# Patient Record
Sex: Female | Born: 1955 | Race: Black or African American | Hispanic: No | Marital: Married | Smoking: Never smoker
Health system: Southern US, Community
[De-identification: ages and names within clinical notes are randomized; demographics above are authoritative.]

## PROBLEM LIST (undated history)

## (undated) DIAGNOSIS — N189 Chronic kidney disease, unspecified: Secondary | ICD-10-CM

## (undated) DIAGNOSIS — Z9889 Other specified postprocedural states: Secondary | ICD-10-CM

## (undated) DIAGNOSIS — R112 Nausea with vomiting, unspecified: Secondary | ICD-10-CM

## (undated) DIAGNOSIS — R310 Gross hematuria: Secondary | ICD-10-CM

## (undated) DIAGNOSIS — Z8639 Personal history of other endocrine, nutritional and metabolic disease: Secondary | ICD-10-CM

## (undated) DIAGNOSIS — Z7682 Awaiting organ transplant status: Secondary | ICD-10-CM

## (undated) DIAGNOSIS — D631 Anemia in chronic kidney disease: Secondary | ICD-10-CM

## (undated) DIAGNOSIS — I2510 Atherosclerotic heart disease of native coronary artery without angina pectoris: Secondary | ICD-10-CM

## (undated) DIAGNOSIS — N200 Calculus of kidney: Secondary | ICD-10-CM

## (undated) DIAGNOSIS — Z992 Dependence on renal dialysis: Secondary | ICD-10-CM

## (undated) DIAGNOSIS — E890 Postprocedural hypothyroidism: Secondary | ICD-10-CM

## (undated) DIAGNOSIS — M1990 Unspecified osteoarthritis, unspecified site: Secondary | ICD-10-CM

## (undated) DIAGNOSIS — N281 Cyst of kidney, acquired: Secondary | ICD-10-CM

## (undated) DIAGNOSIS — N186 End stage renal disease: Secondary | ICD-10-CM

## (undated) DIAGNOSIS — N2581 Secondary hyperparathyroidism of renal origin: Secondary | ICD-10-CM

## (undated) DIAGNOSIS — I10 Essential (primary) hypertension: Secondary | ICD-10-CM

## (undated) HISTORY — PX: CARDIAC CATHETERIZATION: SHX172

## (undated) HISTORY — PX: INSERTION OF DIALYSIS CATHETER: SHX1324

## (undated) HISTORY — PX: NEPHRECTOMY TRANSPLANTED ORGAN: SUR880

---

## 1999-06-10 ENCOUNTER — Ambulatory Visit (HOSPITAL_COMMUNITY): Admission: RE | Admit: 1999-06-10 | Discharge: 1999-06-10 | Payer: Self-pay | Admitting: Emergency Medicine

## 1999-06-10 ENCOUNTER — Encounter: Payer: Self-pay | Admitting: Emergency Medicine

## 2000-05-03 ENCOUNTER — Other Ambulatory Visit: Admission: RE | Admit: 2000-05-03 | Discharge: 2000-05-03 | Payer: Self-pay | Admitting: *Deleted

## 2000-05-09 ENCOUNTER — Ambulatory Visit (HOSPITAL_COMMUNITY): Admission: RE | Admit: 2000-05-09 | Discharge: 2000-05-09 | Payer: Self-pay | Admitting: Emergency Medicine

## 2000-05-09 ENCOUNTER — Encounter: Payer: Self-pay | Admitting: Emergency Medicine

## 2000-06-08 ENCOUNTER — Ambulatory Visit (HOSPITAL_COMMUNITY): Admission: RE | Admit: 2000-06-08 | Discharge: 2000-06-08 | Payer: Self-pay | Admitting: Emergency Medicine

## 2000-06-08 ENCOUNTER — Encounter: Payer: Self-pay | Admitting: Emergency Medicine

## 2004-11-10 ENCOUNTER — Encounter: Admission: RE | Admit: 2004-11-10 | Discharge: 2004-11-10 | Payer: Self-pay | Admitting: Emergency Medicine

## 2004-11-14 ENCOUNTER — Encounter: Admission: RE | Admit: 2004-11-14 | Discharge: 2004-11-14 | Payer: Self-pay | Admitting: Emergency Medicine

## 2005-02-02 ENCOUNTER — Encounter: Admission: RE | Admit: 2005-02-02 | Discharge: 2005-02-02 | Payer: Self-pay | Admitting: Emergency Medicine

## 2005-09-14 ENCOUNTER — Encounter: Admission: RE | Admit: 2005-09-14 | Discharge: 2005-09-14 | Payer: Self-pay | Admitting: Emergency Medicine

## 2006-05-13 ENCOUNTER — Emergency Department (HOSPITAL_COMMUNITY): Admission: EM | Admit: 2006-05-13 | Discharge: 2006-05-13 | Payer: Self-pay | Admitting: Family Medicine

## 2007-04-01 ENCOUNTER — Encounter: Admission: RE | Admit: 2007-04-01 | Discharge: 2007-04-01 | Payer: Self-pay | Admitting: Emergency Medicine

## 2007-04-10 ENCOUNTER — Encounter: Admission: RE | Admit: 2007-04-10 | Discharge: 2007-04-10 | Payer: Self-pay | Admitting: Emergency Medicine

## 2007-07-27 ENCOUNTER — Emergency Department (HOSPITAL_COMMUNITY): Admission: EM | Admit: 2007-07-27 | Discharge: 2007-07-27 | Payer: Self-pay | Admitting: Emergency Medicine

## 2007-11-04 ENCOUNTER — Emergency Department (HOSPITAL_COMMUNITY): Admission: EM | Admit: 2007-11-04 | Discharge: 2007-11-04 | Payer: Self-pay | Admitting: Emergency Medicine

## 2008-06-22 ENCOUNTER — Emergency Department (HOSPITAL_COMMUNITY): Admission: EM | Admit: 2008-06-22 | Discharge: 2008-06-22 | Payer: Self-pay | Admitting: Nephrology

## 2008-11-30 ENCOUNTER — Encounter: Payer: Self-pay | Admitting: Gynecology

## 2008-11-30 ENCOUNTER — Ambulatory Visit: Payer: Self-pay | Admitting: Gynecology

## 2008-11-30 ENCOUNTER — Other Ambulatory Visit: Admission: RE | Admit: 2008-11-30 | Discharge: 2008-11-30 | Payer: Self-pay | Admitting: Gynecology

## 2008-12-01 ENCOUNTER — Encounter: Admission: RE | Admit: 2008-12-01 | Discharge: 2008-12-01 | Payer: Self-pay | Admitting: Family Medicine

## 2008-12-08 ENCOUNTER — Ambulatory Visit: Payer: Self-pay | Admitting: Gynecology

## 2008-12-15 ENCOUNTER — Ambulatory Visit: Payer: Self-pay | Admitting: Gynecology

## 2008-12-24 ENCOUNTER — Emergency Department (HOSPITAL_COMMUNITY): Admission: EM | Admit: 2008-12-24 | Discharge: 2008-12-24 | Payer: Self-pay | Admitting: Emergency Medicine

## 2009-03-17 ENCOUNTER — Ambulatory Visit: Payer: Self-pay | Admitting: Oncology

## 2009-04-13 ENCOUNTER — Other Ambulatory Visit: Admission: RE | Admit: 2009-04-13 | Discharge: 2009-04-13 | Payer: Self-pay | Admitting: Oncology

## 2009-04-13 ENCOUNTER — Encounter: Payer: Self-pay | Admitting: Oncology

## 2009-04-13 LAB — MORPHOLOGY - CHCC SATELLITE

## 2009-04-13 LAB — CBC WITH DIFFERENTIAL (CANCER CENTER ONLY)
EOS%: 2.9 % (ref 0.0–7.0)
HCT: 35.3 % (ref 34.8–46.6)
LYMPH#: 4.3 10*3/uL — ABNORMAL HIGH (ref 0.9–3.3)
LYMPH%: 47.5 % (ref 14.0–48.0)
MCH: 26.7 pg (ref 26.0–34.0)
MCV: 79 fL — ABNORMAL LOW (ref 81–101)
NEUT%: 44.3 % (ref 39.6–80.0)
Platelets: 399 10*3/uL (ref 145–400)
WBC: 9.1 10*3/uL (ref 3.9–10.0)

## 2009-04-13 LAB — CMP (CANCER CENTER ONLY)
ALT(SGPT): 15 U/L (ref 10–47)
CO2: 31 mEq/L (ref 18–33)
Calcium: 9.5 mg/dL (ref 8.0–10.3)
Total Bilirubin: 0.5 mg/dl (ref 0.20–1.60)

## 2009-04-14 LAB — COMPREHENSIVE METABOLIC PANEL
ALT: 10 U/L (ref 0–35)
AST: 14 U/L (ref 0–37)
BUN: 16 mg/dL (ref 6–23)
CO2: 23 mEq/L (ref 19–32)
Calcium: 10.3 mg/dL (ref 8.4–10.5)
Chloride: 103 mEq/L (ref 96–112)
Potassium: 4.5 mEq/L (ref 3.5–5.3)
Sodium: 141 mEq/L (ref 135–145)

## 2009-04-15 LAB — PROTEIN ELECTROPHORESIS, SERUM
Alpha-1-Globulin: 4.5 % (ref 2.9–4.9)
Beta 2: 7.3 % — ABNORMAL HIGH (ref 3.2–6.5)
Gamma Globulin: 19.4 % — ABNORMAL HIGH (ref 11.1–18.8)

## 2009-04-15 LAB — IRON AND TIBC
Iron: <10 ug/dL — ABNORMAL LOW (ref 42–145)
UIBC: 267 ug/dL

## 2009-04-15 LAB — FERRITIN: Ferritin: 59 ng/mL (ref 10–291)

## 2009-04-15 LAB — ERYTHROPOIETIN: Erythropoietin: 9.5 m[IU]/mL (ref 2.6–34.0)

## 2009-04-27 ENCOUNTER — Ambulatory Visit: Payer: Self-pay | Admitting: Oncology

## 2009-05-03 LAB — CBC WITH DIFFERENTIAL (CANCER CENTER ONLY)
EOS%: 1.8 % (ref 0.0–7.0)
Eosinophils Absolute: 0.2 10*3/uL (ref 0.0–0.5)
LYMPH#: 3.7 10*3/uL — ABNORMAL HIGH (ref 0.9–3.3)
LYMPH%: 44.5 % (ref 14.0–48.0)
MONO%: 6 % (ref 0.0–13.0)
NEUT#: 3.9 10*3/uL (ref 1.5–6.5)
NEUT%: 46.7 % (ref 39.6–80.0)
WBC: 8.4 10*3/uL (ref 3.9–10.0)

## 2009-05-03 LAB — TECHNOLOGIST REVIEW CHCC SATELLITE

## 2009-08-09 ENCOUNTER — Emergency Department (HOSPITAL_COMMUNITY): Admission: EM | Admit: 2009-08-09 | Discharge: 2009-08-09 | Payer: Self-pay | Admitting: Emergency Medicine

## 2009-08-19 ENCOUNTER — Ambulatory Visit: Payer: Self-pay | Admitting: Gynecology

## 2009-11-21 ENCOUNTER — Emergency Department (HOSPITAL_COMMUNITY): Admission: EM | Admit: 2009-11-21 | Discharge: 2009-11-21 | Payer: Self-pay | Admitting: Emergency Medicine

## 2009-11-25 ENCOUNTER — Ambulatory Visit: Payer: Self-pay | Admitting: Oncology

## 2009-11-30 LAB — CBC WITH DIFFERENTIAL (CANCER CENTER ONLY)
BASO#: 0.1 10*3/uL (ref 0.0–0.2)
EOS%: 3.3 % (ref 0.0–7.0)
Eosinophils Absolute: 0.4 10*3/uL (ref 0.0–0.5)
HCT: 37.2 % (ref 34.8–46.6)
HGB: 12.5 g/dL (ref 11.6–15.9)
LYMPH%: 51 % — ABNORMAL HIGH (ref 14.0–48.0)
MONO%: 5.5 % (ref 0.0–13.0)
NEUT%: 39.1 % — ABNORMAL LOW (ref 39.6–80.0)
RBC: 4.52 10*6/uL (ref 3.70–5.32)

## 2009-11-30 LAB — FERRITIN: Ferritin: 104 ng/mL (ref 10–291)

## 2010-03-10 ENCOUNTER — Ambulatory Visit
Admission: RE | Admit: 2010-03-10 | Discharge: 2010-03-10 | Payer: Self-pay | Source: Home / Self Care | Admitting: Orthopedic Surgery

## 2010-03-10 HISTORY — PX: WRIST ARTHROSCOPY W/ TRIANGULAR FIBROCARTILAGE REPAIR: SHX2670

## 2010-05-20 ENCOUNTER — Ambulatory Visit: Payer: Self-pay | Admitting: Oncology

## 2010-07-01 ENCOUNTER — Ambulatory Visit: Payer: Self-pay | Admitting: Oncology

## 2010-09-25 ENCOUNTER — Encounter: Payer: Self-pay | Admitting: Emergency Medicine

## 2010-11-20 LAB — POCT HEMOGLOBIN-HEMACUE: Hemoglobin: 14.1 g/dL (ref 12.0–15.0)

## 2010-12-07 ENCOUNTER — Other Ambulatory Visit: Payer: Self-pay | Admitting: Internal Medicine

## 2010-12-08 ENCOUNTER — Other Ambulatory Visit: Payer: Self-pay

## 2011-06-13 LAB — CBC
HCT: 33.7 — ABNORMAL LOW
Hemoglobin: 11.8 — ABNORMAL LOW
MCHC: 35
MCV: 79
RBC: 4.26
RDW: 13.7

## 2011-06-13 LAB — DIFFERENTIAL
Basophils Absolute: 0.1
Basophils Relative: 1
Eosinophils Relative: 3
Monocytes Absolute: 0.7
Monocytes Relative: 8

## 2011-06-13 LAB — COMPREHENSIVE METABOLIC PANEL
AST: 16
Albumin: 3.5
Alkaline Phosphatase: 94
CO2: 28
Glucose, Bld: 94
Total Bilirubin: 0.7

## 2011-06-13 LAB — URINALYSIS, ROUTINE W REFLEX MICROSCOPIC
Ketones, ur: NEGATIVE
Leukocytes, UA: NEGATIVE
Nitrite: NEGATIVE
Protein, ur: 30 — AB

## 2011-06-13 LAB — URINE MICROSCOPIC-ADD ON

## 2012-09-05 ENCOUNTER — Emergency Department (INDEPENDENT_AMBULATORY_CARE_PROVIDER_SITE_OTHER)
Admission: EM | Admit: 2012-09-05 | Discharge: 2012-09-05 | Disposition: A | Discharge status: Home / Self Care | Payer: 59 | Source: Home / Self Care

## 2012-09-05 ENCOUNTER — Encounter (HOSPITAL_COMMUNITY): Payer: Self-pay

## 2012-09-05 DIAGNOSIS — R0982 Postnasal drip: Secondary | ICD-10-CM

## 2012-09-05 DIAGNOSIS — J029 Acute pharyngitis, unspecified: Secondary | ICD-10-CM

## 2012-09-05 NOTE — ED Provider Notes (Signed)
History     CSN: [id number]  Arrival date & time 09/05/12  1135   None     Chief Complaint  Patient presents with  . Sore Throat    (Consider location/radiation/quality/duration/timing/severity/associated sxs/prior treatment) HPI Comments: 57 year old female presents with sore throat for one week. The pain is primarily the right aspect of her throat. She has a lot of clearing of the throat but does not perceive any significant PND. She denies fever or earache. Denies nasal congestion, shortness of breath or chest pain.   Past Medical History  Diagnosis Date  . Thyroid disease     History reviewed. No pertinent past surgical history.  History reviewed. No pertinent family history.  History  Substance Use Topics  . Smoking status: Not on file  . Smokeless tobacco: Not on file  . Alcohol Use:     OB History    Grav Para Term Preterm Abortions TAB SAB Ect Mult Living                  Review of Systems  Constitutional: Negative for fever, chills, activity change, appetite change and fatigue.  HENT: Positive for sore throat. Negative for congestion, facial swelling, rhinorrhea, trouble swallowing, neck pain, neck stiffness and postnasal drip.   Eyes: Negative.   Respiratory: Negative.  Negative for cough.   Cardiovascular: Negative.   Gastrointestinal: Negative.   Skin: Negative for pallor and rash.  Neurological: Negative.   Psychiatric/Behavioral: Negative.     Allergies  Aspirin  Home Medications   Current Outpatient Rx  Name  Route  Sig  Dispense  Refill  . THYROID 180 MG PO TABS   Oral   Take 180 mg by mouth daily.           BP 144/76  Pulse 62  Temp 98 F (36.7 C) (Oral)  Resp 14  SpO2 99%  Physical Exam  Nursing note and vitals reviewed. Constitutional: She is oriented to person, place, and time. She appears well-developed and well-nourished. No distress.  HENT:       Bilateral TMs are normal Oropharynx without erythema or edema. There  are bilateral moderately enlarged pink palatine tonsils that are not especially cryptic, erythematous or with exudates.  Neck: Normal range of motion. Neck supple.  Cardiovascular: Normal rate and regular rhythm.   Pulmonary/Chest: Effort normal and breath sounds normal. No respiratory distress. She has no wheezes. She has no rales.  Musculoskeletal: Normal range of motion. She exhibits no edema.  Lymphadenopathy:    She has no cervical adenopathy.  Neurological: She is alert and oriented to person, place, and time.  Skin: Skin is warm and dry. No rash noted.  Psychiatric: She has a normal mood and affect.    ED Course  Procedures (including critical care time)  Labs Reviewed - No data to display No results found.   1. Pharyngitis   2. PND (post-nasal drip)       MDM  Oropharynx is benign, airway widely patent. I suspect her pharyngitis is primarily due to PND greatest on the right side of her throat. Recommend Claritin 10 mg a day when necessary for drainage. May also take Tylenol as needed for discomfort. Drink plenty of fluids and stay well-hydrated. Cepacol lozenges when necessary sore throat. Recheck for new symptoms problems or worsening such as fever, chills or worsening pain or trouble swallowing or breathing.         Janne Napoleon, NP 09/05/12 1343

## 2012-09-05 NOTE — ED Notes (Signed)
C/o sore throat, R>L since 08-30-2012; NAD, minimal relief w OTC medications; throat minimally red, no uvula deviation on inspection

## 2012-09-05 NOTE — ED Provider Notes (Signed)
Medical screening examination/treatment/procedure(s) were performed by non-physician practitioner and as supervising physician I was immediately available for consultation/collaboration.  Philipp Deputy, M.D.   Harden Mo, MD 09/05/12 [phone]

## 2013-09-19 ENCOUNTER — Other Ambulatory Visit: Payer: Self-pay | Admitting: Internal Medicine

## 2013-09-19 DIAGNOSIS — Z1231 Encounter for screening mammogram for malignant neoplasm of breast: Secondary | ICD-10-CM

## 2013-10-20 ENCOUNTER — Encounter (HOSPITAL_COMMUNITY): Payer: Self-pay | Admitting: Emergency Medicine

## 2013-10-20 ENCOUNTER — Emergency Department (INDEPENDENT_AMBULATORY_CARE_PROVIDER_SITE_OTHER)
Admission: EM | Admit: 2013-10-20 | Discharge: 2013-10-20 | Disposition: A | Discharge status: Home / Self Care | Payer: Self-pay | Source: Home / Self Care | Attending: Emergency Medicine | Admitting: Emergency Medicine

## 2013-10-20 DIAGNOSIS — N289 Disorder of kidney and ureter, unspecified: Secondary | ICD-10-CM

## 2013-10-20 DIAGNOSIS — R319 Hematuria, unspecified: Secondary | ICD-10-CM

## 2013-10-20 LAB — POCT I-STAT, CHEM 8
BUN: 28 mg/dL — ABNORMAL HIGH (ref 6–23)
CHLORIDE: 104 meq/L (ref 96–112)
CREATININE: 1.6 mg/dL — AB (ref 0.50–1.10)
Calcium, Ion: 1.3 mmol/L — ABNORMAL HIGH (ref 1.12–1.23)
GLUCOSE: 90 mg/dL (ref 70–99)
HCT: 38 % (ref 36.0–46.0)
Hemoglobin: 12.9 g/dL (ref 12.0–15.0)
POTASSIUM: 4.8 meq/L (ref 3.7–5.3)
Sodium: 141 mEq/L (ref 137–147)
TCO2: 30 mmol/L (ref 0–100)

## 2013-10-20 LAB — URINE MICROSCOPIC-ADD ON

## 2013-10-20 LAB — POCT URINALYSIS DIP (DEVICE)
BILIRUBIN URINE: NEGATIVE
GLUCOSE, UA: NEGATIVE mg/dL
Ketones, ur: NEGATIVE mg/dL
Leukocytes, UA: NEGATIVE
Nitrite: NEGATIVE
Protein, ur: >=300 mg/dL — AB
SPECIFIC GRAVITY, URINE: 1.015 (ref 1.005–1.030)
UROBILINOGEN UA: 0.2 mg/dL (ref 0.0–1.0)
pH: 5.5 (ref 5.0–8.0)

## 2013-10-20 LAB — URINALYSIS, ROUTINE W REFLEX MICROSCOPIC
Bilirubin Urine: NEGATIVE
GLUCOSE, UA: NEGATIVE mg/dL
Ketones, ur: NEGATIVE mg/dL
LEUKOCYTES UA: NEGATIVE
Nitrite: NEGATIVE
PROTEIN: 100 mg/dL — AB
SPECIFIC GRAVITY, URINE: 1.013 (ref 1.005–1.030)
UROBILINOGEN UA: 0.2 mg/dL (ref 0.0–1.0)
pH: 5.5 (ref 5.0–8.0)

## 2013-10-20 MED ORDER — HYDROCODONE-ACETAMINOPHEN 5-325 MG PO TABS: 2.0000 | ORAL_TABLET | ORAL | Status: DC | PRN

## 2013-10-20 NOTE — ED Provider Notes (Signed)
CSN: [id number]     Arrival date & time 10/20/13  0801 History   First MD Initiated Contact with Patient 10/20/13 [phone]     Chief Complaint  Patient presents with  . Back Pain     (Consider location/radiation/quality/duration/timing/severity/associated sxs/prior Treatment) Patient is a 58 y.o. female presenting with back pain. The history is provided by the patient. No language interpreter was used.  Back Pain Location:  Lumbar spine Quality:  Aching Radiates to:  Does not radiate Pain severity:  Moderate Pain is:  Same all the time Onset quality:  Gradual Timing:  Constant Progression:  Worsening Chronicity:  New Relieved by:  Nothing Worsened by:  Nothing tried Ineffective treatments:  None tried Associated symptoms: abdominal pain    Pt complains of pain in right low back.   Past Medical History  Diagnosis Date  . Thyroid disease    History reviewed. No pertinent past surgical history. History reviewed. No pertinent family history. History  Substance Use Topics  . Smoking status: Never Smoker   . Smokeless tobacco: Not on file  . Alcohol Use: No   OB History   Grav Para Term Preterm Abortions TAB SAB Ect Mult Living                 Review of Systems  Gastrointestinal: Positive for abdominal pain.  Musculoskeletal: Positive for back pain.  All other systems reviewed and are negative.      Allergies  Aspirin  Home Medications   Current Outpatient Rx  Name  Route  Sig  Dispense  Refill  . thyroid (ARMOUR) 180 MG tablet   Oral   Take 180 mg by mouth daily.          BP 145/93  Pulse 70  Temp(Src) 97.8 F (36.6 C) (Oral)  Resp 18  SpO2 98% Physical Exam  Nursing note and vitals reviewed. Constitutional: She is oriented to person, place, and time. She appears well-developed and well-nourished.  HENT:  Head: Normocephalic.  Eyes: EOM are normal.  Neck: Normal range of motion.  Pulmonary/Chest: Effort normal and breath sounds normal.   Abdominal: She exhibits no distension. There is tenderness.  Musculoskeletal: Normal range of motion.  Neurological: She is alert and oriented to person, place, and time.  Skin: Skin is warm.  Psychiatric: She has a normal mood and affect.    ED Course  Procedures (including critical care time) Labs Review Labs Reviewed  POCT URINALYSIS DIP (DEVICE) - Abnormal; Notable for the following:    Hgb urine dipstick MODERATE (*)    Protein, ur >=300 (*)    All other components within normal limits   Imaging Review No results found.    MDM   Final diagnoses:  Hematuria  Renal insufficiency    Bun 28, creat 1.60  Elevated calcium.    Pt advised to see her primary Md.   Pt advised to schedule to see urology for evaluation.   Pt given rx for hydrocodone for pain.      [place], PA-C 10/20/13 1058

## 2013-10-20 NOTE — ED Provider Notes (Signed)
Medical screening examination/treatment/procedure(s) were performed by non-physician practitioner and as supervising physician I was immediately available for consultation/collaboration.  Philipp Deputy, M.D.  Harden Mo, MD 10/20/13 [phone]

## 2013-10-20 NOTE — Discharge Instructions (Signed)
Hematuria, Adult °Hematuria is blood in your urine. It can be caused by a bladder infection, kidney infection, prostate infection, kidney stone, or cancer of your urinary tract. Infections can usually be treated with medicine, and a kidney stone usually will pass through your urine. If neither of these is the cause of your hematuria, further workup to find out the reason may be needed. °It is very important that you tell your health care provider about any blood you see in your urine, even if the blood stops without treatment or happens without causing pain. Blood in your urine that happens and then stops and then happens again can be a symptom of a very serious condition. Also, pain is not a symptom in the initial stages of many urinary cancers. °HOME CARE INSTRUCTIONS  °· Drink lots of fluid, 3 4 quarts a day. If you have been diagnosed with an infection, cranberry juice is especially recommended, in addition to large amounts of water. °· Avoid caffeine, tea, and carbonated beverages, because they tend to irritate the bladder. °· Avoid alcohol because it may irritate the prostate. °· Only take over-the-counter or prescription medicines for pain, discomfort, or fever as directed by your health care provider. °· If you have been diagnosed with a kidney stone, follow your health care provider's instructions regarding straining your urine to catch the stone. °· Empty your bladder often. Avoid holding urine for long periods of time. °· After a bowel movement, women should cleanse front to back. Use each tissue only once. °· Empty your bladder before and after sexual intercourse if you are a female. °SEEK MEDICAL CARE IF: °You develop back pain, fever, a feeling of sickness in your stomach (nausea), or vomiting or if your symptoms are not better in 3 days. Return sooner if you are getting worse. °SEEK IMMEDIATE MEDICAL CARE IF:  °· You have a persistent fever, with a temperature of 101.8°F (38.8°C) or greater. °· You  develop severe vomiting and are unable to keep the medicine down. °· You develop severe back or abdominal pain despite taking your medicines. °· You begin passing a large amount of blood or clots in your urine. °· You feel extremely weak or faint, or you pass out. °MAKE SURE YOU:  °· Understand these instructions. °· Will watch your condition. °· Will get help right away if you are not doing well or get worse. °Document Released: 08/21/2005 Document Revised: 06/11/2013 Document Reviewed: 04/21/2013 °[employer]® Patient Information ©2014 [employer]. ° °

## 2013-10-20 NOTE — ED Notes (Signed)
Pt  Reports  Symptoms  Of   Low  Back  Pain    X  sev  Days     denys  Any  Injury   Ambulated  To  Room  -  Is  Sitting  Upright on  Exam table  Speaking  In  Complete    sentances            Sitting upright on  Exam table  In  Complete  sentances

## 2013-10-21 LAB — URINE CULTURE

## 2014-04-16 ENCOUNTER — Other Ambulatory Visit (HOSPITAL_COMMUNITY): Payer: Self-pay | Admitting: Nephrology

## 2014-04-16 DIAGNOSIS — R809 Proteinuria, unspecified: Secondary | ICD-10-CM

## 2014-04-16 DIAGNOSIS — N183 Chronic kidney disease, stage 3 unspecified: Secondary | ICD-10-CM

## 2014-04-21 ENCOUNTER — Encounter (HOSPITAL_COMMUNITY): Payer: Self-pay | Admitting: Pharmacy Technician

## 2014-04-27 ENCOUNTER — Ambulatory Visit (HOSPITAL_COMMUNITY): Payer: Self-pay

## 2014-04-28 ENCOUNTER — Other Ambulatory Visit: Payer: Self-pay | Admitting: Radiology

## 2014-05-01 ENCOUNTER — Ambulatory Visit (HOSPITAL_COMMUNITY): Admission: RE | Admit: 2014-05-01 | Payer: Self-pay | Source: Ambulatory Visit

## 2014-06-27 ENCOUNTER — Emergency Department (HOSPITAL_COMMUNITY)
Admission: EM | Admit: 2014-06-27 | Discharge: 2014-06-27 | Disposition: A | Discharge status: Home / Self Care | Payer: Self-pay | Attending: Emergency Medicine | Admitting: Emergency Medicine

## 2014-06-27 ENCOUNTER — Encounter (HOSPITAL_COMMUNITY): Payer: Self-pay | Admitting: Emergency Medicine

## 2014-06-27 DIAGNOSIS — N183 Chronic kidney disease, stage 3 unspecified: Secondary | ICD-10-CM

## 2014-06-27 DIAGNOSIS — I160 Hypertensive urgency: Secondary | ICD-10-CM

## 2014-06-27 DIAGNOSIS — Z79899 Other long term (current) drug therapy: Secondary | ICD-10-CM | POA: Insufficient documentation

## 2014-06-27 DIAGNOSIS — E079 Disorder of thyroid, unspecified: Secondary | ICD-10-CM | POA: Insufficient documentation

## 2014-06-27 DIAGNOSIS — I129 Hypertensive chronic kidney disease with stage 1 through stage 4 chronic kidney disease, or unspecified chronic kidney disease: Secondary | ICD-10-CM | POA: Insufficient documentation

## 2014-06-27 LAB — I-STAT CHEM 8, ED
BUN: 27 mg/dL — ABNORMAL HIGH (ref 6–23)
CHLORIDE: 109 meq/L (ref 96–112)
Calcium, Ion: 1.09 mmol/L — ABNORMAL LOW (ref 1.12–1.23)
Creatinine, Ser: 1.8 mg/dL — ABNORMAL HIGH (ref 0.50–1.10)
Glucose, Bld: 90 mg/dL (ref 70–99)
HEMATOCRIT: 37 % (ref 36.0–46.0)
Hemoglobin: 12.6 g/dL (ref 12.0–15.0)
POTASSIUM: 4.9 meq/L (ref 3.7–5.3)
SODIUM: 138 meq/L (ref 137–147)
TCO2: 22 mmol/L (ref 0–100)

## 2014-06-27 MED ORDER — AMLODIPINE BESYLATE 5 MG PO TABS
5.0000 mg | ORAL_TABLET | Freq: Once | ORAL | Status: AC
  Administered 2014-06-27: 5 mg via ORAL
  Filled 2014-06-27: qty 1

## 2014-06-27 MED ORDER — SODIUM CHLORIDE 0.9 % IV BOLUS (SEPSIS)
1000.0000 mL | Freq: Once | INTRAVENOUS | Status: AC
  Administered 2014-06-27: 1000 mL via INTRAVENOUS

## 2014-06-27 MED ORDER — METOCLOPRAMIDE HCL 5 MG/ML IJ SOLN
10.0000 mg | Freq: Once | INTRAMUSCULAR | Status: AC
  Administered 2014-06-27: 10 mg via INTRAVENOUS
  Filled 2014-06-27: qty 2

## 2014-06-27 MED ORDER — AMLODIPINE BESYLATE 5 MG PO TABS: 5.0000 mg | ORAL_TABLET | Freq: Every day | ORAL | Status: DC

## 2014-06-27 MED ORDER — MAGNESIUM SULFATE 40 MG/ML IJ SOLN
2.0000 g | Freq: Once | INTRAMUSCULAR | Status: AC
Start: 2014-06-27 — End: 2014-06-27
  Administered 2014-06-27: 2 g via INTRAVENOUS
  Filled 2014-06-27: qty 50

## 2014-06-27 NOTE — ED Notes (Signed)
She states that some 4 months ago she was found to have abnormal kidney function per lab work.  She was subsequently referred to nephrology, who has been tracking her creatinine, which has been rising.  She further states that she is awaiting her b/p to stabilize so she may undergo a kidney biopsy.  She is here today with c/o headache "that wouldn't go away this time".  She is in no distress and is oriented x 4 with clear speech.  PERRLA at 7mm.

## 2014-06-27 NOTE — ED Provider Notes (Signed)
CSN: [id number]     Arrival date & time 06/27/14  1637 History   First MD Initiated Contact with Patient 06/27/14 1857     Chief Complaint  Patient presents with  . Headache     (Consider location/radiation/quality/duration/timing/severity/associated sxs/prior Treatment) HPI Comments: Pt comes in with cc of elevated BP and headaches. Pt reports that her headache - described as posterior pressure, is moderate but constant. She has had these headaches in the past, they usually go away, but today they haven't. She was advised not to take any pain meds, thus she hasn't. No nausea, vomiting, visual complains, seizures, altered mental status, loss of consciousness, new weakness, or numbness, no gait instability. Pt's BP is elevated. She was started on Bp meds 3 weeks ago, and is taking losartan. Pt has no numbness, tingling, weakness, chest pain, headaches, visual complains.   Patient is a 58 y.o. female presenting with headaches. The history is provided by the patient.  Headache Associated symptoms: no abdominal pain, no nausea, no neck pain and no vomiting     Past Medical History  Diagnosis Date  . Thyroid disease   . Creatinine elevation    No past surgical history on file. No family history on file. History  Substance Use Topics  . Smoking status: Never Smoker   . Smokeless tobacco: Not on file  . Alcohol Use: No   OB History   Grav Para Term Preterm Abortions TAB SAB Ect Mult Living                 Review of Systems  Constitutional: Negative for activity change.  Respiratory: Negative for shortness of breath.   Cardiovascular: Negative for chest pain.  Gastrointestinal: Negative for nausea, vomiting and abdominal pain.  Genitourinary: Negative for dysuria.  Musculoskeletal: Negative for neck pain.  Neurological: Positive for headaches.      Allergies  Aspirin and Tetracyclines & related  Home Medications   Prior to Admission medications   Medication Sig Start  Date End Date Taking? Authorizing Provider  B Complex Vitamins (B-COMPLEX/B-12 PO) Take 1 tablet by mouth daily.   Yes Historical Provider, MD  cholecalciferol (VITAMIN D) 1000 UNITS tablet Take 1,000 Units by mouth daily.   Yes Historical Provider, MD  ferrous sulfate 325 (65 FE) MG tablet Take 325 mg by mouth every other day.   Yes Historical Provider, MD  levothyroxine (SYNTHROID, LEVOTHROID) 175 MCG tablet Take 175 mcg by mouth daily before breakfast.   Yes Historical Provider, MD  losartan (COZAAR) 50 MG tablet Take 50 mg by mouth every evening. At 10pm.   Yes Historical Provider, MD  vitamin C (ASCORBIC ACID) 500 MG tablet Take 500 mg by mouth every other day.   Yes Historical Provider, MD  amLODipine (NORVASC) 5 MG tablet Take 1 tablet (5 mg total) by mouth daily. 06/27/14   Davian Wollenberg, MD   BP 204/95  Pulse 96  Temp(Src) 98.7 F (37.1 C) (Oral)  Resp 20  SpO2 97% Physical Exam  Nursing note and vitals reviewed. Constitutional: She is oriented to person, place, and time. She appears well-developed and well-nourished.  HENT:  Head: Normocephalic and atraumatic.  Eyes: EOM are normal. Pupils are equal, round, and reactive to light.  Neck: Neck supple. No JVD present.  Cardiovascular: Normal rate, regular rhythm and normal heart sounds.   Pulmonary/Chest: Effort normal. No respiratory distress.  Abdominal: Soft. She exhibits no distension. There is no tenderness. There is no rebound and no guarding.  Neurological:  She is alert and oriented to person, place, and time. No cranial nerve deficit. Coordination normal.  Skin: Skin is warm and dry.    ED Course  Procedures (including critical care time) Labs Review Labs Reviewed  I-STAT CHEM 8, ED - Abnormal; Notable for the following:    BUN 27 (*)    Creatinine, Ser 1.80 (*)    Calcium, Ion 1.09 (*)    All other components within normal limits    Imaging Review No results found.   EKG Interpretation   Date/Time:   Saturday June 27 2014 17:26:12 EDT Ventricular Rate:  73 PR Interval:  154 QRS Duration: 80 QT Interval:  378 QTC Calculation: 416 R Axis:   21 Text Interpretation:  Sinus rhythm Nonspecific T abnrm, anterolateral  leads Minimal ST elevation, inferior leads Confirmed by Kathrynn Humble, MD,  Thelma Comp [phone]) on 06/27/2014 7:32:05 PM      MDM   Final diagnoses:  CKD (chronic kidney disease) stage 3, GFR 30-59 ml/min  Hypertensive urgency    Pt comes in with headaches. Intermittent, but now constant. Normal neuro exam. Pt got magnesium and reglan - and her headache resolved. BP is elevated. New diagnosis. Likely not optimized. Spoke with Dr. Humphrey Rolls, and he thinks adding low dose amlodipine is probably a good idea, until pt sees her PCP. Pt informed about this, she will try to see her nephrologist soon.  Varney Biles, MD 06/27/14 [phone]

## 2014-06-27 NOTE — Discharge Instructions (Signed)
We saw you in the ER for the headaches and the elevated BP. All the results in the ER are normal, except for chronic kidney disease.  The workup in the ER is not complete, and is limited to screening for life threatening and emergent conditions only, so please see a primary care doctor for further evaluation. Start the BP meds as requested. Keep a log of BP. See your doctor as soon as possible.   Chronic Kidney Disease Chronic kidney disease occurs when the kidneys are damaged over a long period. The kidneys are two organs that lie on either side of the spine between the middle of the back and the front of the abdomen. The kidneys:   Remove wastes and extra water from the blood.   Produce important hormones. These help keep bones strong, regulate blood pressure, and help create red blood cells.   Balance the fluids and chemicals in the blood and tissues. A small amount of kidney damage may not cause problems, but a large amount of damage may make it difficult or impossible for the kidneys to work the way they should. If steps are not taken to slow down the kidney damage or stop it from getting worse, the kidneys may stop working permanently. Most of the time, chronic kidney disease does not go away. However, it can often be controlled, and those with the disease can usually live normal lives. CAUSES  The most common causes of chronic kidney disease are diabetes and high blood pressure (hypertension). Chronic kidney disease may also be caused by:   Diseases that cause the kidneys' filters to become inflamed.   Diseases that affect the immune system.   Genetic diseases.   Medicines that damage the kidneys, such as anti-inflammatory medicines.  Poisoning or exposure to toxic substances.   A reoccurring kidney or urinary infection.   A problem with urine flow. This may be caused by:   Cancer.   Kidney stones.   An enlarged prostate in males. SIGNS AND SYMPTOMS  Because  the kidney damage in chronic kidney disease occurs slowly, symptoms develop slowly and may not be obvious until the kidney damage becomes severe. A person may have a kidney disease for years without showing any symptoms. Symptoms can include:   Swelling (edema) of the legs, ankles, or feet.   Tiredness (lethargy).   Nausea or vomiting.   Confusion.   Problems with urination, such as:   Decreased urine production.   Frequent urination, especially at night.   Frequent accidents in children who are potty trained.   Muscle twitches and cramps.   Shortness of breath.  Weakness.   Persistent itchiness.   Loss of appetite.  Metallic taste in the mouth.  Trouble sleeping.  Slowed development in children.  Short stature in children. DIAGNOSIS  Chronic kidney disease may be detected and diagnosed by tests, including blood, urine, imaging, or kidney biopsy tests.  TREATMENT  Most chronic kidney diseases cannot be cured. Treatment usually involves relieving symptoms and preventing or slowing the progression of the disease. Treatment may include:   A special diet. You may need to avoid alcohol and foods thatare salty and high in potassium.   Medicines. These may:   Lower blood pressure.   Relieve anemia.   Relieve swelling.   Protect the bones. HOME CARE INSTRUCTIONS   Follow your prescribed diet.   Take medicines only as directed by your health care provider. Do not take any new medicines (prescription, over-the-counter, or nutritional supplements)  unless approved by your health care provider. Many medicines can worsen your kidney damage or need to have the dose adjusted.   Quit smoking if you smoke. Talk to your health care provider about a smoking cessation program.   Keep all follow-up visits as directed by your health care provider. SEEK IMMEDIATE MEDICAL CARE IF:  Your symptoms get worse or you develop new symptoms.   You develop symptoms  of end-stage kidney disease. These include:   Headaches.   Abnormally dark or light skin.   Numbness in the hands or feet.   Easy bruising.   Frequent hiccups.   Menstruation stops.   You have a fever.   You have decreased urine production.   You havepain or bleeding when urinating. MAKE SURE YOU:  Understand these instructions.  Will watch your condition.  Will get help right away if you are not doing well or get worse. FOR MORE INFORMATION   American Association of Kidney Patients: BombTimer.gl  National Kidney Foundation: www.kidney.[place]: https://mathis.com/  [facility]: www.lifeoptions.org and www.kidneyschool.org Document Released: 05/30/2008 Document Revised: 01/05/2014 Document Reviewed: 04/19/2012 [facility] Patient Information 2015 [place], [place]. This information is not intended to replace advice given to you by your health care provider. Make sure you discuss any questions you have with your health care provider.  Hypertension Hypertension, commonly called high blood pressure, is when the force of blood pumping through your arteries is too strong. Your arteries are the blood vessels that carry blood from your heart throughout your body. A blood pressure reading consists of a higher number over a lower number, such as 110/72. The higher number (systolic) is the pressure inside your arteries when your heart pumps. The lower number (diastolic) is the pressure inside your arteries when your heart relaxes. Ideally you want your blood pressure below 120/80. Hypertension forces your heart to work harder to pump blood. Your arteries may become narrow or stiff. Having hypertension puts you at risk for heart disease, stroke, and other problems.  RISK FACTORS Some risk factors for high blood pressure are controllable. Others are not.  Risk factors you cannot control include:   Race. You may be at higher risk if you are  African American.  Age. Risk increases with age.  Gender. Men are at higher risk than women before age 32 years. After age 33, women are at higher risk than men. Risk factors you can control include:  Not getting enough exercise or physical activity.  Being overweight.  Getting too much fat, sugar, calories, or salt in your diet.  Drinking too much alcohol. SIGNS AND SYMPTOMS Hypertension does not usually cause signs or symptoms. Extremely high blood pressure (hypertensive crisis) may cause headache, anxiety, shortness of breath, and nosebleed. DIAGNOSIS  To check if you have hypertension, your health care provider will measure your blood pressure while you are seated, with your arm held at the level of your heart. It should be measured at least twice using the same arm. Certain conditions can cause a difference in blood pressure between your right and left arms. A blood pressure reading that is higher than normal on one occasion does not mean that you need treatment. If one blood pressure reading is high, ask your health care provider about having it checked again. TREATMENT  Treating high blood pressure includes making lifestyle changes and possibly taking medicine. Living a healthy lifestyle can help lower high blood pressure. You may need to change some of your habits.  Lifestyle changes may include:  Following the DASH diet. This diet is high in fruits, vegetables, and whole grains. It is low in salt, red meat, and added sugars.  Getting at least 2 hours of brisk physical activity every week.  Losing weight if necessary.  Not smoking.  Limiting alcoholic beverages.  Learning ways to reduce stress. If lifestyle changes are not enough to get your blood pressure under control, your health care provider may prescribe medicine. You may need to take more than one. Work closely with your health care provider to understand the risks and benefits. HOME CARE INSTRUCTIONS  Have your  blood pressure rechecked as directed by your health care provider.   Take medicines only as directed by your health care provider. Follow the directions carefully. Blood pressure medicines must be taken as prescribed. The medicine does not work as well when you skip doses. Skipping doses also puts you at risk for problems.   Do not smoke.   Monitor your blood pressure at home as directed by your health care provider. SEEK MEDICAL CARE IF:   You think you are having a reaction to medicines taken.  You have recurrent headaches or feel dizzy.  You have swelling in your ankles.  You have trouble with your vision. SEEK IMMEDIATE MEDICAL CARE IF:  You develop a severe headache or confusion.  You have unusual weakness, numbness, or feel faint.  You have severe chest or abdominal pain.  You vomit repeatedly.  You have trouble breathing. MAKE SURE YOU:   Understand these instructions.  Will watch your condition.  Will get help right away if you are not doing well or get worse. Document Released: 08/21/2005 Document Revised: 01/05/2014 Document Reviewed: 06/13/2013 [facility] Patient Information 2015 [place], [place]. This information is not intended to replace advice given to you by your health care provider. Make sure you discuss any questions you have with your health care provider.  How to Take Your Blood Pressure HOW DO I GET A BLOOD PRESSURE MACHINE?  You can buy an electronic home blood pressure machine at your local pharmacy. Insurance will sometimes cover the cost if you have a prescription.  Ask your doctor what type of machine is best for you. There are different machines for your arm and your wrist.  If you decide to buy a machine to check your blood pressure on your arm, first check the size of your arm so you can buy the right size cuff. To check the size of your arm:   Use a measuring tape that shows both inches and centimeters.   Wrap the measuring tape around  the upper-middle part of your arm. You may need someone to help you measure.   Write down your arm measurement in both inches and centimeters.   To measure your blood pressure correctly, it is important to have the right size cuff.   If your arm is up to 13 inches (up to 34 centimeters), get an adult cuff size.  If your arm is 13 to 17 inches (35 to 44 centimeters), get a large adult cuff size.    If your arm is 17 to 20 inches (45 to 52 centimeters), get an adult thigh cuff.  WHAT DO THE NUMBERS MEAN?   There are two numbers that make up your blood pressure. For example: 120/80.  The first number (120 in our example) is called the "systolic pressure." It is a measure of the pressure in your blood vessels when your heart is  pumping blood.  The second number (80 in our example) is called the "diastolic pressure." It is a measure of the pressure in your blood vessels when your heart is resting between beats.  Your doctor will tell you what your blood pressure should be. WHAT SHOULD I DO BEFORE I CHECK MY BLOOD PRESSURE?   Try to rest or relax for at least 30 minutes before you check your blood pressure.  Do not smoke.  Do not have any drinks with caffeine, such as:  Soda.  Coffee.  Tea.  Check your blood pressure in a quiet room.  Sit down and stretch out your arm on a table. Keep your arm at about the level of your heart. Let your arm relax.  Make sure that your legs are not crossed. HOW DO I CHECK MY BLOOD PRESSURE?  Follow the directions that came with your machine.  Make sure you remove any tight-fighting clothing from your arm or wrist. Wrap the cuff around your upper arm or wrist. You should be able to fit a finger between the cuff and your arm. If you cannot fit a finger between the cuff and your arm, it is too tight and should be removed and rewrapped.  Some units require you to manually pump up the arm cuff.  Automatic units inflate the cuff when you press a  button.  Cuff deflation is automatic in both models.  After the cuff is inflated, the unit measures your blood pressure and pulse. The readings are shown on a monitor. Hold still and breathe normally while the cuff is inflated.  Getting a reading takes less than a minute.  Some models store readings in a memory. Some provide a printout of readings. If your machine does not store your readings, keep a written record.  Take readings with you to your next visit with your doctor. Document Released: 08/03/2008 Document Revised: 01/05/2014 Document Reviewed: 10/16/2013 [facility] Patient Information 2015 [place], [place]. This information is not intended to replace advice given to you by your health care provider. Make sure you discuss any questions you have with your health care provider.

## 2015-02-15 ENCOUNTER — Other Ambulatory Visit: Payer: Self-pay

## 2015-02-15 DIAGNOSIS — Z1231 Encounter for screening mammogram for malignant neoplasm of breast: Secondary | ICD-10-CM

## 2015-03-18 ENCOUNTER — Ambulatory Visit
Admission: RE | Admit: 2015-03-18 | Discharge: 2015-03-18 | Disposition: A | Discharge status: Home / Self Care | Payer: 59 | Source: Ambulatory Visit

## 2015-03-18 DIAGNOSIS — Z1231 Encounter for screening mammogram for malignant neoplasm of breast: Secondary | ICD-10-CM

## 2015-03-22 ENCOUNTER — Other Ambulatory Visit: Payer: Self-pay | Admitting: Nurse Practitioner

## 2015-03-22 DIAGNOSIS — R928 Other abnormal and inconclusive findings on diagnostic imaging of breast: Secondary | ICD-10-CM

## 2015-03-25 NOTE — Discharge Instructions (Signed)

## 2015-03-26 ENCOUNTER — Encounter (HOSPITAL_COMMUNITY)
Admission: RE | Admit: 2015-03-26 | Discharge: 2015-03-26 | Disposition: A | Discharge status: Home / Self Care | Payer: 59 | Source: Ambulatory Visit | Attending: Nephrology | Admitting: Nephrology

## 2015-03-26 DIAGNOSIS — N184 Chronic kidney disease, stage 4 (severe): Secondary | ICD-10-CM | POA: Diagnosis not present

## 2015-03-26 DIAGNOSIS — Z79899 Other long term (current) drug therapy: Secondary | ICD-10-CM | POA: Diagnosis not present

## 2015-03-26 DIAGNOSIS — Z5181 Encounter for therapeutic drug level monitoring: Secondary | ICD-10-CM | POA: Insufficient documentation

## 2015-03-26 DIAGNOSIS — D631 Anemia in chronic kidney disease: Secondary | ICD-10-CM | POA: Insufficient documentation

## 2015-03-26 LAB — POCT HEMOGLOBIN-HEMACUE: Hemoglobin: 8.4 g/dL — ABNORMAL LOW (ref 12.0–15.0)

## 2015-03-26 MED ORDER — EPOETIN ALFA 10000 UNIT/ML IJ SOLN
INTRAMUSCULAR | Status: AC
  Filled 2015-03-26: qty 1

## 2015-03-26 MED ORDER — EPOETIN ALFA 10000 UNIT/ML IJ SOLN
5000.0000 [IU] | INTRAMUSCULAR | Status: DC
  Administered 2015-03-26: 5000 [IU] via SUBCUTANEOUS

## 2015-03-29 ENCOUNTER — Other Ambulatory Visit: Payer: 59

## 2015-03-30 ENCOUNTER — Other Ambulatory Visit: Payer: Self-pay | Admitting: Internal Medicine

## 2015-03-30 DIAGNOSIS — R928 Other abnormal and inconclusive findings on diagnostic imaging of breast: Secondary | ICD-10-CM

## 2015-04-02 ENCOUNTER — Encounter (HOSPITAL_COMMUNITY)
Admission: RE | Admit: 2015-04-02 | Discharge: 2015-04-02 | Disposition: A | Discharge status: Home / Self Care | Payer: 59 | Source: Ambulatory Visit | Attending: Nephrology | Admitting: Nephrology

## 2015-04-02 DIAGNOSIS — N184 Chronic kidney disease, stage 4 (severe): Secondary | ICD-10-CM | POA: Diagnosis not present

## 2015-04-02 LAB — POCT HEMOGLOBIN-HEMACUE: Hemoglobin: 8.7 g/dL — ABNORMAL LOW (ref 12.0–15.0)

## 2015-04-02 MED ORDER — EPOETIN ALFA 10000 UNIT/ML IJ SOLN
5000.0000 [IU] | INTRAMUSCULAR | Status: DC
  Administered 2015-04-02: 5000 [IU] via SUBCUTANEOUS

## 2015-04-02 MED ORDER — EPOETIN ALFA 10000 UNIT/ML IJ SOLN
INTRAMUSCULAR | Status: AC
  Administered 2015-04-02: 5000 [IU] via SUBCUTANEOUS
  Filled 2015-04-02: qty 1

## 2015-04-05 ENCOUNTER — Other Ambulatory Visit: Payer: Self-pay | Admitting: Internal Medicine

## 2015-04-05 ENCOUNTER — Ambulatory Visit
Admission: RE | Admit: 2015-04-05 | Discharge: 2015-04-05 | Disposition: A | Discharge status: Home / Self Care | Payer: 59 | Source: Ambulatory Visit | Attending: Nurse Practitioner | Admitting: Nurse Practitioner

## 2015-04-05 DIAGNOSIS — R928 Other abnormal and inconclusive findings on diagnostic imaging of breast: Secondary | ICD-10-CM

## 2015-04-09 ENCOUNTER — Encounter (HOSPITAL_COMMUNITY)
Admission: RE | Admit: 2015-04-09 | Discharge: 2015-04-09 | Disposition: A | Discharge status: Home / Self Care | Payer: 59 | Source: Ambulatory Visit | Attending: Nephrology | Admitting: Nephrology

## 2015-04-09 DIAGNOSIS — Z79899 Other long term (current) drug therapy: Secondary | ICD-10-CM | POA: Diagnosis not present

## 2015-04-09 DIAGNOSIS — D631 Anemia in chronic kidney disease: Secondary | ICD-10-CM | POA: Diagnosis not present

## 2015-04-09 DIAGNOSIS — Z5181 Encounter for therapeutic drug level monitoring: Secondary | ICD-10-CM | POA: Insufficient documentation

## 2015-04-09 DIAGNOSIS — N184 Chronic kidney disease, stage 4 (severe): Secondary | ICD-10-CM | POA: Insufficient documentation

## 2015-04-09 LAB — POCT HEMOGLOBIN-HEMACUE: HEMOGLOBIN: 8.8 g/dL — AB (ref 12.0–15.0)

## 2015-04-09 MED ORDER — EPOETIN ALFA 10000 UNIT/ML IJ SOLN
5000.0000 [IU] | INTRAMUSCULAR | Status: DC
Start: 2015-04-09 — End: 2015-04-10
  Administered 2015-04-09: 5000 [IU] via SUBCUTANEOUS

## 2015-04-09 MED ORDER — EPOETIN ALFA 10000 UNIT/ML IJ SOLN
INTRAMUSCULAR | Status: AC
  Filled 2015-04-09: qty 1

## 2015-04-15 ENCOUNTER — Encounter (HOSPITAL_COMMUNITY)
Admission: RE | Admit: 2015-04-15 | Discharge: 2015-04-15 | Disposition: A | Discharge status: Home / Self Care | Payer: 59 | Source: Ambulatory Visit | Attending: Nephrology | Admitting: Nephrology

## 2015-04-15 DIAGNOSIS — N184 Chronic kidney disease, stage 4 (severe): Secondary | ICD-10-CM | POA: Diagnosis not present

## 2015-04-15 LAB — POCT HEMOGLOBIN-HEMACUE: Hemoglobin: 9 g/dL — ABNORMAL LOW (ref 12.0–15.0)

## 2015-04-15 MED ORDER — EPOETIN ALFA 10000 UNIT/ML IJ SOLN
5000.0000 [IU] | INTRAMUSCULAR | Status: DC
  Administered 2015-04-15: 5000 [IU] via SUBCUTANEOUS

## 2015-04-15 MED ORDER — EPOETIN ALFA 10000 UNIT/ML IJ SOLN
INTRAMUSCULAR | Status: AC
  Filled 2015-04-15: qty 1

## 2015-04-22 ENCOUNTER — Encounter (HOSPITAL_COMMUNITY)
Admission: RE | Admit: 2015-04-22 | Discharge: 2015-04-22 | Disposition: A | Discharge status: Home / Self Care | Payer: 59 | Source: Ambulatory Visit | Attending: Nephrology | Admitting: Nephrology

## 2015-04-22 DIAGNOSIS — N184 Chronic kidney disease, stage 4 (severe): Secondary | ICD-10-CM | POA: Diagnosis not present

## 2015-04-22 LAB — POCT HEMOGLOBIN-HEMACUE: Hemoglobin: 9.4 g/dL — ABNORMAL LOW (ref 12.0–15.0)

## 2015-04-22 MED ORDER — EPOETIN ALFA 10000 UNIT/ML IJ SOLN
5000.0000 [IU] | INTRAMUSCULAR | Status: DC
  Administered 2015-04-22: 5000 [IU] via SUBCUTANEOUS

## 2015-04-22 MED ORDER — EPOETIN ALFA 10000 UNIT/ML IJ SOLN
INTRAMUSCULAR | Status: AC
  Filled 2015-04-22: qty 1

## 2015-04-29 ENCOUNTER — Encounter (HOSPITAL_COMMUNITY)
Admission: RE | Admit: 2015-04-29 | Discharge: 2015-04-29 | Disposition: A | Discharge status: Home / Self Care | Payer: 59 | Source: Ambulatory Visit | Attending: Nephrology | Admitting: Nephrology

## 2015-04-29 DIAGNOSIS — N184 Chronic kidney disease, stage 4 (severe): Secondary | ICD-10-CM | POA: Diagnosis not present

## 2015-04-29 LAB — IRON AND TIBC
IRON: 34 ug/dL (ref 28–170)
Saturation Ratios: 14 % (ref 10.4–31.8)
TIBC: 241 ug/dL — AB (ref 250–450)
UIBC: 207 ug/dL

## 2015-04-29 LAB — FERRITIN: Ferritin: 118 ng/mL (ref 11–307)

## 2015-04-29 MED ORDER — EPOETIN ALFA 10000 UNIT/ML IJ SOLN
INTRAMUSCULAR | Status: AC
  Filled 2015-04-29: qty 1

## 2015-04-29 MED ORDER — EPOETIN ALFA 10000 UNIT/ML IJ SOLN
5000.0000 [IU] | INTRAMUSCULAR | Status: DC
  Administered 2015-04-29: 5000 [IU] via SUBCUTANEOUS

## 2015-04-30 LAB — POCT HEMOGLOBIN-HEMACUE: Hemoglobin: 9.9 g/dL — ABNORMAL LOW (ref 12.0–15.0)

## 2015-05-06 ENCOUNTER — Encounter (HOSPITAL_COMMUNITY)
Admission: RE | Admit: 2015-05-06 | Discharge: 2015-05-06 | Disposition: A | Discharge status: Home / Self Care | Payer: 59 | Source: Ambulatory Visit | Attending: Nephrology | Admitting: Nephrology

## 2015-05-06 DIAGNOSIS — D631 Anemia in chronic kidney disease: Secondary | ICD-10-CM | POA: Insufficient documentation

## 2015-05-06 DIAGNOSIS — Z79899 Other long term (current) drug therapy: Secondary | ICD-10-CM | POA: Insufficient documentation

## 2015-05-06 DIAGNOSIS — Z5181 Encounter for therapeutic drug level monitoring: Secondary | ICD-10-CM | POA: Diagnosis not present

## 2015-05-06 DIAGNOSIS — N184 Chronic kidney disease, stage 4 (severe): Secondary | ICD-10-CM | POA: Diagnosis present

## 2015-05-06 DIAGNOSIS — D509 Iron deficiency anemia, unspecified: Secondary | ICD-10-CM | POA: Diagnosis not present

## 2015-05-06 MED ORDER — EPOETIN ALFA 10000 UNIT/ML IJ SOLN
INTRAMUSCULAR | Status: AC
  Filled 2015-05-06: qty 1

## 2015-05-06 MED ORDER — EPOETIN ALFA 10000 UNIT/ML IJ SOLN
5000.0000 [IU] | INTRAMUSCULAR | Status: DC
  Administered 2015-05-06: 5000 [IU] via SUBCUTANEOUS

## 2015-05-07 LAB — POCT HEMOGLOBIN-HEMACUE: Hemoglobin: 10 g/dL — ABNORMAL LOW (ref 12.0–15.0)

## 2015-05-13 ENCOUNTER — Encounter (HOSPITAL_COMMUNITY)
Admission: RE | Admit: 2015-05-13 | Discharge: 2015-05-13 | Disposition: A | Discharge status: Home / Self Care | Payer: 59 | Source: Ambulatory Visit | Attending: Nephrology | Admitting: Nephrology

## 2015-05-13 DIAGNOSIS — N184 Chronic kidney disease, stage 4 (severe): Secondary | ICD-10-CM | POA: Diagnosis not present

## 2015-05-13 LAB — POCT HEMOGLOBIN-HEMACUE: HEMOGLOBIN: 10.5 g/dL — AB (ref 12.0–15.0)

## 2015-05-13 MED ORDER — EPOETIN ALFA 10000 UNIT/ML IJ SOLN
5000.0000 [IU] | INTRAMUSCULAR | Status: DC
  Administered 2015-05-13: 5000 [IU] via SUBCUTANEOUS

## 2015-05-13 MED ORDER — EPOETIN ALFA 10000 UNIT/ML IJ SOLN
INTRAMUSCULAR | Status: AC
  Filled 2015-05-13: qty 1

## 2015-05-20 ENCOUNTER — Encounter (HOSPITAL_COMMUNITY)
Admission: RE | Admit: 2015-05-20 | Discharge: 2015-05-20 | Disposition: A | Discharge status: Home / Self Care | Payer: 59 | Source: Ambulatory Visit | Attending: Nephrology | Admitting: Nephrology

## 2015-05-20 DIAGNOSIS — N184 Chronic kidney disease, stage 4 (severe): Secondary | ICD-10-CM | POA: Diagnosis not present

## 2015-05-20 LAB — POCT HEMOGLOBIN-HEMACUE: HEMOGLOBIN: 10.6 g/dL — AB (ref 12.0–15.0)

## 2015-05-20 MED ORDER — EPOETIN ALFA 10000 UNIT/ML IJ SOLN
5000.0000 [IU] | INTRAMUSCULAR | Status: DC
  Administered 2015-05-20: 5000 [IU] via SUBCUTANEOUS

## 2015-05-20 MED ORDER — EPOETIN ALFA 10000 UNIT/ML IJ SOLN
INTRAMUSCULAR | Status: AC
  Filled 2015-05-20: qty 1

## 2015-05-27 ENCOUNTER — Encounter (HOSPITAL_COMMUNITY)
Admission: RE | Admit: 2015-05-27 | Discharge: 2015-05-27 | Disposition: A | Discharge status: Home / Self Care | Payer: 59 | Source: Ambulatory Visit | Attending: Nephrology | Admitting: Nephrology

## 2015-05-27 DIAGNOSIS — N184 Chronic kidney disease, stage 4 (severe): Secondary | ICD-10-CM | POA: Diagnosis not present

## 2015-05-27 LAB — IRON AND TIBC
Iron: 21 ug/dL — ABNORMAL LOW (ref 28–170)
Saturation Ratios: 9 % — ABNORMAL LOW (ref 10.4–31.8)
TIBC: 223 ug/dL — AB (ref 250–450)
UIBC: 202 ug/dL

## 2015-05-27 LAB — POCT HEMOGLOBIN-HEMACUE: HEMOGLOBIN: 10.1 g/dL — AB (ref 12.0–15.0)

## 2015-05-27 LAB — FERRITIN: Ferritin: 104 ng/mL (ref 11–307)

## 2015-05-27 MED ORDER — EPOETIN ALFA 10000 UNIT/ML IJ SOLN
INTRAMUSCULAR | Status: AC
  Filled 2015-05-27: qty 1

## 2015-05-27 MED ORDER — EPOETIN ALFA 10000 UNIT/ML IJ SOLN
5000.0000 [IU] | INTRAMUSCULAR | Status: DC
  Administered 2015-05-27: 5000 [IU] via SUBCUTANEOUS

## 2015-06-02 ENCOUNTER — Other Ambulatory Visit (HOSPITAL_COMMUNITY): Payer: Self-pay | Admitting: *Deleted

## 2015-06-03 ENCOUNTER — Encounter (HOSPITAL_COMMUNITY)
Admission: RE | Admit: 2015-06-03 | Discharge: 2015-06-03 | Disposition: A | Discharge status: Home / Self Care | Payer: 59 | Source: Ambulatory Visit | Attending: Nephrology | Admitting: Nephrology

## 2015-06-03 DIAGNOSIS — N184 Chronic kidney disease, stage 4 (severe): Secondary | ICD-10-CM | POA: Diagnosis not present

## 2015-06-03 LAB — POCT HEMOGLOBIN-HEMACUE: Hemoglobin: 11 g/dL — ABNORMAL LOW (ref 12.0–15.0)

## 2015-06-03 MED ORDER — EPOETIN ALFA 10000 UNIT/ML IJ SOLN
INTRAMUSCULAR | Status: AC
  Filled 2015-06-03: qty 1

## 2015-06-03 MED ORDER — EPOETIN ALFA 10000 UNIT/ML IJ SOLN
5000.0000 [IU] | INTRAMUSCULAR | Status: DC
  Administered 2015-06-03: 5000 [IU] via SUBCUTANEOUS

## 2015-06-03 MED ORDER — SODIUM CHLORIDE 0.9 % IV SOLN
510.0000 mg | INTRAVENOUS | Status: DC
  Administered 2015-06-03: 510 mg via INTRAVENOUS
  Filled 2015-06-03: qty 17

## 2015-06-10 ENCOUNTER — Encounter (HOSPITAL_COMMUNITY)
Admission: RE | Admit: 2015-06-10 | Discharge: 2015-06-10 | Disposition: A | Discharge status: Home / Self Care | Payer: MEDICARE | Source: Ambulatory Visit | Attending: Nephrology | Admitting: Nephrology

## 2015-06-10 ENCOUNTER — Other Ambulatory Visit: Payer: Self-pay | Admitting: Obstetrics & Gynecology

## 2015-06-10 DIAGNOSIS — D631 Anemia in chronic kidney disease: Secondary | ICD-10-CM | POA: Diagnosis not present

## 2015-06-10 DIAGNOSIS — Z5181 Encounter for therapeutic drug level monitoring: Secondary | ICD-10-CM | POA: Diagnosis not present

## 2015-06-10 DIAGNOSIS — N184 Chronic kidney disease, stage 4 (severe): Secondary | ICD-10-CM | POA: Insufficient documentation

## 2015-06-10 DIAGNOSIS — Z124 Encounter for screening for malignant neoplasm of cervix: Secondary | ICD-10-CM | POA: Diagnosis not present

## 2015-06-10 DIAGNOSIS — Z01419 Encounter for gynecological examination (general) (routine) without abnormal findings: Secondary | ICD-10-CM | POA: Diagnosis not present

## 2015-06-10 DIAGNOSIS — Z79899 Other long term (current) drug therapy: Secondary | ICD-10-CM | POA: Insufficient documentation

## 2015-06-10 DIAGNOSIS — D509 Iron deficiency anemia, unspecified: Secondary | ICD-10-CM | POA: Diagnosis not present

## 2015-06-10 DIAGNOSIS — Z6829 Body mass index (BMI) 29.0-29.9, adult: Secondary | ICD-10-CM | POA: Diagnosis not present

## 2015-06-10 LAB — POCT HEMOGLOBIN-HEMACUE: HEMOGLOBIN: 10.8 g/dL — AB (ref 12.0–15.0)

## 2015-06-10 MED ORDER — EPOETIN ALFA 10000 UNIT/ML IJ SOLN: 5000.0000 [IU] | INTRAMUSCULAR | Status: DC

## 2015-06-10 MED ORDER — EPOETIN ALFA 10000 UNIT/ML IJ SOLN
INTRAMUSCULAR | Status: AC
  Administered 2015-06-10: 10000 [IU] via SUBCUTANEOUS
  Filled 2015-06-10: qty 1

## 2015-06-10 MED ORDER — SODIUM CHLORIDE 0.9 % IV SOLN
510.0000 mg | INTRAVENOUS | Status: AC
  Administered 2015-06-10: 510 mg via INTRAVENOUS
  Filled 2015-06-10: qty 17

## 2015-06-11 LAB — CYTOLOGY - PAP

## 2015-06-17 ENCOUNTER — Encounter (HOSPITAL_COMMUNITY): Payer: 59

## 2015-06-17 DIAGNOSIS — E0500 Thyrotoxicosis with diffuse goiter without thyrotoxic crisis or storm: Secondary | ICD-10-CM | POA: Insufficient documentation

## 2015-06-21 ENCOUNTER — Other Ambulatory Visit (HOSPITAL_COMMUNITY): Payer: Self-pay | Admitting: *Deleted

## 2015-06-21 ENCOUNTER — Encounter (HOSPITAL_COMMUNITY)
Admission: RE | Admit: 2015-06-21 | Discharge: 2015-06-21 | Disposition: A | Discharge status: Home / Self Care | Payer: MEDICARE | Source: Ambulatory Visit | Attending: Nephrology | Admitting: Nephrology

## 2015-06-21 DIAGNOSIS — Z5181 Encounter for therapeutic drug level monitoring: Secondary | ICD-10-CM | POA: Diagnosis not present

## 2015-06-21 DIAGNOSIS — N184 Chronic kidney disease, stage 4 (severe): Secondary | ICD-10-CM | POA: Diagnosis not present

## 2015-06-21 DIAGNOSIS — Z79899 Other long term (current) drug therapy: Secondary | ICD-10-CM | POA: Diagnosis not present

## 2015-06-21 DIAGNOSIS — D631 Anemia in chronic kidney disease: Secondary | ICD-10-CM | POA: Insufficient documentation

## 2015-06-21 LAB — FERRITIN: FERRITIN: 524 ng/mL — AB (ref 11–307)

## 2015-06-21 LAB — IRON AND TIBC
Iron: 62 ug/dL (ref 28–170)
SATURATION RATIOS: 30 % (ref 10.4–31.8)
TIBC: 207 ug/dL — ABNORMAL LOW (ref 250–450)
UIBC: 145 ug/dL

## 2015-06-21 LAB — POCT HEMOGLOBIN-HEMACUE: Hemoglobin: 10.9 g/dL — ABNORMAL LOW (ref 12.0–15.0)

## 2015-06-21 MED ORDER — EPOETIN ALFA 10000 UNIT/ML IJ SOLN
INTRAMUSCULAR | Status: AC
  Filled 2015-06-21: qty 1

## 2015-06-21 MED ORDER — EPOETIN ALFA 10000 UNIT/ML IJ SOLN
5000.0000 [IU] | INTRAMUSCULAR | Status: DC
  Administered 2015-06-21: 5000 [IU] via SUBCUTANEOUS

## 2015-06-24 ENCOUNTER — Emergency Department (HOSPITAL_COMMUNITY)
Admission: EM | Admit: 2015-06-24 | Discharge: 2015-06-25 | Discharge status: Against Medical Advice | Payer: MEDICARE | Attending: Emergency Medicine | Admitting: Emergency Medicine

## 2015-06-24 ENCOUNTER — Encounter (HOSPITAL_COMMUNITY): Payer: Self-pay | Admitting: Emergency Medicine

## 2015-06-24 DIAGNOSIS — R030 Elevated blood-pressure reading, without diagnosis of hypertension: Secondary | ICD-10-CM | POA: Diagnosis not present

## 2015-06-24 DIAGNOSIS — N186 End stage renal disease: Secondary | ICD-10-CM | POA: Diagnosis not present

## 2015-06-24 DIAGNOSIS — Z992 Dependence on renal dialysis: Secondary | ICD-10-CM | POA: Insufficient documentation

## 2015-06-24 HISTORY — DX: End stage renal disease: Z99.2

## 2015-06-24 HISTORY — DX: End stage renal disease: N18.6

## 2015-06-24 LAB — COMPREHENSIVE METABOLIC PANEL
ALBUMIN: 4 g/dL (ref 3.5–5.0)
ALT: <5 U/L — ABNORMAL LOW (ref 14–54)
ANION GAP: 11 (ref 5–15)
AST: 10 U/L — AB (ref 15–41)
Alkaline Phosphatase: 75 U/L (ref 38–126)
BUN: 57 mg/dL — AB (ref 6–20)
CHLORIDE: 110 mmol/L (ref 101–111)
CO2: 21 mmol/L — AB (ref 22–32)
Calcium: 9.6 mg/dL (ref 8.9–10.3)
Creatinine, Ser: 6.93 mg/dL — ABNORMAL HIGH (ref 0.44–1.00)
GFR calc Af Amer: 7 mL/min — ABNORMAL LOW (ref >60)
GFR calc non Af Amer: 6 mL/min — ABNORMAL LOW (ref >60)
GLUCOSE: 105 mg/dL — AB (ref 65–99)
POTASSIUM: 4.5 mmol/L (ref 3.5–5.1)
SODIUM: 142 mmol/L (ref 135–145)
TOTAL PROTEIN: 7.5 g/dL (ref 6.5–8.1)
Total Bilirubin: 0.5 mg/dL (ref 0.3–1.2)

## 2015-06-24 LAB — CBC WITH DIFFERENTIAL/PLATELET
BASOS ABS: 0.1 10*3/uL (ref 0.0–0.1)
Basophils Relative: 1 %
EOS PCT: 2 %
Eosinophils Absolute: 0.2 10*3/uL (ref 0.0–0.7)
HEMATOCRIT: 35.7 % — AB (ref 36.0–46.0)
Hemoglobin: 11.3 g/dL — ABNORMAL LOW (ref 12.0–15.0)
LYMPHS ABS: 2.7 10*3/uL (ref 0.7–4.0)
LYMPHS PCT: 23 %
MCH: 26 pg (ref 26.0–34.0)
MCHC: 31.7 g/dL (ref 30.0–36.0)
MCV: 82.3 fL (ref 78.0–100.0)
MONO ABS: 0.9 10*3/uL (ref 0.1–1.0)
MONOS PCT: 7 %
NEUTROS ABS: 7.8 10*3/uL — AB (ref 1.7–7.7)
Neutrophils Relative %: 67 %
Platelets: 354 10*3/uL (ref 150–400)
RBC: 4.34 MIL/uL (ref 3.87–5.11)
RDW: 16.9 % — AB (ref 11.5–15.5)
WBC: 11.6 10*3/uL — ABNORMAL HIGH (ref 4.0–10.5)

## 2015-06-24 NOTE — ED Notes (Signed)
Patient is complaining of dry mouth. Patient had a line put in for peritoneal dialysis yesterday. Patient saying blood pressure is high and has not gone down. The family called the MD that did the surgery at [facility] and his assistant stated if they feel that it is an emergency come to the ED.

## 2015-06-25 DIAGNOSIS — E875 Hyperkalemia: Secondary | ICD-10-CM | POA: Diagnosis not present

## 2015-06-25 DIAGNOSIS — R8299 Other abnormal findings in urine: Secondary | ICD-10-CM | POA: Diagnosis not present

## 2015-06-25 DIAGNOSIS — E784 Other hyperlipidemia: Secondary | ICD-10-CM | POA: Diagnosis not present

## 2015-06-25 DIAGNOSIS — D509 Iron deficiency anemia, unspecified: Secondary | ICD-10-CM | POA: Diagnosis not present

## 2015-06-25 DIAGNOSIS — N2589 Other disorders resulting from impaired renal tubular function: Secondary | ICD-10-CM | POA: Diagnosis not present

## 2015-06-25 DIAGNOSIS — N2581 Secondary hyperparathyroidism of renal origin: Secondary | ICD-10-CM | POA: Diagnosis not present

## 2015-06-25 DIAGNOSIS — D631 Anemia in chronic kidney disease: Secondary | ICD-10-CM | POA: Diagnosis not present

## 2015-06-25 DIAGNOSIS — K769 Liver disease, unspecified: Secondary | ICD-10-CM | POA: Diagnosis not present

## 2015-06-25 DIAGNOSIS — N186 End stage renal disease: Secondary | ICD-10-CM | POA: Diagnosis not present

## 2015-06-25 NOTE — ED Notes (Signed)
Patient was tired of waiting and her blood pressure went down. So they signed out AMA.

## 2015-06-28 DIAGNOSIS — N2581 Secondary hyperparathyroidism of renal origin: Secondary | ICD-10-CM | POA: Diagnosis not present

## 2015-06-28 DIAGNOSIS — N2589 Other disorders resulting from impaired renal tubular function: Secondary | ICD-10-CM | POA: Diagnosis not present

## 2015-06-28 DIAGNOSIS — D509 Iron deficiency anemia, unspecified: Secondary | ICD-10-CM | POA: Diagnosis not present

## 2015-06-28 DIAGNOSIS — D631 Anemia in chronic kidney disease: Secondary | ICD-10-CM | POA: Diagnosis not present

## 2015-06-28 DIAGNOSIS — N186 End stage renal disease: Secondary | ICD-10-CM | POA: Diagnosis not present

## 2015-06-28 DIAGNOSIS — E875 Hyperkalemia: Secondary | ICD-10-CM | POA: Diagnosis not present

## 2015-06-29 ENCOUNTER — Other Ambulatory Visit (HOSPITAL_COMMUNITY): Payer: Self-pay

## 2015-06-29 DIAGNOSIS — Z23 Encounter for immunization: Secondary | ICD-10-CM | POA: Diagnosis not present

## 2015-06-29 DIAGNOSIS — D509 Iron deficiency anemia, unspecified: Secondary | ICD-10-CM | POA: Diagnosis not present

## 2015-06-29 DIAGNOSIS — N186 End stage renal disease: Secondary | ICD-10-CM | POA: Diagnosis not present

## 2015-06-30 ENCOUNTER — Inpatient Hospital Stay (HOSPITAL_COMMUNITY): Admission: RE | Admit: 2015-06-30 | Payer: 59 | Source: Ambulatory Visit

## 2015-06-30 DIAGNOSIS — Z23 Encounter for immunization: Secondary | ICD-10-CM | POA: Diagnosis not present

## 2015-06-30 DIAGNOSIS — N186 End stage renal disease: Secondary | ICD-10-CM | POA: Diagnosis not present

## 2015-06-30 DIAGNOSIS — D509 Iron deficiency anemia, unspecified: Secondary | ICD-10-CM | POA: Diagnosis not present

## 2015-07-01 DIAGNOSIS — N186 End stage renal disease: Secondary | ICD-10-CM | POA: Diagnosis not present

## 2015-07-01 DIAGNOSIS — D509 Iron deficiency anemia, unspecified: Secondary | ICD-10-CM | POA: Diagnosis not present

## 2015-07-01 DIAGNOSIS — Z23 Encounter for immunization: Secondary | ICD-10-CM | POA: Diagnosis not present

## 2015-07-02 DIAGNOSIS — N186 End stage renal disease: Secondary | ICD-10-CM | POA: Diagnosis not present

## 2015-07-02 DIAGNOSIS — D509 Iron deficiency anemia, unspecified: Secondary | ICD-10-CM | POA: Diagnosis not present

## 2015-07-02 DIAGNOSIS — Z23 Encounter for immunization: Secondary | ICD-10-CM | POA: Diagnosis not present

## 2015-07-05 DIAGNOSIS — Z23 Encounter for immunization: Secondary | ICD-10-CM | POA: Diagnosis not present

## 2015-07-05 DIAGNOSIS — N186 End stage renal disease: Secondary | ICD-10-CM | POA: Diagnosis not present

## 2015-07-05 DIAGNOSIS — D509 Iron deficiency anemia, unspecified: Secondary | ICD-10-CM | POA: Diagnosis not present

## 2015-07-06 DIAGNOSIS — Z23 Encounter for immunization: Secondary | ICD-10-CM | POA: Diagnosis not present

## 2015-07-06 DIAGNOSIS — Z79899 Other long term (current) drug therapy: Secondary | ICD-10-CM | POA: Diagnosis not present

## 2015-07-06 DIAGNOSIS — N186 End stage renal disease: Secondary | ICD-10-CM | POA: Diagnosis not present

## 2015-07-06 DIAGNOSIS — D509 Iron deficiency anemia, unspecified: Secondary | ICD-10-CM | POA: Diagnosis not present

## 2015-07-06 DIAGNOSIS — K769 Liver disease, unspecified: Secondary | ICD-10-CM | POA: Diagnosis not present

## 2015-07-06 DIAGNOSIS — D631 Anemia in chronic kidney disease: Secondary | ICD-10-CM | POA: Diagnosis not present

## 2015-07-06 DIAGNOSIS — E440 Moderate protein-calorie malnutrition: Secondary | ICD-10-CM | POA: Diagnosis not present

## 2015-07-07 DIAGNOSIS — Z23 Encounter for immunization: Secondary | ICD-10-CM | POA: Diagnosis not present

## 2015-07-07 DIAGNOSIS — D631 Anemia in chronic kidney disease: Secondary | ICD-10-CM | POA: Diagnosis not present

## 2015-07-07 DIAGNOSIS — N186 End stage renal disease: Secondary | ICD-10-CM | POA: Diagnosis not present

## 2015-07-07 DIAGNOSIS — K769 Liver disease, unspecified: Secondary | ICD-10-CM | POA: Diagnosis not present

## 2015-07-07 DIAGNOSIS — Z79899 Other long term (current) drug therapy: Secondary | ICD-10-CM | POA: Diagnosis not present

## 2015-07-07 DIAGNOSIS — D509 Iron deficiency anemia, unspecified: Secondary | ICD-10-CM | POA: Diagnosis not present

## 2015-07-07 DIAGNOSIS — R8299 Other abnormal findings in urine: Secondary | ICD-10-CM | POA: Diagnosis not present

## 2015-07-08 DIAGNOSIS — N186 End stage renal disease: Secondary | ICD-10-CM | POA: Diagnosis not present

## 2015-07-08 DIAGNOSIS — K769 Liver disease, unspecified: Secondary | ICD-10-CM | POA: Diagnosis not present

## 2015-07-08 DIAGNOSIS — Z79899 Other long term (current) drug therapy: Secondary | ICD-10-CM | POA: Diagnosis not present

## 2015-07-08 DIAGNOSIS — D509 Iron deficiency anemia, unspecified: Secondary | ICD-10-CM | POA: Diagnosis not present

## 2015-07-08 DIAGNOSIS — Z23 Encounter for immunization: Secondary | ICD-10-CM | POA: Diagnosis not present

## 2015-07-08 DIAGNOSIS — D631 Anemia in chronic kidney disease: Secondary | ICD-10-CM | POA: Diagnosis not present

## 2015-07-09 DIAGNOSIS — D631 Anemia in chronic kidney disease: Secondary | ICD-10-CM | POA: Diagnosis not present

## 2015-07-09 DIAGNOSIS — Z23 Encounter for immunization: Secondary | ICD-10-CM | POA: Diagnosis not present

## 2015-07-09 DIAGNOSIS — K769 Liver disease, unspecified: Secondary | ICD-10-CM | POA: Diagnosis not present

## 2015-07-09 DIAGNOSIS — D509 Iron deficiency anemia, unspecified: Secondary | ICD-10-CM | POA: Diagnosis not present

## 2015-07-09 DIAGNOSIS — N186 End stage renal disease: Secondary | ICD-10-CM | POA: Diagnosis not present

## 2015-07-09 DIAGNOSIS — Z79899 Other long term (current) drug therapy: Secondary | ICD-10-CM | POA: Diagnosis not present

## 2015-07-12 DIAGNOSIS — D509 Iron deficiency anemia, unspecified: Secondary | ICD-10-CM | POA: Diagnosis not present

## 2015-07-12 DIAGNOSIS — N186 End stage renal disease: Secondary | ICD-10-CM | POA: Diagnosis not present

## 2015-07-12 DIAGNOSIS — Z23 Encounter for immunization: Secondary | ICD-10-CM | POA: Diagnosis not present

## 2015-07-12 DIAGNOSIS — K769 Liver disease, unspecified: Secondary | ICD-10-CM | POA: Diagnosis not present

## 2015-07-12 DIAGNOSIS — Z79899 Other long term (current) drug therapy: Secondary | ICD-10-CM | POA: Diagnosis not present

## 2015-07-12 DIAGNOSIS — D631 Anemia in chronic kidney disease: Secondary | ICD-10-CM | POA: Diagnosis not present

## 2015-07-13 DIAGNOSIS — Z79899 Other long term (current) drug therapy: Secondary | ICD-10-CM | POA: Diagnosis not present

## 2015-07-13 DIAGNOSIS — N186 End stage renal disease: Secondary | ICD-10-CM | POA: Diagnosis not present

## 2015-07-13 DIAGNOSIS — Z23 Encounter for immunization: Secondary | ICD-10-CM | POA: Diagnosis not present

## 2015-07-13 DIAGNOSIS — K769 Liver disease, unspecified: Secondary | ICD-10-CM | POA: Diagnosis not present

## 2015-07-13 DIAGNOSIS — D631 Anemia in chronic kidney disease: Secondary | ICD-10-CM | POA: Diagnosis not present

## 2015-07-13 DIAGNOSIS — D509 Iron deficiency anemia, unspecified: Secondary | ICD-10-CM | POA: Diagnosis not present

## 2015-07-14 DIAGNOSIS — K769 Liver disease, unspecified: Secondary | ICD-10-CM | POA: Diagnosis not present

## 2015-07-14 DIAGNOSIS — D509 Iron deficiency anemia, unspecified: Secondary | ICD-10-CM | POA: Diagnosis not present

## 2015-07-14 DIAGNOSIS — Z79899 Other long term (current) drug therapy: Secondary | ICD-10-CM | POA: Diagnosis not present

## 2015-07-14 DIAGNOSIS — Z23 Encounter for immunization: Secondary | ICD-10-CM | POA: Diagnosis not present

## 2015-07-14 DIAGNOSIS — D631 Anemia in chronic kidney disease: Secondary | ICD-10-CM | POA: Diagnosis not present

## 2015-07-14 DIAGNOSIS — N186 End stage renal disease: Secondary | ICD-10-CM | POA: Diagnosis not present

## 2015-07-15 DIAGNOSIS — Z23 Encounter for immunization: Secondary | ICD-10-CM | POA: Diagnosis not present

## 2015-07-15 DIAGNOSIS — D509 Iron deficiency anemia, unspecified: Secondary | ICD-10-CM | POA: Diagnosis not present

## 2015-07-15 DIAGNOSIS — N186 End stage renal disease: Secondary | ICD-10-CM | POA: Diagnosis not present

## 2015-07-15 DIAGNOSIS — D631 Anemia in chronic kidney disease: Secondary | ICD-10-CM | POA: Diagnosis not present

## 2015-07-15 DIAGNOSIS — K769 Liver disease, unspecified: Secondary | ICD-10-CM | POA: Diagnosis not present

## 2015-07-15 DIAGNOSIS — Z79899 Other long term (current) drug therapy: Secondary | ICD-10-CM | POA: Diagnosis not present

## 2015-07-16 DIAGNOSIS — D631 Anemia in chronic kidney disease: Secondary | ICD-10-CM | POA: Diagnosis not present

## 2015-07-16 DIAGNOSIS — N186 End stage renal disease: Secondary | ICD-10-CM | POA: Diagnosis not present

## 2015-07-16 DIAGNOSIS — Z79899 Other long term (current) drug therapy: Secondary | ICD-10-CM | POA: Diagnosis not present

## 2015-07-16 DIAGNOSIS — Z23 Encounter for immunization: Secondary | ICD-10-CM | POA: Diagnosis not present

## 2015-07-16 DIAGNOSIS — D509 Iron deficiency anemia, unspecified: Secondary | ICD-10-CM | POA: Diagnosis not present

## 2015-07-16 DIAGNOSIS — K769 Liver disease, unspecified: Secondary | ICD-10-CM | POA: Diagnosis not present

## 2015-07-19 DIAGNOSIS — N186 End stage renal disease: Secondary | ICD-10-CM | POA: Diagnosis not present

## 2015-07-19 DIAGNOSIS — K769 Liver disease, unspecified: Secondary | ICD-10-CM | POA: Diagnosis not present

## 2015-07-19 DIAGNOSIS — Z79899 Other long term (current) drug therapy: Secondary | ICD-10-CM | POA: Diagnosis not present

## 2015-07-19 DIAGNOSIS — Z23 Encounter for immunization: Secondary | ICD-10-CM | POA: Diagnosis not present

## 2015-07-19 DIAGNOSIS — D631 Anemia in chronic kidney disease: Secondary | ICD-10-CM | POA: Diagnosis not present

## 2015-07-19 DIAGNOSIS — D509 Iron deficiency anemia, unspecified: Secondary | ICD-10-CM | POA: Diagnosis not present

## 2015-07-21 DIAGNOSIS — N186 End stage renal disease: Secondary | ICD-10-CM | POA: Diagnosis not present

## 2015-07-21 DIAGNOSIS — N2889 Other specified disorders of kidney and ureter: Secondary | ICD-10-CM | POA: Diagnosis not present

## 2015-07-21 DIAGNOSIS — Z992 Dependence on renal dialysis: Secondary | ICD-10-CM | POA: Diagnosis not present

## 2015-07-22 DIAGNOSIS — Z4932 Encounter for adequacy testing for peritoneal dialysis: Secondary | ICD-10-CM | POA: Diagnosis not present

## 2015-07-22 DIAGNOSIS — N186 End stage renal disease: Secondary | ICD-10-CM | POA: Diagnosis not present

## 2015-07-22 DIAGNOSIS — D509 Iron deficiency anemia, unspecified: Secondary | ICD-10-CM | POA: Diagnosis not present

## 2015-07-23 DIAGNOSIS — N186 End stage renal disease: Secondary | ICD-10-CM | POA: Diagnosis not present

## 2015-07-23 DIAGNOSIS — D509 Iron deficiency anemia, unspecified: Secondary | ICD-10-CM | POA: Diagnosis not present

## 2015-07-23 DIAGNOSIS — Z4932 Encounter for adequacy testing for peritoneal dialysis: Secondary | ICD-10-CM | POA: Diagnosis not present

## 2015-07-24 DIAGNOSIS — D509 Iron deficiency anemia, unspecified: Secondary | ICD-10-CM | POA: Diagnosis not present

## 2015-07-24 DIAGNOSIS — Z4932 Encounter for adequacy testing for peritoneal dialysis: Secondary | ICD-10-CM | POA: Diagnosis not present

## 2015-07-24 DIAGNOSIS — N186 End stage renal disease: Secondary | ICD-10-CM | POA: Diagnosis not present

## 2015-07-25 DIAGNOSIS — D509 Iron deficiency anemia, unspecified: Secondary | ICD-10-CM | POA: Diagnosis not present

## 2015-07-25 DIAGNOSIS — N186 End stage renal disease: Secondary | ICD-10-CM | POA: Diagnosis not present

## 2015-07-25 DIAGNOSIS — Z4932 Encounter for adequacy testing for peritoneal dialysis: Secondary | ICD-10-CM | POA: Diagnosis not present

## 2015-07-26 DIAGNOSIS — D509 Iron deficiency anemia, unspecified: Secondary | ICD-10-CM | POA: Diagnosis not present

## 2015-07-26 DIAGNOSIS — N186 End stage renal disease: Secondary | ICD-10-CM | POA: Diagnosis not present

## 2015-07-26 DIAGNOSIS — Z4932 Encounter for adequacy testing for peritoneal dialysis: Secondary | ICD-10-CM | POA: Diagnosis not present

## 2015-07-27 DIAGNOSIS — D509 Iron deficiency anemia, unspecified: Secondary | ICD-10-CM | POA: Diagnosis not present

## 2015-07-27 DIAGNOSIS — Z4932 Encounter for adequacy testing for peritoneal dialysis: Secondary | ICD-10-CM | POA: Diagnosis not present

## 2015-07-27 DIAGNOSIS — N186 End stage renal disease: Secondary | ICD-10-CM | POA: Diagnosis not present

## 2015-07-28 DIAGNOSIS — Z4932 Encounter for adequacy testing for peritoneal dialysis: Secondary | ICD-10-CM | POA: Diagnosis not present

## 2015-07-28 DIAGNOSIS — N186 End stage renal disease: Secondary | ICD-10-CM | POA: Diagnosis not present

## 2015-07-28 DIAGNOSIS — D509 Iron deficiency anemia, unspecified: Secondary | ICD-10-CM | POA: Diagnosis not present

## 2015-07-29 DIAGNOSIS — Z4932 Encounter for adequacy testing for peritoneal dialysis: Secondary | ICD-10-CM | POA: Diagnosis not present

## 2015-07-29 DIAGNOSIS — N186 End stage renal disease: Secondary | ICD-10-CM | POA: Diagnosis not present

## 2015-07-29 DIAGNOSIS — D509 Iron deficiency anemia, unspecified: Secondary | ICD-10-CM | POA: Diagnosis not present

## 2015-07-30 DIAGNOSIS — Z4932 Encounter for adequacy testing for peritoneal dialysis: Secondary | ICD-10-CM | POA: Diagnosis not present

## 2015-07-30 DIAGNOSIS — D509 Iron deficiency anemia, unspecified: Secondary | ICD-10-CM | POA: Diagnosis not present

## 2015-07-30 DIAGNOSIS — N186 End stage renal disease: Secondary | ICD-10-CM | POA: Diagnosis not present

## 2015-07-31 DIAGNOSIS — Z4932 Encounter for adequacy testing for peritoneal dialysis: Secondary | ICD-10-CM | POA: Diagnosis not present

## 2015-07-31 DIAGNOSIS — N186 End stage renal disease: Secondary | ICD-10-CM | POA: Diagnosis not present

## 2015-07-31 DIAGNOSIS — D509 Iron deficiency anemia, unspecified: Secondary | ICD-10-CM | POA: Diagnosis not present

## 2015-08-01 DIAGNOSIS — N186 End stage renal disease: Secondary | ICD-10-CM | POA: Diagnosis not present

## 2015-08-01 DIAGNOSIS — D509 Iron deficiency anemia, unspecified: Secondary | ICD-10-CM | POA: Diagnosis not present

## 2015-08-01 DIAGNOSIS — Z4932 Encounter for adequacy testing for peritoneal dialysis: Secondary | ICD-10-CM | POA: Diagnosis not present

## 2015-08-02 DIAGNOSIS — N186 End stage renal disease: Secondary | ICD-10-CM | POA: Diagnosis not present

## 2015-08-02 DIAGNOSIS — D509 Iron deficiency anemia, unspecified: Secondary | ICD-10-CM | POA: Diagnosis not present

## 2015-08-02 DIAGNOSIS — Z4932 Encounter for adequacy testing for peritoneal dialysis: Secondary | ICD-10-CM | POA: Diagnosis not present

## 2015-08-03 DIAGNOSIS — N186 End stage renal disease: Secondary | ICD-10-CM | POA: Diagnosis not present

## 2015-08-03 DIAGNOSIS — Z4932 Encounter for adequacy testing for peritoneal dialysis: Secondary | ICD-10-CM | POA: Diagnosis not present

## 2015-08-03 DIAGNOSIS — D509 Iron deficiency anemia, unspecified: Secondary | ICD-10-CM | POA: Diagnosis not present

## 2015-08-04 DIAGNOSIS — N2889 Other specified disorders of kidney and ureter: Secondary | ICD-10-CM | POA: Diagnosis not present

## 2015-08-04 DIAGNOSIS — Z992 Dependence on renal dialysis: Secondary | ICD-10-CM | POA: Diagnosis not present

## 2015-08-04 DIAGNOSIS — N186 End stage renal disease: Secondary | ICD-10-CM | POA: Diagnosis not present

## 2015-08-04 DIAGNOSIS — Z4932 Encounter for adequacy testing for peritoneal dialysis: Secondary | ICD-10-CM | POA: Diagnosis not present

## 2015-08-04 DIAGNOSIS — D509 Iron deficiency anemia, unspecified: Secondary | ICD-10-CM | POA: Diagnosis not present

## 2015-08-05 DIAGNOSIS — D631 Anemia in chronic kidney disease: Secondary | ICD-10-CM | POA: Diagnosis not present

## 2015-08-05 DIAGNOSIS — N186 End stage renal disease: Secondary | ICD-10-CM | POA: Diagnosis not present

## 2015-08-05 DIAGNOSIS — N2581 Secondary hyperparathyroidism of renal origin: Secondary | ICD-10-CM | POA: Diagnosis not present

## 2015-08-05 DIAGNOSIS — D509 Iron deficiency anemia, unspecified: Secondary | ICD-10-CM | POA: Diagnosis not present

## 2015-08-05 DIAGNOSIS — E440 Moderate protein-calorie malnutrition: Secondary | ICD-10-CM | POA: Diagnosis not present

## 2015-08-05 DIAGNOSIS — Z79899 Other long term (current) drug therapy: Secondary | ICD-10-CM | POA: Diagnosis not present

## 2015-08-05 DIAGNOSIS — R8299 Other abnormal findings in urine: Secondary | ICD-10-CM | POA: Diagnosis not present

## 2015-08-05 DIAGNOSIS — Z4932 Encounter for adequacy testing for peritoneal dialysis: Secondary | ICD-10-CM | POA: Diagnosis not present

## 2015-08-05 DIAGNOSIS — K769 Liver disease, unspecified: Secondary | ICD-10-CM | POA: Diagnosis not present

## 2015-08-06 DIAGNOSIS — Z79899 Other long term (current) drug therapy: Secondary | ICD-10-CM | POA: Diagnosis not present

## 2015-08-06 DIAGNOSIS — N2581 Secondary hyperparathyroidism of renal origin: Secondary | ICD-10-CM | POA: Diagnosis not present

## 2015-08-06 DIAGNOSIS — E440 Moderate protein-calorie malnutrition: Secondary | ICD-10-CM | POA: Diagnosis not present

## 2015-08-06 DIAGNOSIS — D509 Iron deficiency anemia, unspecified: Secondary | ICD-10-CM | POA: Diagnosis not present

## 2015-08-06 DIAGNOSIS — N186 End stage renal disease: Secondary | ICD-10-CM | POA: Diagnosis not present

## 2015-08-06 DIAGNOSIS — D631 Anemia in chronic kidney disease: Secondary | ICD-10-CM | POA: Diagnosis not present

## 2015-08-07 DIAGNOSIS — D509 Iron deficiency anemia, unspecified: Secondary | ICD-10-CM | POA: Diagnosis not present

## 2015-08-07 DIAGNOSIS — N186 End stage renal disease: Secondary | ICD-10-CM | POA: Diagnosis not present

## 2015-08-07 DIAGNOSIS — N2581 Secondary hyperparathyroidism of renal origin: Secondary | ICD-10-CM | POA: Diagnosis not present

## 2015-08-07 DIAGNOSIS — E440 Moderate protein-calorie malnutrition: Secondary | ICD-10-CM | POA: Diagnosis not present

## 2015-08-07 DIAGNOSIS — D631 Anemia in chronic kidney disease: Secondary | ICD-10-CM | POA: Diagnosis not present

## 2015-08-07 DIAGNOSIS — Z79899 Other long term (current) drug therapy: Secondary | ICD-10-CM | POA: Diagnosis not present

## 2015-08-08 DIAGNOSIS — D509 Iron deficiency anemia, unspecified: Secondary | ICD-10-CM | POA: Diagnosis not present

## 2015-08-08 DIAGNOSIS — N186 End stage renal disease: Secondary | ICD-10-CM | POA: Diagnosis not present

## 2015-08-08 DIAGNOSIS — D631 Anemia in chronic kidney disease: Secondary | ICD-10-CM | POA: Diagnosis not present

## 2015-08-08 DIAGNOSIS — Z79899 Other long term (current) drug therapy: Secondary | ICD-10-CM | POA: Diagnosis not present

## 2015-08-08 DIAGNOSIS — E440 Moderate protein-calorie malnutrition: Secondary | ICD-10-CM | POA: Diagnosis not present

## 2015-08-08 DIAGNOSIS — N2581 Secondary hyperparathyroidism of renal origin: Secondary | ICD-10-CM | POA: Diagnosis not present

## 2015-08-09 DIAGNOSIS — E440 Moderate protein-calorie malnutrition: Secondary | ICD-10-CM | POA: Diagnosis not present

## 2015-08-09 DIAGNOSIS — N2581 Secondary hyperparathyroidism of renal origin: Secondary | ICD-10-CM | POA: Diagnosis not present

## 2015-08-09 DIAGNOSIS — D509 Iron deficiency anemia, unspecified: Secondary | ICD-10-CM | POA: Diagnosis not present

## 2015-08-09 DIAGNOSIS — Z79899 Other long term (current) drug therapy: Secondary | ICD-10-CM | POA: Diagnosis not present

## 2015-08-09 DIAGNOSIS — D631 Anemia in chronic kidney disease: Secondary | ICD-10-CM | POA: Diagnosis not present

## 2015-08-09 DIAGNOSIS — N186 End stage renal disease: Secondary | ICD-10-CM | POA: Diagnosis not present

## 2015-08-10 DIAGNOSIS — N2581 Secondary hyperparathyroidism of renal origin: Secondary | ICD-10-CM | POA: Diagnosis not present

## 2015-08-10 DIAGNOSIS — D509 Iron deficiency anemia, unspecified: Secondary | ICD-10-CM | POA: Diagnosis not present

## 2015-08-10 DIAGNOSIS — E440 Moderate protein-calorie malnutrition: Secondary | ICD-10-CM | POA: Diagnosis not present

## 2015-08-10 DIAGNOSIS — N186 End stage renal disease: Secondary | ICD-10-CM | POA: Diagnosis not present

## 2015-08-10 DIAGNOSIS — D631 Anemia in chronic kidney disease: Secondary | ICD-10-CM | POA: Diagnosis not present

## 2015-08-10 DIAGNOSIS — Z79899 Other long term (current) drug therapy: Secondary | ICD-10-CM | POA: Diagnosis not present

## 2015-08-11 DIAGNOSIS — Z79899 Other long term (current) drug therapy: Secondary | ICD-10-CM | POA: Diagnosis not present

## 2015-08-11 DIAGNOSIS — D509 Iron deficiency anemia, unspecified: Secondary | ICD-10-CM | POA: Diagnosis not present

## 2015-08-11 DIAGNOSIS — D631 Anemia in chronic kidney disease: Secondary | ICD-10-CM | POA: Diagnosis not present

## 2015-08-11 DIAGNOSIS — N186 End stage renal disease: Secondary | ICD-10-CM | POA: Diagnosis not present

## 2015-08-11 DIAGNOSIS — N2581 Secondary hyperparathyroidism of renal origin: Secondary | ICD-10-CM | POA: Diagnosis not present

## 2015-08-11 DIAGNOSIS — E440 Moderate protein-calorie malnutrition: Secondary | ICD-10-CM | POA: Diagnosis not present

## 2015-08-12 DIAGNOSIS — N186 End stage renal disease: Secondary | ICD-10-CM | POA: Diagnosis not present

## 2015-08-12 DIAGNOSIS — D631 Anemia in chronic kidney disease: Secondary | ICD-10-CM | POA: Diagnosis not present

## 2015-08-12 DIAGNOSIS — E440 Moderate protein-calorie malnutrition: Secondary | ICD-10-CM | POA: Diagnosis not present

## 2015-08-12 DIAGNOSIS — Z79899 Other long term (current) drug therapy: Secondary | ICD-10-CM | POA: Diagnosis not present

## 2015-08-12 DIAGNOSIS — D509 Iron deficiency anemia, unspecified: Secondary | ICD-10-CM | POA: Diagnosis not present

## 2015-08-12 DIAGNOSIS — N2581 Secondary hyperparathyroidism of renal origin: Secondary | ICD-10-CM | POA: Diagnosis not present

## 2015-08-13 DIAGNOSIS — N186 End stage renal disease: Secondary | ICD-10-CM | POA: Diagnosis not present

## 2015-08-13 DIAGNOSIS — D631 Anemia in chronic kidney disease: Secondary | ICD-10-CM | POA: Diagnosis not present

## 2015-08-13 DIAGNOSIS — E440 Moderate protein-calorie malnutrition: Secondary | ICD-10-CM | POA: Diagnosis not present

## 2015-08-13 DIAGNOSIS — D509 Iron deficiency anemia, unspecified: Secondary | ICD-10-CM | POA: Diagnosis not present

## 2015-08-13 DIAGNOSIS — Z79899 Other long term (current) drug therapy: Secondary | ICD-10-CM | POA: Diagnosis not present

## 2015-08-13 DIAGNOSIS — N2581 Secondary hyperparathyroidism of renal origin: Secondary | ICD-10-CM | POA: Diagnosis not present

## 2015-08-14 DIAGNOSIS — N2581 Secondary hyperparathyroidism of renal origin: Secondary | ICD-10-CM | POA: Diagnosis not present

## 2015-08-14 DIAGNOSIS — D631 Anemia in chronic kidney disease: Secondary | ICD-10-CM | POA: Diagnosis not present

## 2015-08-14 DIAGNOSIS — N186 End stage renal disease: Secondary | ICD-10-CM | POA: Diagnosis not present

## 2015-08-14 DIAGNOSIS — D509 Iron deficiency anemia, unspecified: Secondary | ICD-10-CM | POA: Diagnosis not present

## 2015-08-14 DIAGNOSIS — Z79899 Other long term (current) drug therapy: Secondary | ICD-10-CM | POA: Diagnosis not present

## 2015-08-14 DIAGNOSIS — E440 Moderate protein-calorie malnutrition: Secondary | ICD-10-CM | POA: Diagnosis not present

## 2015-08-15 DIAGNOSIS — D631 Anemia in chronic kidney disease: Secondary | ICD-10-CM | POA: Diagnosis not present

## 2015-08-15 DIAGNOSIS — Z79899 Other long term (current) drug therapy: Secondary | ICD-10-CM | POA: Diagnosis not present

## 2015-08-15 DIAGNOSIS — N2581 Secondary hyperparathyroidism of renal origin: Secondary | ICD-10-CM | POA: Diagnosis not present

## 2015-08-15 DIAGNOSIS — E440 Moderate protein-calorie malnutrition: Secondary | ICD-10-CM | POA: Diagnosis not present

## 2015-08-15 DIAGNOSIS — N186 End stage renal disease: Secondary | ICD-10-CM | POA: Diagnosis not present

## 2015-08-15 DIAGNOSIS — D509 Iron deficiency anemia, unspecified: Secondary | ICD-10-CM | POA: Diagnosis not present

## 2015-08-16 DIAGNOSIS — N186 End stage renal disease: Secondary | ICD-10-CM | POA: Diagnosis not present

## 2015-08-16 DIAGNOSIS — N2581 Secondary hyperparathyroidism of renal origin: Secondary | ICD-10-CM | POA: Diagnosis not present

## 2015-08-16 DIAGNOSIS — Z79899 Other long term (current) drug therapy: Secondary | ICD-10-CM | POA: Diagnosis not present

## 2015-08-16 DIAGNOSIS — D631 Anemia in chronic kidney disease: Secondary | ICD-10-CM | POA: Diagnosis not present

## 2015-08-16 DIAGNOSIS — D509 Iron deficiency anemia, unspecified: Secondary | ICD-10-CM | POA: Diagnosis not present

## 2015-08-16 DIAGNOSIS — E440 Moderate protein-calorie malnutrition: Secondary | ICD-10-CM | POA: Diagnosis not present

## 2015-08-17 DIAGNOSIS — N2581 Secondary hyperparathyroidism of renal origin: Secondary | ICD-10-CM | POA: Diagnosis not present

## 2015-08-17 DIAGNOSIS — Z79899 Other long term (current) drug therapy: Secondary | ICD-10-CM | POA: Diagnosis not present

## 2015-08-17 DIAGNOSIS — D509 Iron deficiency anemia, unspecified: Secondary | ICD-10-CM | POA: Diagnosis not present

## 2015-08-17 DIAGNOSIS — N186 End stage renal disease: Secondary | ICD-10-CM | POA: Diagnosis not present

## 2015-08-17 DIAGNOSIS — D631 Anemia in chronic kidney disease: Secondary | ICD-10-CM | POA: Diagnosis not present

## 2015-08-17 DIAGNOSIS — E440 Moderate protein-calorie malnutrition: Secondary | ICD-10-CM | POA: Diagnosis not present

## 2015-08-18 DIAGNOSIS — N186 End stage renal disease: Secondary | ICD-10-CM | POA: Diagnosis not present

## 2015-08-18 DIAGNOSIS — D509 Iron deficiency anemia, unspecified: Secondary | ICD-10-CM | POA: Diagnosis not present

## 2015-08-18 DIAGNOSIS — Z79899 Other long term (current) drug therapy: Secondary | ICD-10-CM | POA: Diagnosis not present

## 2015-08-18 DIAGNOSIS — D631 Anemia in chronic kidney disease: Secondary | ICD-10-CM | POA: Diagnosis not present

## 2015-08-18 DIAGNOSIS — E440 Moderate protein-calorie malnutrition: Secondary | ICD-10-CM | POA: Diagnosis not present

## 2015-08-18 DIAGNOSIS — N2581 Secondary hyperparathyroidism of renal origin: Secondary | ICD-10-CM | POA: Diagnosis not present

## 2015-08-19 DIAGNOSIS — D509 Iron deficiency anemia, unspecified: Secondary | ICD-10-CM | POA: Diagnosis not present

## 2015-08-19 DIAGNOSIS — Z79899 Other long term (current) drug therapy: Secondary | ICD-10-CM | POA: Diagnosis not present

## 2015-08-19 DIAGNOSIS — N2581 Secondary hyperparathyroidism of renal origin: Secondary | ICD-10-CM | POA: Diagnosis not present

## 2015-08-19 DIAGNOSIS — D631 Anemia in chronic kidney disease: Secondary | ICD-10-CM | POA: Diagnosis not present

## 2015-08-19 DIAGNOSIS — N186 End stage renal disease: Secondary | ICD-10-CM | POA: Diagnosis not present

## 2015-08-19 DIAGNOSIS — E440 Moderate protein-calorie malnutrition: Secondary | ICD-10-CM | POA: Diagnosis not present

## 2015-08-20 DIAGNOSIS — E440 Moderate protein-calorie malnutrition: Secondary | ICD-10-CM | POA: Diagnosis not present

## 2015-08-20 DIAGNOSIS — D509 Iron deficiency anemia, unspecified: Secondary | ICD-10-CM | POA: Diagnosis not present

## 2015-08-20 DIAGNOSIS — N186 End stage renal disease: Secondary | ICD-10-CM | POA: Diagnosis not present

## 2015-08-20 DIAGNOSIS — D631 Anemia in chronic kidney disease: Secondary | ICD-10-CM | POA: Diagnosis not present

## 2015-08-20 DIAGNOSIS — Z79899 Other long term (current) drug therapy: Secondary | ICD-10-CM | POA: Diagnosis not present

## 2015-08-20 DIAGNOSIS — N2581 Secondary hyperparathyroidism of renal origin: Secondary | ICD-10-CM | POA: Diagnosis not present

## 2015-08-21 DIAGNOSIS — Z79899 Other long term (current) drug therapy: Secondary | ICD-10-CM | POA: Diagnosis not present

## 2015-08-21 DIAGNOSIS — E440 Moderate protein-calorie malnutrition: Secondary | ICD-10-CM | POA: Diagnosis not present

## 2015-08-21 DIAGNOSIS — N186 End stage renal disease: Secondary | ICD-10-CM | POA: Diagnosis not present

## 2015-08-21 DIAGNOSIS — N2581 Secondary hyperparathyroidism of renal origin: Secondary | ICD-10-CM | POA: Diagnosis not present

## 2015-08-21 DIAGNOSIS — D631 Anemia in chronic kidney disease: Secondary | ICD-10-CM | POA: Diagnosis not present

## 2015-08-21 DIAGNOSIS — D509 Iron deficiency anemia, unspecified: Secondary | ICD-10-CM | POA: Diagnosis not present

## 2015-08-22 DIAGNOSIS — E440 Moderate protein-calorie malnutrition: Secondary | ICD-10-CM | POA: Diagnosis not present

## 2015-08-22 DIAGNOSIS — N2581 Secondary hyperparathyroidism of renal origin: Secondary | ICD-10-CM | POA: Diagnosis not present

## 2015-08-22 DIAGNOSIS — N186 End stage renal disease: Secondary | ICD-10-CM | POA: Diagnosis not present

## 2015-08-22 DIAGNOSIS — Z79899 Other long term (current) drug therapy: Secondary | ICD-10-CM | POA: Diagnosis not present

## 2015-08-22 DIAGNOSIS — D509 Iron deficiency anemia, unspecified: Secondary | ICD-10-CM | POA: Diagnosis not present

## 2015-08-22 DIAGNOSIS — D631 Anemia in chronic kidney disease: Secondary | ICD-10-CM | POA: Diagnosis not present

## 2015-08-23 DIAGNOSIS — N2581 Secondary hyperparathyroidism of renal origin: Secondary | ICD-10-CM | POA: Diagnosis not present

## 2015-08-23 DIAGNOSIS — Z79899 Other long term (current) drug therapy: Secondary | ICD-10-CM | POA: Diagnosis not present

## 2015-08-23 DIAGNOSIS — N186 End stage renal disease: Secondary | ICD-10-CM | POA: Diagnosis not present

## 2015-08-23 DIAGNOSIS — D631 Anemia in chronic kidney disease: Secondary | ICD-10-CM | POA: Diagnosis not present

## 2015-08-23 DIAGNOSIS — E440 Moderate protein-calorie malnutrition: Secondary | ICD-10-CM | POA: Diagnosis not present

## 2015-08-23 DIAGNOSIS — D509 Iron deficiency anemia, unspecified: Secondary | ICD-10-CM | POA: Diagnosis not present

## 2015-08-24 DIAGNOSIS — D509 Iron deficiency anemia, unspecified: Secondary | ICD-10-CM | POA: Diagnosis not present

## 2015-08-24 DIAGNOSIS — D631 Anemia in chronic kidney disease: Secondary | ICD-10-CM | POA: Diagnosis not present

## 2015-08-24 DIAGNOSIS — E440 Moderate protein-calorie malnutrition: Secondary | ICD-10-CM | POA: Diagnosis not present

## 2015-08-24 DIAGNOSIS — N186 End stage renal disease: Secondary | ICD-10-CM | POA: Diagnosis not present

## 2015-08-24 DIAGNOSIS — N2581 Secondary hyperparathyroidism of renal origin: Secondary | ICD-10-CM | POA: Diagnosis not present

## 2015-08-24 DIAGNOSIS — Z79899 Other long term (current) drug therapy: Secondary | ICD-10-CM | POA: Diagnosis not present

## 2015-08-25 DIAGNOSIS — N2581 Secondary hyperparathyroidism of renal origin: Secondary | ICD-10-CM | POA: Diagnosis not present

## 2015-08-25 DIAGNOSIS — D631 Anemia in chronic kidney disease: Secondary | ICD-10-CM | POA: Diagnosis not present

## 2015-08-25 DIAGNOSIS — D509 Iron deficiency anemia, unspecified: Secondary | ICD-10-CM | POA: Diagnosis not present

## 2015-08-25 DIAGNOSIS — E440 Moderate protein-calorie malnutrition: Secondary | ICD-10-CM | POA: Diagnosis not present

## 2015-08-25 DIAGNOSIS — Z79899 Other long term (current) drug therapy: Secondary | ICD-10-CM | POA: Diagnosis not present

## 2015-08-25 DIAGNOSIS — N186 End stage renal disease: Secondary | ICD-10-CM | POA: Diagnosis not present

## 2015-08-26 DIAGNOSIS — D631 Anemia in chronic kidney disease: Secondary | ICD-10-CM | POA: Diagnosis not present

## 2015-08-26 DIAGNOSIS — Z79899 Other long term (current) drug therapy: Secondary | ICD-10-CM | POA: Diagnosis not present

## 2015-08-26 DIAGNOSIS — N186 End stage renal disease: Secondary | ICD-10-CM | POA: Diagnosis not present

## 2015-08-26 DIAGNOSIS — N2581 Secondary hyperparathyroidism of renal origin: Secondary | ICD-10-CM | POA: Diagnosis not present

## 2015-08-26 DIAGNOSIS — E440 Moderate protein-calorie malnutrition: Secondary | ICD-10-CM | POA: Diagnosis not present

## 2015-08-26 DIAGNOSIS — D509 Iron deficiency anemia, unspecified: Secondary | ICD-10-CM | POA: Diagnosis not present

## 2015-08-27 DIAGNOSIS — E440 Moderate protein-calorie malnutrition: Secondary | ICD-10-CM | POA: Diagnosis not present

## 2015-08-27 DIAGNOSIS — D631 Anemia in chronic kidney disease: Secondary | ICD-10-CM | POA: Diagnosis not present

## 2015-08-27 DIAGNOSIS — D509 Iron deficiency anemia, unspecified: Secondary | ICD-10-CM | POA: Diagnosis not present

## 2015-08-27 DIAGNOSIS — N186 End stage renal disease: Secondary | ICD-10-CM | POA: Diagnosis not present

## 2015-08-27 DIAGNOSIS — N2581 Secondary hyperparathyroidism of renal origin: Secondary | ICD-10-CM | POA: Diagnosis not present

## 2015-08-27 DIAGNOSIS — Z79899 Other long term (current) drug therapy: Secondary | ICD-10-CM | POA: Diagnosis not present

## 2015-08-28 DIAGNOSIS — E440 Moderate protein-calorie malnutrition: Secondary | ICD-10-CM | POA: Diagnosis not present

## 2015-08-28 DIAGNOSIS — N2581 Secondary hyperparathyroidism of renal origin: Secondary | ICD-10-CM | POA: Diagnosis not present

## 2015-08-28 DIAGNOSIS — N186 End stage renal disease: Secondary | ICD-10-CM | POA: Diagnosis not present

## 2015-08-28 DIAGNOSIS — D631 Anemia in chronic kidney disease: Secondary | ICD-10-CM | POA: Diagnosis not present

## 2015-08-28 DIAGNOSIS — Z79899 Other long term (current) drug therapy: Secondary | ICD-10-CM | POA: Diagnosis not present

## 2015-08-28 DIAGNOSIS — D509 Iron deficiency anemia, unspecified: Secondary | ICD-10-CM | POA: Diagnosis not present

## 2015-08-29 DIAGNOSIS — D631 Anemia in chronic kidney disease: Secondary | ICD-10-CM | POA: Diagnosis not present

## 2015-08-29 DIAGNOSIS — E440 Moderate protein-calorie malnutrition: Secondary | ICD-10-CM | POA: Diagnosis not present

## 2015-08-29 DIAGNOSIS — D509 Iron deficiency anemia, unspecified: Secondary | ICD-10-CM | POA: Diagnosis not present

## 2015-08-29 DIAGNOSIS — N186 End stage renal disease: Secondary | ICD-10-CM | POA: Diagnosis not present

## 2015-08-29 DIAGNOSIS — Z79899 Other long term (current) drug therapy: Secondary | ICD-10-CM | POA: Diagnosis not present

## 2015-08-29 DIAGNOSIS — N2581 Secondary hyperparathyroidism of renal origin: Secondary | ICD-10-CM | POA: Diagnosis not present

## 2015-08-30 DIAGNOSIS — N186 End stage renal disease: Secondary | ICD-10-CM | POA: Diagnosis not present

## 2015-08-30 DIAGNOSIS — N2581 Secondary hyperparathyroidism of renal origin: Secondary | ICD-10-CM | POA: Diagnosis not present

## 2015-08-30 DIAGNOSIS — E440 Moderate protein-calorie malnutrition: Secondary | ICD-10-CM | POA: Diagnosis not present

## 2015-08-30 DIAGNOSIS — D631 Anemia in chronic kidney disease: Secondary | ICD-10-CM | POA: Diagnosis not present

## 2015-08-30 DIAGNOSIS — Z79899 Other long term (current) drug therapy: Secondary | ICD-10-CM | POA: Diagnosis not present

## 2015-08-30 DIAGNOSIS — D509 Iron deficiency anemia, unspecified: Secondary | ICD-10-CM | POA: Diagnosis not present

## 2015-08-31 DIAGNOSIS — E440 Moderate protein-calorie malnutrition: Secondary | ICD-10-CM | POA: Diagnosis not present

## 2015-08-31 DIAGNOSIS — Z79899 Other long term (current) drug therapy: Secondary | ICD-10-CM | POA: Diagnosis not present

## 2015-08-31 DIAGNOSIS — D509 Iron deficiency anemia, unspecified: Secondary | ICD-10-CM | POA: Diagnosis not present

## 2015-08-31 DIAGNOSIS — N2581 Secondary hyperparathyroidism of renal origin: Secondary | ICD-10-CM | POA: Diagnosis not present

## 2015-08-31 DIAGNOSIS — N186 End stage renal disease: Secondary | ICD-10-CM | POA: Diagnosis not present

## 2015-08-31 DIAGNOSIS — D631 Anemia in chronic kidney disease: Secondary | ICD-10-CM | POA: Diagnosis not present

## 2015-09-01 DIAGNOSIS — N186 End stage renal disease: Secondary | ICD-10-CM | POA: Diagnosis not present

## 2015-09-01 DIAGNOSIS — N2581 Secondary hyperparathyroidism of renal origin: Secondary | ICD-10-CM | POA: Diagnosis not present

## 2015-09-01 DIAGNOSIS — D631 Anemia in chronic kidney disease: Secondary | ICD-10-CM | POA: Diagnosis not present

## 2015-09-01 DIAGNOSIS — D509 Iron deficiency anemia, unspecified: Secondary | ICD-10-CM | POA: Diagnosis not present

## 2015-09-01 DIAGNOSIS — Z79899 Other long term (current) drug therapy: Secondary | ICD-10-CM | POA: Diagnosis not present

## 2015-09-01 DIAGNOSIS — E440 Moderate protein-calorie malnutrition: Secondary | ICD-10-CM | POA: Diagnosis not present

## 2015-09-02 DIAGNOSIS — D631 Anemia in chronic kidney disease: Secondary | ICD-10-CM | POA: Diagnosis not present

## 2015-09-02 DIAGNOSIS — N186 End stage renal disease: Secondary | ICD-10-CM | POA: Diagnosis not present

## 2015-09-02 DIAGNOSIS — D509 Iron deficiency anemia, unspecified: Secondary | ICD-10-CM | POA: Diagnosis not present

## 2015-09-02 DIAGNOSIS — Z79899 Other long term (current) drug therapy: Secondary | ICD-10-CM | POA: Diagnosis not present

## 2015-09-02 DIAGNOSIS — E440 Moderate protein-calorie malnutrition: Secondary | ICD-10-CM | POA: Diagnosis not present

## 2015-09-02 DIAGNOSIS — N2581 Secondary hyperparathyroidism of renal origin: Secondary | ICD-10-CM | POA: Diagnosis not present

## 2015-09-03 DIAGNOSIS — E440 Moderate protein-calorie malnutrition: Secondary | ICD-10-CM | POA: Diagnosis not present

## 2015-09-03 DIAGNOSIS — D631 Anemia in chronic kidney disease: Secondary | ICD-10-CM | POA: Diagnosis not present

## 2015-09-03 DIAGNOSIS — Z79899 Other long term (current) drug therapy: Secondary | ICD-10-CM | POA: Diagnosis not present

## 2015-09-03 DIAGNOSIS — D509 Iron deficiency anemia, unspecified: Secondary | ICD-10-CM | POA: Diagnosis not present

## 2015-09-03 DIAGNOSIS — N186 End stage renal disease: Secondary | ICD-10-CM | POA: Diagnosis not present

## 2015-09-03 DIAGNOSIS — N2581 Secondary hyperparathyroidism of renal origin: Secondary | ICD-10-CM | POA: Diagnosis not present

## 2015-09-04 DIAGNOSIS — Z79899 Other long term (current) drug therapy: Secondary | ICD-10-CM | POA: Diagnosis not present

## 2015-09-04 DIAGNOSIS — Z992 Dependence on renal dialysis: Secondary | ICD-10-CM | POA: Diagnosis not present

## 2015-09-04 DIAGNOSIS — D631 Anemia in chronic kidney disease: Secondary | ICD-10-CM | POA: Diagnosis not present

## 2015-09-04 DIAGNOSIS — E440 Moderate protein-calorie malnutrition: Secondary | ICD-10-CM | POA: Diagnosis not present

## 2015-09-04 DIAGNOSIS — N186 End stage renal disease: Secondary | ICD-10-CM | POA: Diagnosis not present

## 2015-09-04 DIAGNOSIS — D509 Iron deficiency anemia, unspecified: Secondary | ICD-10-CM | POA: Diagnosis not present

## 2015-09-04 DIAGNOSIS — N2581 Secondary hyperparathyroidism of renal origin: Secondary | ICD-10-CM | POA: Diagnosis not present

## 2015-09-04 DIAGNOSIS — N2889 Other specified disorders of kidney and ureter: Secondary | ICD-10-CM | POA: Diagnosis not present

## 2015-09-05 DIAGNOSIS — N2589 Other disorders resulting from impaired renal tubular function: Secondary | ICD-10-CM | POA: Diagnosis not present

## 2015-09-05 DIAGNOSIS — N2581 Secondary hyperparathyroidism of renal origin: Secondary | ICD-10-CM | POA: Diagnosis not present

## 2015-09-05 DIAGNOSIS — D631 Anemia in chronic kidney disease: Secondary | ICD-10-CM | POA: Diagnosis not present

## 2015-09-05 DIAGNOSIS — K769 Liver disease, unspecified: Secondary | ICD-10-CM | POA: Diagnosis not present

## 2015-09-05 DIAGNOSIS — Z23 Encounter for immunization: Secondary | ICD-10-CM | POA: Diagnosis not present

## 2015-09-05 DIAGNOSIS — N186 End stage renal disease: Secondary | ICD-10-CM | POA: Diagnosis not present

## 2015-09-05 DIAGNOSIS — D509 Iron deficiency anemia, unspecified: Secondary | ICD-10-CM | POA: Diagnosis not present

## 2015-09-06 DIAGNOSIS — N2581 Secondary hyperparathyroidism of renal origin: Secondary | ICD-10-CM | POA: Diagnosis not present

## 2015-09-06 DIAGNOSIS — D631 Anemia in chronic kidney disease: Secondary | ICD-10-CM | POA: Diagnosis not present

## 2015-09-06 DIAGNOSIS — K769 Liver disease, unspecified: Secondary | ICD-10-CM | POA: Diagnosis not present

## 2015-09-06 DIAGNOSIS — Z23 Encounter for immunization: Secondary | ICD-10-CM | POA: Diagnosis not present

## 2015-09-06 DIAGNOSIS — D509 Iron deficiency anemia, unspecified: Secondary | ICD-10-CM | POA: Diagnosis not present

## 2015-09-06 DIAGNOSIS — N186 End stage renal disease: Secondary | ICD-10-CM | POA: Diagnosis not present

## 2015-09-07 DIAGNOSIS — K769 Liver disease, unspecified: Secondary | ICD-10-CM | POA: Diagnosis not present

## 2015-09-07 DIAGNOSIS — N186 End stage renal disease: Secondary | ICD-10-CM | POA: Diagnosis not present

## 2015-09-07 DIAGNOSIS — R8299 Other abnormal findings in urine: Secondary | ICD-10-CM | POA: Diagnosis not present

## 2015-09-07 DIAGNOSIS — Z23 Encounter for immunization: Secondary | ICD-10-CM | POA: Diagnosis not present

## 2015-09-07 DIAGNOSIS — D631 Anemia in chronic kidney disease: Secondary | ICD-10-CM | POA: Diagnosis not present

## 2015-09-07 DIAGNOSIS — N2581 Secondary hyperparathyroidism of renal origin: Secondary | ICD-10-CM | POA: Diagnosis not present

## 2015-09-07 DIAGNOSIS — D509 Iron deficiency anemia, unspecified: Secondary | ICD-10-CM | POA: Diagnosis not present

## 2015-09-07 DIAGNOSIS — E784 Other hyperlipidemia: Secondary | ICD-10-CM | POA: Diagnosis not present

## 2015-09-08 DIAGNOSIS — K769 Liver disease, unspecified: Secondary | ICD-10-CM | POA: Diagnosis not present

## 2015-09-08 DIAGNOSIS — N2581 Secondary hyperparathyroidism of renal origin: Secondary | ICD-10-CM | POA: Diagnosis not present

## 2015-09-08 DIAGNOSIS — D631 Anemia in chronic kidney disease: Secondary | ICD-10-CM | POA: Diagnosis not present

## 2015-09-08 DIAGNOSIS — Z23 Encounter for immunization: Secondary | ICD-10-CM | POA: Diagnosis not present

## 2015-09-08 DIAGNOSIS — D509 Iron deficiency anemia, unspecified: Secondary | ICD-10-CM | POA: Diagnosis not present

## 2015-09-08 DIAGNOSIS — N186 End stage renal disease: Secondary | ICD-10-CM | POA: Diagnosis not present

## 2015-09-09 DIAGNOSIS — K769 Liver disease, unspecified: Secondary | ICD-10-CM | POA: Diagnosis not present

## 2015-09-09 DIAGNOSIS — N186 End stage renal disease: Secondary | ICD-10-CM | POA: Diagnosis not present

## 2015-09-09 DIAGNOSIS — Z23 Encounter for immunization: Secondary | ICD-10-CM | POA: Diagnosis not present

## 2015-09-09 DIAGNOSIS — D631 Anemia in chronic kidney disease: Secondary | ICD-10-CM | POA: Diagnosis not present

## 2015-09-09 DIAGNOSIS — N2581 Secondary hyperparathyroidism of renal origin: Secondary | ICD-10-CM | POA: Diagnosis not present

## 2015-09-09 DIAGNOSIS — D509 Iron deficiency anemia, unspecified: Secondary | ICD-10-CM | POA: Diagnosis not present

## 2015-09-10 DIAGNOSIS — D631 Anemia in chronic kidney disease: Secondary | ICD-10-CM | POA: Diagnosis not present

## 2015-09-10 DIAGNOSIS — D509 Iron deficiency anemia, unspecified: Secondary | ICD-10-CM | POA: Diagnosis not present

## 2015-09-10 DIAGNOSIS — N2581 Secondary hyperparathyroidism of renal origin: Secondary | ICD-10-CM | POA: Diagnosis not present

## 2015-09-10 DIAGNOSIS — N186 End stage renal disease: Secondary | ICD-10-CM | POA: Diagnosis not present

## 2015-09-10 DIAGNOSIS — Z23 Encounter for immunization: Secondary | ICD-10-CM | POA: Diagnosis not present

## 2015-09-10 DIAGNOSIS — K769 Liver disease, unspecified: Secondary | ICD-10-CM | POA: Diagnosis not present

## 2015-09-11 DIAGNOSIS — K769 Liver disease, unspecified: Secondary | ICD-10-CM | POA: Diagnosis not present

## 2015-09-11 DIAGNOSIS — Z23 Encounter for immunization: Secondary | ICD-10-CM | POA: Diagnosis not present

## 2015-09-11 DIAGNOSIS — N186 End stage renal disease: Secondary | ICD-10-CM | POA: Diagnosis not present

## 2015-09-11 DIAGNOSIS — N2581 Secondary hyperparathyroidism of renal origin: Secondary | ICD-10-CM | POA: Diagnosis not present

## 2015-09-11 DIAGNOSIS — D509 Iron deficiency anemia, unspecified: Secondary | ICD-10-CM | POA: Diagnosis not present

## 2015-09-11 DIAGNOSIS — D631 Anemia in chronic kidney disease: Secondary | ICD-10-CM | POA: Diagnosis not present

## 2015-09-12 DIAGNOSIS — D509 Iron deficiency anemia, unspecified: Secondary | ICD-10-CM | POA: Diagnosis not present

## 2015-09-12 DIAGNOSIS — K769 Liver disease, unspecified: Secondary | ICD-10-CM | POA: Diagnosis not present

## 2015-09-12 DIAGNOSIS — D631 Anemia in chronic kidney disease: Secondary | ICD-10-CM | POA: Diagnosis not present

## 2015-09-12 DIAGNOSIS — N2581 Secondary hyperparathyroidism of renal origin: Secondary | ICD-10-CM | POA: Diagnosis not present

## 2015-09-12 DIAGNOSIS — N186 End stage renal disease: Secondary | ICD-10-CM | POA: Diagnosis not present

## 2015-09-12 DIAGNOSIS — Z23 Encounter for immunization: Secondary | ICD-10-CM | POA: Diagnosis not present

## 2015-09-13 DIAGNOSIS — N2581 Secondary hyperparathyroidism of renal origin: Secondary | ICD-10-CM | POA: Diagnosis not present

## 2015-09-13 DIAGNOSIS — K769 Liver disease, unspecified: Secondary | ICD-10-CM | POA: Diagnosis not present

## 2015-09-13 DIAGNOSIS — N186 End stage renal disease: Secondary | ICD-10-CM | POA: Diagnosis not present

## 2015-09-13 DIAGNOSIS — D509 Iron deficiency anemia, unspecified: Secondary | ICD-10-CM | POA: Diagnosis not present

## 2015-09-13 DIAGNOSIS — Z23 Encounter for immunization: Secondary | ICD-10-CM | POA: Diagnosis not present

## 2015-09-13 DIAGNOSIS — D631 Anemia in chronic kidney disease: Secondary | ICD-10-CM | POA: Diagnosis not present

## 2015-09-14 DIAGNOSIS — K769 Liver disease, unspecified: Secondary | ICD-10-CM | POA: Diagnosis not present

## 2015-09-14 DIAGNOSIS — N186 End stage renal disease: Secondary | ICD-10-CM | POA: Diagnosis not present

## 2015-09-14 DIAGNOSIS — Z23 Encounter for immunization: Secondary | ICD-10-CM | POA: Diagnosis not present

## 2015-09-14 DIAGNOSIS — D509 Iron deficiency anemia, unspecified: Secondary | ICD-10-CM | POA: Diagnosis not present

## 2015-09-14 DIAGNOSIS — N2581 Secondary hyperparathyroidism of renal origin: Secondary | ICD-10-CM | POA: Diagnosis not present

## 2015-09-14 DIAGNOSIS — D631 Anemia in chronic kidney disease: Secondary | ICD-10-CM | POA: Diagnosis not present

## 2015-09-15 DIAGNOSIS — K769 Liver disease, unspecified: Secondary | ICD-10-CM | POA: Diagnosis not present

## 2015-09-15 DIAGNOSIS — Z23 Encounter for immunization: Secondary | ICD-10-CM | POA: Diagnosis not present

## 2015-09-15 DIAGNOSIS — D631 Anemia in chronic kidney disease: Secondary | ICD-10-CM | POA: Diagnosis not present

## 2015-09-15 DIAGNOSIS — N2581 Secondary hyperparathyroidism of renal origin: Secondary | ICD-10-CM | POA: Diagnosis not present

## 2015-09-15 DIAGNOSIS — N186 End stage renal disease: Secondary | ICD-10-CM | POA: Diagnosis not present

## 2015-09-15 DIAGNOSIS — D509 Iron deficiency anemia, unspecified: Secondary | ICD-10-CM | POA: Diagnosis not present

## 2015-09-16 DIAGNOSIS — N2581 Secondary hyperparathyroidism of renal origin: Secondary | ICD-10-CM | POA: Diagnosis not present

## 2015-09-16 DIAGNOSIS — N186 End stage renal disease: Secondary | ICD-10-CM | POA: Diagnosis not present

## 2015-09-16 DIAGNOSIS — Z23 Encounter for immunization: Secondary | ICD-10-CM | POA: Diagnosis not present

## 2015-09-16 DIAGNOSIS — D631 Anemia in chronic kidney disease: Secondary | ICD-10-CM | POA: Diagnosis not present

## 2015-09-16 DIAGNOSIS — K769 Liver disease, unspecified: Secondary | ICD-10-CM | POA: Diagnosis not present

## 2015-09-16 DIAGNOSIS — D509 Iron deficiency anemia, unspecified: Secondary | ICD-10-CM | POA: Diagnosis not present

## 2015-09-17 DIAGNOSIS — N2581 Secondary hyperparathyroidism of renal origin: Secondary | ICD-10-CM | POA: Diagnosis not present

## 2015-09-17 DIAGNOSIS — D509 Iron deficiency anemia, unspecified: Secondary | ICD-10-CM | POA: Diagnosis not present

## 2015-09-17 DIAGNOSIS — K769 Liver disease, unspecified: Secondary | ICD-10-CM | POA: Diagnosis not present

## 2015-09-17 DIAGNOSIS — Z23 Encounter for immunization: Secondary | ICD-10-CM | POA: Diagnosis not present

## 2015-09-17 DIAGNOSIS — D631 Anemia in chronic kidney disease: Secondary | ICD-10-CM | POA: Diagnosis not present

## 2015-09-17 DIAGNOSIS — N186 End stage renal disease: Secondary | ICD-10-CM | POA: Diagnosis not present

## 2015-09-18 DIAGNOSIS — N2581 Secondary hyperparathyroidism of renal origin: Secondary | ICD-10-CM | POA: Diagnosis not present

## 2015-09-18 DIAGNOSIS — N186 End stage renal disease: Secondary | ICD-10-CM | POA: Diagnosis not present

## 2015-09-18 DIAGNOSIS — D631 Anemia in chronic kidney disease: Secondary | ICD-10-CM | POA: Diagnosis not present

## 2015-09-18 DIAGNOSIS — Z23 Encounter for immunization: Secondary | ICD-10-CM | POA: Diagnosis not present

## 2015-09-18 DIAGNOSIS — D509 Iron deficiency anemia, unspecified: Secondary | ICD-10-CM | POA: Diagnosis not present

## 2015-09-18 DIAGNOSIS — K769 Liver disease, unspecified: Secondary | ICD-10-CM | POA: Diagnosis not present

## 2015-09-19 DIAGNOSIS — N2581 Secondary hyperparathyroidism of renal origin: Secondary | ICD-10-CM | POA: Diagnosis not present

## 2015-09-19 DIAGNOSIS — D509 Iron deficiency anemia, unspecified: Secondary | ICD-10-CM | POA: Diagnosis not present

## 2015-09-19 DIAGNOSIS — K769 Liver disease, unspecified: Secondary | ICD-10-CM | POA: Diagnosis not present

## 2015-09-19 DIAGNOSIS — N186 End stage renal disease: Secondary | ICD-10-CM | POA: Diagnosis not present

## 2015-09-19 DIAGNOSIS — Z23 Encounter for immunization: Secondary | ICD-10-CM | POA: Diagnosis not present

## 2015-09-19 DIAGNOSIS — D631 Anemia in chronic kidney disease: Secondary | ICD-10-CM | POA: Diagnosis not present

## 2015-09-20 DIAGNOSIS — N2581 Secondary hyperparathyroidism of renal origin: Secondary | ICD-10-CM | POA: Diagnosis not present

## 2015-09-20 DIAGNOSIS — N186 End stage renal disease: Secondary | ICD-10-CM | POA: Diagnosis not present

## 2015-09-20 DIAGNOSIS — K769 Liver disease, unspecified: Secondary | ICD-10-CM | POA: Diagnosis not present

## 2015-09-20 DIAGNOSIS — Z23 Encounter for immunization: Secondary | ICD-10-CM | POA: Diagnosis not present

## 2015-09-20 DIAGNOSIS — D631 Anemia in chronic kidney disease: Secondary | ICD-10-CM | POA: Diagnosis not present

## 2015-09-20 DIAGNOSIS — D509 Iron deficiency anemia, unspecified: Secondary | ICD-10-CM | POA: Diagnosis not present

## 2015-09-21 DIAGNOSIS — N2581 Secondary hyperparathyroidism of renal origin: Secondary | ICD-10-CM | POA: Diagnosis not present

## 2015-09-21 DIAGNOSIS — D631 Anemia in chronic kidney disease: Secondary | ICD-10-CM | POA: Diagnosis not present

## 2015-09-21 DIAGNOSIS — D509 Iron deficiency anemia, unspecified: Secondary | ICD-10-CM | POA: Diagnosis not present

## 2015-09-21 DIAGNOSIS — Z23 Encounter for immunization: Secondary | ICD-10-CM | POA: Diagnosis not present

## 2015-09-21 DIAGNOSIS — K769 Liver disease, unspecified: Secondary | ICD-10-CM | POA: Diagnosis not present

## 2015-09-21 DIAGNOSIS — N186 End stage renal disease: Secondary | ICD-10-CM | POA: Diagnosis not present

## 2015-09-22 DIAGNOSIS — D509 Iron deficiency anemia, unspecified: Secondary | ICD-10-CM | POA: Diagnosis not present

## 2015-09-22 DIAGNOSIS — Z23 Encounter for immunization: Secondary | ICD-10-CM | POA: Diagnosis not present

## 2015-09-22 DIAGNOSIS — N186 End stage renal disease: Secondary | ICD-10-CM | POA: Diagnosis not present

## 2015-09-22 DIAGNOSIS — D631 Anemia in chronic kidney disease: Secondary | ICD-10-CM | POA: Diagnosis not present

## 2015-09-22 DIAGNOSIS — N2581 Secondary hyperparathyroidism of renal origin: Secondary | ICD-10-CM | POA: Diagnosis not present

## 2015-09-22 DIAGNOSIS — K769 Liver disease, unspecified: Secondary | ICD-10-CM | POA: Diagnosis not present

## 2015-09-23 DIAGNOSIS — D509 Iron deficiency anemia, unspecified: Secondary | ICD-10-CM | POA: Diagnosis not present

## 2015-09-23 DIAGNOSIS — N2581 Secondary hyperparathyroidism of renal origin: Secondary | ICD-10-CM | POA: Diagnosis not present

## 2015-09-23 DIAGNOSIS — N186 End stage renal disease: Secondary | ICD-10-CM | POA: Diagnosis not present

## 2015-09-23 DIAGNOSIS — Z23 Encounter for immunization: Secondary | ICD-10-CM | POA: Diagnosis not present

## 2015-09-23 DIAGNOSIS — K769 Liver disease, unspecified: Secondary | ICD-10-CM | POA: Diagnosis not present

## 2015-09-23 DIAGNOSIS — D631 Anemia in chronic kidney disease: Secondary | ICD-10-CM | POA: Diagnosis not present

## 2015-09-24 DIAGNOSIS — Z23 Encounter for immunization: Secondary | ICD-10-CM | POA: Diagnosis not present

## 2015-09-24 DIAGNOSIS — N186 End stage renal disease: Secondary | ICD-10-CM | POA: Diagnosis not present

## 2015-09-24 DIAGNOSIS — D509 Iron deficiency anemia, unspecified: Secondary | ICD-10-CM | POA: Diagnosis not present

## 2015-09-24 DIAGNOSIS — N2581 Secondary hyperparathyroidism of renal origin: Secondary | ICD-10-CM | POA: Diagnosis not present

## 2015-09-24 DIAGNOSIS — K769 Liver disease, unspecified: Secondary | ICD-10-CM | POA: Diagnosis not present

## 2015-09-24 DIAGNOSIS — D631 Anemia in chronic kidney disease: Secondary | ICD-10-CM | POA: Diagnosis not present

## 2015-09-25 DIAGNOSIS — Z23 Encounter for immunization: Secondary | ICD-10-CM | POA: Diagnosis not present

## 2015-09-25 DIAGNOSIS — N2581 Secondary hyperparathyroidism of renal origin: Secondary | ICD-10-CM | POA: Diagnosis not present

## 2015-09-25 DIAGNOSIS — K769 Liver disease, unspecified: Secondary | ICD-10-CM | POA: Diagnosis not present

## 2015-09-25 DIAGNOSIS — D631 Anemia in chronic kidney disease: Secondary | ICD-10-CM | POA: Diagnosis not present

## 2015-09-25 DIAGNOSIS — D509 Iron deficiency anemia, unspecified: Secondary | ICD-10-CM | POA: Diagnosis not present

## 2015-09-25 DIAGNOSIS — N186 End stage renal disease: Secondary | ICD-10-CM | POA: Diagnosis not present

## 2015-09-26 DIAGNOSIS — N186 End stage renal disease: Secondary | ICD-10-CM | POA: Diagnosis not present

## 2015-09-26 DIAGNOSIS — Z23 Encounter for immunization: Secondary | ICD-10-CM | POA: Diagnosis not present

## 2015-09-26 DIAGNOSIS — K769 Liver disease, unspecified: Secondary | ICD-10-CM | POA: Diagnosis not present

## 2015-09-26 DIAGNOSIS — D509 Iron deficiency anemia, unspecified: Secondary | ICD-10-CM | POA: Diagnosis not present

## 2015-09-26 DIAGNOSIS — N2581 Secondary hyperparathyroidism of renal origin: Secondary | ICD-10-CM | POA: Diagnosis not present

## 2015-09-26 DIAGNOSIS — D631 Anemia in chronic kidney disease: Secondary | ICD-10-CM | POA: Diagnosis not present

## 2015-09-27 DIAGNOSIS — N186 End stage renal disease: Secondary | ICD-10-CM | POA: Diagnosis not present

## 2015-09-27 DIAGNOSIS — D631 Anemia in chronic kidney disease: Secondary | ICD-10-CM | POA: Diagnosis not present

## 2015-09-27 DIAGNOSIS — K769 Liver disease, unspecified: Secondary | ICD-10-CM | POA: Diagnosis not present

## 2015-09-27 DIAGNOSIS — D509 Iron deficiency anemia, unspecified: Secondary | ICD-10-CM | POA: Diagnosis not present

## 2015-09-27 DIAGNOSIS — N2581 Secondary hyperparathyroidism of renal origin: Secondary | ICD-10-CM | POA: Diagnosis not present

## 2015-09-27 DIAGNOSIS — Z23 Encounter for immunization: Secondary | ICD-10-CM | POA: Diagnosis not present

## 2015-09-28 DIAGNOSIS — D509 Iron deficiency anemia, unspecified: Secondary | ICD-10-CM | POA: Diagnosis not present

## 2015-09-28 DIAGNOSIS — K769 Liver disease, unspecified: Secondary | ICD-10-CM | POA: Diagnosis not present

## 2015-09-28 DIAGNOSIS — N186 End stage renal disease: Secondary | ICD-10-CM | POA: Diagnosis not present

## 2015-09-28 DIAGNOSIS — Z23 Encounter for immunization: Secondary | ICD-10-CM | POA: Diagnosis not present

## 2015-09-28 DIAGNOSIS — D631 Anemia in chronic kidney disease: Secondary | ICD-10-CM | POA: Diagnosis not present

## 2015-09-28 DIAGNOSIS — N2581 Secondary hyperparathyroidism of renal origin: Secondary | ICD-10-CM | POA: Diagnosis not present

## 2015-09-29 DIAGNOSIS — D631 Anemia in chronic kidney disease: Secondary | ICD-10-CM | POA: Diagnosis not present

## 2015-09-29 DIAGNOSIS — K769 Liver disease, unspecified: Secondary | ICD-10-CM | POA: Diagnosis not present

## 2015-09-29 DIAGNOSIS — N186 End stage renal disease: Secondary | ICD-10-CM | POA: Diagnosis not present

## 2015-09-29 DIAGNOSIS — N2581 Secondary hyperparathyroidism of renal origin: Secondary | ICD-10-CM | POA: Diagnosis not present

## 2015-09-29 DIAGNOSIS — D509 Iron deficiency anemia, unspecified: Secondary | ICD-10-CM | POA: Diagnosis not present

## 2015-09-29 DIAGNOSIS — Z23 Encounter for immunization: Secondary | ICD-10-CM | POA: Diagnosis not present

## 2015-09-30 DIAGNOSIS — N186 End stage renal disease: Secondary | ICD-10-CM | POA: Diagnosis not present

## 2015-09-30 DIAGNOSIS — D631 Anemia in chronic kidney disease: Secondary | ICD-10-CM | POA: Diagnosis not present

## 2015-09-30 DIAGNOSIS — N2581 Secondary hyperparathyroidism of renal origin: Secondary | ICD-10-CM | POA: Diagnosis not present

## 2015-09-30 DIAGNOSIS — Z23 Encounter for immunization: Secondary | ICD-10-CM | POA: Diagnosis not present

## 2015-09-30 DIAGNOSIS — K769 Liver disease, unspecified: Secondary | ICD-10-CM | POA: Diagnosis not present

## 2015-09-30 DIAGNOSIS — D509 Iron deficiency anemia, unspecified: Secondary | ICD-10-CM | POA: Diagnosis not present

## 2015-10-01 DIAGNOSIS — K769 Liver disease, unspecified: Secondary | ICD-10-CM | POA: Diagnosis not present

## 2015-10-01 DIAGNOSIS — Z23 Encounter for immunization: Secondary | ICD-10-CM | POA: Diagnosis not present

## 2015-10-01 DIAGNOSIS — N2581 Secondary hyperparathyroidism of renal origin: Secondary | ICD-10-CM | POA: Diagnosis not present

## 2015-10-01 DIAGNOSIS — N186 End stage renal disease: Secondary | ICD-10-CM | POA: Diagnosis not present

## 2015-10-01 DIAGNOSIS — D509 Iron deficiency anemia, unspecified: Secondary | ICD-10-CM | POA: Diagnosis not present

## 2015-10-01 DIAGNOSIS — D631 Anemia in chronic kidney disease: Secondary | ICD-10-CM | POA: Diagnosis not present

## 2015-10-02 DIAGNOSIS — N186 End stage renal disease: Secondary | ICD-10-CM | POA: Diagnosis not present

## 2015-10-02 DIAGNOSIS — N2581 Secondary hyperparathyroidism of renal origin: Secondary | ICD-10-CM | POA: Diagnosis not present

## 2015-10-02 DIAGNOSIS — D509 Iron deficiency anemia, unspecified: Secondary | ICD-10-CM | POA: Diagnosis not present

## 2015-10-02 DIAGNOSIS — D631 Anemia in chronic kidney disease: Secondary | ICD-10-CM | POA: Diagnosis not present

## 2015-10-02 DIAGNOSIS — K769 Liver disease, unspecified: Secondary | ICD-10-CM | POA: Diagnosis not present

## 2015-10-02 DIAGNOSIS — Z23 Encounter for immunization: Secondary | ICD-10-CM | POA: Diagnosis not present

## 2015-10-03 DIAGNOSIS — D509 Iron deficiency anemia, unspecified: Secondary | ICD-10-CM | POA: Diagnosis not present

## 2015-10-03 DIAGNOSIS — D631 Anemia in chronic kidney disease: Secondary | ICD-10-CM | POA: Diagnosis not present

## 2015-10-03 DIAGNOSIS — N2581 Secondary hyperparathyroidism of renal origin: Secondary | ICD-10-CM | POA: Diagnosis not present

## 2015-10-03 DIAGNOSIS — K769 Liver disease, unspecified: Secondary | ICD-10-CM | POA: Diagnosis not present

## 2015-10-03 DIAGNOSIS — N186 End stage renal disease: Secondary | ICD-10-CM | POA: Diagnosis not present

## 2015-10-03 DIAGNOSIS — Z23 Encounter for immunization: Secondary | ICD-10-CM | POA: Diagnosis not present

## 2015-10-04 DIAGNOSIS — N2581 Secondary hyperparathyroidism of renal origin: Secondary | ICD-10-CM | POA: Diagnosis not present

## 2015-10-04 DIAGNOSIS — Z23 Encounter for immunization: Secondary | ICD-10-CM | POA: Diagnosis not present

## 2015-10-04 DIAGNOSIS — K769 Liver disease, unspecified: Secondary | ICD-10-CM | POA: Diagnosis not present

## 2015-10-04 DIAGNOSIS — D631 Anemia in chronic kidney disease: Secondary | ICD-10-CM | POA: Diagnosis not present

## 2015-10-04 DIAGNOSIS — D509 Iron deficiency anemia, unspecified: Secondary | ICD-10-CM | POA: Diagnosis not present

## 2015-10-04 DIAGNOSIS — N186 End stage renal disease: Secondary | ICD-10-CM | POA: Diagnosis not present

## 2015-10-05 ENCOUNTER — Other Ambulatory Visit (HOSPITAL_COMMUNITY): Payer: Self-pay | Admitting: Internal Medicine

## 2015-10-05 DIAGNOSIS — Z23 Encounter for immunization: Secondary | ICD-10-CM | POA: Diagnosis not present

## 2015-10-05 DIAGNOSIS — N2889 Other specified disorders of kidney and ureter: Secondary | ICD-10-CM | POA: Diagnosis not present

## 2015-10-05 DIAGNOSIS — D509 Iron deficiency anemia, unspecified: Secondary | ICD-10-CM | POA: Diagnosis not present

## 2015-10-05 DIAGNOSIS — I129 Hypertensive chronic kidney disease with stage 1 through stage 4 chronic kidney disease, or unspecified chronic kidney disease: Secondary | ICD-10-CM

## 2015-10-05 DIAGNOSIS — Z992 Dependence on renal dialysis: Secondary | ICD-10-CM | POA: Diagnosis not present

## 2015-10-05 DIAGNOSIS — D631 Anemia in chronic kidney disease: Secondary | ICD-10-CM | POA: Diagnosis not present

## 2015-10-05 DIAGNOSIS — N186 End stage renal disease: Secondary | ICD-10-CM | POA: Diagnosis not present

## 2015-10-05 DIAGNOSIS — N2581 Secondary hyperparathyroidism of renal origin: Secondary | ICD-10-CM | POA: Diagnosis not present

## 2015-10-05 DIAGNOSIS — N184 Chronic kidney disease, stage 4 (severe): Principal | ICD-10-CM

## 2015-10-05 DIAGNOSIS — K769 Liver disease, unspecified: Secondary | ICD-10-CM | POA: Diagnosis not present

## 2015-10-06 DIAGNOSIS — D509 Iron deficiency anemia, unspecified: Secondary | ICD-10-CM | POA: Diagnosis not present

## 2015-10-06 DIAGNOSIS — K769 Liver disease, unspecified: Secondary | ICD-10-CM | POA: Diagnosis not present

## 2015-10-06 DIAGNOSIS — Z79899 Other long term (current) drug therapy: Secondary | ICD-10-CM | POA: Diagnosis not present

## 2015-10-06 DIAGNOSIS — D631 Anemia in chronic kidney disease: Secondary | ICD-10-CM | POA: Diagnosis not present

## 2015-10-06 DIAGNOSIS — E440 Moderate protein-calorie malnutrition: Secondary | ICD-10-CM | POA: Diagnosis not present

## 2015-10-06 DIAGNOSIS — N186 End stage renal disease: Secondary | ICD-10-CM | POA: Diagnosis not present

## 2015-10-07 DIAGNOSIS — Z79899 Other long term (current) drug therapy: Secondary | ICD-10-CM | POA: Diagnosis not present

## 2015-10-07 DIAGNOSIS — D509 Iron deficiency anemia, unspecified: Secondary | ICD-10-CM | POA: Diagnosis not present

## 2015-10-07 DIAGNOSIS — D631 Anemia in chronic kidney disease: Secondary | ICD-10-CM | POA: Diagnosis not present

## 2015-10-07 DIAGNOSIS — R8299 Other abnormal findings in urine: Secondary | ICD-10-CM | POA: Diagnosis not present

## 2015-10-07 DIAGNOSIS — K769 Liver disease, unspecified: Secondary | ICD-10-CM | POA: Diagnosis not present

## 2015-10-07 DIAGNOSIS — N186 End stage renal disease: Secondary | ICD-10-CM | POA: Diagnosis not present

## 2015-10-07 DIAGNOSIS — E440 Moderate protein-calorie malnutrition: Secondary | ICD-10-CM | POA: Diagnosis not present

## 2015-10-08 DIAGNOSIS — D631 Anemia in chronic kidney disease: Secondary | ICD-10-CM | POA: Diagnosis not present

## 2015-10-08 DIAGNOSIS — E440 Moderate protein-calorie malnutrition: Secondary | ICD-10-CM | POA: Diagnosis not present

## 2015-10-08 DIAGNOSIS — N186 End stage renal disease: Secondary | ICD-10-CM | POA: Diagnosis not present

## 2015-10-08 DIAGNOSIS — D509 Iron deficiency anemia, unspecified: Secondary | ICD-10-CM | POA: Diagnosis not present

## 2015-10-08 DIAGNOSIS — Z79899 Other long term (current) drug therapy: Secondary | ICD-10-CM | POA: Diagnosis not present

## 2015-10-08 DIAGNOSIS — K769 Liver disease, unspecified: Secondary | ICD-10-CM | POA: Diagnosis not present

## 2015-10-09 DIAGNOSIS — D509 Iron deficiency anemia, unspecified: Secondary | ICD-10-CM | POA: Diagnosis not present

## 2015-10-09 DIAGNOSIS — E440 Moderate protein-calorie malnutrition: Secondary | ICD-10-CM | POA: Diagnosis not present

## 2015-10-09 DIAGNOSIS — Z79899 Other long term (current) drug therapy: Secondary | ICD-10-CM | POA: Diagnosis not present

## 2015-10-09 DIAGNOSIS — N186 End stage renal disease: Secondary | ICD-10-CM | POA: Diagnosis not present

## 2015-10-09 DIAGNOSIS — D631 Anemia in chronic kidney disease: Secondary | ICD-10-CM | POA: Diagnosis not present

## 2015-10-09 DIAGNOSIS — K769 Liver disease, unspecified: Secondary | ICD-10-CM | POA: Diagnosis not present

## 2015-10-10 DIAGNOSIS — K769 Liver disease, unspecified: Secondary | ICD-10-CM | POA: Diagnosis not present

## 2015-10-10 DIAGNOSIS — E440 Moderate protein-calorie malnutrition: Secondary | ICD-10-CM | POA: Diagnosis not present

## 2015-10-10 DIAGNOSIS — D631 Anemia in chronic kidney disease: Secondary | ICD-10-CM | POA: Diagnosis not present

## 2015-10-10 DIAGNOSIS — D509 Iron deficiency anemia, unspecified: Secondary | ICD-10-CM | POA: Diagnosis not present

## 2015-10-10 DIAGNOSIS — Z79899 Other long term (current) drug therapy: Secondary | ICD-10-CM | POA: Diagnosis not present

## 2015-10-10 DIAGNOSIS — N186 End stage renal disease: Secondary | ICD-10-CM | POA: Diagnosis not present

## 2015-10-11 DIAGNOSIS — Z79899 Other long term (current) drug therapy: Secondary | ICD-10-CM | POA: Diagnosis not present

## 2015-10-11 DIAGNOSIS — E440 Moderate protein-calorie malnutrition: Secondary | ICD-10-CM | POA: Diagnosis not present

## 2015-10-11 DIAGNOSIS — N186 End stage renal disease: Secondary | ICD-10-CM | POA: Diagnosis not present

## 2015-10-11 DIAGNOSIS — D631 Anemia in chronic kidney disease: Secondary | ICD-10-CM | POA: Diagnosis not present

## 2015-10-11 DIAGNOSIS — K769 Liver disease, unspecified: Secondary | ICD-10-CM | POA: Diagnosis not present

## 2015-10-11 DIAGNOSIS — D509 Iron deficiency anemia, unspecified: Secondary | ICD-10-CM | POA: Diagnosis not present

## 2015-10-12 DIAGNOSIS — D631 Anemia in chronic kidney disease: Secondary | ICD-10-CM | POA: Diagnosis not present

## 2015-10-12 DIAGNOSIS — D509 Iron deficiency anemia, unspecified: Secondary | ICD-10-CM | POA: Diagnosis not present

## 2015-10-12 DIAGNOSIS — N186 End stage renal disease: Secondary | ICD-10-CM | POA: Diagnosis not present

## 2015-10-12 DIAGNOSIS — Z79899 Other long term (current) drug therapy: Secondary | ICD-10-CM | POA: Diagnosis not present

## 2015-10-12 DIAGNOSIS — E440 Moderate protein-calorie malnutrition: Secondary | ICD-10-CM | POA: Diagnosis not present

## 2015-10-12 DIAGNOSIS — K769 Liver disease, unspecified: Secondary | ICD-10-CM | POA: Diagnosis not present

## 2015-10-13 ENCOUNTER — Telehealth (HOSPITAL_COMMUNITY): Payer: Self-pay

## 2015-10-13 DIAGNOSIS — N186 End stage renal disease: Secondary | ICD-10-CM | POA: Diagnosis not present

## 2015-10-13 DIAGNOSIS — D631 Anemia in chronic kidney disease: Secondary | ICD-10-CM | POA: Diagnosis not present

## 2015-10-13 DIAGNOSIS — D509 Iron deficiency anemia, unspecified: Secondary | ICD-10-CM | POA: Diagnosis not present

## 2015-10-13 DIAGNOSIS — E440 Moderate protein-calorie malnutrition: Secondary | ICD-10-CM | POA: Diagnosis not present

## 2015-10-13 DIAGNOSIS — K769 Liver disease, unspecified: Secondary | ICD-10-CM | POA: Diagnosis not present

## 2015-10-13 DIAGNOSIS — Z79899 Other long term (current) drug therapy: Secondary | ICD-10-CM | POA: Diagnosis not present

## 2015-10-13 NOTE — Telephone Encounter (Signed)
Encounter complete. 

## 2015-10-14 DIAGNOSIS — E440 Moderate protein-calorie malnutrition: Secondary | ICD-10-CM | POA: Diagnosis not present

## 2015-10-14 DIAGNOSIS — N186 End stage renal disease: Secondary | ICD-10-CM | POA: Diagnosis not present

## 2015-10-14 DIAGNOSIS — K769 Liver disease, unspecified: Secondary | ICD-10-CM | POA: Diagnosis not present

## 2015-10-14 DIAGNOSIS — D509 Iron deficiency anemia, unspecified: Secondary | ICD-10-CM | POA: Diagnosis not present

## 2015-10-14 DIAGNOSIS — D631 Anemia in chronic kidney disease: Secondary | ICD-10-CM | POA: Diagnosis not present

## 2015-10-14 DIAGNOSIS — Z79899 Other long term (current) drug therapy: Secondary | ICD-10-CM | POA: Diagnosis not present

## 2015-10-15 ENCOUNTER — Ambulatory Visit (HOSPITAL_COMMUNITY)
Admission: RE | Admit: 2015-10-15 | Discharge: 2015-10-15 | Disposition: A | Discharge status: Home / Self Care | Payer: BLUE CROSS/BLUE SHIELD | Source: Ambulatory Visit | Attending: Cardiovascular Disease | Admitting: Cardiovascular Disease

## 2015-10-15 ENCOUNTER — Encounter (HOSPITAL_COMMUNITY): Payer: Self-pay | Admitting: *Deleted

## 2015-10-15 ENCOUNTER — Ambulatory Visit (HOSPITAL_BASED_OUTPATIENT_CLINIC_OR_DEPARTMENT_OTHER): Payer: BLUE CROSS/BLUE SHIELD

## 2015-10-15 DIAGNOSIS — E440 Moderate protein-calorie malnutrition: Secondary | ICD-10-CM | POA: Diagnosis not present

## 2015-10-15 DIAGNOSIS — R9439 Abnormal result of other cardiovascular function study: Secondary | ICD-10-CM | POA: Diagnosis not present

## 2015-10-15 DIAGNOSIS — D631 Anemia in chronic kidney disease: Secondary | ICD-10-CM | POA: Diagnosis not present

## 2015-10-15 DIAGNOSIS — R29898 Other symptoms and signs involving the musculoskeletal system: Secondary | ICD-10-CM | POA: Insufficient documentation

## 2015-10-15 DIAGNOSIS — R5383 Other fatigue: Secondary | ICD-10-CM | POA: Diagnosis not present

## 2015-10-15 DIAGNOSIS — I129 Hypertensive chronic kidney disease with stage 1 through stage 4 chronic kidney disease, or unspecified chronic kidney disease: Secondary | ICD-10-CM | POA: Diagnosis not present

## 2015-10-15 DIAGNOSIS — D509 Iron deficiency anemia, unspecified: Secondary | ICD-10-CM | POA: Diagnosis not present

## 2015-10-15 DIAGNOSIS — N184 Chronic kidney disease, stage 4 (severe): Secondary | ICD-10-CM | POA: Diagnosis not present

## 2015-10-15 DIAGNOSIS — N186 End stage renal disease: Secondary | ICD-10-CM | POA: Diagnosis not present

## 2015-10-15 DIAGNOSIS — Z79899 Other long term (current) drug therapy: Secondary | ICD-10-CM | POA: Diagnosis not present

## 2015-10-15 DIAGNOSIS — K769 Liver disease, unspecified: Secondary | ICD-10-CM | POA: Diagnosis not present

## 2015-10-15 HISTORY — PX: CARDIOVASCULAR STRESS TEST: SHX262

## 2015-10-15 HISTORY — PX: TRANSTHORACIC ECHOCARDIOGRAM: SHX275

## 2015-10-15 LAB — MYOCARDIAL PERFUSION IMAGING
CHL CUP NUCLEAR SSS: 3
CHL CUP RESTING HR STRESS: 78 {beats}/min
CSEPPHR: 107 {beats}/min
LVDIAVOL: 57 mL
LVSYSVOL: 24 mL
NUC STRESS TID: 0.92
SDS: 3

## 2015-10-15 MED ORDER — AMINOPHYLLINE 25 MG/ML IV SOLN
100.0000 mg | Freq: Once | INTRAVENOUS | Status: AC
  Administered 2015-10-15: 100 mg via INTRAVENOUS

## 2015-10-15 MED ORDER — TECHNETIUM TC 99M SESTAMIBI GENERIC - CARDIOLITE
31.0000 | Freq: Once | INTRAVENOUS | Status: AC | PRN
  Administered 2015-10-15: 31 via INTRAVENOUS

## 2015-10-15 MED ORDER — TECHNETIUM TC 99M SESTAMIBI GENERIC - CARDIOLITE
10.7000 | Freq: Once | INTRAVENOUS | Status: AC | PRN
  Administered 2015-10-15: 11 via INTRAVENOUS

## 2015-10-15 MED ORDER — REGADENOSON 0.4 MG/5ML IV SOLN
0.4000 mg | Freq: Once | INTRAVENOUS | Status: AC
  Administered 2015-10-15: 0.4 mg via INTRAVENOUS

## 2015-10-15 NOTE — Progress Notes (Unsigned)
Patient ID: Yvette Jenkins, female   DOB: 1956-05-04, 60 y.o.   MRN: [id number] Approximately 35 minutes of wait period Patient developed Nausea with dry heaves.  She was taken back into the Stress room and Pam gave her Aminophylline IV.  Nausea and heaved subsided in approximately 8-10 minutes.

## 2015-10-16 DIAGNOSIS — D509 Iron deficiency anemia, unspecified: Secondary | ICD-10-CM | POA: Diagnosis not present

## 2015-10-16 DIAGNOSIS — Z79899 Other long term (current) drug therapy: Secondary | ICD-10-CM | POA: Diagnosis not present

## 2015-10-16 DIAGNOSIS — N186 End stage renal disease: Secondary | ICD-10-CM | POA: Diagnosis not present

## 2015-10-16 DIAGNOSIS — D631 Anemia in chronic kidney disease: Secondary | ICD-10-CM | POA: Diagnosis not present

## 2015-10-16 DIAGNOSIS — K769 Liver disease, unspecified: Secondary | ICD-10-CM | POA: Diagnosis not present

## 2015-10-16 DIAGNOSIS — E440 Moderate protein-calorie malnutrition: Secondary | ICD-10-CM | POA: Diagnosis not present

## 2015-10-17 DIAGNOSIS — D509 Iron deficiency anemia, unspecified: Secondary | ICD-10-CM | POA: Diagnosis not present

## 2015-10-17 DIAGNOSIS — K769 Liver disease, unspecified: Secondary | ICD-10-CM | POA: Diagnosis not present

## 2015-10-17 DIAGNOSIS — E440 Moderate protein-calorie malnutrition: Secondary | ICD-10-CM | POA: Diagnosis not present

## 2015-10-17 DIAGNOSIS — D631 Anemia in chronic kidney disease: Secondary | ICD-10-CM | POA: Diagnosis not present

## 2015-10-17 DIAGNOSIS — Z79899 Other long term (current) drug therapy: Secondary | ICD-10-CM | POA: Diagnosis not present

## 2015-10-17 DIAGNOSIS — N186 End stage renal disease: Secondary | ICD-10-CM | POA: Diagnosis not present

## 2015-10-18 DIAGNOSIS — Z79899 Other long term (current) drug therapy: Secondary | ICD-10-CM | POA: Diagnosis not present

## 2015-10-18 DIAGNOSIS — K769 Liver disease, unspecified: Secondary | ICD-10-CM | POA: Diagnosis not present

## 2015-10-18 DIAGNOSIS — D631 Anemia in chronic kidney disease: Secondary | ICD-10-CM | POA: Diagnosis not present

## 2015-10-18 DIAGNOSIS — N186 End stage renal disease: Secondary | ICD-10-CM | POA: Diagnosis not present

## 2015-10-18 DIAGNOSIS — D509 Iron deficiency anemia, unspecified: Secondary | ICD-10-CM | POA: Diagnosis not present

## 2015-10-18 DIAGNOSIS — E440 Moderate protein-calorie malnutrition: Secondary | ICD-10-CM | POA: Diagnosis not present

## 2015-10-19 DIAGNOSIS — D509 Iron deficiency anemia, unspecified: Secondary | ICD-10-CM | POA: Diagnosis not present

## 2015-10-19 DIAGNOSIS — N186 End stage renal disease: Secondary | ICD-10-CM | POA: Diagnosis not present

## 2015-10-19 DIAGNOSIS — Z79899 Other long term (current) drug therapy: Secondary | ICD-10-CM | POA: Diagnosis not present

## 2015-10-19 DIAGNOSIS — K769 Liver disease, unspecified: Secondary | ICD-10-CM | POA: Diagnosis not present

## 2015-10-19 DIAGNOSIS — D631 Anemia in chronic kidney disease: Secondary | ICD-10-CM | POA: Diagnosis not present

## 2015-10-19 DIAGNOSIS — E440 Moderate protein-calorie malnutrition: Secondary | ICD-10-CM | POA: Diagnosis not present

## 2015-10-20 DIAGNOSIS — E440 Moderate protein-calorie malnutrition: Secondary | ICD-10-CM | POA: Diagnosis not present

## 2015-10-20 DIAGNOSIS — D509 Iron deficiency anemia, unspecified: Secondary | ICD-10-CM | POA: Diagnosis not present

## 2015-10-20 DIAGNOSIS — N186 End stage renal disease: Secondary | ICD-10-CM | POA: Diagnosis not present

## 2015-10-20 DIAGNOSIS — D631 Anemia in chronic kidney disease: Secondary | ICD-10-CM | POA: Diagnosis not present

## 2015-10-20 DIAGNOSIS — K769 Liver disease, unspecified: Secondary | ICD-10-CM | POA: Diagnosis not present

## 2015-10-20 DIAGNOSIS — Z79899 Other long term (current) drug therapy: Secondary | ICD-10-CM | POA: Diagnosis not present

## 2015-10-21 DIAGNOSIS — N186 End stage renal disease: Secondary | ICD-10-CM | POA: Diagnosis not present

## 2015-10-21 DIAGNOSIS — D631 Anemia in chronic kidney disease: Secondary | ICD-10-CM | POA: Diagnosis not present

## 2015-10-21 DIAGNOSIS — E440 Moderate protein-calorie malnutrition: Secondary | ICD-10-CM | POA: Diagnosis not present

## 2015-10-21 DIAGNOSIS — Z79899 Other long term (current) drug therapy: Secondary | ICD-10-CM | POA: Diagnosis not present

## 2015-10-21 DIAGNOSIS — K769 Liver disease, unspecified: Secondary | ICD-10-CM | POA: Diagnosis not present

## 2015-10-21 DIAGNOSIS — D509 Iron deficiency anemia, unspecified: Secondary | ICD-10-CM | POA: Diagnosis not present

## 2015-10-22 DIAGNOSIS — K769 Liver disease, unspecified: Secondary | ICD-10-CM | POA: Diagnosis not present

## 2015-10-22 DIAGNOSIS — Z79899 Other long term (current) drug therapy: Secondary | ICD-10-CM | POA: Diagnosis not present

## 2015-10-22 DIAGNOSIS — N186 End stage renal disease: Secondary | ICD-10-CM | POA: Diagnosis not present

## 2015-10-22 DIAGNOSIS — D509 Iron deficiency anemia, unspecified: Secondary | ICD-10-CM | POA: Diagnosis not present

## 2015-10-22 DIAGNOSIS — E440 Moderate protein-calorie malnutrition: Secondary | ICD-10-CM | POA: Diagnosis not present

## 2015-10-22 DIAGNOSIS — D631 Anemia in chronic kidney disease: Secondary | ICD-10-CM | POA: Diagnosis not present

## 2015-10-23 DIAGNOSIS — N186 End stage renal disease: Secondary | ICD-10-CM | POA: Diagnosis not present

## 2015-10-23 DIAGNOSIS — K769 Liver disease, unspecified: Secondary | ICD-10-CM | POA: Diagnosis not present

## 2015-10-23 DIAGNOSIS — D509 Iron deficiency anemia, unspecified: Secondary | ICD-10-CM | POA: Diagnosis not present

## 2015-10-23 DIAGNOSIS — Z79899 Other long term (current) drug therapy: Secondary | ICD-10-CM | POA: Diagnosis not present

## 2015-10-23 DIAGNOSIS — E440 Moderate protein-calorie malnutrition: Secondary | ICD-10-CM | POA: Diagnosis not present

## 2015-10-23 DIAGNOSIS — D631 Anemia in chronic kidney disease: Secondary | ICD-10-CM | POA: Diagnosis not present

## 2015-10-24 DIAGNOSIS — K769 Liver disease, unspecified: Secondary | ICD-10-CM | POA: Diagnosis not present

## 2015-10-24 DIAGNOSIS — D509 Iron deficiency anemia, unspecified: Secondary | ICD-10-CM | POA: Diagnosis not present

## 2015-10-24 DIAGNOSIS — D631 Anemia in chronic kidney disease: Secondary | ICD-10-CM | POA: Diagnosis not present

## 2015-10-24 DIAGNOSIS — E440 Moderate protein-calorie malnutrition: Secondary | ICD-10-CM | POA: Diagnosis not present

## 2015-10-24 DIAGNOSIS — Z79899 Other long term (current) drug therapy: Secondary | ICD-10-CM | POA: Diagnosis not present

## 2015-10-24 DIAGNOSIS — N186 End stage renal disease: Secondary | ICD-10-CM | POA: Diagnosis not present

## 2015-10-25 DIAGNOSIS — Z79899 Other long term (current) drug therapy: Secondary | ICD-10-CM | POA: Diagnosis not present

## 2015-10-25 DIAGNOSIS — K769 Liver disease, unspecified: Secondary | ICD-10-CM | POA: Diagnosis not present

## 2015-10-25 DIAGNOSIS — E440 Moderate protein-calorie malnutrition: Secondary | ICD-10-CM | POA: Diagnosis not present

## 2015-10-25 DIAGNOSIS — D509 Iron deficiency anemia, unspecified: Secondary | ICD-10-CM | POA: Diagnosis not present

## 2015-10-25 DIAGNOSIS — N186 End stage renal disease: Secondary | ICD-10-CM | POA: Diagnosis not present

## 2015-10-25 DIAGNOSIS — D631 Anemia in chronic kidney disease: Secondary | ICD-10-CM | POA: Diagnosis not present

## 2015-10-26 ENCOUNTER — Other Ambulatory Visit: Payer: Self-pay | Admitting: Nephrology

## 2015-10-26 ENCOUNTER — Ambulatory Visit
Admission: RE | Admit: 2015-10-26 | Discharge: 2015-10-26 | Disposition: A | Discharge status: Home / Self Care | Payer: BLUE CROSS/BLUE SHIELD | Source: Ambulatory Visit | Attending: Nephrology | Admitting: Nephrology

## 2015-10-26 DIAGNOSIS — K769 Liver disease, unspecified: Secondary | ICD-10-CM | POA: Diagnosis not present

## 2015-10-26 DIAGNOSIS — R52 Pain, unspecified: Secondary | ICD-10-CM

## 2015-10-26 DIAGNOSIS — E440 Moderate protein-calorie malnutrition: Secondary | ICD-10-CM | POA: Diagnosis not present

## 2015-10-26 DIAGNOSIS — Z79899 Other long term (current) drug therapy: Secondary | ICD-10-CM | POA: Diagnosis not present

## 2015-10-26 DIAGNOSIS — N186 End stage renal disease: Secondary | ICD-10-CM | POA: Diagnosis not present

## 2015-10-26 DIAGNOSIS — D631 Anemia in chronic kidney disease: Secondary | ICD-10-CM | POA: Diagnosis not present

## 2015-10-26 DIAGNOSIS — D509 Iron deficiency anemia, unspecified: Secondary | ICD-10-CM | POA: Diagnosis not present

## 2015-10-27 DIAGNOSIS — D509 Iron deficiency anemia, unspecified: Secondary | ICD-10-CM | POA: Diagnosis not present

## 2015-10-27 DIAGNOSIS — N186 End stage renal disease: Secondary | ICD-10-CM | POA: Diagnosis not present

## 2015-10-27 DIAGNOSIS — T85611A Breakdown (mechanical) of intraperitoneal dialysis catheter, initial encounter: Secondary | ICD-10-CM | POA: Insufficient documentation

## 2015-10-27 DIAGNOSIS — Z79899 Other long term (current) drug therapy: Secondary | ICD-10-CM | POA: Diagnosis not present

## 2015-10-27 DIAGNOSIS — D631 Anemia in chronic kidney disease: Secondary | ICD-10-CM | POA: Diagnosis not present

## 2015-10-27 DIAGNOSIS — K769 Liver disease, unspecified: Secondary | ICD-10-CM | POA: Diagnosis not present

## 2015-10-27 DIAGNOSIS — E440 Moderate protein-calorie malnutrition: Secondary | ICD-10-CM | POA: Diagnosis not present

## 2015-10-28 DIAGNOSIS — Z79899 Other long term (current) drug therapy: Secondary | ICD-10-CM | POA: Diagnosis not present

## 2015-10-28 DIAGNOSIS — K769 Liver disease, unspecified: Secondary | ICD-10-CM | POA: Diagnosis not present

## 2015-10-28 DIAGNOSIS — E440 Moderate protein-calorie malnutrition: Secondary | ICD-10-CM | POA: Diagnosis not present

## 2015-10-28 DIAGNOSIS — N186 End stage renal disease: Secondary | ICD-10-CM | POA: Diagnosis not present

## 2015-10-28 DIAGNOSIS — D509 Iron deficiency anemia, unspecified: Secondary | ICD-10-CM | POA: Diagnosis not present

## 2015-10-28 DIAGNOSIS — D631 Anemia in chronic kidney disease: Secondary | ICD-10-CM | POA: Diagnosis not present

## 2015-10-29 DIAGNOSIS — N186 End stage renal disease: Secondary | ICD-10-CM | POA: Diagnosis not present

## 2015-10-29 DIAGNOSIS — E440 Moderate protein-calorie malnutrition: Secondary | ICD-10-CM | POA: Diagnosis not present

## 2015-10-29 DIAGNOSIS — D631 Anemia in chronic kidney disease: Secondary | ICD-10-CM | POA: Diagnosis not present

## 2015-10-29 DIAGNOSIS — Z79899 Other long term (current) drug therapy: Secondary | ICD-10-CM | POA: Diagnosis not present

## 2015-10-29 DIAGNOSIS — K769 Liver disease, unspecified: Secondary | ICD-10-CM | POA: Diagnosis not present

## 2015-10-29 DIAGNOSIS — D509 Iron deficiency anemia, unspecified: Secondary | ICD-10-CM | POA: Diagnosis not present

## 2015-10-30 DIAGNOSIS — K769 Liver disease, unspecified: Secondary | ICD-10-CM | POA: Diagnosis not present

## 2015-10-30 DIAGNOSIS — N186 End stage renal disease: Secondary | ICD-10-CM | POA: Diagnosis not present

## 2015-10-30 DIAGNOSIS — D509 Iron deficiency anemia, unspecified: Secondary | ICD-10-CM | POA: Diagnosis not present

## 2015-10-30 DIAGNOSIS — Z79899 Other long term (current) drug therapy: Secondary | ICD-10-CM | POA: Diagnosis not present

## 2015-10-30 DIAGNOSIS — D631 Anemia in chronic kidney disease: Secondary | ICD-10-CM | POA: Diagnosis not present

## 2015-10-30 DIAGNOSIS — E440 Moderate protein-calorie malnutrition: Secondary | ICD-10-CM | POA: Diagnosis not present

## 2015-10-31 DIAGNOSIS — D509 Iron deficiency anemia, unspecified: Secondary | ICD-10-CM | POA: Diagnosis not present

## 2015-10-31 DIAGNOSIS — K769 Liver disease, unspecified: Secondary | ICD-10-CM | POA: Diagnosis not present

## 2015-10-31 DIAGNOSIS — E440 Moderate protein-calorie malnutrition: Secondary | ICD-10-CM | POA: Diagnosis not present

## 2015-10-31 DIAGNOSIS — N186 End stage renal disease: Secondary | ICD-10-CM | POA: Diagnosis not present

## 2015-10-31 DIAGNOSIS — D631 Anemia in chronic kidney disease: Secondary | ICD-10-CM | POA: Diagnosis not present

## 2015-10-31 DIAGNOSIS — Z79899 Other long term (current) drug therapy: Secondary | ICD-10-CM | POA: Diagnosis not present

## 2015-11-01 DIAGNOSIS — D631 Anemia in chronic kidney disease: Secondary | ICD-10-CM | POA: Diagnosis not present

## 2015-11-01 DIAGNOSIS — K769 Liver disease, unspecified: Secondary | ICD-10-CM | POA: Diagnosis not present

## 2015-11-01 DIAGNOSIS — Z79899 Other long term (current) drug therapy: Secondary | ICD-10-CM | POA: Diagnosis not present

## 2015-11-01 DIAGNOSIS — N186 End stage renal disease: Secondary | ICD-10-CM | POA: Diagnosis not present

## 2015-11-01 DIAGNOSIS — D509 Iron deficiency anemia, unspecified: Secondary | ICD-10-CM | POA: Diagnosis not present

## 2015-11-01 DIAGNOSIS — E440 Moderate protein-calorie malnutrition: Secondary | ICD-10-CM | POA: Diagnosis not present

## 2015-11-02 DIAGNOSIS — N186 End stage renal disease: Secondary | ICD-10-CM | POA: Diagnosis not present

## 2015-11-02 DIAGNOSIS — D509 Iron deficiency anemia, unspecified: Secondary | ICD-10-CM | POA: Diagnosis not present

## 2015-11-02 DIAGNOSIS — Z79899 Other long term (current) drug therapy: Secondary | ICD-10-CM | POA: Diagnosis not present

## 2015-11-02 DIAGNOSIS — D631 Anemia in chronic kidney disease: Secondary | ICD-10-CM | POA: Diagnosis not present

## 2015-11-02 DIAGNOSIS — E440 Moderate protein-calorie malnutrition: Secondary | ICD-10-CM | POA: Diagnosis not present

## 2015-11-02 DIAGNOSIS — K769 Liver disease, unspecified: Secondary | ICD-10-CM | POA: Diagnosis not present

## 2015-11-02 DIAGNOSIS — Z992 Dependence on renal dialysis: Secondary | ICD-10-CM | POA: Diagnosis not present

## 2015-11-02 DIAGNOSIS — N2889 Other specified disorders of kidney and ureter: Secondary | ICD-10-CM | POA: Diagnosis not present

## 2015-11-03 DIAGNOSIS — K769 Liver disease, unspecified: Secondary | ICD-10-CM | POA: Diagnosis not present

## 2015-11-03 DIAGNOSIS — E876 Hypokalemia: Secondary | ICD-10-CM | POA: Diagnosis not present

## 2015-11-03 DIAGNOSIS — D631 Anemia in chronic kidney disease: Secondary | ICD-10-CM | POA: Diagnosis not present

## 2015-11-03 DIAGNOSIS — N186 End stage renal disease: Secondary | ICD-10-CM | POA: Diagnosis not present

## 2015-11-03 DIAGNOSIS — E440 Moderate protein-calorie malnutrition: Secondary | ICD-10-CM | POA: Diagnosis not present

## 2015-11-03 DIAGNOSIS — D509 Iron deficiency anemia, unspecified: Secondary | ICD-10-CM | POA: Diagnosis not present

## 2015-11-03 DIAGNOSIS — R8299 Other abnormal findings in urine: Secondary | ICD-10-CM | POA: Diagnosis not present

## 2015-11-03 DIAGNOSIS — Z79899 Other long term (current) drug therapy: Secondary | ICD-10-CM | POA: Diagnosis not present

## 2015-11-04 DIAGNOSIS — E876 Hypokalemia: Secondary | ICD-10-CM | POA: Diagnosis not present

## 2015-11-04 DIAGNOSIS — K769 Liver disease, unspecified: Secondary | ICD-10-CM | POA: Diagnosis not present

## 2015-11-04 DIAGNOSIS — E440 Moderate protein-calorie malnutrition: Secondary | ICD-10-CM | POA: Diagnosis not present

## 2015-11-04 DIAGNOSIS — D631 Anemia in chronic kidney disease: Secondary | ICD-10-CM | POA: Diagnosis not present

## 2015-11-04 DIAGNOSIS — N186 End stage renal disease: Secondary | ICD-10-CM | POA: Diagnosis not present

## 2015-11-04 DIAGNOSIS — Z79899 Other long term (current) drug therapy: Secondary | ICD-10-CM | POA: Diagnosis not present

## 2015-11-05 DIAGNOSIS — K769 Liver disease, unspecified: Secondary | ICD-10-CM | POA: Diagnosis not present

## 2015-11-05 DIAGNOSIS — N186 End stage renal disease: Secondary | ICD-10-CM | POA: Diagnosis not present

## 2015-11-05 DIAGNOSIS — E440 Moderate protein-calorie malnutrition: Secondary | ICD-10-CM | POA: Diagnosis not present

## 2015-11-05 DIAGNOSIS — Z79899 Other long term (current) drug therapy: Secondary | ICD-10-CM | POA: Diagnosis not present

## 2015-11-05 DIAGNOSIS — E876 Hypokalemia: Secondary | ICD-10-CM | POA: Diagnosis not present

## 2015-11-05 DIAGNOSIS — D631 Anemia in chronic kidney disease: Secondary | ICD-10-CM | POA: Diagnosis not present

## 2015-11-06 DIAGNOSIS — K769 Liver disease, unspecified: Secondary | ICD-10-CM | POA: Diagnosis not present

## 2015-11-06 DIAGNOSIS — D631 Anemia in chronic kidney disease: Secondary | ICD-10-CM | POA: Diagnosis not present

## 2015-11-06 DIAGNOSIS — E876 Hypokalemia: Secondary | ICD-10-CM | POA: Diagnosis not present

## 2015-11-06 DIAGNOSIS — E440 Moderate protein-calorie malnutrition: Secondary | ICD-10-CM | POA: Diagnosis not present

## 2015-11-06 DIAGNOSIS — Z79899 Other long term (current) drug therapy: Secondary | ICD-10-CM | POA: Diagnosis not present

## 2015-11-06 DIAGNOSIS — N186 End stage renal disease: Secondary | ICD-10-CM | POA: Diagnosis not present

## 2015-11-07 DIAGNOSIS — N186 End stage renal disease: Secondary | ICD-10-CM | POA: Diagnosis not present

## 2015-11-07 DIAGNOSIS — E440 Moderate protein-calorie malnutrition: Secondary | ICD-10-CM | POA: Diagnosis not present

## 2015-11-07 DIAGNOSIS — Z79899 Other long term (current) drug therapy: Secondary | ICD-10-CM | POA: Diagnosis not present

## 2015-11-07 DIAGNOSIS — K769 Liver disease, unspecified: Secondary | ICD-10-CM | POA: Diagnosis not present

## 2015-11-07 DIAGNOSIS — D631 Anemia in chronic kidney disease: Secondary | ICD-10-CM | POA: Diagnosis not present

## 2015-11-07 DIAGNOSIS — E876 Hypokalemia: Secondary | ICD-10-CM | POA: Diagnosis not present

## 2015-11-08 DIAGNOSIS — N186 End stage renal disease: Secondary | ICD-10-CM | POA: Diagnosis not present

## 2015-11-08 DIAGNOSIS — Z79899 Other long term (current) drug therapy: Secondary | ICD-10-CM | POA: Diagnosis not present

## 2015-11-08 DIAGNOSIS — K769 Liver disease, unspecified: Secondary | ICD-10-CM | POA: Diagnosis not present

## 2015-11-08 DIAGNOSIS — E440 Moderate protein-calorie malnutrition: Secondary | ICD-10-CM | POA: Diagnosis not present

## 2015-11-08 DIAGNOSIS — D631 Anemia in chronic kidney disease: Secondary | ICD-10-CM | POA: Diagnosis not present

## 2015-11-08 DIAGNOSIS — E876 Hypokalemia: Secondary | ICD-10-CM | POA: Diagnosis not present

## 2015-11-09 DIAGNOSIS — N186 End stage renal disease: Secondary | ICD-10-CM | POA: Diagnosis not present

## 2015-11-09 DIAGNOSIS — E876 Hypokalemia: Secondary | ICD-10-CM | POA: Diagnosis not present

## 2015-11-09 DIAGNOSIS — K769 Liver disease, unspecified: Secondary | ICD-10-CM | POA: Diagnosis not present

## 2015-11-09 DIAGNOSIS — E440 Moderate protein-calorie malnutrition: Secondary | ICD-10-CM | POA: Diagnosis not present

## 2015-11-09 DIAGNOSIS — D631 Anemia in chronic kidney disease: Secondary | ICD-10-CM | POA: Diagnosis not present

## 2015-11-09 DIAGNOSIS — Z79899 Other long term (current) drug therapy: Secondary | ICD-10-CM | POA: Diagnosis not present

## 2015-11-10 DIAGNOSIS — E440 Moderate protein-calorie malnutrition: Secondary | ICD-10-CM | POA: Diagnosis not present

## 2015-11-10 DIAGNOSIS — E876 Hypokalemia: Secondary | ICD-10-CM | POA: Diagnosis not present

## 2015-11-10 DIAGNOSIS — N186 End stage renal disease: Secondary | ICD-10-CM | POA: Diagnosis not present

## 2015-11-10 DIAGNOSIS — Z79899 Other long term (current) drug therapy: Secondary | ICD-10-CM | POA: Diagnosis not present

## 2015-11-10 DIAGNOSIS — R9439 Abnormal result of other cardiovascular function study: Secondary | ICD-10-CM | POA: Diagnosis not present

## 2015-11-10 DIAGNOSIS — D631 Anemia in chronic kidney disease: Secondary | ICD-10-CM | POA: Diagnosis not present

## 2015-11-10 DIAGNOSIS — K769 Liver disease, unspecified: Secondary | ICD-10-CM | POA: Diagnosis not present

## 2015-11-11 DIAGNOSIS — N186 End stage renal disease: Secondary | ICD-10-CM | POA: Diagnosis not present

## 2015-11-11 DIAGNOSIS — K769 Liver disease, unspecified: Secondary | ICD-10-CM | POA: Diagnosis not present

## 2015-11-11 DIAGNOSIS — E876 Hypokalemia: Secondary | ICD-10-CM | POA: Diagnosis not present

## 2015-11-11 DIAGNOSIS — Z79899 Other long term (current) drug therapy: Secondary | ICD-10-CM | POA: Diagnosis not present

## 2015-11-11 DIAGNOSIS — E440 Moderate protein-calorie malnutrition: Secondary | ICD-10-CM | POA: Diagnosis not present

## 2015-11-11 DIAGNOSIS — D631 Anemia in chronic kidney disease: Secondary | ICD-10-CM | POA: Diagnosis not present

## 2015-11-12 DIAGNOSIS — Z79899 Other long term (current) drug therapy: Secondary | ICD-10-CM | POA: Diagnosis not present

## 2015-11-12 DIAGNOSIS — E876 Hypokalemia: Secondary | ICD-10-CM | POA: Diagnosis not present

## 2015-11-12 DIAGNOSIS — K769 Liver disease, unspecified: Secondary | ICD-10-CM | POA: Diagnosis not present

## 2015-11-12 DIAGNOSIS — N186 End stage renal disease: Secondary | ICD-10-CM | POA: Diagnosis not present

## 2015-11-12 DIAGNOSIS — D631 Anemia in chronic kidney disease: Secondary | ICD-10-CM | POA: Diagnosis not present

## 2015-11-12 DIAGNOSIS — E440 Moderate protein-calorie malnutrition: Secondary | ICD-10-CM | POA: Diagnosis not present

## 2015-11-13 DIAGNOSIS — E876 Hypokalemia: Secondary | ICD-10-CM | POA: Diagnosis not present

## 2015-11-13 DIAGNOSIS — K769 Liver disease, unspecified: Secondary | ICD-10-CM | POA: Diagnosis not present

## 2015-11-13 DIAGNOSIS — D631 Anemia in chronic kidney disease: Secondary | ICD-10-CM | POA: Diagnosis not present

## 2015-11-13 DIAGNOSIS — E440 Moderate protein-calorie malnutrition: Secondary | ICD-10-CM | POA: Diagnosis not present

## 2015-11-13 DIAGNOSIS — Z79899 Other long term (current) drug therapy: Secondary | ICD-10-CM | POA: Diagnosis not present

## 2015-11-13 DIAGNOSIS — N186 End stage renal disease: Secondary | ICD-10-CM | POA: Diagnosis not present

## 2015-11-14 DIAGNOSIS — D631 Anemia in chronic kidney disease: Secondary | ICD-10-CM | POA: Diagnosis not present

## 2015-11-14 DIAGNOSIS — E876 Hypokalemia: Secondary | ICD-10-CM | POA: Diagnosis not present

## 2015-11-14 DIAGNOSIS — K769 Liver disease, unspecified: Secondary | ICD-10-CM | POA: Diagnosis not present

## 2015-11-14 DIAGNOSIS — E440 Moderate protein-calorie malnutrition: Secondary | ICD-10-CM | POA: Diagnosis not present

## 2015-11-14 DIAGNOSIS — N186 End stage renal disease: Secondary | ICD-10-CM | POA: Diagnosis not present

## 2015-11-14 DIAGNOSIS — Z79899 Other long term (current) drug therapy: Secondary | ICD-10-CM | POA: Diagnosis not present

## 2015-11-15 DIAGNOSIS — E440 Moderate protein-calorie malnutrition: Secondary | ICD-10-CM | POA: Diagnosis not present

## 2015-11-15 DIAGNOSIS — E876 Hypokalemia: Secondary | ICD-10-CM | POA: Diagnosis not present

## 2015-11-15 DIAGNOSIS — N186 End stage renal disease: Secondary | ICD-10-CM | POA: Diagnosis not present

## 2015-11-15 DIAGNOSIS — K769 Liver disease, unspecified: Secondary | ICD-10-CM | POA: Diagnosis not present

## 2015-11-15 DIAGNOSIS — D631 Anemia in chronic kidney disease: Secondary | ICD-10-CM | POA: Diagnosis not present

## 2015-11-15 DIAGNOSIS — Z79899 Other long term (current) drug therapy: Secondary | ICD-10-CM | POA: Diagnosis not present

## 2015-11-16 DIAGNOSIS — E876 Hypokalemia: Secondary | ICD-10-CM | POA: Diagnosis not present

## 2015-11-16 DIAGNOSIS — E440 Moderate protein-calorie malnutrition: Secondary | ICD-10-CM | POA: Diagnosis not present

## 2015-11-16 DIAGNOSIS — N186 End stage renal disease: Secondary | ICD-10-CM | POA: Diagnosis not present

## 2015-11-16 DIAGNOSIS — K769 Liver disease, unspecified: Secondary | ICD-10-CM | POA: Diagnosis not present

## 2015-11-16 DIAGNOSIS — D631 Anemia in chronic kidney disease: Secondary | ICD-10-CM | POA: Diagnosis not present

## 2015-11-16 DIAGNOSIS — Z79899 Other long term (current) drug therapy: Secondary | ICD-10-CM | POA: Diagnosis not present

## 2015-11-17 DIAGNOSIS — Z79899 Other long term (current) drug therapy: Secondary | ICD-10-CM | POA: Diagnosis not present

## 2015-11-17 DIAGNOSIS — I2510 Atherosclerotic heart disease of native coronary artery without angina pectoris: Secondary | ICD-10-CM | POA: Diagnosis not present

## 2015-11-17 DIAGNOSIS — D631 Anemia in chronic kidney disease: Secondary | ICD-10-CM | POA: Diagnosis not present

## 2015-11-17 DIAGNOSIS — E440 Moderate protein-calorie malnutrition: Secondary | ICD-10-CM | POA: Diagnosis not present

## 2015-11-17 DIAGNOSIS — K769 Liver disease, unspecified: Secondary | ICD-10-CM | POA: Diagnosis not present

## 2015-11-17 DIAGNOSIS — N186 End stage renal disease: Secondary | ICD-10-CM | POA: Diagnosis not present

## 2015-11-17 DIAGNOSIS — R9439 Abnormal result of other cardiovascular function study: Secondary | ICD-10-CM | POA: Diagnosis not present

## 2015-11-17 DIAGNOSIS — I517 Cardiomegaly: Secondary | ICD-10-CM | POA: Diagnosis not present

## 2015-11-17 DIAGNOSIS — R9430 Abnormal result of cardiovascular function study, unspecified: Secondary | ICD-10-CM | POA: Diagnosis not present

## 2015-11-17 DIAGNOSIS — E876 Hypokalemia: Secondary | ICD-10-CM | POA: Diagnosis not present

## 2015-11-17 DIAGNOSIS — Z01810 Encounter for preprocedural cardiovascular examination: Secondary | ICD-10-CM | POA: Diagnosis not present

## 2015-11-17 DIAGNOSIS — Z7682 Awaiting organ transplant status: Secondary | ICD-10-CM | POA: Diagnosis not present

## 2015-11-18 DIAGNOSIS — N186 End stage renal disease: Secondary | ICD-10-CM | POA: Diagnosis not present

## 2015-11-18 DIAGNOSIS — E440 Moderate protein-calorie malnutrition: Secondary | ICD-10-CM | POA: Diagnosis not present

## 2015-11-18 DIAGNOSIS — D631 Anemia in chronic kidney disease: Secondary | ICD-10-CM | POA: Diagnosis not present

## 2015-11-18 DIAGNOSIS — E876 Hypokalemia: Secondary | ICD-10-CM | POA: Diagnosis not present

## 2015-11-18 DIAGNOSIS — K769 Liver disease, unspecified: Secondary | ICD-10-CM | POA: Diagnosis not present

## 2015-11-18 DIAGNOSIS — Z79899 Other long term (current) drug therapy: Secondary | ICD-10-CM | POA: Diagnosis not present

## 2015-11-19 DIAGNOSIS — E440 Moderate protein-calorie malnutrition: Secondary | ICD-10-CM | POA: Diagnosis not present

## 2015-11-19 DIAGNOSIS — Z79899 Other long term (current) drug therapy: Secondary | ICD-10-CM | POA: Diagnosis not present

## 2015-11-19 DIAGNOSIS — K769 Liver disease, unspecified: Secondary | ICD-10-CM | POA: Diagnosis not present

## 2015-11-19 DIAGNOSIS — N186 End stage renal disease: Secondary | ICD-10-CM | POA: Diagnosis not present

## 2015-11-19 DIAGNOSIS — D631 Anemia in chronic kidney disease: Secondary | ICD-10-CM | POA: Diagnosis not present

## 2015-11-19 DIAGNOSIS — E876 Hypokalemia: Secondary | ICD-10-CM | POA: Diagnosis not present

## 2015-11-20 DIAGNOSIS — N186 End stage renal disease: Secondary | ICD-10-CM | POA: Diagnosis not present

## 2015-11-20 DIAGNOSIS — Z79899 Other long term (current) drug therapy: Secondary | ICD-10-CM | POA: Diagnosis not present

## 2015-11-20 DIAGNOSIS — E876 Hypokalemia: Secondary | ICD-10-CM | POA: Diagnosis not present

## 2015-11-20 DIAGNOSIS — K769 Liver disease, unspecified: Secondary | ICD-10-CM | POA: Diagnosis not present

## 2015-11-20 DIAGNOSIS — D631 Anemia in chronic kidney disease: Secondary | ICD-10-CM | POA: Diagnosis not present

## 2015-11-20 DIAGNOSIS — E440 Moderate protein-calorie malnutrition: Secondary | ICD-10-CM | POA: Diagnosis not present

## 2015-11-21 DIAGNOSIS — E440 Moderate protein-calorie malnutrition: Secondary | ICD-10-CM | POA: Diagnosis not present

## 2015-11-21 DIAGNOSIS — D631 Anemia in chronic kidney disease: Secondary | ICD-10-CM | POA: Diagnosis not present

## 2015-11-21 DIAGNOSIS — K769 Liver disease, unspecified: Secondary | ICD-10-CM | POA: Diagnosis not present

## 2015-11-21 DIAGNOSIS — E876 Hypokalemia: Secondary | ICD-10-CM | POA: Diagnosis not present

## 2015-11-21 DIAGNOSIS — N186 End stage renal disease: Secondary | ICD-10-CM | POA: Diagnosis not present

## 2015-11-21 DIAGNOSIS — Z79899 Other long term (current) drug therapy: Secondary | ICD-10-CM | POA: Diagnosis not present

## 2015-11-22 DIAGNOSIS — Z79899 Other long term (current) drug therapy: Secondary | ICD-10-CM | POA: Diagnosis not present

## 2015-11-22 DIAGNOSIS — E876 Hypokalemia: Secondary | ICD-10-CM | POA: Diagnosis not present

## 2015-11-22 DIAGNOSIS — K769 Liver disease, unspecified: Secondary | ICD-10-CM | POA: Diagnosis not present

## 2015-11-22 DIAGNOSIS — N186 End stage renal disease: Secondary | ICD-10-CM | POA: Diagnosis not present

## 2015-11-22 DIAGNOSIS — D631 Anemia in chronic kidney disease: Secondary | ICD-10-CM | POA: Diagnosis not present

## 2015-11-22 DIAGNOSIS — E440 Moderate protein-calorie malnutrition: Secondary | ICD-10-CM | POA: Diagnosis not present

## 2015-11-23 DIAGNOSIS — Z79899 Other long term (current) drug therapy: Secondary | ICD-10-CM | POA: Diagnosis not present

## 2015-11-23 DIAGNOSIS — E876 Hypokalemia: Secondary | ICD-10-CM | POA: Diagnosis not present

## 2015-11-23 DIAGNOSIS — N186 End stage renal disease: Secondary | ICD-10-CM | POA: Diagnosis not present

## 2015-11-23 DIAGNOSIS — E440 Moderate protein-calorie malnutrition: Secondary | ICD-10-CM | POA: Diagnosis not present

## 2015-11-23 DIAGNOSIS — D631 Anemia in chronic kidney disease: Secondary | ICD-10-CM | POA: Diagnosis not present

## 2015-11-23 DIAGNOSIS — K769 Liver disease, unspecified: Secondary | ICD-10-CM | POA: Diagnosis not present

## 2015-11-24 DIAGNOSIS — N186 End stage renal disease: Secondary | ICD-10-CM | POA: Diagnosis not present

## 2015-11-24 DIAGNOSIS — D631 Anemia in chronic kidney disease: Secondary | ICD-10-CM | POA: Diagnosis not present

## 2015-11-24 DIAGNOSIS — Z79899 Other long term (current) drug therapy: Secondary | ICD-10-CM | POA: Diagnosis not present

## 2015-11-24 DIAGNOSIS — E440 Moderate protein-calorie malnutrition: Secondary | ICD-10-CM | POA: Diagnosis not present

## 2015-11-24 DIAGNOSIS — K769 Liver disease, unspecified: Secondary | ICD-10-CM | POA: Diagnosis not present

## 2015-11-24 DIAGNOSIS — E876 Hypokalemia: Secondary | ICD-10-CM | POA: Diagnosis not present

## 2015-11-25 DIAGNOSIS — E876 Hypokalemia: Secondary | ICD-10-CM | POA: Diagnosis not present

## 2015-11-25 DIAGNOSIS — K769 Liver disease, unspecified: Secondary | ICD-10-CM | POA: Diagnosis not present

## 2015-11-25 DIAGNOSIS — Z79899 Other long term (current) drug therapy: Secondary | ICD-10-CM | POA: Diagnosis not present

## 2015-11-25 DIAGNOSIS — D631 Anemia in chronic kidney disease: Secondary | ICD-10-CM | POA: Diagnosis not present

## 2015-11-25 DIAGNOSIS — N186 End stage renal disease: Secondary | ICD-10-CM | POA: Diagnosis not present

## 2015-11-25 DIAGNOSIS — E440 Moderate protein-calorie malnutrition: Secondary | ICD-10-CM | POA: Diagnosis not present

## 2015-11-26 DIAGNOSIS — E440 Moderate protein-calorie malnutrition: Secondary | ICD-10-CM | POA: Diagnosis not present

## 2015-11-26 DIAGNOSIS — K769 Liver disease, unspecified: Secondary | ICD-10-CM | POA: Diagnosis not present

## 2015-11-26 DIAGNOSIS — Z79899 Other long term (current) drug therapy: Secondary | ICD-10-CM | POA: Diagnosis not present

## 2015-11-26 DIAGNOSIS — N186 End stage renal disease: Secondary | ICD-10-CM | POA: Diagnosis not present

## 2015-11-26 DIAGNOSIS — D631 Anemia in chronic kidney disease: Secondary | ICD-10-CM | POA: Diagnosis not present

## 2015-11-26 DIAGNOSIS — E876 Hypokalemia: Secondary | ICD-10-CM | POA: Diagnosis not present

## 2015-11-27 DIAGNOSIS — E440 Moderate protein-calorie malnutrition: Secondary | ICD-10-CM | POA: Diagnosis not present

## 2015-11-27 DIAGNOSIS — N186 End stage renal disease: Secondary | ICD-10-CM | POA: Diagnosis not present

## 2015-11-27 DIAGNOSIS — Z79899 Other long term (current) drug therapy: Secondary | ICD-10-CM | POA: Diagnosis not present

## 2015-11-27 DIAGNOSIS — E876 Hypokalemia: Secondary | ICD-10-CM | POA: Diagnosis not present

## 2015-11-27 DIAGNOSIS — K769 Liver disease, unspecified: Secondary | ICD-10-CM | POA: Diagnosis not present

## 2015-11-27 DIAGNOSIS — D631 Anemia in chronic kidney disease: Secondary | ICD-10-CM | POA: Diagnosis not present

## 2015-11-28 DIAGNOSIS — D631 Anemia in chronic kidney disease: Secondary | ICD-10-CM | POA: Diagnosis not present

## 2015-11-28 DIAGNOSIS — N186 End stage renal disease: Secondary | ICD-10-CM | POA: Diagnosis not present

## 2015-11-28 DIAGNOSIS — E440 Moderate protein-calorie malnutrition: Secondary | ICD-10-CM | POA: Diagnosis not present

## 2015-11-28 DIAGNOSIS — E876 Hypokalemia: Secondary | ICD-10-CM | POA: Diagnosis not present

## 2015-11-28 DIAGNOSIS — K769 Liver disease, unspecified: Secondary | ICD-10-CM | POA: Diagnosis not present

## 2015-11-28 DIAGNOSIS — Z79899 Other long term (current) drug therapy: Secondary | ICD-10-CM | POA: Diagnosis not present

## 2015-11-29 DIAGNOSIS — E876 Hypokalemia: Secondary | ICD-10-CM | POA: Diagnosis not present

## 2015-11-29 DIAGNOSIS — K769 Liver disease, unspecified: Secondary | ICD-10-CM | POA: Diagnosis not present

## 2015-11-29 DIAGNOSIS — Z79899 Other long term (current) drug therapy: Secondary | ICD-10-CM | POA: Diagnosis not present

## 2015-11-29 DIAGNOSIS — D631 Anemia in chronic kidney disease: Secondary | ICD-10-CM | POA: Diagnosis not present

## 2015-11-29 DIAGNOSIS — E440 Moderate protein-calorie malnutrition: Secondary | ICD-10-CM | POA: Diagnosis not present

## 2015-11-29 DIAGNOSIS — N186 End stage renal disease: Secondary | ICD-10-CM | POA: Diagnosis not present

## 2015-11-30 DIAGNOSIS — K769 Liver disease, unspecified: Secondary | ICD-10-CM | POA: Diagnosis not present

## 2015-11-30 DIAGNOSIS — N186 End stage renal disease: Secondary | ICD-10-CM | POA: Diagnosis not present

## 2015-11-30 DIAGNOSIS — Z79899 Other long term (current) drug therapy: Secondary | ICD-10-CM | POA: Diagnosis not present

## 2015-11-30 DIAGNOSIS — E440 Moderate protein-calorie malnutrition: Secondary | ICD-10-CM | POA: Diagnosis not present

## 2015-11-30 DIAGNOSIS — D631 Anemia in chronic kidney disease: Secondary | ICD-10-CM | POA: Diagnosis not present

## 2015-11-30 DIAGNOSIS — E876 Hypokalemia: Secondary | ICD-10-CM | POA: Diagnosis not present

## 2015-12-01 DIAGNOSIS — E876 Hypokalemia: Secondary | ICD-10-CM | POA: Diagnosis not present

## 2015-12-01 DIAGNOSIS — K769 Liver disease, unspecified: Secondary | ICD-10-CM | POA: Diagnosis not present

## 2015-12-01 DIAGNOSIS — N186 End stage renal disease: Secondary | ICD-10-CM | POA: Diagnosis not present

## 2015-12-01 DIAGNOSIS — Z79899 Other long term (current) drug therapy: Secondary | ICD-10-CM | POA: Diagnosis not present

## 2015-12-01 DIAGNOSIS — E440 Moderate protein-calorie malnutrition: Secondary | ICD-10-CM | POA: Diagnosis not present

## 2015-12-01 DIAGNOSIS — D631 Anemia in chronic kidney disease: Secondary | ICD-10-CM | POA: Diagnosis not present

## 2015-12-02 DIAGNOSIS — Z79899 Other long term (current) drug therapy: Secondary | ICD-10-CM | POA: Diagnosis not present

## 2015-12-02 DIAGNOSIS — N200 Calculus of kidney: Secondary | ICD-10-CM | POA: Diagnosis not present

## 2015-12-02 DIAGNOSIS — N186 End stage renal disease: Secondary | ICD-10-CM | POA: Diagnosis not present

## 2015-12-02 DIAGNOSIS — N281 Cyst of kidney, acquired: Secondary | ICD-10-CM | POA: Diagnosis not present

## 2015-12-02 DIAGNOSIS — E876 Hypokalemia: Secondary | ICD-10-CM | POA: Diagnosis not present

## 2015-12-02 DIAGNOSIS — K769 Liver disease, unspecified: Secondary | ICD-10-CM | POA: Diagnosis not present

## 2015-12-02 DIAGNOSIS — D631 Anemia in chronic kidney disease: Secondary | ICD-10-CM | POA: Diagnosis not present

## 2015-12-02 DIAGNOSIS — E440 Moderate protein-calorie malnutrition: Secondary | ICD-10-CM | POA: Diagnosis not present

## 2015-12-03 DIAGNOSIS — Z992 Dependence on renal dialysis: Secondary | ICD-10-CM | POA: Diagnosis not present

## 2015-12-03 DIAGNOSIS — N2889 Other specified disorders of kidney and ureter: Secondary | ICD-10-CM | POA: Diagnosis not present

## 2015-12-03 DIAGNOSIS — N186 End stage renal disease: Secondary | ICD-10-CM | POA: Diagnosis not present

## 2015-12-03 DIAGNOSIS — D631 Anemia in chronic kidney disease: Secondary | ICD-10-CM | POA: Diagnosis not present

## 2015-12-03 DIAGNOSIS — Z79899 Other long term (current) drug therapy: Secondary | ICD-10-CM | POA: Diagnosis not present

## 2015-12-03 DIAGNOSIS — E440 Moderate protein-calorie malnutrition: Secondary | ICD-10-CM | POA: Diagnosis not present

## 2015-12-03 DIAGNOSIS — K769 Liver disease, unspecified: Secondary | ICD-10-CM | POA: Diagnosis not present

## 2015-12-03 DIAGNOSIS — E876 Hypokalemia: Secondary | ICD-10-CM | POA: Diagnosis not present

## 2015-12-04 DIAGNOSIS — K769 Liver disease, unspecified: Secondary | ICD-10-CM | POA: Diagnosis not present

## 2015-12-04 DIAGNOSIS — D631 Anemia in chronic kidney disease: Secondary | ICD-10-CM | POA: Diagnosis not present

## 2015-12-04 DIAGNOSIS — Z23 Encounter for immunization: Secondary | ICD-10-CM | POA: Diagnosis not present

## 2015-12-04 DIAGNOSIS — N2581 Secondary hyperparathyroidism of renal origin: Secondary | ICD-10-CM | POA: Diagnosis not present

## 2015-12-04 DIAGNOSIS — E440 Moderate protein-calorie malnutrition: Secondary | ICD-10-CM | POA: Diagnosis not present

## 2015-12-04 DIAGNOSIS — N186 End stage renal disease: Secondary | ICD-10-CM | POA: Diagnosis not present

## 2015-12-05 DIAGNOSIS — N186 End stage renal disease: Secondary | ICD-10-CM | POA: Diagnosis not present

## 2015-12-05 DIAGNOSIS — N2581 Secondary hyperparathyroidism of renal origin: Secondary | ICD-10-CM | POA: Diagnosis not present

## 2015-12-05 DIAGNOSIS — D631 Anemia in chronic kidney disease: Secondary | ICD-10-CM | POA: Diagnosis not present

## 2015-12-05 DIAGNOSIS — E440 Moderate protein-calorie malnutrition: Secondary | ICD-10-CM | POA: Diagnosis not present

## 2015-12-05 DIAGNOSIS — Z23 Encounter for immunization: Secondary | ICD-10-CM | POA: Diagnosis not present

## 2015-12-05 DIAGNOSIS — K769 Liver disease, unspecified: Secondary | ICD-10-CM | POA: Diagnosis not present

## 2015-12-06 DIAGNOSIS — N186 End stage renal disease: Secondary | ICD-10-CM | POA: Diagnosis not present

## 2015-12-06 DIAGNOSIS — N2581 Secondary hyperparathyroidism of renal origin: Secondary | ICD-10-CM | POA: Diagnosis not present

## 2015-12-06 DIAGNOSIS — K769 Liver disease, unspecified: Secondary | ICD-10-CM | POA: Diagnosis not present

## 2015-12-06 DIAGNOSIS — Z23 Encounter for immunization: Secondary | ICD-10-CM | POA: Diagnosis not present

## 2015-12-06 DIAGNOSIS — E440 Moderate protein-calorie malnutrition: Secondary | ICD-10-CM | POA: Diagnosis not present

## 2015-12-06 DIAGNOSIS — D631 Anemia in chronic kidney disease: Secondary | ICD-10-CM | POA: Diagnosis not present

## 2015-12-07 DIAGNOSIS — N186 End stage renal disease: Secondary | ICD-10-CM | POA: Diagnosis not present

## 2015-12-07 DIAGNOSIS — E440 Moderate protein-calorie malnutrition: Secondary | ICD-10-CM | POA: Diagnosis not present

## 2015-12-07 DIAGNOSIS — D631 Anemia in chronic kidney disease: Secondary | ICD-10-CM | POA: Diagnosis not present

## 2015-12-07 DIAGNOSIS — Z23 Encounter for immunization: Secondary | ICD-10-CM | POA: Diagnosis not present

## 2015-12-07 DIAGNOSIS — K769 Liver disease, unspecified: Secondary | ICD-10-CM | POA: Diagnosis not present

## 2015-12-07 DIAGNOSIS — N2581 Secondary hyperparathyroidism of renal origin: Secondary | ICD-10-CM | POA: Diagnosis not present

## 2015-12-08 DIAGNOSIS — N186 End stage renal disease: Secondary | ICD-10-CM | POA: Diagnosis not present

## 2015-12-08 DIAGNOSIS — K769 Liver disease, unspecified: Secondary | ICD-10-CM | POA: Diagnosis not present

## 2015-12-08 DIAGNOSIS — D631 Anemia in chronic kidney disease: Secondary | ICD-10-CM | POA: Diagnosis not present

## 2015-12-08 DIAGNOSIS — Z23 Encounter for immunization: Secondary | ICD-10-CM | POA: Diagnosis not present

## 2015-12-08 DIAGNOSIS — N2581 Secondary hyperparathyroidism of renal origin: Secondary | ICD-10-CM | POA: Diagnosis not present

## 2015-12-08 DIAGNOSIS — E440 Moderate protein-calorie malnutrition: Secondary | ICD-10-CM | POA: Diagnosis not present

## 2015-12-09 DIAGNOSIS — D631 Anemia in chronic kidney disease: Secondary | ICD-10-CM | POA: Diagnosis not present

## 2015-12-09 DIAGNOSIS — Z23 Encounter for immunization: Secondary | ICD-10-CM | POA: Diagnosis not present

## 2015-12-09 DIAGNOSIS — N186 End stage renal disease: Secondary | ICD-10-CM | POA: Diagnosis not present

## 2015-12-09 DIAGNOSIS — E440 Moderate protein-calorie malnutrition: Secondary | ICD-10-CM | POA: Diagnosis not present

## 2015-12-09 DIAGNOSIS — N2581 Secondary hyperparathyroidism of renal origin: Secondary | ICD-10-CM | POA: Diagnosis not present

## 2015-12-09 DIAGNOSIS — K769 Liver disease, unspecified: Secondary | ICD-10-CM | POA: Diagnosis not present

## 2015-12-10 DIAGNOSIS — R8299 Other abnormal findings in urine: Secondary | ICD-10-CM | POA: Diagnosis not present

## 2015-12-10 DIAGNOSIS — E440 Moderate protein-calorie malnutrition: Secondary | ICD-10-CM | POA: Diagnosis not present

## 2015-12-10 DIAGNOSIS — D631 Anemia in chronic kidney disease: Secondary | ICD-10-CM | POA: Diagnosis not present

## 2015-12-10 DIAGNOSIS — K769 Liver disease, unspecified: Secondary | ICD-10-CM | POA: Diagnosis not present

## 2015-12-10 DIAGNOSIS — Z23 Encounter for immunization: Secondary | ICD-10-CM | POA: Diagnosis not present

## 2015-12-10 DIAGNOSIS — N186 End stage renal disease: Secondary | ICD-10-CM | POA: Diagnosis not present

## 2015-12-10 DIAGNOSIS — E784 Other hyperlipidemia: Secondary | ICD-10-CM | POA: Diagnosis not present

## 2015-12-10 DIAGNOSIS — N2581 Secondary hyperparathyroidism of renal origin: Secondary | ICD-10-CM | POA: Diagnosis not present

## 2015-12-11 DIAGNOSIS — E440 Moderate protein-calorie malnutrition: Secondary | ICD-10-CM | POA: Diagnosis not present

## 2015-12-11 DIAGNOSIS — K769 Liver disease, unspecified: Secondary | ICD-10-CM | POA: Diagnosis not present

## 2015-12-11 DIAGNOSIS — D631 Anemia in chronic kidney disease: Secondary | ICD-10-CM | POA: Diagnosis not present

## 2015-12-11 DIAGNOSIS — Z23 Encounter for immunization: Secondary | ICD-10-CM | POA: Diagnosis not present

## 2015-12-11 DIAGNOSIS — N2581 Secondary hyperparathyroidism of renal origin: Secondary | ICD-10-CM | POA: Diagnosis not present

## 2015-12-11 DIAGNOSIS — N186 End stage renal disease: Secondary | ICD-10-CM | POA: Diagnosis not present

## 2015-12-12 DIAGNOSIS — Z23 Encounter for immunization: Secondary | ICD-10-CM | POA: Diagnosis not present

## 2015-12-12 DIAGNOSIS — K769 Liver disease, unspecified: Secondary | ICD-10-CM | POA: Diagnosis not present

## 2015-12-12 DIAGNOSIS — D631 Anemia in chronic kidney disease: Secondary | ICD-10-CM | POA: Diagnosis not present

## 2015-12-12 DIAGNOSIS — N2581 Secondary hyperparathyroidism of renal origin: Secondary | ICD-10-CM | POA: Diagnosis not present

## 2015-12-12 DIAGNOSIS — N186 End stage renal disease: Secondary | ICD-10-CM | POA: Diagnosis not present

## 2015-12-12 DIAGNOSIS — E440 Moderate protein-calorie malnutrition: Secondary | ICD-10-CM | POA: Diagnosis not present

## 2015-12-13 DIAGNOSIS — Z23 Encounter for immunization: Secondary | ICD-10-CM | POA: Diagnosis not present

## 2015-12-13 DIAGNOSIS — N2581 Secondary hyperparathyroidism of renal origin: Secondary | ICD-10-CM | POA: Diagnosis not present

## 2015-12-13 DIAGNOSIS — D631 Anemia in chronic kidney disease: Secondary | ICD-10-CM | POA: Diagnosis not present

## 2015-12-13 DIAGNOSIS — E440 Moderate protein-calorie malnutrition: Secondary | ICD-10-CM | POA: Diagnosis not present

## 2015-12-13 DIAGNOSIS — K769 Liver disease, unspecified: Secondary | ICD-10-CM | POA: Diagnosis not present

## 2015-12-13 DIAGNOSIS — N186 End stage renal disease: Secondary | ICD-10-CM | POA: Diagnosis not present

## 2015-12-14 DIAGNOSIS — K769 Liver disease, unspecified: Secondary | ICD-10-CM | POA: Diagnosis not present

## 2015-12-14 DIAGNOSIS — Z23 Encounter for immunization: Secondary | ICD-10-CM | POA: Diagnosis not present

## 2015-12-14 DIAGNOSIS — N2581 Secondary hyperparathyroidism of renal origin: Secondary | ICD-10-CM | POA: Diagnosis not present

## 2015-12-14 DIAGNOSIS — D631 Anemia in chronic kidney disease: Secondary | ICD-10-CM | POA: Diagnosis not present

## 2015-12-14 DIAGNOSIS — E440 Moderate protein-calorie malnutrition: Secondary | ICD-10-CM | POA: Diagnosis not present

## 2015-12-14 DIAGNOSIS — N186 End stage renal disease: Secondary | ICD-10-CM | POA: Diagnosis not present

## 2015-12-15 DIAGNOSIS — E440 Moderate protein-calorie malnutrition: Secondary | ICD-10-CM | POA: Diagnosis not present

## 2015-12-15 DIAGNOSIS — Z23 Encounter for immunization: Secondary | ICD-10-CM | POA: Diagnosis not present

## 2015-12-15 DIAGNOSIS — N186 End stage renal disease: Secondary | ICD-10-CM | POA: Diagnosis not present

## 2015-12-15 DIAGNOSIS — N2581 Secondary hyperparathyroidism of renal origin: Secondary | ICD-10-CM | POA: Diagnosis not present

## 2015-12-15 DIAGNOSIS — D631 Anemia in chronic kidney disease: Secondary | ICD-10-CM | POA: Diagnosis not present

## 2015-12-15 DIAGNOSIS — K769 Liver disease, unspecified: Secondary | ICD-10-CM | POA: Diagnosis not present

## 2015-12-16 DIAGNOSIS — Z23 Encounter for immunization: Secondary | ICD-10-CM | POA: Diagnosis not present

## 2015-12-16 DIAGNOSIS — E440 Moderate protein-calorie malnutrition: Secondary | ICD-10-CM | POA: Diagnosis not present

## 2015-12-16 DIAGNOSIS — N186 End stage renal disease: Secondary | ICD-10-CM | POA: Diagnosis not present

## 2015-12-16 DIAGNOSIS — N2581 Secondary hyperparathyroidism of renal origin: Secondary | ICD-10-CM | POA: Diagnosis not present

## 2015-12-16 DIAGNOSIS — D631 Anemia in chronic kidney disease: Secondary | ICD-10-CM | POA: Diagnosis not present

## 2015-12-16 DIAGNOSIS — K769 Liver disease, unspecified: Secondary | ICD-10-CM | POA: Diagnosis not present

## 2015-12-17 DIAGNOSIS — Z23 Encounter for immunization: Secondary | ICD-10-CM | POA: Diagnosis not present

## 2015-12-17 DIAGNOSIS — N186 End stage renal disease: Secondary | ICD-10-CM | POA: Diagnosis not present

## 2015-12-17 DIAGNOSIS — K769 Liver disease, unspecified: Secondary | ICD-10-CM | POA: Diagnosis not present

## 2015-12-17 DIAGNOSIS — N2581 Secondary hyperparathyroidism of renal origin: Secondary | ICD-10-CM | POA: Diagnosis not present

## 2015-12-17 DIAGNOSIS — E440 Moderate protein-calorie malnutrition: Secondary | ICD-10-CM | POA: Diagnosis not present

## 2015-12-17 DIAGNOSIS — D631 Anemia in chronic kidney disease: Secondary | ICD-10-CM | POA: Diagnosis not present

## 2015-12-18 DIAGNOSIS — Z23 Encounter for immunization: Secondary | ICD-10-CM | POA: Diagnosis not present

## 2015-12-18 DIAGNOSIS — K769 Liver disease, unspecified: Secondary | ICD-10-CM | POA: Diagnosis not present

## 2015-12-18 DIAGNOSIS — D631 Anemia in chronic kidney disease: Secondary | ICD-10-CM | POA: Diagnosis not present

## 2015-12-18 DIAGNOSIS — E440 Moderate protein-calorie malnutrition: Secondary | ICD-10-CM | POA: Diagnosis not present

## 2015-12-18 DIAGNOSIS — N186 End stage renal disease: Secondary | ICD-10-CM | POA: Diagnosis not present

## 2015-12-18 DIAGNOSIS — N2581 Secondary hyperparathyroidism of renal origin: Secondary | ICD-10-CM | POA: Diagnosis not present

## 2015-12-19 DIAGNOSIS — N186 End stage renal disease: Secondary | ICD-10-CM | POA: Diagnosis not present

## 2015-12-19 DIAGNOSIS — N2581 Secondary hyperparathyroidism of renal origin: Secondary | ICD-10-CM | POA: Diagnosis not present

## 2015-12-19 DIAGNOSIS — D631 Anemia in chronic kidney disease: Secondary | ICD-10-CM | POA: Diagnosis not present

## 2015-12-19 DIAGNOSIS — E440 Moderate protein-calorie malnutrition: Secondary | ICD-10-CM | POA: Diagnosis not present

## 2015-12-19 DIAGNOSIS — Z23 Encounter for immunization: Secondary | ICD-10-CM | POA: Diagnosis not present

## 2015-12-19 DIAGNOSIS — K769 Liver disease, unspecified: Secondary | ICD-10-CM | POA: Diagnosis not present

## 2015-12-20 DIAGNOSIS — D631 Anemia in chronic kidney disease: Secondary | ICD-10-CM | POA: Diagnosis not present

## 2015-12-20 DIAGNOSIS — N2581 Secondary hyperparathyroidism of renal origin: Secondary | ICD-10-CM | POA: Diagnosis not present

## 2015-12-20 DIAGNOSIS — K769 Liver disease, unspecified: Secondary | ICD-10-CM | POA: Diagnosis not present

## 2015-12-20 DIAGNOSIS — Z23 Encounter for immunization: Secondary | ICD-10-CM | POA: Diagnosis not present

## 2015-12-20 DIAGNOSIS — N186 End stage renal disease: Secondary | ICD-10-CM | POA: Diagnosis not present

## 2015-12-20 DIAGNOSIS — E440 Moderate protein-calorie malnutrition: Secondary | ICD-10-CM | POA: Diagnosis not present

## 2015-12-21 DIAGNOSIS — E440 Moderate protein-calorie malnutrition: Secondary | ICD-10-CM | POA: Diagnosis not present

## 2015-12-21 DIAGNOSIS — D631 Anemia in chronic kidney disease: Secondary | ICD-10-CM | POA: Diagnosis not present

## 2015-12-21 DIAGNOSIS — N186 End stage renal disease: Secondary | ICD-10-CM | POA: Diagnosis not present

## 2015-12-21 DIAGNOSIS — K769 Liver disease, unspecified: Secondary | ICD-10-CM | POA: Diagnosis not present

## 2015-12-21 DIAGNOSIS — N2581 Secondary hyperparathyroidism of renal origin: Secondary | ICD-10-CM | POA: Diagnosis not present

## 2015-12-21 DIAGNOSIS — Z23 Encounter for immunization: Secondary | ICD-10-CM | POA: Diagnosis not present

## 2015-12-22 DIAGNOSIS — Z23 Encounter for immunization: Secondary | ICD-10-CM | POA: Diagnosis not present

## 2015-12-22 DIAGNOSIS — K769 Liver disease, unspecified: Secondary | ICD-10-CM | POA: Diagnosis not present

## 2015-12-22 DIAGNOSIS — D631 Anemia in chronic kidney disease: Secondary | ICD-10-CM | POA: Diagnosis not present

## 2015-12-22 DIAGNOSIS — E440 Moderate protein-calorie malnutrition: Secondary | ICD-10-CM | POA: Diagnosis not present

## 2015-12-22 DIAGNOSIS — N2581 Secondary hyperparathyroidism of renal origin: Secondary | ICD-10-CM | POA: Diagnosis not present

## 2015-12-22 DIAGNOSIS — N186 End stage renal disease: Secondary | ICD-10-CM | POA: Diagnosis not present

## 2015-12-23 DIAGNOSIS — K769 Liver disease, unspecified: Secondary | ICD-10-CM | POA: Diagnosis not present

## 2015-12-23 DIAGNOSIS — N2581 Secondary hyperparathyroidism of renal origin: Secondary | ICD-10-CM | POA: Diagnosis not present

## 2015-12-23 DIAGNOSIS — E440 Moderate protein-calorie malnutrition: Secondary | ICD-10-CM | POA: Diagnosis not present

## 2015-12-23 DIAGNOSIS — N186 End stage renal disease: Secondary | ICD-10-CM | POA: Diagnosis not present

## 2015-12-23 DIAGNOSIS — Z23 Encounter for immunization: Secondary | ICD-10-CM | POA: Diagnosis not present

## 2015-12-23 DIAGNOSIS — D631 Anemia in chronic kidney disease: Secondary | ICD-10-CM | POA: Diagnosis not present

## 2015-12-24 DIAGNOSIS — E440 Moderate protein-calorie malnutrition: Secondary | ICD-10-CM | POA: Diagnosis not present

## 2015-12-24 DIAGNOSIS — Z23 Encounter for immunization: Secondary | ICD-10-CM | POA: Diagnosis not present

## 2015-12-24 DIAGNOSIS — D631 Anemia in chronic kidney disease: Secondary | ICD-10-CM | POA: Diagnosis not present

## 2015-12-24 DIAGNOSIS — K769 Liver disease, unspecified: Secondary | ICD-10-CM | POA: Diagnosis not present

## 2015-12-24 DIAGNOSIS — N186 End stage renal disease: Secondary | ICD-10-CM | POA: Diagnosis not present

## 2015-12-24 DIAGNOSIS — N2581 Secondary hyperparathyroidism of renal origin: Secondary | ICD-10-CM | POA: Diagnosis not present

## 2015-12-25 DIAGNOSIS — E440 Moderate protein-calorie malnutrition: Secondary | ICD-10-CM | POA: Diagnosis not present

## 2015-12-25 DIAGNOSIS — Z23 Encounter for immunization: Secondary | ICD-10-CM | POA: Diagnosis not present

## 2015-12-25 DIAGNOSIS — N2581 Secondary hyperparathyroidism of renal origin: Secondary | ICD-10-CM | POA: Diagnosis not present

## 2015-12-25 DIAGNOSIS — K769 Liver disease, unspecified: Secondary | ICD-10-CM | POA: Diagnosis not present

## 2015-12-25 DIAGNOSIS — D631 Anemia in chronic kidney disease: Secondary | ICD-10-CM | POA: Diagnosis not present

## 2015-12-25 DIAGNOSIS — N186 End stage renal disease: Secondary | ICD-10-CM | POA: Diagnosis not present

## 2015-12-26 DIAGNOSIS — N2581 Secondary hyperparathyroidism of renal origin: Secondary | ICD-10-CM | POA: Diagnosis not present

## 2015-12-26 DIAGNOSIS — E440 Moderate protein-calorie malnutrition: Secondary | ICD-10-CM | POA: Diagnosis not present

## 2015-12-26 DIAGNOSIS — Z23 Encounter for immunization: Secondary | ICD-10-CM | POA: Diagnosis not present

## 2015-12-26 DIAGNOSIS — N186 End stage renal disease: Secondary | ICD-10-CM | POA: Diagnosis not present

## 2015-12-26 DIAGNOSIS — D631 Anemia in chronic kidney disease: Secondary | ICD-10-CM | POA: Diagnosis not present

## 2015-12-26 DIAGNOSIS — K769 Liver disease, unspecified: Secondary | ICD-10-CM | POA: Diagnosis not present

## 2015-12-27 DIAGNOSIS — D631 Anemia in chronic kidney disease: Secondary | ICD-10-CM | POA: Diagnosis not present

## 2015-12-27 DIAGNOSIS — E440 Moderate protein-calorie malnutrition: Secondary | ICD-10-CM | POA: Diagnosis not present

## 2015-12-27 DIAGNOSIS — Z23 Encounter for immunization: Secondary | ICD-10-CM | POA: Diagnosis not present

## 2015-12-27 DIAGNOSIS — N186 End stage renal disease: Secondary | ICD-10-CM | POA: Diagnosis not present

## 2015-12-27 DIAGNOSIS — K769 Liver disease, unspecified: Secondary | ICD-10-CM | POA: Diagnosis not present

## 2015-12-27 DIAGNOSIS — N2581 Secondary hyperparathyroidism of renal origin: Secondary | ICD-10-CM | POA: Diagnosis not present

## 2015-12-28 DIAGNOSIS — K769 Liver disease, unspecified: Secondary | ICD-10-CM | POA: Diagnosis not present

## 2015-12-28 DIAGNOSIS — E440 Moderate protein-calorie malnutrition: Secondary | ICD-10-CM | POA: Diagnosis not present

## 2015-12-28 DIAGNOSIS — N186 End stage renal disease: Secondary | ICD-10-CM | POA: Diagnosis not present

## 2015-12-28 DIAGNOSIS — D631 Anemia in chronic kidney disease: Secondary | ICD-10-CM | POA: Diagnosis not present

## 2015-12-28 DIAGNOSIS — N2581 Secondary hyperparathyroidism of renal origin: Secondary | ICD-10-CM | POA: Diagnosis not present

## 2015-12-28 DIAGNOSIS — Z23 Encounter for immunization: Secondary | ICD-10-CM | POA: Diagnosis not present

## 2015-12-29 DIAGNOSIS — Z23 Encounter for immunization: Secondary | ICD-10-CM | POA: Diagnosis not present

## 2015-12-29 DIAGNOSIS — E440 Moderate protein-calorie malnutrition: Secondary | ICD-10-CM | POA: Diagnosis not present

## 2015-12-29 DIAGNOSIS — D631 Anemia in chronic kidney disease: Secondary | ICD-10-CM | POA: Diagnosis not present

## 2015-12-29 DIAGNOSIS — N2581 Secondary hyperparathyroidism of renal origin: Secondary | ICD-10-CM | POA: Diagnosis not present

## 2015-12-29 DIAGNOSIS — N186 End stage renal disease: Secondary | ICD-10-CM | POA: Diagnosis not present

## 2015-12-29 DIAGNOSIS — K769 Liver disease, unspecified: Secondary | ICD-10-CM | POA: Diagnosis not present

## 2015-12-30 DIAGNOSIS — N186 End stage renal disease: Secondary | ICD-10-CM | POA: Diagnosis not present

## 2015-12-30 DIAGNOSIS — Z23 Encounter for immunization: Secondary | ICD-10-CM | POA: Diagnosis not present

## 2015-12-30 DIAGNOSIS — D631 Anemia in chronic kidney disease: Secondary | ICD-10-CM | POA: Diagnosis not present

## 2015-12-30 DIAGNOSIS — N2581 Secondary hyperparathyroidism of renal origin: Secondary | ICD-10-CM | POA: Diagnosis not present

## 2015-12-30 DIAGNOSIS — K769 Liver disease, unspecified: Secondary | ICD-10-CM | POA: Diagnosis not present

## 2015-12-30 DIAGNOSIS — E440 Moderate protein-calorie malnutrition: Secondary | ICD-10-CM | POA: Diagnosis not present

## 2015-12-31 DIAGNOSIS — Z23 Encounter for immunization: Secondary | ICD-10-CM | POA: Diagnosis not present

## 2015-12-31 DIAGNOSIS — N186 End stage renal disease: Secondary | ICD-10-CM | POA: Diagnosis not present

## 2015-12-31 DIAGNOSIS — D631 Anemia in chronic kidney disease: Secondary | ICD-10-CM | POA: Diagnosis not present

## 2015-12-31 DIAGNOSIS — E440 Moderate protein-calorie malnutrition: Secondary | ICD-10-CM | POA: Diagnosis not present

## 2015-12-31 DIAGNOSIS — N2581 Secondary hyperparathyroidism of renal origin: Secondary | ICD-10-CM | POA: Diagnosis not present

## 2015-12-31 DIAGNOSIS — K769 Liver disease, unspecified: Secondary | ICD-10-CM | POA: Diagnosis not present

## 2016-01-01 DIAGNOSIS — K769 Liver disease, unspecified: Secondary | ICD-10-CM | POA: Diagnosis not present

## 2016-01-01 DIAGNOSIS — Z23 Encounter for immunization: Secondary | ICD-10-CM | POA: Diagnosis not present

## 2016-01-01 DIAGNOSIS — E440 Moderate protein-calorie malnutrition: Secondary | ICD-10-CM | POA: Diagnosis not present

## 2016-01-01 DIAGNOSIS — D631 Anemia in chronic kidney disease: Secondary | ICD-10-CM | POA: Diagnosis not present

## 2016-01-01 DIAGNOSIS — N186 End stage renal disease: Secondary | ICD-10-CM | POA: Diagnosis not present

## 2016-01-01 DIAGNOSIS — N2581 Secondary hyperparathyroidism of renal origin: Secondary | ICD-10-CM | POA: Diagnosis not present

## 2016-01-02 DIAGNOSIS — K769 Liver disease, unspecified: Secondary | ICD-10-CM | POA: Diagnosis not present

## 2016-01-02 DIAGNOSIS — N2581 Secondary hyperparathyroidism of renal origin: Secondary | ICD-10-CM | POA: Diagnosis not present

## 2016-01-02 DIAGNOSIS — N186 End stage renal disease: Secondary | ICD-10-CM | POA: Diagnosis not present

## 2016-01-02 DIAGNOSIS — E440 Moderate protein-calorie malnutrition: Secondary | ICD-10-CM | POA: Diagnosis not present

## 2016-01-02 DIAGNOSIS — Z23 Encounter for immunization: Secondary | ICD-10-CM | POA: Diagnosis not present

## 2016-01-02 DIAGNOSIS — N2889 Other specified disorders of kidney and ureter: Secondary | ICD-10-CM | POA: Diagnosis not present

## 2016-01-02 DIAGNOSIS — Z992 Dependence on renal dialysis: Secondary | ICD-10-CM | POA: Diagnosis not present

## 2016-01-02 DIAGNOSIS — D631 Anemia in chronic kidney disease: Secondary | ICD-10-CM | POA: Diagnosis not present

## 2016-01-03 DIAGNOSIS — Z79899 Other long term (current) drug therapy: Secondary | ICD-10-CM | POA: Diagnosis not present

## 2016-01-03 DIAGNOSIS — E440 Moderate protein-calorie malnutrition: Secondary | ICD-10-CM | POA: Diagnosis not present

## 2016-01-03 DIAGNOSIS — K769 Liver disease, unspecified: Secondary | ICD-10-CM | POA: Diagnosis not present

## 2016-01-03 DIAGNOSIS — D509 Iron deficiency anemia, unspecified: Secondary | ICD-10-CM | POA: Diagnosis not present

## 2016-01-03 DIAGNOSIS — N186 End stage renal disease: Secondary | ICD-10-CM | POA: Diagnosis not present

## 2016-01-03 DIAGNOSIS — D631 Anemia in chronic kidney disease: Secondary | ICD-10-CM | POA: Diagnosis not present

## 2016-01-03 DIAGNOSIS — N2581 Secondary hyperparathyroidism of renal origin: Secondary | ICD-10-CM | POA: Diagnosis not present

## 2016-01-04 DIAGNOSIS — N186 End stage renal disease: Secondary | ICD-10-CM | POA: Diagnosis not present

## 2016-01-04 DIAGNOSIS — N2581 Secondary hyperparathyroidism of renal origin: Secondary | ICD-10-CM | POA: Diagnosis not present

## 2016-01-04 DIAGNOSIS — D631 Anemia in chronic kidney disease: Secondary | ICD-10-CM | POA: Diagnosis not present

## 2016-01-04 DIAGNOSIS — D509 Iron deficiency anemia, unspecified: Secondary | ICD-10-CM | POA: Diagnosis not present

## 2016-01-04 DIAGNOSIS — K769 Liver disease, unspecified: Secondary | ICD-10-CM | POA: Diagnosis not present

## 2016-01-04 DIAGNOSIS — R8299 Other abnormal findings in urine: Secondary | ICD-10-CM | POA: Diagnosis not present

## 2016-01-04 DIAGNOSIS — Z79899 Other long term (current) drug therapy: Secondary | ICD-10-CM | POA: Diagnosis not present

## 2016-01-05 DIAGNOSIS — N2581 Secondary hyperparathyroidism of renal origin: Secondary | ICD-10-CM | POA: Diagnosis not present

## 2016-01-05 DIAGNOSIS — N186 End stage renal disease: Secondary | ICD-10-CM | POA: Diagnosis not present

## 2016-01-05 DIAGNOSIS — D631 Anemia in chronic kidney disease: Secondary | ICD-10-CM | POA: Diagnosis not present

## 2016-01-05 DIAGNOSIS — K769 Liver disease, unspecified: Secondary | ICD-10-CM | POA: Diagnosis not present

## 2016-01-05 DIAGNOSIS — Z79899 Other long term (current) drug therapy: Secondary | ICD-10-CM | POA: Diagnosis not present

## 2016-01-05 DIAGNOSIS — D509 Iron deficiency anemia, unspecified: Secondary | ICD-10-CM | POA: Diagnosis not present

## 2016-01-06 DIAGNOSIS — Z79899 Other long term (current) drug therapy: Secondary | ICD-10-CM | POA: Diagnosis not present

## 2016-01-06 DIAGNOSIS — N2581 Secondary hyperparathyroidism of renal origin: Secondary | ICD-10-CM | POA: Diagnosis not present

## 2016-01-06 DIAGNOSIS — K769 Liver disease, unspecified: Secondary | ICD-10-CM | POA: Diagnosis not present

## 2016-01-06 DIAGNOSIS — D631 Anemia in chronic kidney disease: Secondary | ICD-10-CM | POA: Diagnosis not present

## 2016-01-06 DIAGNOSIS — D509 Iron deficiency anemia, unspecified: Secondary | ICD-10-CM | POA: Diagnosis not present

## 2016-01-06 DIAGNOSIS — N186 End stage renal disease: Secondary | ICD-10-CM | POA: Diagnosis not present

## 2016-01-07 DIAGNOSIS — D509 Iron deficiency anemia, unspecified: Secondary | ICD-10-CM | POA: Diagnosis not present

## 2016-01-07 DIAGNOSIS — K769 Liver disease, unspecified: Secondary | ICD-10-CM | POA: Diagnosis not present

## 2016-01-07 DIAGNOSIS — Z79899 Other long term (current) drug therapy: Secondary | ICD-10-CM | POA: Diagnosis not present

## 2016-01-07 DIAGNOSIS — N186 End stage renal disease: Secondary | ICD-10-CM | POA: Diagnosis not present

## 2016-01-07 DIAGNOSIS — D631 Anemia in chronic kidney disease: Secondary | ICD-10-CM | POA: Diagnosis not present

## 2016-01-07 DIAGNOSIS — N2581 Secondary hyperparathyroidism of renal origin: Secondary | ICD-10-CM | POA: Diagnosis not present

## 2016-01-08 DIAGNOSIS — Z79899 Other long term (current) drug therapy: Secondary | ICD-10-CM | POA: Diagnosis not present

## 2016-01-08 DIAGNOSIS — D631 Anemia in chronic kidney disease: Secondary | ICD-10-CM | POA: Diagnosis not present

## 2016-01-08 DIAGNOSIS — N2581 Secondary hyperparathyroidism of renal origin: Secondary | ICD-10-CM | POA: Diagnosis not present

## 2016-01-08 DIAGNOSIS — D509 Iron deficiency anemia, unspecified: Secondary | ICD-10-CM | POA: Diagnosis not present

## 2016-01-08 DIAGNOSIS — K769 Liver disease, unspecified: Secondary | ICD-10-CM | POA: Diagnosis not present

## 2016-01-08 DIAGNOSIS — N186 End stage renal disease: Secondary | ICD-10-CM | POA: Diagnosis not present

## 2016-01-09 DIAGNOSIS — D631 Anemia in chronic kidney disease: Secondary | ICD-10-CM | POA: Diagnosis not present

## 2016-01-09 DIAGNOSIS — N186 End stage renal disease: Secondary | ICD-10-CM | POA: Diagnosis not present

## 2016-01-09 DIAGNOSIS — D509 Iron deficiency anemia, unspecified: Secondary | ICD-10-CM | POA: Diagnosis not present

## 2016-01-09 DIAGNOSIS — N2581 Secondary hyperparathyroidism of renal origin: Secondary | ICD-10-CM | POA: Diagnosis not present

## 2016-01-09 DIAGNOSIS — Z79899 Other long term (current) drug therapy: Secondary | ICD-10-CM | POA: Diagnosis not present

## 2016-01-09 DIAGNOSIS — K769 Liver disease, unspecified: Secondary | ICD-10-CM | POA: Diagnosis not present

## 2016-01-10 DIAGNOSIS — N2581 Secondary hyperparathyroidism of renal origin: Secondary | ICD-10-CM | POA: Diagnosis not present

## 2016-01-10 DIAGNOSIS — N186 End stage renal disease: Secondary | ICD-10-CM | POA: Diagnosis not present

## 2016-01-10 DIAGNOSIS — D631 Anemia in chronic kidney disease: Secondary | ICD-10-CM | POA: Diagnosis not present

## 2016-01-10 DIAGNOSIS — D509 Iron deficiency anemia, unspecified: Secondary | ICD-10-CM | POA: Diagnosis not present

## 2016-01-10 DIAGNOSIS — Z79899 Other long term (current) drug therapy: Secondary | ICD-10-CM | POA: Diagnosis not present

## 2016-01-10 DIAGNOSIS — K769 Liver disease, unspecified: Secondary | ICD-10-CM | POA: Diagnosis not present

## 2016-01-11 DIAGNOSIS — N186 End stage renal disease: Secondary | ICD-10-CM | POA: Diagnosis not present

## 2016-01-11 DIAGNOSIS — N2581 Secondary hyperparathyroidism of renal origin: Secondary | ICD-10-CM | POA: Diagnosis not present

## 2016-01-11 DIAGNOSIS — Z79899 Other long term (current) drug therapy: Secondary | ICD-10-CM | POA: Diagnosis not present

## 2016-01-11 DIAGNOSIS — K769 Liver disease, unspecified: Secondary | ICD-10-CM | POA: Diagnosis not present

## 2016-01-11 DIAGNOSIS — D631 Anemia in chronic kidney disease: Secondary | ICD-10-CM | POA: Diagnosis not present

## 2016-01-11 DIAGNOSIS — D509 Iron deficiency anemia, unspecified: Secondary | ICD-10-CM | POA: Diagnosis not present

## 2016-01-12 DIAGNOSIS — N186 End stage renal disease: Secondary | ICD-10-CM | POA: Diagnosis not present

## 2016-01-12 DIAGNOSIS — N2581 Secondary hyperparathyroidism of renal origin: Secondary | ICD-10-CM | POA: Diagnosis not present

## 2016-01-12 DIAGNOSIS — Z79899 Other long term (current) drug therapy: Secondary | ICD-10-CM | POA: Diagnosis not present

## 2016-01-12 DIAGNOSIS — D509 Iron deficiency anemia, unspecified: Secondary | ICD-10-CM | POA: Diagnosis not present

## 2016-01-12 DIAGNOSIS — K769 Liver disease, unspecified: Secondary | ICD-10-CM | POA: Diagnosis not present

## 2016-01-12 DIAGNOSIS — D631 Anemia in chronic kidney disease: Secondary | ICD-10-CM | POA: Diagnosis not present

## 2016-01-13 DIAGNOSIS — D509 Iron deficiency anemia, unspecified: Secondary | ICD-10-CM | POA: Diagnosis not present

## 2016-01-13 DIAGNOSIS — K769 Liver disease, unspecified: Secondary | ICD-10-CM | POA: Diagnosis not present

## 2016-01-13 DIAGNOSIS — N2581 Secondary hyperparathyroidism of renal origin: Secondary | ICD-10-CM | POA: Diagnosis not present

## 2016-01-13 DIAGNOSIS — Z79899 Other long term (current) drug therapy: Secondary | ICD-10-CM | POA: Diagnosis not present

## 2016-01-13 DIAGNOSIS — D631 Anemia in chronic kidney disease: Secondary | ICD-10-CM | POA: Diagnosis not present

## 2016-01-13 DIAGNOSIS — N186 End stage renal disease: Secondary | ICD-10-CM | POA: Diagnosis not present

## 2016-01-14 DIAGNOSIS — D509 Iron deficiency anemia, unspecified: Secondary | ICD-10-CM | POA: Diagnosis not present

## 2016-01-14 DIAGNOSIS — N2581 Secondary hyperparathyroidism of renal origin: Secondary | ICD-10-CM | POA: Diagnosis not present

## 2016-01-14 DIAGNOSIS — N186 End stage renal disease: Secondary | ICD-10-CM | POA: Diagnosis not present

## 2016-01-14 DIAGNOSIS — D631 Anemia in chronic kidney disease: Secondary | ICD-10-CM | POA: Diagnosis not present

## 2016-01-14 DIAGNOSIS — Z79899 Other long term (current) drug therapy: Secondary | ICD-10-CM | POA: Diagnosis not present

## 2016-01-14 DIAGNOSIS — K769 Liver disease, unspecified: Secondary | ICD-10-CM | POA: Diagnosis not present

## 2016-01-15 DIAGNOSIS — N186 End stage renal disease: Secondary | ICD-10-CM | POA: Diagnosis not present

## 2016-01-15 DIAGNOSIS — K769 Liver disease, unspecified: Secondary | ICD-10-CM | POA: Diagnosis not present

## 2016-01-15 DIAGNOSIS — Z79899 Other long term (current) drug therapy: Secondary | ICD-10-CM | POA: Diagnosis not present

## 2016-01-15 DIAGNOSIS — D509 Iron deficiency anemia, unspecified: Secondary | ICD-10-CM | POA: Diagnosis not present

## 2016-01-15 DIAGNOSIS — N2581 Secondary hyperparathyroidism of renal origin: Secondary | ICD-10-CM | POA: Diagnosis not present

## 2016-01-15 DIAGNOSIS — D631 Anemia in chronic kidney disease: Secondary | ICD-10-CM | POA: Diagnosis not present

## 2016-01-16 DIAGNOSIS — D509 Iron deficiency anemia, unspecified: Secondary | ICD-10-CM | POA: Diagnosis not present

## 2016-01-16 DIAGNOSIS — D631 Anemia in chronic kidney disease: Secondary | ICD-10-CM | POA: Diagnosis not present

## 2016-01-16 DIAGNOSIS — K769 Liver disease, unspecified: Secondary | ICD-10-CM | POA: Diagnosis not present

## 2016-01-16 DIAGNOSIS — N186 End stage renal disease: Secondary | ICD-10-CM | POA: Diagnosis not present

## 2016-01-16 DIAGNOSIS — N2581 Secondary hyperparathyroidism of renal origin: Secondary | ICD-10-CM | POA: Diagnosis not present

## 2016-01-16 DIAGNOSIS — Z79899 Other long term (current) drug therapy: Secondary | ICD-10-CM | POA: Diagnosis not present

## 2016-01-17 DIAGNOSIS — Z79899 Other long term (current) drug therapy: Secondary | ICD-10-CM | POA: Diagnosis not present

## 2016-01-17 DIAGNOSIS — N2581 Secondary hyperparathyroidism of renal origin: Secondary | ICD-10-CM | POA: Diagnosis not present

## 2016-01-17 DIAGNOSIS — K769 Liver disease, unspecified: Secondary | ICD-10-CM | POA: Diagnosis not present

## 2016-01-17 DIAGNOSIS — N186 End stage renal disease: Secondary | ICD-10-CM | POA: Diagnosis not present

## 2016-01-17 DIAGNOSIS — D631 Anemia in chronic kidney disease: Secondary | ICD-10-CM | POA: Diagnosis not present

## 2016-01-17 DIAGNOSIS — D509 Iron deficiency anemia, unspecified: Secondary | ICD-10-CM | POA: Diagnosis not present

## 2016-01-18 DIAGNOSIS — D509 Iron deficiency anemia, unspecified: Secondary | ICD-10-CM | POA: Diagnosis not present

## 2016-01-18 DIAGNOSIS — N2581 Secondary hyperparathyroidism of renal origin: Secondary | ICD-10-CM | POA: Diagnosis not present

## 2016-01-18 DIAGNOSIS — Z79899 Other long term (current) drug therapy: Secondary | ICD-10-CM | POA: Diagnosis not present

## 2016-01-18 DIAGNOSIS — N186 End stage renal disease: Secondary | ICD-10-CM | POA: Diagnosis not present

## 2016-01-18 DIAGNOSIS — D631 Anemia in chronic kidney disease: Secondary | ICD-10-CM | POA: Diagnosis not present

## 2016-01-18 DIAGNOSIS — K769 Liver disease, unspecified: Secondary | ICD-10-CM | POA: Diagnosis not present

## 2016-01-19 DIAGNOSIS — D631 Anemia in chronic kidney disease: Secondary | ICD-10-CM | POA: Diagnosis not present

## 2016-01-19 DIAGNOSIS — N2581 Secondary hyperparathyroidism of renal origin: Secondary | ICD-10-CM | POA: Diagnosis not present

## 2016-01-19 DIAGNOSIS — Z79899 Other long term (current) drug therapy: Secondary | ICD-10-CM | POA: Diagnosis not present

## 2016-01-19 DIAGNOSIS — N186 End stage renal disease: Secondary | ICD-10-CM | POA: Diagnosis not present

## 2016-01-19 DIAGNOSIS — K769 Liver disease, unspecified: Secondary | ICD-10-CM | POA: Diagnosis not present

## 2016-01-19 DIAGNOSIS — D509 Iron deficiency anemia, unspecified: Secondary | ICD-10-CM | POA: Diagnosis not present

## 2016-01-20 DIAGNOSIS — K769 Liver disease, unspecified: Secondary | ICD-10-CM | POA: Diagnosis not present

## 2016-01-20 DIAGNOSIS — D631 Anemia in chronic kidney disease: Secondary | ICD-10-CM | POA: Diagnosis not present

## 2016-01-20 DIAGNOSIS — D509 Iron deficiency anemia, unspecified: Secondary | ICD-10-CM | POA: Diagnosis not present

## 2016-01-20 DIAGNOSIS — N2581 Secondary hyperparathyroidism of renal origin: Secondary | ICD-10-CM | POA: Diagnosis not present

## 2016-01-20 DIAGNOSIS — N186 End stage renal disease: Secondary | ICD-10-CM | POA: Diagnosis not present

## 2016-01-20 DIAGNOSIS — Z79899 Other long term (current) drug therapy: Secondary | ICD-10-CM | POA: Diagnosis not present

## 2016-01-21 DIAGNOSIS — Z79899 Other long term (current) drug therapy: Secondary | ICD-10-CM | POA: Diagnosis not present

## 2016-01-21 DIAGNOSIS — D631 Anemia in chronic kidney disease: Secondary | ICD-10-CM | POA: Diagnosis not present

## 2016-01-21 DIAGNOSIS — D509 Iron deficiency anemia, unspecified: Secondary | ICD-10-CM | POA: Diagnosis not present

## 2016-01-21 DIAGNOSIS — N2581 Secondary hyperparathyroidism of renal origin: Secondary | ICD-10-CM | POA: Diagnosis not present

## 2016-01-21 DIAGNOSIS — K769 Liver disease, unspecified: Secondary | ICD-10-CM | POA: Diagnosis not present

## 2016-01-21 DIAGNOSIS — N186 End stage renal disease: Secondary | ICD-10-CM | POA: Diagnosis not present

## 2016-01-22 DIAGNOSIS — N186 End stage renal disease: Secondary | ICD-10-CM | POA: Diagnosis not present

## 2016-01-22 DIAGNOSIS — D631 Anemia in chronic kidney disease: Secondary | ICD-10-CM | POA: Diagnosis not present

## 2016-01-22 DIAGNOSIS — Z79899 Other long term (current) drug therapy: Secondary | ICD-10-CM | POA: Diagnosis not present

## 2016-01-22 DIAGNOSIS — K769 Liver disease, unspecified: Secondary | ICD-10-CM | POA: Diagnosis not present

## 2016-01-22 DIAGNOSIS — D509 Iron deficiency anemia, unspecified: Secondary | ICD-10-CM | POA: Diagnosis not present

## 2016-01-22 DIAGNOSIS — N2581 Secondary hyperparathyroidism of renal origin: Secondary | ICD-10-CM | POA: Diagnosis not present

## 2016-01-23 DIAGNOSIS — Z79899 Other long term (current) drug therapy: Secondary | ICD-10-CM | POA: Diagnosis not present

## 2016-01-23 DIAGNOSIS — N186 End stage renal disease: Secondary | ICD-10-CM | POA: Diagnosis not present

## 2016-01-23 DIAGNOSIS — D631 Anemia in chronic kidney disease: Secondary | ICD-10-CM | POA: Diagnosis not present

## 2016-01-23 DIAGNOSIS — D509 Iron deficiency anemia, unspecified: Secondary | ICD-10-CM | POA: Diagnosis not present

## 2016-01-23 DIAGNOSIS — N2581 Secondary hyperparathyroidism of renal origin: Secondary | ICD-10-CM | POA: Diagnosis not present

## 2016-01-23 DIAGNOSIS — K769 Liver disease, unspecified: Secondary | ICD-10-CM | POA: Diagnosis not present

## 2016-01-24 DIAGNOSIS — Z79899 Other long term (current) drug therapy: Secondary | ICD-10-CM | POA: Diagnosis not present

## 2016-01-24 DIAGNOSIS — N2581 Secondary hyperparathyroidism of renal origin: Secondary | ICD-10-CM | POA: Diagnosis not present

## 2016-01-24 DIAGNOSIS — D509 Iron deficiency anemia, unspecified: Secondary | ICD-10-CM | POA: Diagnosis not present

## 2016-01-24 DIAGNOSIS — N186 End stage renal disease: Secondary | ICD-10-CM | POA: Diagnosis not present

## 2016-01-24 DIAGNOSIS — D631 Anemia in chronic kidney disease: Secondary | ICD-10-CM | POA: Diagnosis not present

## 2016-01-24 DIAGNOSIS — K769 Liver disease, unspecified: Secondary | ICD-10-CM | POA: Diagnosis not present

## 2016-01-25 DIAGNOSIS — Z79899 Other long term (current) drug therapy: Secondary | ICD-10-CM | POA: Diagnosis not present

## 2016-01-25 DIAGNOSIS — N186 End stage renal disease: Secondary | ICD-10-CM | POA: Diagnosis not present

## 2016-01-25 DIAGNOSIS — D631 Anemia in chronic kidney disease: Secondary | ICD-10-CM | POA: Diagnosis not present

## 2016-01-25 DIAGNOSIS — D509 Iron deficiency anemia, unspecified: Secondary | ICD-10-CM | POA: Diagnosis not present

## 2016-01-25 DIAGNOSIS — N2581 Secondary hyperparathyroidism of renal origin: Secondary | ICD-10-CM | POA: Diagnosis not present

## 2016-01-25 DIAGNOSIS — K769 Liver disease, unspecified: Secondary | ICD-10-CM | POA: Diagnosis not present

## 2016-01-26 DIAGNOSIS — N2581 Secondary hyperparathyroidism of renal origin: Secondary | ICD-10-CM | POA: Diagnosis not present

## 2016-01-26 DIAGNOSIS — D509 Iron deficiency anemia, unspecified: Secondary | ICD-10-CM | POA: Diagnosis not present

## 2016-01-26 DIAGNOSIS — N186 End stage renal disease: Secondary | ICD-10-CM | POA: Diagnosis not present

## 2016-01-26 DIAGNOSIS — D631 Anemia in chronic kidney disease: Secondary | ICD-10-CM | POA: Diagnosis not present

## 2016-01-26 DIAGNOSIS — K769 Liver disease, unspecified: Secondary | ICD-10-CM | POA: Diagnosis not present

## 2016-01-26 DIAGNOSIS — Z79899 Other long term (current) drug therapy: Secondary | ICD-10-CM | POA: Diagnosis not present

## 2016-01-27 DIAGNOSIS — N186 End stage renal disease: Secondary | ICD-10-CM | POA: Diagnosis not present

## 2016-01-27 DIAGNOSIS — N2581 Secondary hyperparathyroidism of renal origin: Secondary | ICD-10-CM | POA: Diagnosis not present

## 2016-01-27 DIAGNOSIS — Z79899 Other long term (current) drug therapy: Secondary | ICD-10-CM | POA: Diagnosis not present

## 2016-01-27 DIAGNOSIS — D509 Iron deficiency anemia, unspecified: Secondary | ICD-10-CM | POA: Diagnosis not present

## 2016-01-27 DIAGNOSIS — D631 Anemia in chronic kidney disease: Secondary | ICD-10-CM | POA: Diagnosis not present

## 2016-01-27 DIAGNOSIS — K769 Liver disease, unspecified: Secondary | ICD-10-CM | POA: Diagnosis not present

## 2016-01-28 DIAGNOSIS — K769 Liver disease, unspecified: Secondary | ICD-10-CM | POA: Diagnosis not present

## 2016-01-28 DIAGNOSIS — D509 Iron deficiency anemia, unspecified: Secondary | ICD-10-CM | POA: Diagnosis not present

## 2016-01-28 DIAGNOSIS — D631 Anemia in chronic kidney disease: Secondary | ICD-10-CM | POA: Diagnosis not present

## 2016-01-28 DIAGNOSIS — Z79899 Other long term (current) drug therapy: Secondary | ICD-10-CM | POA: Diagnosis not present

## 2016-01-28 DIAGNOSIS — N2581 Secondary hyperparathyroidism of renal origin: Secondary | ICD-10-CM | POA: Diagnosis not present

## 2016-01-28 DIAGNOSIS — N186 End stage renal disease: Secondary | ICD-10-CM | POA: Diagnosis not present

## 2016-01-29 DIAGNOSIS — Z79899 Other long term (current) drug therapy: Secondary | ICD-10-CM | POA: Diagnosis not present

## 2016-01-29 DIAGNOSIS — N186 End stage renal disease: Secondary | ICD-10-CM | POA: Diagnosis not present

## 2016-01-29 DIAGNOSIS — D631 Anemia in chronic kidney disease: Secondary | ICD-10-CM | POA: Diagnosis not present

## 2016-01-29 DIAGNOSIS — N2581 Secondary hyperparathyroidism of renal origin: Secondary | ICD-10-CM | POA: Diagnosis not present

## 2016-01-29 DIAGNOSIS — K769 Liver disease, unspecified: Secondary | ICD-10-CM | POA: Diagnosis not present

## 2016-01-29 DIAGNOSIS — D509 Iron deficiency anemia, unspecified: Secondary | ICD-10-CM | POA: Diagnosis not present

## 2016-01-30 DIAGNOSIS — N2581 Secondary hyperparathyroidism of renal origin: Secondary | ICD-10-CM | POA: Diagnosis not present

## 2016-01-30 DIAGNOSIS — D631 Anemia in chronic kidney disease: Secondary | ICD-10-CM | POA: Diagnosis not present

## 2016-01-30 DIAGNOSIS — Z79899 Other long term (current) drug therapy: Secondary | ICD-10-CM | POA: Diagnosis not present

## 2016-01-30 DIAGNOSIS — D509 Iron deficiency anemia, unspecified: Secondary | ICD-10-CM | POA: Diagnosis not present

## 2016-01-30 DIAGNOSIS — K769 Liver disease, unspecified: Secondary | ICD-10-CM | POA: Diagnosis not present

## 2016-01-30 DIAGNOSIS — N186 End stage renal disease: Secondary | ICD-10-CM | POA: Diagnosis not present

## 2016-01-31 DIAGNOSIS — N186 End stage renal disease: Secondary | ICD-10-CM | POA: Diagnosis not present

## 2016-01-31 DIAGNOSIS — D509 Iron deficiency anemia, unspecified: Secondary | ICD-10-CM | POA: Diagnosis not present

## 2016-01-31 DIAGNOSIS — Z79899 Other long term (current) drug therapy: Secondary | ICD-10-CM | POA: Diagnosis not present

## 2016-01-31 DIAGNOSIS — K769 Liver disease, unspecified: Secondary | ICD-10-CM | POA: Diagnosis not present

## 2016-01-31 DIAGNOSIS — N2581 Secondary hyperparathyroidism of renal origin: Secondary | ICD-10-CM | POA: Diagnosis not present

## 2016-01-31 DIAGNOSIS — D631 Anemia in chronic kidney disease: Secondary | ICD-10-CM | POA: Diagnosis not present

## 2016-02-01 DIAGNOSIS — N2581 Secondary hyperparathyroidism of renal origin: Secondary | ICD-10-CM | POA: Diagnosis not present

## 2016-02-01 DIAGNOSIS — Z79899 Other long term (current) drug therapy: Secondary | ICD-10-CM | POA: Diagnosis not present

## 2016-02-01 DIAGNOSIS — N186 End stage renal disease: Secondary | ICD-10-CM | POA: Diagnosis not present

## 2016-02-01 DIAGNOSIS — K769 Liver disease, unspecified: Secondary | ICD-10-CM | POA: Diagnosis not present

## 2016-02-01 DIAGNOSIS — D631 Anemia in chronic kidney disease: Secondary | ICD-10-CM | POA: Diagnosis not present

## 2016-02-01 DIAGNOSIS — D509 Iron deficiency anemia, unspecified: Secondary | ICD-10-CM | POA: Diagnosis not present

## 2016-02-02 DIAGNOSIS — K769 Liver disease, unspecified: Secondary | ICD-10-CM | POA: Diagnosis not present

## 2016-02-02 DIAGNOSIS — D509 Iron deficiency anemia, unspecified: Secondary | ICD-10-CM | POA: Diagnosis not present

## 2016-02-02 DIAGNOSIS — N186 End stage renal disease: Secondary | ICD-10-CM | POA: Diagnosis not present

## 2016-02-02 DIAGNOSIS — D631 Anemia in chronic kidney disease: Secondary | ICD-10-CM | POA: Diagnosis not present

## 2016-02-02 DIAGNOSIS — N2889 Other specified disorders of kidney and ureter: Secondary | ICD-10-CM | POA: Diagnosis not present

## 2016-02-02 DIAGNOSIS — Z79899 Other long term (current) drug therapy: Secondary | ICD-10-CM | POA: Diagnosis not present

## 2016-02-02 DIAGNOSIS — N2581 Secondary hyperparathyroidism of renal origin: Secondary | ICD-10-CM | POA: Diagnosis not present

## 2016-02-02 DIAGNOSIS — Z992 Dependence on renal dialysis: Secondary | ICD-10-CM | POA: Diagnosis not present

## 2016-02-03 DIAGNOSIS — D631 Anemia in chronic kidney disease: Secondary | ICD-10-CM | POA: Diagnosis not present

## 2016-02-03 DIAGNOSIS — Z79899 Other long term (current) drug therapy: Secondary | ICD-10-CM | POA: Diagnosis not present

## 2016-02-03 DIAGNOSIS — E440 Moderate protein-calorie malnutrition: Secondary | ICD-10-CM | POA: Diagnosis not present

## 2016-02-03 DIAGNOSIS — N186 End stage renal disease: Secondary | ICD-10-CM | POA: Diagnosis not present

## 2016-02-03 DIAGNOSIS — N2581 Secondary hyperparathyroidism of renal origin: Secondary | ICD-10-CM | POA: Diagnosis not present

## 2016-02-03 DIAGNOSIS — D509 Iron deficiency anemia, unspecified: Secondary | ICD-10-CM | POA: Diagnosis not present

## 2016-02-04 DIAGNOSIS — N2581 Secondary hyperparathyroidism of renal origin: Secondary | ICD-10-CM | POA: Diagnosis not present

## 2016-02-04 DIAGNOSIS — N186 End stage renal disease: Secondary | ICD-10-CM | POA: Diagnosis not present

## 2016-02-04 DIAGNOSIS — E440 Moderate protein-calorie malnutrition: Secondary | ICD-10-CM | POA: Diagnosis not present

## 2016-02-04 DIAGNOSIS — D509 Iron deficiency anemia, unspecified: Secondary | ICD-10-CM | POA: Diagnosis not present

## 2016-02-04 DIAGNOSIS — D631 Anemia in chronic kidney disease: Secondary | ICD-10-CM | POA: Diagnosis not present

## 2016-02-04 DIAGNOSIS — Z79899 Other long term (current) drug therapy: Secondary | ICD-10-CM | POA: Diagnosis not present

## 2016-02-05 DIAGNOSIS — E440 Moderate protein-calorie malnutrition: Secondary | ICD-10-CM | POA: Diagnosis not present

## 2016-02-05 DIAGNOSIS — Z79899 Other long term (current) drug therapy: Secondary | ICD-10-CM | POA: Diagnosis not present

## 2016-02-05 DIAGNOSIS — D509 Iron deficiency anemia, unspecified: Secondary | ICD-10-CM | POA: Diagnosis not present

## 2016-02-05 DIAGNOSIS — N2581 Secondary hyperparathyroidism of renal origin: Secondary | ICD-10-CM | POA: Diagnosis not present

## 2016-02-05 DIAGNOSIS — N186 End stage renal disease: Secondary | ICD-10-CM | POA: Diagnosis not present

## 2016-02-05 DIAGNOSIS — D631 Anemia in chronic kidney disease: Secondary | ICD-10-CM | POA: Diagnosis not present

## 2016-02-06 DIAGNOSIS — N2581 Secondary hyperparathyroidism of renal origin: Secondary | ICD-10-CM | POA: Diagnosis not present

## 2016-02-06 DIAGNOSIS — E440 Moderate protein-calorie malnutrition: Secondary | ICD-10-CM | POA: Diagnosis not present

## 2016-02-06 DIAGNOSIS — Z79899 Other long term (current) drug therapy: Secondary | ICD-10-CM | POA: Diagnosis not present

## 2016-02-06 DIAGNOSIS — D631 Anemia in chronic kidney disease: Secondary | ICD-10-CM | POA: Diagnosis not present

## 2016-02-06 DIAGNOSIS — D509 Iron deficiency anemia, unspecified: Secondary | ICD-10-CM | POA: Diagnosis not present

## 2016-02-06 DIAGNOSIS — N186 End stage renal disease: Secondary | ICD-10-CM | POA: Diagnosis not present

## 2016-02-07 DIAGNOSIS — E440 Moderate protein-calorie malnutrition: Secondary | ICD-10-CM | POA: Diagnosis not present

## 2016-02-07 DIAGNOSIS — D509 Iron deficiency anemia, unspecified: Secondary | ICD-10-CM | POA: Diagnosis not present

## 2016-02-07 DIAGNOSIS — D631 Anemia in chronic kidney disease: Secondary | ICD-10-CM | POA: Diagnosis not present

## 2016-02-07 DIAGNOSIS — N186 End stage renal disease: Secondary | ICD-10-CM | POA: Diagnosis not present

## 2016-02-07 DIAGNOSIS — N2581 Secondary hyperparathyroidism of renal origin: Secondary | ICD-10-CM | POA: Diagnosis not present

## 2016-02-07 DIAGNOSIS — Z79899 Other long term (current) drug therapy: Secondary | ICD-10-CM | POA: Diagnosis not present

## 2016-02-08 DIAGNOSIS — Z79899 Other long term (current) drug therapy: Secondary | ICD-10-CM | POA: Diagnosis not present

## 2016-02-08 DIAGNOSIS — E440 Moderate protein-calorie malnutrition: Secondary | ICD-10-CM | POA: Diagnosis not present

## 2016-02-08 DIAGNOSIS — D631 Anemia in chronic kidney disease: Secondary | ICD-10-CM | POA: Diagnosis not present

## 2016-02-08 DIAGNOSIS — N186 End stage renal disease: Secondary | ICD-10-CM | POA: Diagnosis not present

## 2016-02-08 DIAGNOSIS — D509 Iron deficiency anemia, unspecified: Secondary | ICD-10-CM | POA: Diagnosis not present

## 2016-02-08 DIAGNOSIS — N2581 Secondary hyperparathyroidism of renal origin: Secondary | ICD-10-CM | POA: Diagnosis not present

## 2016-02-09 DIAGNOSIS — Z79899 Other long term (current) drug therapy: Secondary | ICD-10-CM | POA: Diagnosis not present

## 2016-02-09 DIAGNOSIS — D509 Iron deficiency anemia, unspecified: Secondary | ICD-10-CM | POA: Diagnosis not present

## 2016-02-09 DIAGNOSIS — N2581 Secondary hyperparathyroidism of renal origin: Secondary | ICD-10-CM | POA: Diagnosis not present

## 2016-02-09 DIAGNOSIS — E440 Moderate protein-calorie malnutrition: Secondary | ICD-10-CM | POA: Diagnosis not present

## 2016-02-09 DIAGNOSIS — N186 End stage renal disease: Secondary | ICD-10-CM | POA: Diagnosis not present

## 2016-02-09 DIAGNOSIS — D631 Anemia in chronic kidney disease: Secondary | ICD-10-CM | POA: Diagnosis not present

## 2016-02-10 DIAGNOSIS — N186 End stage renal disease: Secondary | ICD-10-CM | POA: Diagnosis not present

## 2016-02-10 DIAGNOSIS — N2581 Secondary hyperparathyroidism of renal origin: Secondary | ICD-10-CM | POA: Diagnosis not present

## 2016-02-10 DIAGNOSIS — Z79899 Other long term (current) drug therapy: Secondary | ICD-10-CM | POA: Diagnosis not present

## 2016-02-10 DIAGNOSIS — R8299 Other abnormal findings in urine: Secondary | ICD-10-CM | POA: Diagnosis not present

## 2016-02-10 DIAGNOSIS — D631 Anemia in chronic kidney disease: Secondary | ICD-10-CM | POA: Diagnosis not present

## 2016-02-10 DIAGNOSIS — D509 Iron deficiency anemia, unspecified: Secondary | ICD-10-CM | POA: Diagnosis not present

## 2016-02-10 DIAGNOSIS — E440 Moderate protein-calorie malnutrition: Secondary | ICD-10-CM | POA: Diagnosis not present

## 2016-02-11 DIAGNOSIS — N186 End stage renal disease: Secondary | ICD-10-CM | POA: Diagnosis not present

## 2016-02-11 DIAGNOSIS — E440 Moderate protein-calorie malnutrition: Secondary | ICD-10-CM | POA: Diagnosis not present

## 2016-02-11 DIAGNOSIS — N2581 Secondary hyperparathyroidism of renal origin: Secondary | ICD-10-CM | POA: Diagnosis not present

## 2016-02-11 DIAGNOSIS — D509 Iron deficiency anemia, unspecified: Secondary | ICD-10-CM | POA: Diagnosis not present

## 2016-02-11 DIAGNOSIS — D631 Anemia in chronic kidney disease: Secondary | ICD-10-CM | POA: Diagnosis not present

## 2016-02-11 DIAGNOSIS — Z79899 Other long term (current) drug therapy: Secondary | ICD-10-CM | POA: Diagnosis not present

## 2016-02-12 DIAGNOSIS — D631 Anemia in chronic kidney disease: Secondary | ICD-10-CM | POA: Diagnosis not present

## 2016-02-12 DIAGNOSIS — N186 End stage renal disease: Secondary | ICD-10-CM | POA: Diagnosis not present

## 2016-02-12 DIAGNOSIS — D509 Iron deficiency anemia, unspecified: Secondary | ICD-10-CM | POA: Diagnosis not present

## 2016-02-12 DIAGNOSIS — N2581 Secondary hyperparathyroidism of renal origin: Secondary | ICD-10-CM | POA: Diagnosis not present

## 2016-02-12 DIAGNOSIS — E440 Moderate protein-calorie malnutrition: Secondary | ICD-10-CM | POA: Diagnosis not present

## 2016-02-12 DIAGNOSIS — Z79899 Other long term (current) drug therapy: Secondary | ICD-10-CM | POA: Diagnosis not present

## 2016-02-13 DIAGNOSIS — Z79899 Other long term (current) drug therapy: Secondary | ICD-10-CM | POA: Diagnosis not present

## 2016-02-13 DIAGNOSIS — N186 End stage renal disease: Secondary | ICD-10-CM | POA: Diagnosis not present

## 2016-02-13 DIAGNOSIS — D631 Anemia in chronic kidney disease: Secondary | ICD-10-CM | POA: Diagnosis not present

## 2016-02-13 DIAGNOSIS — E440 Moderate protein-calorie malnutrition: Secondary | ICD-10-CM | POA: Diagnosis not present

## 2016-02-13 DIAGNOSIS — D509 Iron deficiency anemia, unspecified: Secondary | ICD-10-CM | POA: Diagnosis not present

## 2016-02-13 DIAGNOSIS — N2581 Secondary hyperparathyroidism of renal origin: Secondary | ICD-10-CM | POA: Diagnosis not present

## 2016-02-14 DIAGNOSIS — N2581 Secondary hyperparathyroidism of renal origin: Secondary | ICD-10-CM | POA: Diagnosis not present

## 2016-02-14 DIAGNOSIS — Z79899 Other long term (current) drug therapy: Secondary | ICD-10-CM | POA: Diagnosis not present

## 2016-02-14 DIAGNOSIS — E440 Moderate protein-calorie malnutrition: Secondary | ICD-10-CM | POA: Diagnosis not present

## 2016-02-14 DIAGNOSIS — D631 Anemia in chronic kidney disease: Secondary | ICD-10-CM | POA: Diagnosis not present

## 2016-02-14 DIAGNOSIS — N186 End stage renal disease: Secondary | ICD-10-CM | POA: Diagnosis not present

## 2016-02-14 DIAGNOSIS — D509 Iron deficiency anemia, unspecified: Secondary | ICD-10-CM | POA: Diagnosis not present

## 2016-02-15 DIAGNOSIS — Z79899 Other long term (current) drug therapy: Secondary | ICD-10-CM | POA: Diagnosis not present

## 2016-02-15 DIAGNOSIS — N186 End stage renal disease: Secondary | ICD-10-CM | POA: Diagnosis not present

## 2016-02-15 DIAGNOSIS — N2581 Secondary hyperparathyroidism of renal origin: Secondary | ICD-10-CM | POA: Diagnosis not present

## 2016-02-15 DIAGNOSIS — E440 Moderate protein-calorie malnutrition: Secondary | ICD-10-CM | POA: Diagnosis not present

## 2016-02-15 DIAGNOSIS — D509 Iron deficiency anemia, unspecified: Secondary | ICD-10-CM | POA: Diagnosis not present

## 2016-02-15 DIAGNOSIS — D631 Anemia in chronic kidney disease: Secondary | ICD-10-CM | POA: Diagnosis not present

## 2016-02-16 DIAGNOSIS — N186 End stage renal disease: Secondary | ICD-10-CM | POA: Diagnosis not present

## 2016-02-16 DIAGNOSIS — N2581 Secondary hyperparathyroidism of renal origin: Secondary | ICD-10-CM | POA: Diagnosis not present

## 2016-02-16 DIAGNOSIS — Z79899 Other long term (current) drug therapy: Secondary | ICD-10-CM | POA: Diagnosis not present

## 2016-02-16 DIAGNOSIS — D631 Anemia in chronic kidney disease: Secondary | ICD-10-CM | POA: Diagnosis not present

## 2016-02-16 DIAGNOSIS — D509 Iron deficiency anemia, unspecified: Secondary | ICD-10-CM | POA: Diagnosis not present

## 2016-02-16 DIAGNOSIS — E440 Moderate protein-calorie malnutrition: Secondary | ICD-10-CM | POA: Diagnosis not present

## 2016-02-17 DIAGNOSIS — E440 Moderate protein-calorie malnutrition: Secondary | ICD-10-CM | POA: Diagnosis not present

## 2016-02-17 DIAGNOSIS — N2581 Secondary hyperparathyroidism of renal origin: Secondary | ICD-10-CM | POA: Diagnosis not present

## 2016-02-17 DIAGNOSIS — D631 Anemia in chronic kidney disease: Secondary | ICD-10-CM | POA: Diagnosis not present

## 2016-02-17 DIAGNOSIS — N186 End stage renal disease: Secondary | ICD-10-CM | POA: Diagnosis not present

## 2016-02-17 DIAGNOSIS — D509 Iron deficiency anemia, unspecified: Secondary | ICD-10-CM | POA: Diagnosis not present

## 2016-02-17 DIAGNOSIS — Z79899 Other long term (current) drug therapy: Secondary | ICD-10-CM | POA: Diagnosis not present

## 2016-02-18 DIAGNOSIS — N2581 Secondary hyperparathyroidism of renal origin: Secondary | ICD-10-CM | POA: Diagnosis not present

## 2016-02-18 DIAGNOSIS — E440 Moderate protein-calorie malnutrition: Secondary | ICD-10-CM | POA: Diagnosis not present

## 2016-02-18 DIAGNOSIS — N186 End stage renal disease: Secondary | ICD-10-CM | POA: Diagnosis not present

## 2016-02-18 DIAGNOSIS — Z79899 Other long term (current) drug therapy: Secondary | ICD-10-CM | POA: Diagnosis not present

## 2016-02-18 DIAGNOSIS — D509 Iron deficiency anemia, unspecified: Secondary | ICD-10-CM | POA: Diagnosis not present

## 2016-02-18 DIAGNOSIS — D631 Anemia in chronic kidney disease: Secondary | ICD-10-CM | POA: Diagnosis not present

## 2016-02-19 DIAGNOSIS — D631 Anemia in chronic kidney disease: Secondary | ICD-10-CM | POA: Diagnosis not present

## 2016-02-19 DIAGNOSIS — D509 Iron deficiency anemia, unspecified: Secondary | ICD-10-CM | POA: Diagnosis not present

## 2016-02-19 DIAGNOSIS — N186 End stage renal disease: Secondary | ICD-10-CM | POA: Diagnosis not present

## 2016-02-19 DIAGNOSIS — Z79899 Other long term (current) drug therapy: Secondary | ICD-10-CM | POA: Diagnosis not present

## 2016-02-19 DIAGNOSIS — E440 Moderate protein-calorie malnutrition: Secondary | ICD-10-CM | POA: Diagnosis not present

## 2016-02-19 DIAGNOSIS — N2581 Secondary hyperparathyroidism of renal origin: Secondary | ICD-10-CM | POA: Diagnosis not present

## 2016-02-20 DIAGNOSIS — Z79899 Other long term (current) drug therapy: Secondary | ICD-10-CM | POA: Diagnosis not present

## 2016-02-20 DIAGNOSIS — N2581 Secondary hyperparathyroidism of renal origin: Secondary | ICD-10-CM | POA: Diagnosis not present

## 2016-02-20 DIAGNOSIS — N186 End stage renal disease: Secondary | ICD-10-CM | POA: Diagnosis not present

## 2016-02-20 DIAGNOSIS — D509 Iron deficiency anemia, unspecified: Secondary | ICD-10-CM | POA: Diagnosis not present

## 2016-02-20 DIAGNOSIS — D631 Anemia in chronic kidney disease: Secondary | ICD-10-CM | POA: Diagnosis not present

## 2016-02-20 DIAGNOSIS — E440 Moderate protein-calorie malnutrition: Secondary | ICD-10-CM | POA: Diagnosis not present

## 2016-02-21 DIAGNOSIS — Z79899 Other long term (current) drug therapy: Secondary | ICD-10-CM | POA: Diagnosis not present

## 2016-02-21 DIAGNOSIS — N2581 Secondary hyperparathyroidism of renal origin: Secondary | ICD-10-CM | POA: Diagnosis not present

## 2016-02-21 DIAGNOSIS — E440 Moderate protein-calorie malnutrition: Secondary | ICD-10-CM | POA: Diagnosis not present

## 2016-02-21 DIAGNOSIS — D631 Anemia in chronic kidney disease: Secondary | ICD-10-CM | POA: Diagnosis not present

## 2016-02-21 DIAGNOSIS — N186 End stage renal disease: Secondary | ICD-10-CM | POA: Diagnosis not present

## 2016-02-21 DIAGNOSIS — D509 Iron deficiency anemia, unspecified: Secondary | ICD-10-CM | POA: Diagnosis not present

## 2016-02-22 DIAGNOSIS — E440 Moderate protein-calorie malnutrition: Secondary | ICD-10-CM | POA: Diagnosis not present

## 2016-02-22 DIAGNOSIS — D631 Anemia in chronic kidney disease: Secondary | ICD-10-CM | POA: Diagnosis not present

## 2016-02-22 DIAGNOSIS — Z79899 Other long term (current) drug therapy: Secondary | ICD-10-CM | POA: Diagnosis not present

## 2016-02-22 DIAGNOSIS — N186 End stage renal disease: Secondary | ICD-10-CM | POA: Diagnosis not present

## 2016-02-22 DIAGNOSIS — D509 Iron deficiency anemia, unspecified: Secondary | ICD-10-CM | POA: Diagnosis not present

## 2016-02-22 DIAGNOSIS — N2581 Secondary hyperparathyroidism of renal origin: Secondary | ICD-10-CM | POA: Diagnosis not present

## 2016-02-23 DIAGNOSIS — D509 Iron deficiency anemia, unspecified: Secondary | ICD-10-CM | POA: Diagnosis not present

## 2016-02-23 DIAGNOSIS — N186 End stage renal disease: Secondary | ICD-10-CM | POA: Diagnosis not present

## 2016-02-23 DIAGNOSIS — E440 Moderate protein-calorie malnutrition: Secondary | ICD-10-CM | POA: Diagnosis not present

## 2016-02-23 DIAGNOSIS — Z79899 Other long term (current) drug therapy: Secondary | ICD-10-CM | POA: Diagnosis not present

## 2016-02-23 DIAGNOSIS — N2581 Secondary hyperparathyroidism of renal origin: Secondary | ICD-10-CM | POA: Diagnosis not present

## 2016-02-23 DIAGNOSIS — D631 Anemia in chronic kidney disease: Secondary | ICD-10-CM | POA: Diagnosis not present

## 2016-02-24 DIAGNOSIS — E440 Moderate protein-calorie malnutrition: Secondary | ICD-10-CM | POA: Diagnosis not present

## 2016-02-24 DIAGNOSIS — N2581 Secondary hyperparathyroidism of renal origin: Secondary | ICD-10-CM | POA: Diagnosis not present

## 2016-02-24 DIAGNOSIS — D631 Anemia in chronic kidney disease: Secondary | ICD-10-CM | POA: Diagnosis not present

## 2016-02-24 DIAGNOSIS — Z79899 Other long term (current) drug therapy: Secondary | ICD-10-CM | POA: Diagnosis not present

## 2016-02-24 DIAGNOSIS — D509 Iron deficiency anemia, unspecified: Secondary | ICD-10-CM | POA: Diagnosis not present

## 2016-02-24 DIAGNOSIS — N186 End stage renal disease: Secondary | ICD-10-CM | POA: Diagnosis not present

## 2016-02-25 DIAGNOSIS — D509 Iron deficiency anemia, unspecified: Secondary | ICD-10-CM | POA: Diagnosis not present

## 2016-02-25 DIAGNOSIS — Z79899 Other long term (current) drug therapy: Secondary | ICD-10-CM | POA: Diagnosis not present

## 2016-02-25 DIAGNOSIS — N2581 Secondary hyperparathyroidism of renal origin: Secondary | ICD-10-CM | POA: Diagnosis not present

## 2016-02-25 DIAGNOSIS — E440 Moderate protein-calorie malnutrition: Secondary | ICD-10-CM | POA: Diagnosis not present

## 2016-02-25 DIAGNOSIS — D631 Anemia in chronic kidney disease: Secondary | ICD-10-CM | POA: Diagnosis not present

## 2016-02-25 DIAGNOSIS — N186 End stage renal disease: Secondary | ICD-10-CM | POA: Diagnosis not present

## 2016-02-26 DIAGNOSIS — E440 Moderate protein-calorie malnutrition: Secondary | ICD-10-CM | POA: Diagnosis not present

## 2016-02-26 DIAGNOSIS — D631 Anemia in chronic kidney disease: Secondary | ICD-10-CM | POA: Diagnosis not present

## 2016-02-26 DIAGNOSIS — N186 End stage renal disease: Secondary | ICD-10-CM | POA: Diagnosis not present

## 2016-02-26 DIAGNOSIS — N2581 Secondary hyperparathyroidism of renal origin: Secondary | ICD-10-CM | POA: Diagnosis not present

## 2016-02-26 DIAGNOSIS — Z79899 Other long term (current) drug therapy: Secondary | ICD-10-CM | POA: Diagnosis not present

## 2016-02-26 DIAGNOSIS — D509 Iron deficiency anemia, unspecified: Secondary | ICD-10-CM | POA: Diagnosis not present

## 2016-02-27 DIAGNOSIS — Z79899 Other long term (current) drug therapy: Secondary | ICD-10-CM | POA: Diagnosis not present

## 2016-02-27 DIAGNOSIS — N186 End stage renal disease: Secondary | ICD-10-CM | POA: Diagnosis not present

## 2016-02-27 DIAGNOSIS — E440 Moderate protein-calorie malnutrition: Secondary | ICD-10-CM | POA: Diagnosis not present

## 2016-02-27 DIAGNOSIS — N2581 Secondary hyperparathyroidism of renal origin: Secondary | ICD-10-CM | POA: Diagnosis not present

## 2016-02-27 DIAGNOSIS — D631 Anemia in chronic kidney disease: Secondary | ICD-10-CM | POA: Diagnosis not present

## 2016-02-27 DIAGNOSIS — D509 Iron deficiency anemia, unspecified: Secondary | ICD-10-CM | POA: Diagnosis not present

## 2016-02-28 DIAGNOSIS — Z79899 Other long term (current) drug therapy: Secondary | ICD-10-CM | POA: Diagnosis not present

## 2016-02-28 DIAGNOSIS — D631 Anemia in chronic kidney disease: Secondary | ICD-10-CM | POA: Diagnosis not present

## 2016-02-28 DIAGNOSIS — N2581 Secondary hyperparathyroidism of renal origin: Secondary | ICD-10-CM | POA: Diagnosis not present

## 2016-02-28 DIAGNOSIS — E440 Moderate protein-calorie malnutrition: Secondary | ICD-10-CM | POA: Diagnosis not present

## 2016-02-28 DIAGNOSIS — D509 Iron deficiency anemia, unspecified: Secondary | ICD-10-CM | POA: Diagnosis not present

## 2016-02-28 DIAGNOSIS — N186 End stage renal disease: Secondary | ICD-10-CM | POA: Diagnosis not present

## 2016-02-29 DIAGNOSIS — D631 Anemia in chronic kidney disease: Secondary | ICD-10-CM | POA: Diagnosis not present

## 2016-02-29 DIAGNOSIS — Z79899 Other long term (current) drug therapy: Secondary | ICD-10-CM | POA: Diagnosis not present

## 2016-02-29 DIAGNOSIS — E440 Moderate protein-calorie malnutrition: Secondary | ICD-10-CM | POA: Diagnosis not present

## 2016-02-29 DIAGNOSIS — D509 Iron deficiency anemia, unspecified: Secondary | ICD-10-CM | POA: Diagnosis not present

## 2016-02-29 DIAGNOSIS — N2581 Secondary hyperparathyroidism of renal origin: Secondary | ICD-10-CM | POA: Diagnosis not present

## 2016-02-29 DIAGNOSIS — N186 End stage renal disease: Secondary | ICD-10-CM | POA: Diagnosis not present

## 2016-03-01 DIAGNOSIS — Z79899 Other long term (current) drug therapy: Secondary | ICD-10-CM | POA: Diagnosis not present

## 2016-03-01 DIAGNOSIS — N2581 Secondary hyperparathyroidism of renal origin: Secondary | ICD-10-CM | POA: Diagnosis not present

## 2016-03-01 DIAGNOSIS — D631 Anemia in chronic kidney disease: Secondary | ICD-10-CM | POA: Diagnosis not present

## 2016-03-01 DIAGNOSIS — E440 Moderate protein-calorie malnutrition: Secondary | ICD-10-CM | POA: Diagnosis not present

## 2016-03-01 DIAGNOSIS — N186 End stage renal disease: Secondary | ICD-10-CM | POA: Diagnosis not present

## 2016-03-01 DIAGNOSIS — D509 Iron deficiency anemia, unspecified: Secondary | ICD-10-CM | POA: Diagnosis not present

## 2016-03-02 DIAGNOSIS — N2581 Secondary hyperparathyroidism of renal origin: Secondary | ICD-10-CM | POA: Diagnosis not present

## 2016-03-02 DIAGNOSIS — E440 Moderate protein-calorie malnutrition: Secondary | ICD-10-CM | POA: Diagnosis not present

## 2016-03-02 DIAGNOSIS — Z79899 Other long term (current) drug therapy: Secondary | ICD-10-CM | POA: Diagnosis not present

## 2016-03-02 DIAGNOSIS — D631 Anemia in chronic kidney disease: Secondary | ICD-10-CM | POA: Diagnosis not present

## 2016-03-02 DIAGNOSIS — D509 Iron deficiency anemia, unspecified: Secondary | ICD-10-CM | POA: Diagnosis not present

## 2016-03-02 DIAGNOSIS — N186 End stage renal disease: Secondary | ICD-10-CM | POA: Diagnosis not present

## 2016-03-03 DIAGNOSIS — Z992 Dependence on renal dialysis: Secondary | ICD-10-CM | POA: Diagnosis not present

## 2016-03-03 DIAGNOSIS — E440 Moderate protein-calorie malnutrition: Secondary | ICD-10-CM | POA: Diagnosis not present

## 2016-03-03 DIAGNOSIS — D631 Anemia in chronic kidney disease: Secondary | ICD-10-CM | POA: Diagnosis not present

## 2016-03-03 DIAGNOSIS — Z79899 Other long term (current) drug therapy: Secondary | ICD-10-CM | POA: Diagnosis not present

## 2016-03-03 DIAGNOSIS — N2581 Secondary hyperparathyroidism of renal origin: Secondary | ICD-10-CM | POA: Diagnosis not present

## 2016-03-03 DIAGNOSIS — N186 End stage renal disease: Secondary | ICD-10-CM | POA: Diagnosis not present

## 2016-03-03 DIAGNOSIS — D509 Iron deficiency anemia, unspecified: Secondary | ICD-10-CM | POA: Diagnosis not present

## 2016-03-03 DIAGNOSIS — N2889 Other specified disorders of kidney and ureter: Secondary | ICD-10-CM | POA: Diagnosis not present

## 2016-03-04 DIAGNOSIS — N2581 Secondary hyperparathyroidism of renal origin: Secondary | ICD-10-CM | POA: Diagnosis not present

## 2016-03-04 DIAGNOSIS — D509 Iron deficiency anemia, unspecified: Secondary | ICD-10-CM | POA: Diagnosis not present

## 2016-03-04 DIAGNOSIS — D631 Anemia in chronic kidney disease: Secondary | ICD-10-CM | POA: Diagnosis not present

## 2016-03-04 DIAGNOSIS — N186 End stage renal disease: Secondary | ICD-10-CM | POA: Diagnosis not present

## 2016-03-04 DIAGNOSIS — N2589 Other disorders resulting from impaired renal tubular function: Secondary | ICD-10-CM | POA: Diagnosis not present

## 2016-03-05 DIAGNOSIS — N186 End stage renal disease: Secondary | ICD-10-CM | POA: Diagnosis not present

## 2016-03-05 DIAGNOSIS — D631 Anemia in chronic kidney disease: Secondary | ICD-10-CM | POA: Diagnosis not present

## 2016-03-05 DIAGNOSIS — N2581 Secondary hyperparathyroidism of renal origin: Secondary | ICD-10-CM | POA: Diagnosis not present

## 2016-03-05 DIAGNOSIS — N2589 Other disorders resulting from impaired renal tubular function: Secondary | ICD-10-CM | POA: Diagnosis not present

## 2016-03-05 DIAGNOSIS — D509 Iron deficiency anemia, unspecified: Secondary | ICD-10-CM | POA: Diagnosis not present

## 2016-03-06 DIAGNOSIS — D509 Iron deficiency anemia, unspecified: Secondary | ICD-10-CM | POA: Diagnosis not present

## 2016-03-06 DIAGNOSIS — N2581 Secondary hyperparathyroidism of renal origin: Secondary | ICD-10-CM | POA: Diagnosis not present

## 2016-03-06 DIAGNOSIS — N2589 Other disorders resulting from impaired renal tubular function: Secondary | ICD-10-CM | POA: Diagnosis not present

## 2016-03-06 DIAGNOSIS — D631 Anemia in chronic kidney disease: Secondary | ICD-10-CM | POA: Diagnosis not present

## 2016-03-06 DIAGNOSIS — N186 End stage renal disease: Secondary | ICD-10-CM | POA: Diagnosis not present

## 2016-03-07 DIAGNOSIS — D509 Iron deficiency anemia, unspecified: Secondary | ICD-10-CM | POA: Diagnosis not present

## 2016-03-07 DIAGNOSIS — N2581 Secondary hyperparathyroidism of renal origin: Secondary | ICD-10-CM | POA: Diagnosis not present

## 2016-03-07 DIAGNOSIS — N186 End stage renal disease: Secondary | ICD-10-CM | POA: Diagnosis not present

## 2016-03-07 DIAGNOSIS — D631 Anemia in chronic kidney disease: Secondary | ICD-10-CM | POA: Diagnosis not present

## 2016-03-07 DIAGNOSIS — N2589 Other disorders resulting from impaired renal tubular function: Secondary | ICD-10-CM | POA: Diagnosis not present

## 2016-03-08 DIAGNOSIS — N2581 Secondary hyperparathyroidism of renal origin: Secondary | ICD-10-CM | POA: Diagnosis not present

## 2016-03-08 DIAGNOSIS — E784 Other hyperlipidemia: Secondary | ICD-10-CM | POA: Diagnosis not present

## 2016-03-08 DIAGNOSIS — R8299 Other abnormal findings in urine: Secondary | ICD-10-CM | POA: Diagnosis not present

## 2016-03-08 DIAGNOSIS — N186 End stage renal disease: Secondary | ICD-10-CM | POA: Diagnosis not present

## 2016-03-08 DIAGNOSIS — N2589 Other disorders resulting from impaired renal tubular function: Secondary | ICD-10-CM | POA: Diagnosis not present

## 2016-03-08 DIAGNOSIS — D631 Anemia in chronic kidney disease: Secondary | ICD-10-CM | POA: Diagnosis not present

## 2016-03-08 DIAGNOSIS — D509 Iron deficiency anemia, unspecified: Secondary | ICD-10-CM | POA: Diagnosis not present

## 2016-03-09 DIAGNOSIS — D509 Iron deficiency anemia, unspecified: Secondary | ICD-10-CM | POA: Diagnosis not present

## 2016-03-09 DIAGNOSIS — N2581 Secondary hyperparathyroidism of renal origin: Secondary | ICD-10-CM | POA: Diagnosis not present

## 2016-03-09 DIAGNOSIS — N2589 Other disorders resulting from impaired renal tubular function: Secondary | ICD-10-CM | POA: Diagnosis not present

## 2016-03-09 DIAGNOSIS — D631 Anemia in chronic kidney disease: Secondary | ICD-10-CM | POA: Diagnosis not present

## 2016-03-09 DIAGNOSIS — N186 End stage renal disease: Secondary | ICD-10-CM | POA: Diagnosis not present

## 2016-03-10 DIAGNOSIS — N2581 Secondary hyperparathyroidism of renal origin: Secondary | ICD-10-CM | POA: Diagnosis not present

## 2016-03-10 DIAGNOSIS — N186 End stage renal disease: Secondary | ICD-10-CM | POA: Diagnosis not present

## 2016-03-10 DIAGNOSIS — D631 Anemia in chronic kidney disease: Secondary | ICD-10-CM | POA: Diagnosis not present

## 2016-03-10 DIAGNOSIS — N2589 Other disorders resulting from impaired renal tubular function: Secondary | ICD-10-CM | POA: Diagnosis not present

## 2016-03-10 DIAGNOSIS — D509 Iron deficiency anemia, unspecified: Secondary | ICD-10-CM | POA: Diagnosis not present

## 2016-03-11 DIAGNOSIS — N2589 Other disorders resulting from impaired renal tubular function: Secondary | ICD-10-CM | POA: Diagnosis not present

## 2016-03-11 DIAGNOSIS — D509 Iron deficiency anemia, unspecified: Secondary | ICD-10-CM | POA: Diagnosis not present

## 2016-03-11 DIAGNOSIS — D631 Anemia in chronic kidney disease: Secondary | ICD-10-CM | POA: Diagnosis not present

## 2016-03-11 DIAGNOSIS — N2581 Secondary hyperparathyroidism of renal origin: Secondary | ICD-10-CM | POA: Diagnosis not present

## 2016-03-11 DIAGNOSIS — N186 End stage renal disease: Secondary | ICD-10-CM | POA: Diagnosis not present

## 2016-03-12 DIAGNOSIS — D631 Anemia in chronic kidney disease: Secondary | ICD-10-CM | POA: Diagnosis not present

## 2016-03-12 DIAGNOSIS — N2589 Other disorders resulting from impaired renal tubular function: Secondary | ICD-10-CM | POA: Diagnosis not present

## 2016-03-12 DIAGNOSIS — N2581 Secondary hyperparathyroidism of renal origin: Secondary | ICD-10-CM | POA: Diagnosis not present

## 2016-03-12 DIAGNOSIS — N186 End stage renal disease: Secondary | ICD-10-CM | POA: Diagnosis not present

## 2016-03-12 DIAGNOSIS — D509 Iron deficiency anemia, unspecified: Secondary | ICD-10-CM | POA: Diagnosis not present

## 2016-03-13 DIAGNOSIS — N2589 Other disorders resulting from impaired renal tubular function: Secondary | ICD-10-CM | POA: Diagnosis not present

## 2016-03-13 DIAGNOSIS — D631 Anemia in chronic kidney disease: Secondary | ICD-10-CM | POA: Diagnosis not present

## 2016-03-13 DIAGNOSIS — N186 End stage renal disease: Secondary | ICD-10-CM | POA: Diagnosis not present

## 2016-03-13 DIAGNOSIS — D509 Iron deficiency anemia, unspecified: Secondary | ICD-10-CM | POA: Diagnosis not present

## 2016-03-13 DIAGNOSIS — N2581 Secondary hyperparathyroidism of renal origin: Secondary | ICD-10-CM | POA: Diagnosis not present

## 2016-03-14 DIAGNOSIS — N186 End stage renal disease: Secondary | ICD-10-CM | POA: Diagnosis not present

## 2016-03-14 DIAGNOSIS — N2581 Secondary hyperparathyroidism of renal origin: Secondary | ICD-10-CM | POA: Diagnosis not present

## 2016-03-14 DIAGNOSIS — D509 Iron deficiency anemia, unspecified: Secondary | ICD-10-CM | POA: Diagnosis not present

## 2016-03-14 DIAGNOSIS — D631 Anemia in chronic kidney disease: Secondary | ICD-10-CM | POA: Diagnosis not present

## 2016-03-14 DIAGNOSIS — N2589 Other disorders resulting from impaired renal tubular function: Secondary | ICD-10-CM | POA: Diagnosis not present

## 2016-03-15 DIAGNOSIS — D631 Anemia in chronic kidney disease: Secondary | ICD-10-CM | POA: Diagnosis not present

## 2016-03-15 DIAGNOSIS — N2589 Other disorders resulting from impaired renal tubular function: Secondary | ICD-10-CM | POA: Diagnosis not present

## 2016-03-15 DIAGNOSIS — N2581 Secondary hyperparathyroidism of renal origin: Secondary | ICD-10-CM | POA: Diagnosis not present

## 2016-03-15 DIAGNOSIS — D509 Iron deficiency anemia, unspecified: Secondary | ICD-10-CM | POA: Diagnosis not present

## 2016-03-15 DIAGNOSIS — N186 End stage renal disease: Secondary | ICD-10-CM | POA: Diagnosis not present

## 2016-03-16 DIAGNOSIS — N2589 Other disorders resulting from impaired renal tubular function: Secondary | ICD-10-CM | POA: Diagnosis not present

## 2016-03-16 DIAGNOSIS — N186 End stage renal disease: Secondary | ICD-10-CM | POA: Diagnosis not present

## 2016-03-16 DIAGNOSIS — D509 Iron deficiency anemia, unspecified: Secondary | ICD-10-CM | POA: Diagnosis not present

## 2016-03-16 DIAGNOSIS — N2581 Secondary hyperparathyroidism of renal origin: Secondary | ICD-10-CM | POA: Diagnosis not present

## 2016-03-16 DIAGNOSIS — D631 Anemia in chronic kidney disease: Secondary | ICD-10-CM | POA: Diagnosis not present

## 2016-03-17 DIAGNOSIS — N186 End stage renal disease: Secondary | ICD-10-CM | POA: Diagnosis not present

## 2016-03-17 DIAGNOSIS — D509 Iron deficiency anemia, unspecified: Secondary | ICD-10-CM | POA: Diagnosis not present

## 2016-03-17 DIAGNOSIS — N2589 Other disorders resulting from impaired renal tubular function: Secondary | ICD-10-CM | POA: Diagnosis not present

## 2016-03-17 DIAGNOSIS — N2581 Secondary hyperparathyroidism of renal origin: Secondary | ICD-10-CM | POA: Diagnosis not present

## 2016-03-17 DIAGNOSIS — D631 Anemia in chronic kidney disease: Secondary | ICD-10-CM | POA: Diagnosis not present

## 2016-03-18 DIAGNOSIS — N186 End stage renal disease: Secondary | ICD-10-CM | POA: Diagnosis not present

## 2016-03-18 DIAGNOSIS — N2589 Other disorders resulting from impaired renal tubular function: Secondary | ICD-10-CM | POA: Diagnosis not present

## 2016-03-18 DIAGNOSIS — N2581 Secondary hyperparathyroidism of renal origin: Secondary | ICD-10-CM | POA: Diagnosis not present

## 2016-03-18 DIAGNOSIS — D509 Iron deficiency anemia, unspecified: Secondary | ICD-10-CM | POA: Diagnosis not present

## 2016-03-18 DIAGNOSIS — D631 Anemia in chronic kidney disease: Secondary | ICD-10-CM | POA: Diagnosis not present

## 2016-03-19 DIAGNOSIS — D509 Iron deficiency anemia, unspecified: Secondary | ICD-10-CM | POA: Diagnosis not present

## 2016-03-19 DIAGNOSIS — D631 Anemia in chronic kidney disease: Secondary | ICD-10-CM | POA: Diagnosis not present

## 2016-03-19 DIAGNOSIS — N2581 Secondary hyperparathyroidism of renal origin: Secondary | ICD-10-CM | POA: Diagnosis not present

## 2016-03-19 DIAGNOSIS — N2589 Other disorders resulting from impaired renal tubular function: Secondary | ICD-10-CM | POA: Diagnosis not present

## 2016-03-19 DIAGNOSIS — N186 End stage renal disease: Secondary | ICD-10-CM | POA: Diagnosis not present

## 2016-03-20 DIAGNOSIS — N2589 Other disorders resulting from impaired renal tubular function: Secondary | ICD-10-CM | POA: Diagnosis not present

## 2016-03-20 DIAGNOSIS — D509 Iron deficiency anemia, unspecified: Secondary | ICD-10-CM | POA: Diagnosis not present

## 2016-03-20 DIAGNOSIS — N2581 Secondary hyperparathyroidism of renal origin: Secondary | ICD-10-CM | POA: Diagnosis not present

## 2016-03-20 DIAGNOSIS — N186 End stage renal disease: Secondary | ICD-10-CM | POA: Diagnosis not present

## 2016-03-20 DIAGNOSIS — D631 Anemia in chronic kidney disease: Secondary | ICD-10-CM | POA: Diagnosis not present

## 2016-03-21 DIAGNOSIS — D631 Anemia in chronic kidney disease: Secondary | ICD-10-CM | POA: Diagnosis not present

## 2016-03-21 DIAGNOSIS — N2589 Other disorders resulting from impaired renal tubular function: Secondary | ICD-10-CM | POA: Diagnosis not present

## 2016-03-21 DIAGNOSIS — N2581 Secondary hyperparathyroidism of renal origin: Secondary | ICD-10-CM | POA: Diagnosis not present

## 2016-03-21 DIAGNOSIS — D509 Iron deficiency anemia, unspecified: Secondary | ICD-10-CM | POA: Diagnosis not present

## 2016-03-21 DIAGNOSIS — N186 End stage renal disease: Secondary | ICD-10-CM | POA: Diagnosis not present

## 2016-03-22 DIAGNOSIS — N2589 Other disorders resulting from impaired renal tubular function: Secondary | ICD-10-CM | POA: Diagnosis not present

## 2016-03-22 DIAGNOSIS — N2581 Secondary hyperparathyroidism of renal origin: Secondary | ICD-10-CM | POA: Diagnosis not present

## 2016-03-22 DIAGNOSIS — D631 Anemia in chronic kidney disease: Secondary | ICD-10-CM | POA: Diagnosis not present

## 2016-03-22 DIAGNOSIS — D509 Iron deficiency anemia, unspecified: Secondary | ICD-10-CM | POA: Diagnosis not present

## 2016-03-22 DIAGNOSIS — N186 End stage renal disease: Secondary | ICD-10-CM | POA: Diagnosis not present

## 2016-03-23 DIAGNOSIS — N2581 Secondary hyperparathyroidism of renal origin: Secondary | ICD-10-CM | POA: Diagnosis not present

## 2016-03-23 DIAGNOSIS — N186 End stage renal disease: Secondary | ICD-10-CM | POA: Diagnosis not present

## 2016-03-23 DIAGNOSIS — D509 Iron deficiency anemia, unspecified: Secondary | ICD-10-CM | POA: Diagnosis not present

## 2016-03-23 DIAGNOSIS — D631 Anemia in chronic kidney disease: Secondary | ICD-10-CM | POA: Diagnosis not present

## 2016-03-23 DIAGNOSIS — N2589 Other disorders resulting from impaired renal tubular function: Secondary | ICD-10-CM | POA: Diagnosis not present

## 2016-03-24 DIAGNOSIS — N2581 Secondary hyperparathyroidism of renal origin: Secondary | ICD-10-CM | POA: Diagnosis not present

## 2016-03-24 DIAGNOSIS — N186 End stage renal disease: Secondary | ICD-10-CM | POA: Diagnosis not present

## 2016-03-24 DIAGNOSIS — D509 Iron deficiency anemia, unspecified: Secondary | ICD-10-CM | POA: Diagnosis not present

## 2016-03-24 DIAGNOSIS — N2589 Other disorders resulting from impaired renal tubular function: Secondary | ICD-10-CM | POA: Diagnosis not present

## 2016-03-24 DIAGNOSIS — D631 Anemia in chronic kidney disease: Secondary | ICD-10-CM | POA: Diagnosis not present

## 2016-03-25 DIAGNOSIS — D509 Iron deficiency anemia, unspecified: Secondary | ICD-10-CM | POA: Diagnosis not present

## 2016-03-25 DIAGNOSIS — D631 Anemia in chronic kidney disease: Secondary | ICD-10-CM | POA: Diagnosis not present

## 2016-03-25 DIAGNOSIS — N2589 Other disorders resulting from impaired renal tubular function: Secondary | ICD-10-CM | POA: Diagnosis not present

## 2016-03-25 DIAGNOSIS — N2581 Secondary hyperparathyroidism of renal origin: Secondary | ICD-10-CM | POA: Diagnosis not present

## 2016-03-25 DIAGNOSIS — N186 End stage renal disease: Secondary | ICD-10-CM | POA: Diagnosis not present

## 2016-03-26 DIAGNOSIS — D509 Iron deficiency anemia, unspecified: Secondary | ICD-10-CM | POA: Diagnosis not present

## 2016-03-26 DIAGNOSIS — N2589 Other disorders resulting from impaired renal tubular function: Secondary | ICD-10-CM | POA: Diagnosis not present

## 2016-03-26 DIAGNOSIS — D631 Anemia in chronic kidney disease: Secondary | ICD-10-CM | POA: Diagnosis not present

## 2016-03-26 DIAGNOSIS — N2581 Secondary hyperparathyroidism of renal origin: Secondary | ICD-10-CM | POA: Diagnosis not present

## 2016-03-26 DIAGNOSIS — N186 End stage renal disease: Secondary | ICD-10-CM | POA: Diagnosis not present

## 2016-03-27 DIAGNOSIS — D509 Iron deficiency anemia, unspecified: Secondary | ICD-10-CM | POA: Diagnosis not present

## 2016-03-27 DIAGNOSIS — N2581 Secondary hyperparathyroidism of renal origin: Secondary | ICD-10-CM | POA: Diagnosis not present

## 2016-03-27 DIAGNOSIS — D631 Anemia in chronic kidney disease: Secondary | ICD-10-CM | POA: Diagnosis not present

## 2016-03-27 DIAGNOSIS — N186 End stage renal disease: Secondary | ICD-10-CM | POA: Diagnosis not present

## 2016-03-27 DIAGNOSIS — N2589 Other disorders resulting from impaired renal tubular function: Secondary | ICD-10-CM | POA: Diagnosis not present

## 2016-03-28 DIAGNOSIS — D509 Iron deficiency anemia, unspecified: Secondary | ICD-10-CM | POA: Diagnosis not present

## 2016-03-28 DIAGNOSIS — N186 End stage renal disease: Secondary | ICD-10-CM | POA: Diagnosis not present

## 2016-03-28 DIAGNOSIS — N2581 Secondary hyperparathyroidism of renal origin: Secondary | ICD-10-CM | POA: Diagnosis not present

## 2016-03-28 DIAGNOSIS — N2589 Other disorders resulting from impaired renal tubular function: Secondary | ICD-10-CM | POA: Diagnosis not present

## 2016-03-28 DIAGNOSIS — D631 Anemia in chronic kidney disease: Secondary | ICD-10-CM | POA: Diagnosis not present

## 2016-03-29 DIAGNOSIS — D509 Iron deficiency anemia, unspecified: Secondary | ICD-10-CM | POA: Diagnosis not present

## 2016-03-29 DIAGNOSIS — N2581 Secondary hyperparathyroidism of renal origin: Secondary | ICD-10-CM | POA: Diagnosis not present

## 2016-03-29 DIAGNOSIS — N186 End stage renal disease: Secondary | ICD-10-CM | POA: Diagnosis not present

## 2016-03-29 DIAGNOSIS — N2589 Other disorders resulting from impaired renal tubular function: Secondary | ICD-10-CM | POA: Diagnosis not present

## 2016-03-29 DIAGNOSIS — D631 Anemia in chronic kidney disease: Secondary | ICD-10-CM | POA: Diagnosis not present

## 2016-03-30 DIAGNOSIS — N2581 Secondary hyperparathyroidism of renal origin: Secondary | ICD-10-CM | POA: Diagnosis not present

## 2016-03-30 DIAGNOSIS — N186 End stage renal disease: Secondary | ICD-10-CM | POA: Diagnosis not present

## 2016-03-30 DIAGNOSIS — D631 Anemia in chronic kidney disease: Secondary | ICD-10-CM | POA: Diagnosis not present

## 2016-03-30 DIAGNOSIS — N2589 Other disorders resulting from impaired renal tubular function: Secondary | ICD-10-CM | POA: Diagnosis not present

## 2016-03-30 DIAGNOSIS — D509 Iron deficiency anemia, unspecified: Secondary | ICD-10-CM | POA: Diagnosis not present

## 2016-03-31 DIAGNOSIS — N186 End stage renal disease: Secondary | ICD-10-CM | POA: Diagnosis not present

## 2016-03-31 DIAGNOSIS — N2581 Secondary hyperparathyroidism of renal origin: Secondary | ICD-10-CM | POA: Diagnosis not present

## 2016-03-31 DIAGNOSIS — D631 Anemia in chronic kidney disease: Secondary | ICD-10-CM | POA: Diagnosis not present

## 2016-03-31 DIAGNOSIS — D509 Iron deficiency anemia, unspecified: Secondary | ICD-10-CM | POA: Diagnosis not present

## 2016-03-31 DIAGNOSIS — N2589 Other disorders resulting from impaired renal tubular function: Secondary | ICD-10-CM | POA: Diagnosis not present

## 2016-04-01 DIAGNOSIS — N186 End stage renal disease: Secondary | ICD-10-CM | POA: Diagnosis not present

## 2016-04-01 DIAGNOSIS — D631 Anemia in chronic kidney disease: Secondary | ICD-10-CM | POA: Diagnosis not present

## 2016-04-01 DIAGNOSIS — D509 Iron deficiency anemia, unspecified: Secondary | ICD-10-CM | POA: Diagnosis not present

## 2016-04-01 DIAGNOSIS — N2581 Secondary hyperparathyroidism of renal origin: Secondary | ICD-10-CM | POA: Diagnosis not present

## 2016-04-01 DIAGNOSIS — N2589 Other disorders resulting from impaired renal tubular function: Secondary | ICD-10-CM | POA: Diagnosis not present

## 2016-04-02 DIAGNOSIS — D509 Iron deficiency anemia, unspecified: Secondary | ICD-10-CM | POA: Diagnosis not present

## 2016-04-02 DIAGNOSIS — N186 End stage renal disease: Secondary | ICD-10-CM | POA: Diagnosis not present

## 2016-04-02 DIAGNOSIS — N2581 Secondary hyperparathyroidism of renal origin: Secondary | ICD-10-CM | POA: Diagnosis not present

## 2016-04-02 DIAGNOSIS — N2589 Other disorders resulting from impaired renal tubular function: Secondary | ICD-10-CM | POA: Diagnosis not present

## 2016-04-02 DIAGNOSIS — D631 Anemia in chronic kidney disease: Secondary | ICD-10-CM | POA: Diagnosis not present

## 2016-04-03 DIAGNOSIS — D509 Iron deficiency anemia, unspecified: Secondary | ICD-10-CM | POA: Diagnosis not present

## 2016-04-03 DIAGNOSIS — N2889 Other specified disorders of kidney and ureter: Secondary | ICD-10-CM | POA: Diagnosis not present

## 2016-04-03 DIAGNOSIS — N2581 Secondary hyperparathyroidism of renal origin: Secondary | ICD-10-CM | POA: Diagnosis not present

## 2016-04-03 DIAGNOSIS — D631 Anemia in chronic kidney disease: Secondary | ICD-10-CM | POA: Diagnosis not present

## 2016-04-03 DIAGNOSIS — N2589 Other disorders resulting from impaired renal tubular function: Secondary | ICD-10-CM | POA: Diagnosis not present

## 2016-04-03 DIAGNOSIS — Z992 Dependence on renal dialysis: Secondary | ICD-10-CM | POA: Diagnosis not present

## 2016-04-03 DIAGNOSIS — N186 End stage renal disease: Secondary | ICD-10-CM | POA: Diagnosis not present

## 2016-04-04 DIAGNOSIS — D631 Anemia in chronic kidney disease: Secondary | ICD-10-CM | POA: Diagnosis not present

## 2016-04-04 DIAGNOSIS — N186 End stage renal disease: Secondary | ICD-10-CM | POA: Diagnosis not present

## 2016-04-04 DIAGNOSIS — N2581 Secondary hyperparathyroidism of renal origin: Secondary | ICD-10-CM | POA: Diagnosis not present

## 2016-04-04 DIAGNOSIS — D509 Iron deficiency anemia, unspecified: Secondary | ICD-10-CM | POA: Diagnosis not present

## 2016-04-04 DIAGNOSIS — Z79899 Other long term (current) drug therapy: Secondary | ICD-10-CM | POA: Diagnosis not present

## 2016-04-04 DIAGNOSIS — E440 Moderate protein-calorie malnutrition: Secondary | ICD-10-CM | POA: Diagnosis not present

## 2016-04-05 DIAGNOSIS — D631 Anemia in chronic kidney disease: Secondary | ICD-10-CM | POA: Diagnosis not present

## 2016-04-05 DIAGNOSIS — N2581 Secondary hyperparathyroidism of renal origin: Secondary | ICD-10-CM | POA: Diagnosis not present

## 2016-04-05 DIAGNOSIS — D509 Iron deficiency anemia, unspecified: Secondary | ICD-10-CM | POA: Diagnosis not present

## 2016-04-05 DIAGNOSIS — E440 Moderate protein-calorie malnutrition: Secondary | ICD-10-CM | POA: Diagnosis not present

## 2016-04-05 DIAGNOSIS — N186 End stage renal disease: Secondary | ICD-10-CM | POA: Diagnosis not present

## 2016-04-05 DIAGNOSIS — Z79899 Other long term (current) drug therapy: Secondary | ICD-10-CM | POA: Diagnosis not present

## 2016-04-06 DIAGNOSIS — D631 Anemia in chronic kidney disease: Secondary | ICD-10-CM | POA: Diagnosis not present

## 2016-04-06 DIAGNOSIS — N2581 Secondary hyperparathyroidism of renal origin: Secondary | ICD-10-CM | POA: Diagnosis not present

## 2016-04-06 DIAGNOSIS — N186 End stage renal disease: Secondary | ICD-10-CM | POA: Diagnosis not present

## 2016-04-06 DIAGNOSIS — E440 Moderate protein-calorie malnutrition: Secondary | ICD-10-CM | POA: Diagnosis not present

## 2016-04-06 DIAGNOSIS — D509 Iron deficiency anemia, unspecified: Secondary | ICD-10-CM | POA: Diagnosis not present

## 2016-04-06 DIAGNOSIS — Z79899 Other long term (current) drug therapy: Secondary | ICD-10-CM | POA: Diagnosis not present

## 2016-04-07 DIAGNOSIS — N186 End stage renal disease: Secondary | ICD-10-CM | POA: Diagnosis not present

## 2016-04-07 DIAGNOSIS — D509 Iron deficiency anemia, unspecified: Secondary | ICD-10-CM | POA: Diagnosis not present

## 2016-04-07 DIAGNOSIS — D631 Anemia in chronic kidney disease: Secondary | ICD-10-CM | POA: Diagnosis not present

## 2016-04-07 DIAGNOSIS — E440 Moderate protein-calorie malnutrition: Secondary | ICD-10-CM | POA: Diagnosis not present

## 2016-04-07 DIAGNOSIS — Z79899 Other long term (current) drug therapy: Secondary | ICD-10-CM | POA: Diagnosis not present

## 2016-04-07 DIAGNOSIS — N2581 Secondary hyperparathyroidism of renal origin: Secondary | ICD-10-CM | POA: Diagnosis not present

## 2016-04-08 DIAGNOSIS — N186 End stage renal disease: Secondary | ICD-10-CM | POA: Diagnosis not present

## 2016-04-08 DIAGNOSIS — E440 Moderate protein-calorie malnutrition: Secondary | ICD-10-CM | POA: Diagnosis not present

## 2016-04-08 DIAGNOSIS — D509 Iron deficiency anemia, unspecified: Secondary | ICD-10-CM | POA: Diagnosis not present

## 2016-04-08 DIAGNOSIS — N2581 Secondary hyperparathyroidism of renal origin: Secondary | ICD-10-CM | POA: Diagnosis not present

## 2016-04-08 DIAGNOSIS — Z79899 Other long term (current) drug therapy: Secondary | ICD-10-CM | POA: Diagnosis not present

## 2016-04-08 DIAGNOSIS — D631 Anemia in chronic kidney disease: Secondary | ICD-10-CM | POA: Diagnosis not present

## 2016-04-09 DIAGNOSIS — D509 Iron deficiency anemia, unspecified: Secondary | ICD-10-CM | POA: Diagnosis not present

## 2016-04-09 DIAGNOSIS — Z79899 Other long term (current) drug therapy: Secondary | ICD-10-CM | POA: Diagnosis not present

## 2016-04-09 DIAGNOSIS — N2581 Secondary hyperparathyroidism of renal origin: Secondary | ICD-10-CM | POA: Diagnosis not present

## 2016-04-09 DIAGNOSIS — N186 End stage renal disease: Secondary | ICD-10-CM | POA: Diagnosis not present

## 2016-04-09 DIAGNOSIS — E440 Moderate protein-calorie malnutrition: Secondary | ICD-10-CM | POA: Diagnosis not present

## 2016-04-09 DIAGNOSIS — D631 Anemia in chronic kidney disease: Secondary | ICD-10-CM | POA: Diagnosis not present

## 2016-04-10 DIAGNOSIS — N2581 Secondary hyperparathyroidism of renal origin: Secondary | ICD-10-CM | POA: Diagnosis not present

## 2016-04-10 DIAGNOSIS — D509 Iron deficiency anemia, unspecified: Secondary | ICD-10-CM | POA: Diagnosis not present

## 2016-04-10 DIAGNOSIS — N186 End stage renal disease: Secondary | ICD-10-CM | POA: Diagnosis not present

## 2016-04-10 DIAGNOSIS — D631 Anemia in chronic kidney disease: Secondary | ICD-10-CM | POA: Diagnosis not present

## 2016-04-10 DIAGNOSIS — E440 Moderate protein-calorie malnutrition: Secondary | ICD-10-CM | POA: Diagnosis not present

## 2016-04-10 DIAGNOSIS — Z79899 Other long term (current) drug therapy: Secondary | ICD-10-CM | POA: Diagnosis not present

## 2016-04-10 DIAGNOSIS — R8299 Other abnormal findings in urine: Secondary | ICD-10-CM | POA: Diagnosis not present

## 2016-04-11 DIAGNOSIS — D509 Iron deficiency anemia, unspecified: Secondary | ICD-10-CM | POA: Diagnosis not present

## 2016-04-11 DIAGNOSIS — Z79899 Other long term (current) drug therapy: Secondary | ICD-10-CM | POA: Diagnosis not present

## 2016-04-11 DIAGNOSIS — N186 End stage renal disease: Secondary | ICD-10-CM | POA: Diagnosis not present

## 2016-04-11 DIAGNOSIS — D631 Anemia in chronic kidney disease: Secondary | ICD-10-CM | POA: Diagnosis not present

## 2016-04-11 DIAGNOSIS — N2581 Secondary hyperparathyroidism of renal origin: Secondary | ICD-10-CM | POA: Diagnosis not present

## 2016-04-11 DIAGNOSIS — E440 Moderate protein-calorie malnutrition: Secondary | ICD-10-CM | POA: Diagnosis not present

## 2016-04-12 DIAGNOSIS — D631 Anemia in chronic kidney disease: Secondary | ICD-10-CM | POA: Diagnosis not present

## 2016-04-12 DIAGNOSIS — E440 Moderate protein-calorie malnutrition: Secondary | ICD-10-CM | POA: Diagnosis not present

## 2016-04-12 DIAGNOSIS — N186 End stage renal disease: Secondary | ICD-10-CM | POA: Diagnosis not present

## 2016-04-12 DIAGNOSIS — D509 Iron deficiency anemia, unspecified: Secondary | ICD-10-CM | POA: Diagnosis not present

## 2016-04-12 DIAGNOSIS — Z79899 Other long term (current) drug therapy: Secondary | ICD-10-CM | POA: Diagnosis not present

## 2016-04-12 DIAGNOSIS — N2581 Secondary hyperparathyroidism of renal origin: Secondary | ICD-10-CM | POA: Diagnosis not present

## 2016-04-13 DIAGNOSIS — E440 Moderate protein-calorie malnutrition: Secondary | ICD-10-CM | POA: Diagnosis not present

## 2016-04-13 DIAGNOSIS — N2581 Secondary hyperparathyroidism of renal origin: Secondary | ICD-10-CM | POA: Diagnosis not present

## 2016-04-13 DIAGNOSIS — D631 Anemia in chronic kidney disease: Secondary | ICD-10-CM | POA: Diagnosis not present

## 2016-04-13 DIAGNOSIS — N186 End stage renal disease: Secondary | ICD-10-CM | POA: Diagnosis not present

## 2016-04-13 DIAGNOSIS — Z79899 Other long term (current) drug therapy: Secondary | ICD-10-CM | POA: Diagnosis not present

## 2016-04-13 DIAGNOSIS — D509 Iron deficiency anemia, unspecified: Secondary | ICD-10-CM | POA: Diagnosis not present

## 2016-04-14 DIAGNOSIS — N2581 Secondary hyperparathyroidism of renal origin: Secondary | ICD-10-CM | POA: Diagnosis not present

## 2016-04-14 DIAGNOSIS — D631 Anemia in chronic kidney disease: Secondary | ICD-10-CM | POA: Diagnosis not present

## 2016-04-14 DIAGNOSIS — E440 Moderate protein-calorie malnutrition: Secondary | ICD-10-CM | POA: Diagnosis not present

## 2016-04-14 DIAGNOSIS — Z79899 Other long term (current) drug therapy: Secondary | ICD-10-CM | POA: Diagnosis not present

## 2016-04-14 DIAGNOSIS — N186 End stage renal disease: Secondary | ICD-10-CM | POA: Diagnosis not present

## 2016-04-14 DIAGNOSIS — D509 Iron deficiency anemia, unspecified: Secondary | ICD-10-CM | POA: Diagnosis not present

## 2016-04-15 DIAGNOSIS — D631 Anemia in chronic kidney disease: Secondary | ICD-10-CM | POA: Diagnosis not present

## 2016-04-15 DIAGNOSIS — Z79899 Other long term (current) drug therapy: Secondary | ICD-10-CM | POA: Diagnosis not present

## 2016-04-15 DIAGNOSIS — N186 End stage renal disease: Secondary | ICD-10-CM | POA: Diagnosis not present

## 2016-04-15 DIAGNOSIS — E440 Moderate protein-calorie malnutrition: Secondary | ICD-10-CM | POA: Diagnosis not present

## 2016-04-15 DIAGNOSIS — D509 Iron deficiency anemia, unspecified: Secondary | ICD-10-CM | POA: Diagnosis not present

## 2016-04-15 DIAGNOSIS — N2581 Secondary hyperparathyroidism of renal origin: Secondary | ICD-10-CM | POA: Diagnosis not present

## 2016-04-16 DIAGNOSIS — Z79899 Other long term (current) drug therapy: Secondary | ICD-10-CM | POA: Diagnosis not present

## 2016-04-16 DIAGNOSIS — N2581 Secondary hyperparathyroidism of renal origin: Secondary | ICD-10-CM | POA: Diagnosis not present

## 2016-04-16 DIAGNOSIS — N186 End stage renal disease: Secondary | ICD-10-CM | POA: Diagnosis not present

## 2016-04-16 DIAGNOSIS — D509 Iron deficiency anemia, unspecified: Secondary | ICD-10-CM | POA: Diagnosis not present

## 2016-04-16 DIAGNOSIS — D631 Anemia in chronic kidney disease: Secondary | ICD-10-CM | POA: Diagnosis not present

## 2016-04-16 DIAGNOSIS — E440 Moderate protein-calorie malnutrition: Secondary | ICD-10-CM | POA: Diagnosis not present

## 2016-04-17 DIAGNOSIS — N2581 Secondary hyperparathyroidism of renal origin: Secondary | ICD-10-CM | POA: Diagnosis not present

## 2016-04-17 DIAGNOSIS — D509 Iron deficiency anemia, unspecified: Secondary | ICD-10-CM | POA: Diagnosis not present

## 2016-04-17 DIAGNOSIS — E440 Moderate protein-calorie malnutrition: Secondary | ICD-10-CM | POA: Diagnosis not present

## 2016-04-17 DIAGNOSIS — Z79899 Other long term (current) drug therapy: Secondary | ICD-10-CM | POA: Diagnosis not present

## 2016-04-17 DIAGNOSIS — N186 End stage renal disease: Secondary | ICD-10-CM | POA: Diagnosis not present

## 2016-04-17 DIAGNOSIS — D631 Anemia in chronic kidney disease: Secondary | ICD-10-CM | POA: Diagnosis not present

## 2016-04-18 DIAGNOSIS — E440 Moderate protein-calorie malnutrition: Secondary | ICD-10-CM | POA: Diagnosis not present

## 2016-04-18 DIAGNOSIS — N2581 Secondary hyperparathyroidism of renal origin: Secondary | ICD-10-CM | POA: Diagnosis not present

## 2016-04-18 DIAGNOSIS — D509 Iron deficiency anemia, unspecified: Secondary | ICD-10-CM | POA: Diagnosis not present

## 2016-04-18 DIAGNOSIS — Z79899 Other long term (current) drug therapy: Secondary | ICD-10-CM | POA: Diagnosis not present

## 2016-04-18 DIAGNOSIS — N186 End stage renal disease: Secondary | ICD-10-CM | POA: Diagnosis not present

## 2016-04-18 DIAGNOSIS — D631 Anemia in chronic kidney disease: Secondary | ICD-10-CM | POA: Diagnosis not present

## 2016-04-19 DIAGNOSIS — D631 Anemia in chronic kidney disease: Secondary | ICD-10-CM | POA: Diagnosis not present

## 2016-04-19 DIAGNOSIS — E440 Moderate protein-calorie malnutrition: Secondary | ICD-10-CM | POA: Diagnosis not present

## 2016-04-19 DIAGNOSIS — D509 Iron deficiency anemia, unspecified: Secondary | ICD-10-CM | POA: Diagnosis not present

## 2016-04-19 DIAGNOSIS — N186 End stage renal disease: Secondary | ICD-10-CM | POA: Diagnosis not present

## 2016-04-19 DIAGNOSIS — Z79899 Other long term (current) drug therapy: Secondary | ICD-10-CM | POA: Diagnosis not present

## 2016-04-19 DIAGNOSIS — N2581 Secondary hyperparathyroidism of renal origin: Secondary | ICD-10-CM | POA: Diagnosis not present

## 2016-04-20 DIAGNOSIS — N186 End stage renal disease: Secondary | ICD-10-CM | POA: Diagnosis not present

## 2016-04-20 DIAGNOSIS — D509 Iron deficiency anemia, unspecified: Secondary | ICD-10-CM | POA: Diagnosis not present

## 2016-04-20 DIAGNOSIS — D631 Anemia in chronic kidney disease: Secondary | ICD-10-CM | POA: Diagnosis not present

## 2016-04-20 DIAGNOSIS — E440 Moderate protein-calorie malnutrition: Secondary | ICD-10-CM | POA: Diagnosis not present

## 2016-04-20 DIAGNOSIS — N2581 Secondary hyperparathyroidism of renal origin: Secondary | ICD-10-CM | POA: Diagnosis not present

## 2016-04-20 DIAGNOSIS — Z79899 Other long term (current) drug therapy: Secondary | ICD-10-CM | POA: Diagnosis not present

## 2016-04-21 DIAGNOSIS — D631 Anemia in chronic kidney disease: Secondary | ICD-10-CM | POA: Diagnosis not present

## 2016-04-21 DIAGNOSIS — N186 End stage renal disease: Secondary | ICD-10-CM | POA: Diagnosis not present

## 2016-04-21 DIAGNOSIS — N2581 Secondary hyperparathyroidism of renal origin: Secondary | ICD-10-CM | POA: Diagnosis not present

## 2016-04-21 DIAGNOSIS — Z79899 Other long term (current) drug therapy: Secondary | ICD-10-CM | POA: Diagnosis not present

## 2016-04-21 DIAGNOSIS — D509 Iron deficiency anemia, unspecified: Secondary | ICD-10-CM | POA: Diagnosis not present

## 2016-04-21 DIAGNOSIS — E440 Moderate protein-calorie malnutrition: Secondary | ICD-10-CM | POA: Diagnosis not present

## 2016-04-22 DIAGNOSIS — Z79899 Other long term (current) drug therapy: Secondary | ICD-10-CM | POA: Diagnosis not present

## 2016-04-22 DIAGNOSIS — N2581 Secondary hyperparathyroidism of renal origin: Secondary | ICD-10-CM | POA: Diagnosis not present

## 2016-04-22 DIAGNOSIS — N186 End stage renal disease: Secondary | ICD-10-CM | POA: Diagnosis not present

## 2016-04-22 DIAGNOSIS — D509 Iron deficiency anemia, unspecified: Secondary | ICD-10-CM | POA: Diagnosis not present

## 2016-04-22 DIAGNOSIS — D631 Anemia in chronic kidney disease: Secondary | ICD-10-CM | POA: Diagnosis not present

## 2016-04-22 DIAGNOSIS — E440 Moderate protein-calorie malnutrition: Secondary | ICD-10-CM | POA: Diagnosis not present

## 2016-04-23 DIAGNOSIS — N2581 Secondary hyperparathyroidism of renal origin: Secondary | ICD-10-CM | POA: Diagnosis not present

## 2016-04-23 DIAGNOSIS — D631 Anemia in chronic kidney disease: Secondary | ICD-10-CM | POA: Diagnosis not present

## 2016-04-23 DIAGNOSIS — E440 Moderate protein-calorie malnutrition: Secondary | ICD-10-CM | POA: Diagnosis not present

## 2016-04-23 DIAGNOSIS — N186 End stage renal disease: Secondary | ICD-10-CM | POA: Diagnosis not present

## 2016-04-23 DIAGNOSIS — Z79899 Other long term (current) drug therapy: Secondary | ICD-10-CM | POA: Diagnosis not present

## 2016-04-23 DIAGNOSIS — D509 Iron deficiency anemia, unspecified: Secondary | ICD-10-CM | POA: Diagnosis not present

## 2016-04-24 DIAGNOSIS — N186 End stage renal disease: Secondary | ICD-10-CM | POA: Diagnosis not present

## 2016-04-24 DIAGNOSIS — D509 Iron deficiency anemia, unspecified: Secondary | ICD-10-CM | POA: Diagnosis not present

## 2016-04-24 DIAGNOSIS — N2581 Secondary hyperparathyroidism of renal origin: Secondary | ICD-10-CM | POA: Diagnosis not present

## 2016-04-24 DIAGNOSIS — D631 Anemia in chronic kidney disease: Secondary | ICD-10-CM | POA: Diagnosis not present

## 2016-04-24 DIAGNOSIS — Z79899 Other long term (current) drug therapy: Secondary | ICD-10-CM | POA: Diagnosis not present

## 2016-04-24 DIAGNOSIS — E440 Moderate protein-calorie malnutrition: Secondary | ICD-10-CM | POA: Diagnosis not present

## 2016-04-25 DIAGNOSIS — N186 End stage renal disease: Secondary | ICD-10-CM | POA: Diagnosis not present

## 2016-04-25 DIAGNOSIS — E440 Moderate protein-calorie malnutrition: Secondary | ICD-10-CM | POA: Diagnosis not present

## 2016-04-25 DIAGNOSIS — D509 Iron deficiency anemia, unspecified: Secondary | ICD-10-CM | POA: Diagnosis not present

## 2016-04-25 DIAGNOSIS — D631 Anemia in chronic kidney disease: Secondary | ICD-10-CM | POA: Diagnosis not present

## 2016-04-25 DIAGNOSIS — N2581 Secondary hyperparathyroidism of renal origin: Secondary | ICD-10-CM | POA: Diagnosis not present

## 2016-04-25 DIAGNOSIS — Z79899 Other long term (current) drug therapy: Secondary | ICD-10-CM | POA: Diagnosis not present

## 2016-04-26 DIAGNOSIS — N2581 Secondary hyperparathyroidism of renal origin: Secondary | ICD-10-CM | POA: Diagnosis not present

## 2016-04-26 DIAGNOSIS — D509 Iron deficiency anemia, unspecified: Secondary | ICD-10-CM | POA: Diagnosis not present

## 2016-04-26 DIAGNOSIS — E440 Moderate protein-calorie malnutrition: Secondary | ICD-10-CM | POA: Diagnosis not present

## 2016-04-26 DIAGNOSIS — D631 Anemia in chronic kidney disease: Secondary | ICD-10-CM | POA: Diagnosis not present

## 2016-04-26 DIAGNOSIS — N186 End stage renal disease: Secondary | ICD-10-CM | POA: Diagnosis not present

## 2016-04-26 DIAGNOSIS — Z79899 Other long term (current) drug therapy: Secondary | ICD-10-CM | POA: Diagnosis not present

## 2016-04-27 DIAGNOSIS — D631 Anemia in chronic kidney disease: Secondary | ICD-10-CM | POA: Diagnosis not present

## 2016-04-27 DIAGNOSIS — E440 Moderate protein-calorie malnutrition: Secondary | ICD-10-CM | POA: Diagnosis not present

## 2016-04-27 DIAGNOSIS — N186 End stage renal disease: Secondary | ICD-10-CM | POA: Diagnosis not present

## 2016-04-27 DIAGNOSIS — N2581 Secondary hyperparathyroidism of renal origin: Secondary | ICD-10-CM | POA: Diagnosis not present

## 2016-04-27 DIAGNOSIS — D509 Iron deficiency anemia, unspecified: Secondary | ICD-10-CM | POA: Diagnosis not present

## 2016-04-27 DIAGNOSIS — Z79899 Other long term (current) drug therapy: Secondary | ICD-10-CM | POA: Diagnosis not present

## 2016-04-28 DIAGNOSIS — D509 Iron deficiency anemia, unspecified: Secondary | ICD-10-CM | POA: Diagnosis not present

## 2016-04-28 DIAGNOSIS — Z79899 Other long term (current) drug therapy: Secondary | ICD-10-CM | POA: Diagnosis not present

## 2016-04-28 DIAGNOSIS — N2581 Secondary hyperparathyroidism of renal origin: Secondary | ICD-10-CM | POA: Diagnosis not present

## 2016-04-28 DIAGNOSIS — N186 End stage renal disease: Secondary | ICD-10-CM | POA: Diagnosis not present

## 2016-04-28 DIAGNOSIS — D631 Anemia in chronic kidney disease: Secondary | ICD-10-CM | POA: Diagnosis not present

## 2016-04-28 DIAGNOSIS — E440 Moderate protein-calorie malnutrition: Secondary | ICD-10-CM | POA: Diagnosis not present

## 2016-04-29 DIAGNOSIS — Z79899 Other long term (current) drug therapy: Secondary | ICD-10-CM | POA: Diagnosis not present

## 2016-04-29 DIAGNOSIS — E440 Moderate protein-calorie malnutrition: Secondary | ICD-10-CM | POA: Diagnosis not present

## 2016-04-29 DIAGNOSIS — N186 End stage renal disease: Secondary | ICD-10-CM | POA: Diagnosis not present

## 2016-04-29 DIAGNOSIS — N2581 Secondary hyperparathyroidism of renal origin: Secondary | ICD-10-CM | POA: Diagnosis not present

## 2016-04-29 DIAGNOSIS — D631 Anemia in chronic kidney disease: Secondary | ICD-10-CM | POA: Diagnosis not present

## 2016-04-29 DIAGNOSIS — D509 Iron deficiency anemia, unspecified: Secondary | ICD-10-CM | POA: Diagnosis not present

## 2016-04-30 DIAGNOSIS — Z79899 Other long term (current) drug therapy: Secondary | ICD-10-CM | POA: Diagnosis not present

## 2016-04-30 DIAGNOSIS — N2581 Secondary hyperparathyroidism of renal origin: Secondary | ICD-10-CM | POA: Diagnosis not present

## 2016-04-30 DIAGNOSIS — D509 Iron deficiency anemia, unspecified: Secondary | ICD-10-CM | POA: Diagnosis not present

## 2016-04-30 DIAGNOSIS — D631 Anemia in chronic kidney disease: Secondary | ICD-10-CM | POA: Diagnosis not present

## 2016-04-30 DIAGNOSIS — E440 Moderate protein-calorie malnutrition: Secondary | ICD-10-CM | POA: Diagnosis not present

## 2016-04-30 DIAGNOSIS — N186 End stage renal disease: Secondary | ICD-10-CM | POA: Diagnosis not present

## 2016-05-01 DIAGNOSIS — N186 End stage renal disease: Secondary | ICD-10-CM | POA: Diagnosis not present

## 2016-05-01 DIAGNOSIS — D631 Anemia in chronic kidney disease: Secondary | ICD-10-CM | POA: Diagnosis not present

## 2016-05-01 DIAGNOSIS — N2581 Secondary hyperparathyroidism of renal origin: Secondary | ICD-10-CM | POA: Diagnosis not present

## 2016-05-01 DIAGNOSIS — E440 Moderate protein-calorie malnutrition: Secondary | ICD-10-CM | POA: Diagnosis not present

## 2016-05-01 DIAGNOSIS — Z79899 Other long term (current) drug therapy: Secondary | ICD-10-CM | POA: Diagnosis not present

## 2016-05-01 DIAGNOSIS — D509 Iron deficiency anemia, unspecified: Secondary | ICD-10-CM | POA: Diagnosis not present

## 2016-05-02 DIAGNOSIS — Z79899 Other long term (current) drug therapy: Secondary | ICD-10-CM | POA: Diagnosis not present

## 2016-05-02 DIAGNOSIS — D631 Anemia in chronic kidney disease: Secondary | ICD-10-CM | POA: Diagnosis not present

## 2016-05-02 DIAGNOSIS — E440 Moderate protein-calorie malnutrition: Secondary | ICD-10-CM | POA: Diagnosis not present

## 2016-05-02 DIAGNOSIS — N2581 Secondary hyperparathyroidism of renal origin: Secondary | ICD-10-CM | POA: Diagnosis not present

## 2016-05-02 DIAGNOSIS — D509 Iron deficiency anemia, unspecified: Secondary | ICD-10-CM | POA: Diagnosis not present

## 2016-05-02 DIAGNOSIS — N186 End stage renal disease: Secondary | ICD-10-CM | POA: Diagnosis not present

## 2016-05-03 DIAGNOSIS — E440 Moderate protein-calorie malnutrition: Secondary | ICD-10-CM | POA: Diagnosis not present

## 2016-05-03 DIAGNOSIS — Z79899 Other long term (current) drug therapy: Secondary | ICD-10-CM | POA: Diagnosis not present

## 2016-05-03 DIAGNOSIS — N2581 Secondary hyperparathyroidism of renal origin: Secondary | ICD-10-CM | POA: Diagnosis not present

## 2016-05-03 DIAGNOSIS — N186 End stage renal disease: Secondary | ICD-10-CM | POA: Diagnosis not present

## 2016-05-03 DIAGNOSIS — D509 Iron deficiency anemia, unspecified: Secondary | ICD-10-CM | POA: Diagnosis not present

## 2016-05-03 DIAGNOSIS — D631 Anemia in chronic kidney disease: Secondary | ICD-10-CM | POA: Diagnosis not present

## 2016-05-04 DIAGNOSIS — D509 Iron deficiency anemia, unspecified: Secondary | ICD-10-CM | POA: Diagnosis not present

## 2016-05-04 DIAGNOSIS — N2581 Secondary hyperparathyroidism of renal origin: Secondary | ICD-10-CM | POA: Diagnosis not present

## 2016-05-04 DIAGNOSIS — N186 End stage renal disease: Secondary | ICD-10-CM | POA: Diagnosis not present

## 2016-05-04 DIAGNOSIS — N2889 Other specified disorders of kidney and ureter: Secondary | ICD-10-CM | POA: Diagnosis not present

## 2016-05-04 DIAGNOSIS — Z79899 Other long term (current) drug therapy: Secondary | ICD-10-CM | POA: Diagnosis not present

## 2016-05-04 DIAGNOSIS — Z992 Dependence on renal dialysis: Secondary | ICD-10-CM | POA: Diagnosis not present

## 2016-05-04 DIAGNOSIS — D631 Anemia in chronic kidney disease: Secondary | ICD-10-CM | POA: Diagnosis not present

## 2016-05-04 DIAGNOSIS — E440 Moderate protein-calorie malnutrition: Secondary | ICD-10-CM | POA: Diagnosis not present

## 2016-05-05 DIAGNOSIS — R8299 Other abnormal findings in urine: Secondary | ICD-10-CM | POA: Diagnosis not present

## 2016-05-05 DIAGNOSIS — Z79899 Other long term (current) drug therapy: Secondary | ICD-10-CM | POA: Diagnosis not present

## 2016-05-05 DIAGNOSIS — N186 End stage renal disease: Secondary | ICD-10-CM | POA: Diagnosis not present

## 2016-05-05 DIAGNOSIS — D509 Iron deficiency anemia, unspecified: Secondary | ICD-10-CM | POA: Diagnosis not present

## 2016-05-05 DIAGNOSIS — D631 Anemia in chronic kidney disease: Secondary | ICD-10-CM | POA: Diagnosis not present

## 2016-05-05 DIAGNOSIS — E440 Moderate protein-calorie malnutrition: Secondary | ICD-10-CM | POA: Diagnosis not present

## 2016-05-05 DIAGNOSIS — N2581 Secondary hyperparathyroidism of renal origin: Secondary | ICD-10-CM | POA: Diagnosis not present

## 2016-05-06 DIAGNOSIS — D631 Anemia in chronic kidney disease: Secondary | ICD-10-CM | POA: Diagnosis not present

## 2016-05-06 DIAGNOSIS — E440 Moderate protein-calorie malnutrition: Secondary | ICD-10-CM | POA: Diagnosis not present

## 2016-05-06 DIAGNOSIS — Z79899 Other long term (current) drug therapy: Secondary | ICD-10-CM | POA: Diagnosis not present

## 2016-05-06 DIAGNOSIS — N2581 Secondary hyperparathyroidism of renal origin: Secondary | ICD-10-CM | POA: Diagnosis not present

## 2016-05-06 DIAGNOSIS — D509 Iron deficiency anemia, unspecified: Secondary | ICD-10-CM | POA: Diagnosis not present

## 2016-05-06 DIAGNOSIS — N186 End stage renal disease: Secondary | ICD-10-CM | POA: Diagnosis not present

## 2016-05-07 DIAGNOSIS — N2581 Secondary hyperparathyroidism of renal origin: Secondary | ICD-10-CM | POA: Diagnosis not present

## 2016-05-07 DIAGNOSIS — E440 Moderate protein-calorie malnutrition: Secondary | ICD-10-CM | POA: Diagnosis not present

## 2016-05-07 DIAGNOSIS — Z79899 Other long term (current) drug therapy: Secondary | ICD-10-CM | POA: Diagnosis not present

## 2016-05-07 DIAGNOSIS — D509 Iron deficiency anemia, unspecified: Secondary | ICD-10-CM | POA: Diagnosis not present

## 2016-05-07 DIAGNOSIS — N186 End stage renal disease: Secondary | ICD-10-CM | POA: Diagnosis not present

## 2016-05-07 DIAGNOSIS — D631 Anemia in chronic kidney disease: Secondary | ICD-10-CM | POA: Diagnosis not present

## 2016-05-08 DIAGNOSIS — N186 End stage renal disease: Secondary | ICD-10-CM | POA: Diagnosis not present

## 2016-05-08 DIAGNOSIS — N2581 Secondary hyperparathyroidism of renal origin: Secondary | ICD-10-CM | POA: Diagnosis not present

## 2016-05-08 DIAGNOSIS — Z79899 Other long term (current) drug therapy: Secondary | ICD-10-CM | POA: Diagnosis not present

## 2016-05-08 DIAGNOSIS — E440 Moderate protein-calorie malnutrition: Secondary | ICD-10-CM | POA: Diagnosis not present

## 2016-05-08 DIAGNOSIS — D631 Anemia in chronic kidney disease: Secondary | ICD-10-CM | POA: Diagnosis not present

## 2016-05-08 DIAGNOSIS — D509 Iron deficiency anemia, unspecified: Secondary | ICD-10-CM | POA: Diagnosis not present

## 2016-05-09 DIAGNOSIS — N2581 Secondary hyperparathyroidism of renal origin: Secondary | ICD-10-CM | POA: Diagnosis not present

## 2016-05-09 DIAGNOSIS — D631 Anemia in chronic kidney disease: Secondary | ICD-10-CM | POA: Diagnosis not present

## 2016-05-09 DIAGNOSIS — D509 Iron deficiency anemia, unspecified: Secondary | ICD-10-CM | POA: Diagnosis not present

## 2016-05-09 DIAGNOSIS — N186 End stage renal disease: Secondary | ICD-10-CM | POA: Diagnosis not present

## 2016-05-09 DIAGNOSIS — Z79899 Other long term (current) drug therapy: Secondary | ICD-10-CM | POA: Diagnosis not present

## 2016-05-09 DIAGNOSIS — E440 Moderate protein-calorie malnutrition: Secondary | ICD-10-CM | POA: Diagnosis not present

## 2016-05-10 DIAGNOSIS — E440 Moderate protein-calorie malnutrition: Secondary | ICD-10-CM | POA: Diagnosis not present

## 2016-05-10 DIAGNOSIS — N2581 Secondary hyperparathyroidism of renal origin: Secondary | ICD-10-CM | POA: Diagnosis not present

## 2016-05-10 DIAGNOSIS — N186 End stage renal disease: Secondary | ICD-10-CM | POA: Diagnosis not present

## 2016-05-10 DIAGNOSIS — Z79899 Other long term (current) drug therapy: Secondary | ICD-10-CM | POA: Diagnosis not present

## 2016-05-10 DIAGNOSIS — D631 Anemia in chronic kidney disease: Secondary | ICD-10-CM | POA: Diagnosis not present

## 2016-05-10 DIAGNOSIS — D509 Iron deficiency anemia, unspecified: Secondary | ICD-10-CM | POA: Diagnosis not present

## 2016-05-11 DIAGNOSIS — D631 Anemia in chronic kidney disease: Secondary | ICD-10-CM | POA: Diagnosis not present

## 2016-05-11 DIAGNOSIS — N2581 Secondary hyperparathyroidism of renal origin: Secondary | ICD-10-CM | POA: Diagnosis not present

## 2016-05-11 DIAGNOSIS — D509 Iron deficiency anemia, unspecified: Secondary | ICD-10-CM | POA: Diagnosis not present

## 2016-05-11 DIAGNOSIS — Z79899 Other long term (current) drug therapy: Secondary | ICD-10-CM | POA: Diagnosis not present

## 2016-05-11 DIAGNOSIS — E440 Moderate protein-calorie malnutrition: Secondary | ICD-10-CM | POA: Diagnosis not present

## 2016-05-11 DIAGNOSIS — N186 End stage renal disease: Secondary | ICD-10-CM | POA: Diagnosis not present

## 2016-05-12 DIAGNOSIS — E440 Moderate protein-calorie malnutrition: Secondary | ICD-10-CM | POA: Diagnosis not present

## 2016-05-12 DIAGNOSIS — Z79899 Other long term (current) drug therapy: Secondary | ICD-10-CM | POA: Diagnosis not present

## 2016-05-12 DIAGNOSIS — N186 End stage renal disease: Secondary | ICD-10-CM | POA: Diagnosis not present

## 2016-05-12 DIAGNOSIS — N2581 Secondary hyperparathyroidism of renal origin: Secondary | ICD-10-CM | POA: Diagnosis not present

## 2016-05-12 DIAGNOSIS — D509 Iron deficiency anemia, unspecified: Secondary | ICD-10-CM | POA: Diagnosis not present

## 2016-05-12 DIAGNOSIS — D631 Anemia in chronic kidney disease: Secondary | ICD-10-CM | POA: Diagnosis not present

## 2016-05-13 DIAGNOSIS — E440 Moderate protein-calorie malnutrition: Secondary | ICD-10-CM | POA: Diagnosis not present

## 2016-05-13 DIAGNOSIS — Z79899 Other long term (current) drug therapy: Secondary | ICD-10-CM | POA: Diagnosis not present

## 2016-05-13 DIAGNOSIS — N186 End stage renal disease: Secondary | ICD-10-CM | POA: Diagnosis not present

## 2016-05-13 DIAGNOSIS — N2581 Secondary hyperparathyroidism of renal origin: Secondary | ICD-10-CM | POA: Diagnosis not present

## 2016-05-13 DIAGNOSIS — D509 Iron deficiency anemia, unspecified: Secondary | ICD-10-CM | POA: Diagnosis not present

## 2016-05-13 DIAGNOSIS — D631 Anemia in chronic kidney disease: Secondary | ICD-10-CM | POA: Diagnosis not present

## 2016-05-14 DIAGNOSIS — Z79899 Other long term (current) drug therapy: Secondary | ICD-10-CM | POA: Diagnosis not present

## 2016-05-14 DIAGNOSIS — N2581 Secondary hyperparathyroidism of renal origin: Secondary | ICD-10-CM | POA: Diagnosis not present

## 2016-05-14 DIAGNOSIS — N186 End stage renal disease: Secondary | ICD-10-CM | POA: Diagnosis not present

## 2016-05-14 DIAGNOSIS — D631 Anemia in chronic kidney disease: Secondary | ICD-10-CM | POA: Diagnosis not present

## 2016-05-14 DIAGNOSIS — D509 Iron deficiency anemia, unspecified: Secondary | ICD-10-CM | POA: Diagnosis not present

## 2016-05-14 DIAGNOSIS — E440 Moderate protein-calorie malnutrition: Secondary | ICD-10-CM | POA: Diagnosis not present

## 2016-05-15 DIAGNOSIS — E440 Moderate protein-calorie malnutrition: Secondary | ICD-10-CM | POA: Diagnosis not present

## 2016-05-15 DIAGNOSIS — D509 Iron deficiency anemia, unspecified: Secondary | ICD-10-CM | POA: Diagnosis not present

## 2016-05-15 DIAGNOSIS — Z79899 Other long term (current) drug therapy: Secondary | ICD-10-CM | POA: Diagnosis not present

## 2016-05-15 DIAGNOSIS — N2581 Secondary hyperparathyroidism of renal origin: Secondary | ICD-10-CM | POA: Diagnosis not present

## 2016-05-15 DIAGNOSIS — N186 End stage renal disease: Secondary | ICD-10-CM | POA: Diagnosis not present

## 2016-05-15 DIAGNOSIS — D631 Anemia in chronic kidney disease: Secondary | ICD-10-CM | POA: Diagnosis not present

## 2016-05-16 DIAGNOSIS — Z79899 Other long term (current) drug therapy: Secondary | ICD-10-CM | POA: Diagnosis not present

## 2016-05-16 DIAGNOSIS — E440 Moderate protein-calorie malnutrition: Secondary | ICD-10-CM | POA: Diagnosis not present

## 2016-05-16 DIAGNOSIS — D509 Iron deficiency anemia, unspecified: Secondary | ICD-10-CM | POA: Diagnosis not present

## 2016-05-16 DIAGNOSIS — N2581 Secondary hyperparathyroidism of renal origin: Secondary | ICD-10-CM | POA: Diagnosis not present

## 2016-05-16 DIAGNOSIS — N186 End stage renal disease: Secondary | ICD-10-CM | POA: Diagnosis not present

## 2016-05-16 DIAGNOSIS — D631 Anemia in chronic kidney disease: Secondary | ICD-10-CM | POA: Diagnosis not present

## 2016-05-17 DIAGNOSIS — D509 Iron deficiency anemia, unspecified: Secondary | ICD-10-CM | POA: Diagnosis not present

## 2016-05-17 DIAGNOSIS — E440 Moderate protein-calorie malnutrition: Secondary | ICD-10-CM | POA: Diagnosis not present

## 2016-05-17 DIAGNOSIS — D631 Anemia in chronic kidney disease: Secondary | ICD-10-CM | POA: Diagnosis not present

## 2016-05-17 DIAGNOSIS — N2581 Secondary hyperparathyroidism of renal origin: Secondary | ICD-10-CM | POA: Diagnosis not present

## 2016-05-17 DIAGNOSIS — N186 End stage renal disease: Secondary | ICD-10-CM | POA: Diagnosis not present

## 2016-05-17 DIAGNOSIS — Z79899 Other long term (current) drug therapy: Secondary | ICD-10-CM | POA: Diagnosis not present

## 2016-05-18 DIAGNOSIS — E440 Moderate protein-calorie malnutrition: Secondary | ICD-10-CM | POA: Diagnosis not present

## 2016-05-18 DIAGNOSIS — D631 Anemia in chronic kidney disease: Secondary | ICD-10-CM | POA: Diagnosis not present

## 2016-05-18 DIAGNOSIS — N186 End stage renal disease: Secondary | ICD-10-CM | POA: Diagnosis not present

## 2016-05-18 DIAGNOSIS — D509 Iron deficiency anemia, unspecified: Secondary | ICD-10-CM | POA: Diagnosis not present

## 2016-05-18 DIAGNOSIS — N2581 Secondary hyperparathyroidism of renal origin: Secondary | ICD-10-CM | POA: Diagnosis not present

## 2016-05-18 DIAGNOSIS — Z79899 Other long term (current) drug therapy: Secondary | ICD-10-CM | POA: Diagnosis not present

## 2016-05-19 DIAGNOSIS — N186 End stage renal disease: Secondary | ICD-10-CM | POA: Diagnosis not present

## 2016-05-19 DIAGNOSIS — E440 Moderate protein-calorie malnutrition: Secondary | ICD-10-CM | POA: Diagnosis not present

## 2016-05-19 DIAGNOSIS — D631 Anemia in chronic kidney disease: Secondary | ICD-10-CM | POA: Diagnosis not present

## 2016-05-19 DIAGNOSIS — D509 Iron deficiency anemia, unspecified: Secondary | ICD-10-CM | POA: Diagnosis not present

## 2016-05-19 DIAGNOSIS — Z79899 Other long term (current) drug therapy: Secondary | ICD-10-CM | POA: Diagnosis not present

## 2016-05-19 DIAGNOSIS — N2581 Secondary hyperparathyroidism of renal origin: Secondary | ICD-10-CM | POA: Diagnosis not present

## 2016-05-20 DIAGNOSIS — Z79899 Other long term (current) drug therapy: Secondary | ICD-10-CM | POA: Diagnosis not present

## 2016-05-20 DIAGNOSIS — D631 Anemia in chronic kidney disease: Secondary | ICD-10-CM | POA: Diagnosis not present

## 2016-05-20 DIAGNOSIS — E440 Moderate protein-calorie malnutrition: Secondary | ICD-10-CM | POA: Diagnosis not present

## 2016-05-20 DIAGNOSIS — N186 End stage renal disease: Secondary | ICD-10-CM | POA: Diagnosis not present

## 2016-05-20 DIAGNOSIS — N2581 Secondary hyperparathyroidism of renal origin: Secondary | ICD-10-CM | POA: Diagnosis not present

## 2016-05-20 DIAGNOSIS — D509 Iron deficiency anemia, unspecified: Secondary | ICD-10-CM | POA: Diagnosis not present

## 2016-05-21 DIAGNOSIS — Z79899 Other long term (current) drug therapy: Secondary | ICD-10-CM | POA: Diagnosis not present

## 2016-05-21 DIAGNOSIS — N186 End stage renal disease: Secondary | ICD-10-CM | POA: Diagnosis not present

## 2016-05-21 DIAGNOSIS — D631 Anemia in chronic kidney disease: Secondary | ICD-10-CM | POA: Diagnosis not present

## 2016-05-21 DIAGNOSIS — N2581 Secondary hyperparathyroidism of renal origin: Secondary | ICD-10-CM | POA: Diagnosis not present

## 2016-05-21 DIAGNOSIS — E440 Moderate protein-calorie malnutrition: Secondary | ICD-10-CM | POA: Diagnosis not present

## 2016-05-21 DIAGNOSIS — D509 Iron deficiency anemia, unspecified: Secondary | ICD-10-CM | POA: Diagnosis not present

## 2016-05-22 DIAGNOSIS — E440 Moderate protein-calorie malnutrition: Secondary | ICD-10-CM | POA: Diagnosis not present

## 2016-05-22 DIAGNOSIS — N186 End stage renal disease: Secondary | ICD-10-CM | POA: Diagnosis not present

## 2016-05-22 DIAGNOSIS — N2581 Secondary hyperparathyroidism of renal origin: Secondary | ICD-10-CM | POA: Diagnosis not present

## 2016-05-22 DIAGNOSIS — Z79899 Other long term (current) drug therapy: Secondary | ICD-10-CM | POA: Diagnosis not present

## 2016-05-22 DIAGNOSIS — D509 Iron deficiency anemia, unspecified: Secondary | ICD-10-CM | POA: Diagnosis not present

## 2016-05-22 DIAGNOSIS — D631 Anemia in chronic kidney disease: Secondary | ICD-10-CM | POA: Diagnosis not present

## 2016-05-23 DIAGNOSIS — D631 Anemia in chronic kidney disease: Secondary | ICD-10-CM | POA: Diagnosis not present

## 2016-05-23 DIAGNOSIS — D509 Iron deficiency anemia, unspecified: Secondary | ICD-10-CM | POA: Diagnosis not present

## 2016-05-23 DIAGNOSIS — Z79899 Other long term (current) drug therapy: Secondary | ICD-10-CM | POA: Diagnosis not present

## 2016-05-23 DIAGNOSIS — E440 Moderate protein-calorie malnutrition: Secondary | ICD-10-CM | POA: Diagnosis not present

## 2016-05-23 DIAGNOSIS — N186 End stage renal disease: Secondary | ICD-10-CM | POA: Diagnosis not present

## 2016-05-23 DIAGNOSIS — N2581 Secondary hyperparathyroidism of renal origin: Secondary | ICD-10-CM | POA: Diagnosis not present

## 2016-05-24 DIAGNOSIS — N2581 Secondary hyperparathyroidism of renal origin: Secondary | ICD-10-CM | POA: Diagnosis not present

## 2016-05-24 DIAGNOSIS — N186 End stage renal disease: Secondary | ICD-10-CM | POA: Diagnosis not present

## 2016-05-24 DIAGNOSIS — Z79899 Other long term (current) drug therapy: Secondary | ICD-10-CM | POA: Diagnosis not present

## 2016-05-24 DIAGNOSIS — E440 Moderate protein-calorie malnutrition: Secondary | ICD-10-CM | POA: Diagnosis not present

## 2016-05-24 DIAGNOSIS — D509 Iron deficiency anemia, unspecified: Secondary | ICD-10-CM | POA: Diagnosis not present

## 2016-05-24 DIAGNOSIS — D631 Anemia in chronic kidney disease: Secondary | ICD-10-CM | POA: Diagnosis not present

## 2016-05-25 DIAGNOSIS — Z79899 Other long term (current) drug therapy: Secondary | ICD-10-CM | POA: Diagnosis not present

## 2016-05-25 DIAGNOSIS — D631 Anemia in chronic kidney disease: Secondary | ICD-10-CM | POA: Diagnosis not present

## 2016-05-25 DIAGNOSIS — D509 Iron deficiency anemia, unspecified: Secondary | ICD-10-CM | POA: Diagnosis not present

## 2016-05-25 DIAGNOSIS — N186 End stage renal disease: Secondary | ICD-10-CM | POA: Diagnosis not present

## 2016-05-25 DIAGNOSIS — N2581 Secondary hyperparathyroidism of renal origin: Secondary | ICD-10-CM | POA: Diagnosis not present

## 2016-05-25 DIAGNOSIS — E440 Moderate protein-calorie malnutrition: Secondary | ICD-10-CM | POA: Diagnosis not present

## 2016-05-26 DIAGNOSIS — N186 End stage renal disease: Secondary | ICD-10-CM | POA: Diagnosis not present

## 2016-05-26 DIAGNOSIS — Z79899 Other long term (current) drug therapy: Secondary | ICD-10-CM | POA: Diagnosis not present

## 2016-05-26 DIAGNOSIS — D631 Anemia in chronic kidney disease: Secondary | ICD-10-CM | POA: Diagnosis not present

## 2016-05-26 DIAGNOSIS — N2581 Secondary hyperparathyroidism of renal origin: Secondary | ICD-10-CM | POA: Diagnosis not present

## 2016-05-26 DIAGNOSIS — D509 Iron deficiency anemia, unspecified: Secondary | ICD-10-CM | POA: Diagnosis not present

## 2016-05-26 DIAGNOSIS — E440 Moderate protein-calorie malnutrition: Secondary | ICD-10-CM | POA: Diagnosis not present

## 2016-05-27 DIAGNOSIS — N186 End stage renal disease: Secondary | ICD-10-CM | POA: Diagnosis not present

## 2016-05-27 DIAGNOSIS — Z79899 Other long term (current) drug therapy: Secondary | ICD-10-CM | POA: Diagnosis not present

## 2016-05-27 DIAGNOSIS — D509 Iron deficiency anemia, unspecified: Secondary | ICD-10-CM | POA: Diagnosis not present

## 2016-05-27 DIAGNOSIS — D631 Anemia in chronic kidney disease: Secondary | ICD-10-CM | POA: Diagnosis not present

## 2016-05-27 DIAGNOSIS — E440 Moderate protein-calorie malnutrition: Secondary | ICD-10-CM | POA: Diagnosis not present

## 2016-05-27 DIAGNOSIS — N2581 Secondary hyperparathyroidism of renal origin: Secondary | ICD-10-CM | POA: Diagnosis not present

## 2016-05-28 DIAGNOSIS — D631 Anemia in chronic kidney disease: Secondary | ICD-10-CM | POA: Diagnosis not present

## 2016-05-28 DIAGNOSIS — N186 End stage renal disease: Secondary | ICD-10-CM | POA: Diagnosis not present

## 2016-05-28 DIAGNOSIS — Z79899 Other long term (current) drug therapy: Secondary | ICD-10-CM | POA: Diagnosis not present

## 2016-05-28 DIAGNOSIS — D509 Iron deficiency anemia, unspecified: Secondary | ICD-10-CM | POA: Diagnosis not present

## 2016-05-28 DIAGNOSIS — N2581 Secondary hyperparathyroidism of renal origin: Secondary | ICD-10-CM | POA: Diagnosis not present

## 2016-05-28 DIAGNOSIS — E440 Moderate protein-calorie malnutrition: Secondary | ICD-10-CM | POA: Diagnosis not present

## 2016-05-29 DIAGNOSIS — Z79899 Other long term (current) drug therapy: Secondary | ICD-10-CM | POA: Diagnosis not present

## 2016-05-29 DIAGNOSIS — N186 End stage renal disease: Secondary | ICD-10-CM | POA: Diagnosis not present

## 2016-05-29 DIAGNOSIS — D509 Iron deficiency anemia, unspecified: Secondary | ICD-10-CM | POA: Diagnosis not present

## 2016-05-29 DIAGNOSIS — N2581 Secondary hyperparathyroidism of renal origin: Secondary | ICD-10-CM | POA: Diagnosis not present

## 2016-05-29 DIAGNOSIS — D631 Anemia in chronic kidney disease: Secondary | ICD-10-CM | POA: Diagnosis not present

## 2016-05-29 DIAGNOSIS — E440 Moderate protein-calorie malnutrition: Secondary | ICD-10-CM | POA: Diagnosis not present

## 2016-05-30 DIAGNOSIS — D509 Iron deficiency anemia, unspecified: Secondary | ICD-10-CM | POA: Diagnosis not present

## 2016-05-30 DIAGNOSIS — D631 Anemia in chronic kidney disease: Secondary | ICD-10-CM | POA: Diagnosis not present

## 2016-05-30 DIAGNOSIS — N2581 Secondary hyperparathyroidism of renal origin: Secondary | ICD-10-CM | POA: Diagnosis not present

## 2016-05-30 DIAGNOSIS — N186 End stage renal disease: Secondary | ICD-10-CM | POA: Diagnosis not present

## 2016-05-30 DIAGNOSIS — Z79899 Other long term (current) drug therapy: Secondary | ICD-10-CM | POA: Diagnosis not present

## 2016-05-30 DIAGNOSIS — E440 Moderate protein-calorie malnutrition: Secondary | ICD-10-CM | POA: Diagnosis not present

## 2016-05-31 DIAGNOSIS — N2581 Secondary hyperparathyroidism of renal origin: Secondary | ICD-10-CM | POA: Diagnosis not present

## 2016-05-31 DIAGNOSIS — N186 End stage renal disease: Secondary | ICD-10-CM | POA: Diagnosis not present

## 2016-05-31 DIAGNOSIS — D631 Anemia in chronic kidney disease: Secondary | ICD-10-CM | POA: Diagnosis not present

## 2016-05-31 DIAGNOSIS — E440 Moderate protein-calorie malnutrition: Secondary | ICD-10-CM | POA: Diagnosis not present

## 2016-05-31 DIAGNOSIS — D509 Iron deficiency anemia, unspecified: Secondary | ICD-10-CM | POA: Diagnosis not present

## 2016-05-31 DIAGNOSIS — Z79899 Other long term (current) drug therapy: Secondary | ICD-10-CM | POA: Diagnosis not present

## 2016-06-01 DIAGNOSIS — N186 End stage renal disease: Secondary | ICD-10-CM | POA: Diagnosis not present

## 2016-06-01 DIAGNOSIS — Z79899 Other long term (current) drug therapy: Secondary | ICD-10-CM | POA: Diagnosis not present

## 2016-06-01 DIAGNOSIS — E440 Moderate protein-calorie malnutrition: Secondary | ICD-10-CM | POA: Diagnosis not present

## 2016-06-01 DIAGNOSIS — D509 Iron deficiency anemia, unspecified: Secondary | ICD-10-CM | POA: Diagnosis not present

## 2016-06-01 DIAGNOSIS — N2581 Secondary hyperparathyroidism of renal origin: Secondary | ICD-10-CM | POA: Diagnosis not present

## 2016-06-01 DIAGNOSIS — D631 Anemia in chronic kidney disease: Secondary | ICD-10-CM | POA: Diagnosis not present

## 2016-06-02 DIAGNOSIS — D631 Anemia in chronic kidney disease: Secondary | ICD-10-CM | POA: Diagnosis not present

## 2016-06-02 DIAGNOSIS — D509 Iron deficiency anemia, unspecified: Secondary | ICD-10-CM | POA: Diagnosis not present

## 2016-06-02 DIAGNOSIS — Z79899 Other long term (current) drug therapy: Secondary | ICD-10-CM | POA: Diagnosis not present

## 2016-06-02 DIAGNOSIS — N2581 Secondary hyperparathyroidism of renal origin: Secondary | ICD-10-CM | POA: Diagnosis not present

## 2016-06-02 DIAGNOSIS — R8299 Other abnormal findings in urine: Secondary | ICD-10-CM | POA: Diagnosis not present

## 2016-06-02 DIAGNOSIS — E440 Moderate protein-calorie malnutrition: Secondary | ICD-10-CM | POA: Diagnosis not present

## 2016-06-02 DIAGNOSIS — N186 End stage renal disease: Secondary | ICD-10-CM | POA: Diagnosis not present

## 2016-06-03 DIAGNOSIS — E440 Moderate protein-calorie malnutrition: Secondary | ICD-10-CM | POA: Diagnosis not present

## 2016-06-03 DIAGNOSIS — N186 End stage renal disease: Secondary | ICD-10-CM | POA: Diagnosis not present

## 2016-06-03 DIAGNOSIS — N2581 Secondary hyperparathyroidism of renal origin: Secondary | ICD-10-CM | POA: Diagnosis not present

## 2016-06-03 DIAGNOSIS — N2889 Other specified disorders of kidney and ureter: Secondary | ICD-10-CM | POA: Diagnosis not present

## 2016-06-03 DIAGNOSIS — Z79899 Other long term (current) drug therapy: Secondary | ICD-10-CM | POA: Diagnosis not present

## 2016-06-03 DIAGNOSIS — D631 Anemia in chronic kidney disease: Secondary | ICD-10-CM | POA: Diagnosis not present

## 2016-06-03 DIAGNOSIS — D509 Iron deficiency anemia, unspecified: Secondary | ICD-10-CM | POA: Diagnosis not present

## 2016-06-03 DIAGNOSIS — Z992 Dependence on renal dialysis: Secondary | ICD-10-CM | POA: Diagnosis not present

## 2016-06-04 DIAGNOSIS — N186 End stage renal disease: Secondary | ICD-10-CM | POA: Diagnosis not present

## 2016-06-04 DIAGNOSIS — N2581 Secondary hyperparathyroidism of renal origin: Secondary | ICD-10-CM | POA: Diagnosis not present

## 2016-06-04 DIAGNOSIS — D631 Anemia in chronic kidney disease: Secondary | ICD-10-CM | POA: Diagnosis not present

## 2016-06-04 DIAGNOSIS — D509 Iron deficiency anemia, unspecified: Secondary | ICD-10-CM | POA: Diagnosis not present

## 2016-06-05 DIAGNOSIS — N2581 Secondary hyperparathyroidism of renal origin: Secondary | ICD-10-CM | POA: Diagnosis not present

## 2016-06-05 DIAGNOSIS — D509 Iron deficiency anemia, unspecified: Secondary | ICD-10-CM | POA: Diagnosis not present

## 2016-06-05 DIAGNOSIS — N186 End stage renal disease: Secondary | ICD-10-CM | POA: Diagnosis not present

## 2016-06-05 DIAGNOSIS — D631 Anemia in chronic kidney disease: Secondary | ICD-10-CM | POA: Diagnosis not present

## 2016-06-06 DIAGNOSIS — N2581 Secondary hyperparathyroidism of renal origin: Secondary | ICD-10-CM | POA: Diagnosis not present

## 2016-06-06 DIAGNOSIS — D631 Anemia in chronic kidney disease: Secondary | ICD-10-CM | POA: Diagnosis not present

## 2016-06-06 DIAGNOSIS — N186 End stage renal disease: Secondary | ICD-10-CM | POA: Diagnosis not present

## 2016-06-06 DIAGNOSIS — D509 Iron deficiency anemia, unspecified: Secondary | ICD-10-CM | POA: Diagnosis not present

## 2016-06-07 DIAGNOSIS — E784 Other hyperlipidemia: Secondary | ICD-10-CM | POA: Diagnosis not present

## 2016-06-07 DIAGNOSIS — D631 Anemia in chronic kidney disease: Secondary | ICD-10-CM | POA: Diagnosis not present

## 2016-06-07 DIAGNOSIS — N2581 Secondary hyperparathyroidism of renal origin: Secondary | ICD-10-CM | POA: Diagnosis not present

## 2016-06-07 DIAGNOSIS — R8299 Other abnormal findings in urine: Secondary | ICD-10-CM | POA: Diagnosis not present

## 2016-06-07 DIAGNOSIS — D509 Iron deficiency anemia, unspecified: Secondary | ICD-10-CM | POA: Diagnosis not present

## 2016-06-07 DIAGNOSIS — Z7682 Awaiting organ transplant status: Secondary | ICD-10-CM | POA: Diagnosis not present

## 2016-06-07 DIAGNOSIS — N186 End stage renal disease: Secondary | ICD-10-CM | POA: Diagnosis not present

## 2016-06-08 DIAGNOSIS — D631 Anemia in chronic kidney disease: Secondary | ICD-10-CM | POA: Diagnosis not present

## 2016-06-08 DIAGNOSIS — N2581 Secondary hyperparathyroidism of renal origin: Secondary | ICD-10-CM | POA: Diagnosis not present

## 2016-06-08 DIAGNOSIS — N186 End stage renal disease: Secondary | ICD-10-CM | POA: Diagnosis not present

## 2016-06-08 DIAGNOSIS — D509 Iron deficiency anemia, unspecified: Secondary | ICD-10-CM | POA: Diagnosis not present

## 2016-06-09 DIAGNOSIS — N186 End stage renal disease: Secondary | ICD-10-CM | POA: Diagnosis not present

## 2016-06-09 DIAGNOSIS — D509 Iron deficiency anemia, unspecified: Secondary | ICD-10-CM | POA: Diagnosis not present

## 2016-06-09 DIAGNOSIS — D631 Anemia in chronic kidney disease: Secondary | ICD-10-CM | POA: Diagnosis not present

## 2016-06-09 DIAGNOSIS — N2581 Secondary hyperparathyroidism of renal origin: Secondary | ICD-10-CM | POA: Diagnosis not present

## 2016-06-10 DIAGNOSIS — D631 Anemia in chronic kidney disease: Secondary | ICD-10-CM | POA: Diagnosis not present

## 2016-06-10 DIAGNOSIS — N186 End stage renal disease: Secondary | ICD-10-CM | POA: Diagnosis not present

## 2016-06-10 DIAGNOSIS — N2581 Secondary hyperparathyroidism of renal origin: Secondary | ICD-10-CM | POA: Diagnosis not present

## 2016-06-10 DIAGNOSIS — D509 Iron deficiency anemia, unspecified: Secondary | ICD-10-CM | POA: Diagnosis not present

## 2016-06-11 DIAGNOSIS — D631 Anemia in chronic kidney disease: Secondary | ICD-10-CM | POA: Diagnosis not present

## 2016-06-11 DIAGNOSIS — N186 End stage renal disease: Secondary | ICD-10-CM | POA: Diagnosis not present

## 2016-06-11 DIAGNOSIS — N2581 Secondary hyperparathyroidism of renal origin: Secondary | ICD-10-CM | POA: Diagnosis not present

## 2016-06-11 DIAGNOSIS — D509 Iron deficiency anemia, unspecified: Secondary | ICD-10-CM | POA: Diagnosis not present

## 2016-06-12 DIAGNOSIS — D509 Iron deficiency anemia, unspecified: Secondary | ICD-10-CM | POA: Diagnosis not present

## 2016-06-12 DIAGNOSIS — N2581 Secondary hyperparathyroidism of renal origin: Secondary | ICD-10-CM | POA: Diagnosis not present

## 2016-06-12 DIAGNOSIS — N186 End stage renal disease: Secondary | ICD-10-CM | POA: Diagnosis not present

## 2016-06-12 DIAGNOSIS — D631 Anemia in chronic kidney disease: Secondary | ICD-10-CM | POA: Diagnosis not present

## 2016-06-13 DIAGNOSIS — N2581 Secondary hyperparathyroidism of renal origin: Secondary | ICD-10-CM | POA: Diagnosis not present

## 2016-06-13 DIAGNOSIS — D509 Iron deficiency anemia, unspecified: Secondary | ICD-10-CM | POA: Diagnosis not present

## 2016-06-13 DIAGNOSIS — N186 End stage renal disease: Secondary | ICD-10-CM | POA: Diagnosis not present

## 2016-06-13 DIAGNOSIS — D631 Anemia in chronic kidney disease: Secondary | ICD-10-CM | POA: Diagnosis not present

## 2016-06-14 DIAGNOSIS — N2581 Secondary hyperparathyroidism of renal origin: Secondary | ICD-10-CM | POA: Diagnosis not present

## 2016-06-14 DIAGNOSIS — D509 Iron deficiency anemia, unspecified: Secondary | ICD-10-CM | POA: Diagnosis not present

## 2016-06-14 DIAGNOSIS — N186 End stage renal disease: Secondary | ICD-10-CM | POA: Diagnosis not present

## 2016-06-14 DIAGNOSIS — D631 Anemia in chronic kidney disease: Secondary | ICD-10-CM | POA: Diagnosis not present

## 2016-06-15 DIAGNOSIS — N186 End stage renal disease: Secondary | ICD-10-CM | POA: Diagnosis not present

## 2016-06-15 DIAGNOSIS — D631 Anemia in chronic kidney disease: Secondary | ICD-10-CM | POA: Diagnosis not present

## 2016-06-15 DIAGNOSIS — N2581 Secondary hyperparathyroidism of renal origin: Secondary | ICD-10-CM | POA: Diagnosis not present

## 2016-06-15 DIAGNOSIS — D509 Iron deficiency anemia, unspecified: Secondary | ICD-10-CM | POA: Diagnosis not present

## 2016-06-16 DIAGNOSIS — N186 End stage renal disease: Secondary | ICD-10-CM | POA: Diagnosis not present

## 2016-06-16 DIAGNOSIS — N2581 Secondary hyperparathyroidism of renal origin: Secondary | ICD-10-CM | POA: Diagnosis not present

## 2016-06-16 DIAGNOSIS — D631 Anemia in chronic kidney disease: Secondary | ICD-10-CM | POA: Diagnosis not present

## 2016-06-16 DIAGNOSIS — D509 Iron deficiency anemia, unspecified: Secondary | ICD-10-CM | POA: Diagnosis not present

## 2016-06-17 DIAGNOSIS — D509 Iron deficiency anemia, unspecified: Secondary | ICD-10-CM | POA: Diagnosis not present

## 2016-06-17 DIAGNOSIS — N2581 Secondary hyperparathyroidism of renal origin: Secondary | ICD-10-CM | POA: Diagnosis not present

## 2016-06-17 DIAGNOSIS — N186 End stage renal disease: Secondary | ICD-10-CM | POA: Diagnosis not present

## 2016-06-17 DIAGNOSIS — D631 Anemia in chronic kidney disease: Secondary | ICD-10-CM | POA: Diagnosis not present

## 2016-06-18 DIAGNOSIS — N2581 Secondary hyperparathyroidism of renal origin: Secondary | ICD-10-CM | POA: Diagnosis not present

## 2016-06-18 DIAGNOSIS — D631 Anemia in chronic kidney disease: Secondary | ICD-10-CM | POA: Diagnosis not present

## 2016-06-18 DIAGNOSIS — N186 End stage renal disease: Secondary | ICD-10-CM | POA: Diagnosis not present

## 2016-06-18 DIAGNOSIS — D509 Iron deficiency anemia, unspecified: Secondary | ICD-10-CM | POA: Diagnosis not present

## 2016-06-19 DIAGNOSIS — D509 Iron deficiency anemia, unspecified: Secondary | ICD-10-CM | POA: Diagnosis not present

## 2016-06-19 DIAGNOSIS — D631 Anemia in chronic kidney disease: Secondary | ICD-10-CM | POA: Diagnosis not present

## 2016-06-19 DIAGNOSIS — N186 End stage renal disease: Secondary | ICD-10-CM | POA: Diagnosis not present

## 2016-06-19 DIAGNOSIS — N2581 Secondary hyperparathyroidism of renal origin: Secondary | ICD-10-CM | POA: Diagnosis not present

## 2016-06-20 DIAGNOSIS — N186 End stage renal disease: Secondary | ICD-10-CM | POA: Diagnosis not present

## 2016-06-20 DIAGNOSIS — N2581 Secondary hyperparathyroidism of renal origin: Secondary | ICD-10-CM | POA: Diagnosis not present

## 2016-06-20 DIAGNOSIS — R8299 Other abnormal findings in urine: Secondary | ICD-10-CM | POA: Diagnosis not present

## 2016-06-20 DIAGNOSIS — D509 Iron deficiency anemia, unspecified: Secondary | ICD-10-CM | POA: Diagnosis not present

## 2016-06-20 DIAGNOSIS — D631 Anemia in chronic kidney disease: Secondary | ICD-10-CM | POA: Diagnosis not present

## 2016-06-21 DIAGNOSIS — D631 Anemia in chronic kidney disease: Secondary | ICD-10-CM | POA: Diagnosis not present

## 2016-06-21 DIAGNOSIS — N2581 Secondary hyperparathyroidism of renal origin: Secondary | ICD-10-CM | POA: Diagnosis not present

## 2016-06-21 DIAGNOSIS — N186 End stage renal disease: Secondary | ICD-10-CM | POA: Diagnosis not present

## 2016-06-21 DIAGNOSIS — D509 Iron deficiency anemia, unspecified: Secondary | ICD-10-CM | POA: Diagnosis not present

## 2016-06-22 DIAGNOSIS — N186 End stage renal disease: Secondary | ICD-10-CM | POA: Diagnosis not present

## 2016-06-22 DIAGNOSIS — N2581 Secondary hyperparathyroidism of renal origin: Secondary | ICD-10-CM | POA: Diagnosis not present

## 2016-06-22 DIAGNOSIS — D509 Iron deficiency anemia, unspecified: Secondary | ICD-10-CM | POA: Diagnosis not present

## 2016-06-22 DIAGNOSIS — D631 Anemia in chronic kidney disease: Secondary | ICD-10-CM | POA: Diagnosis not present

## 2016-06-23 DIAGNOSIS — D509 Iron deficiency anemia, unspecified: Secondary | ICD-10-CM | POA: Diagnosis not present

## 2016-06-23 DIAGNOSIS — N2581 Secondary hyperparathyroidism of renal origin: Secondary | ICD-10-CM | POA: Diagnosis not present

## 2016-06-23 DIAGNOSIS — N186 End stage renal disease: Secondary | ICD-10-CM | POA: Diagnosis not present

## 2016-06-23 DIAGNOSIS — D631 Anemia in chronic kidney disease: Secondary | ICD-10-CM | POA: Diagnosis not present

## 2016-06-24 DIAGNOSIS — D509 Iron deficiency anemia, unspecified: Secondary | ICD-10-CM | POA: Diagnosis not present

## 2016-06-24 DIAGNOSIS — D631 Anemia in chronic kidney disease: Secondary | ICD-10-CM | POA: Diagnosis not present

## 2016-06-24 DIAGNOSIS — N186 End stage renal disease: Secondary | ICD-10-CM | POA: Diagnosis not present

## 2016-06-24 DIAGNOSIS — N2581 Secondary hyperparathyroidism of renal origin: Secondary | ICD-10-CM | POA: Diagnosis not present

## 2016-06-25 DIAGNOSIS — D631 Anemia in chronic kidney disease: Secondary | ICD-10-CM | POA: Diagnosis not present

## 2016-06-25 DIAGNOSIS — N2581 Secondary hyperparathyroidism of renal origin: Secondary | ICD-10-CM | POA: Diagnosis not present

## 2016-06-25 DIAGNOSIS — N186 End stage renal disease: Secondary | ICD-10-CM | POA: Diagnosis not present

## 2016-06-25 DIAGNOSIS — D509 Iron deficiency anemia, unspecified: Secondary | ICD-10-CM | POA: Diagnosis not present

## 2016-06-26 DIAGNOSIS — N186 End stage renal disease: Secondary | ICD-10-CM | POA: Diagnosis not present

## 2016-06-26 DIAGNOSIS — N2581 Secondary hyperparathyroidism of renal origin: Secondary | ICD-10-CM | POA: Diagnosis not present

## 2016-06-26 DIAGNOSIS — D509 Iron deficiency anemia, unspecified: Secondary | ICD-10-CM | POA: Diagnosis not present

## 2016-06-26 DIAGNOSIS — D631 Anemia in chronic kidney disease: Secondary | ICD-10-CM | POA: Diagnosis not present

## 2016-06-27 DIAGNOSIS — D509 Iron deficiency anemia, unspecified: Secondary | ICD-10-CM | POA: Diagnosis not present

## 2016-06-27 DIAGNOSIS — Z1231 Encounter for screening mammogram for malignant neoplasm of breast: Secondary | ICD-10-CM | POA: Diagnosis not present

## 2016-06-27 DIAGNOSIS — Z6825 Body mass index (BMI) 25.0-25.9, adult: Secondary | ICD-10-CM | POA: Diagnosis not present

## 2016-06-27 DIAGNOSIS — R319 Hematuria, unspecified: Secondary | ICD-10-CM | POA: Diagnosis not present

## 2016-06-27 DIAGNOSIS — N2581 Secondary hyperparathyroidism of renal origin: Secondary | ICD-10-CM | POA: Diagnosis not present

## 2016-06-27 DIAGNOSIS — N959 Unspecified menopausal and perimenopausal disorder: Secondary | ICD-10-CM | POA: Diagnosis not present

## 2016-06-27 DIAGNOSIS — D631 Anemia in chronic kidney disease: Secondary | ICD-10-CM | POA: Diagnosis not present

## 2016-06-27 DIAGNOSIS — N186 End stage renal disease: Secondary | ICD-10-CM | POA: Diagnosis not present

## 2016-06-28 DIAGNOSIS — N186 End stage renal disease: Secondary | ICD-10-CM | POA: Diagnosis not present

## 2016-06-28 DIAGNOSIS — N2581 Secondary hyperparathyroidism of renal origin: Secondary | ICD-10-CM | POA: Diagnosis not present

## 2016-06-28 DIAGNOSIS — D509 Iron deficiency anemia, unspecified: Secondary | ICD-10-CM | POA: Diagnosis not present

## 2016-06-28 DIAGNOSIS — D631 Anemia in chronic kidney disease: Secondary | ICD-10-CM | POA: Diagnosis not present

## 2016-06-28 DIAGNOSIS — R310 Gross hematuria: Secondary | ICD-10-CM | POA: Diagnosis not present

## 2016-06-29 DIAGNOSIS — N2581 Secondary hyperparathyroidism of renal origin: Secondary | ICD-10-CM | POA: Diagnosis not present

## 2016-06-29 DIAGNOSIS — N186 End stage renal disease: Secondary | ICD-10-CM | POA: Diagnosis not present

## 2016-06-29 DIAGNOSIS — D509 Iron deficiency anemia, unspecified: Secondary | ICD-10-CM | POA: Diagnosis not present

## 2016-06-29 DIAGNOSIS — D631 Anemia in chronic kidney disease: Secondary | ICD-10-CM | POA: Diagnosis not present

## 2016-06-30 DIAGNOSIS — D631 Anemia in chronic kidney disease: Secondary | ICD-10-CM | POA: Diagnosis not present

## 2016-06-30 DIAGNOSIS — N2581 Secondary hyperparathyroidism of renal origin: Secondary | ICD-10-CM | POA: Diagnosis not present

## 2016-06-30 DIAGNOSIS — N186 End stage renal disease: Secondary | ICD-10-CM | POA: Diagnosis not present

## 2016-06-30 DIAGNOSIS — D509 Iron deficiency anemia, unspecified: Secondary | ICD-10-CM | POA: Diagnosis not present

## 2016-07-01 DIAGNOSIS — D509 Iron deficiency anemia, unspecified: Secondary | ICD-10-CM | POA: Diagnosis not present

## 2016-07-01 DIAGNOSIS — N186 End stage renal disease: Secondary | ICD-10-CM | POA: Diagnosis not present

## 2016-07-01 DIAGNOSIS — N2581 Secondary hyperparathyroidism of renal origin: Secondary | ICD-10-CM | POA: Diagnosis not present

## 2016-07-01 DIAGNOSIS — D631 Anemia in chronic kidney disease: Secondary | ICD-10-CM | POA: Diagnosis not present

## 2016-07-02 DIAGNOSIS — N186 End stage renal disease: Secondary | ICD-10-CM | POA: Diagnosis not present

## 2016-07-02 DIAGNOSIS — D631 Anemia in chronic kidney disease: Secondary | ICD-10-CM | POA: Diagnosis not present

## 2016-07-02 DIAGNOSIS — D509 Iron deficiency anemia, unspecified: Secondary | ICD-10-CM | POA: Diagnosis not present

## 2016-07-02 DIAGNOSIS — N2581 Secondary hyperparathyroidism of renal origin: Secondary | ICD-10-CM | POA: Diagnosis not present

## 2016-07-03 ENCOUNTER — Other Ambulatory Visit: Payer: Self-pay | Admitting: Urology

## 2016-07-03 DIAGNOSIS — D509 Iron deficiency anemia, unspecified: Secondary | ICD-10-CM | POA: Diagnosis not present

## 2016-07-03 DIAGNOSIS — N2581 Secondary hyperparathyroidism of renal origin: Secondary | ICD-10-CM | POA: Diagnosis not present

## 2016-07-03 DIAGNOSIS — D631 Anemia in chronic kidney disease: Secondary | ICD-10-CM | POA: Diagnosis not present

## 2016-07-03 DIAGNOSIS — N186 End stage renal disease: Secondary | ICD-10-CM | POA: Diagnosis not present

## 2016-07-04 DIAGNOSIS — N186 End stage renal disease: Secondary | ICD-10-CM | POA: Diagnosis not present

## 2016-07-04 DIAGNOSIS — Z992 Dependence on renal dialysis: Secondary | ICD-10-CM | POA: Diagnosis not present

## 2016-07-04 DIAGNOSIS — N200 Calculus of kidney: Secondary | ICD-10-CM | POA: Diagnosis not present

## 2016-07-04 DIAGNOSIS — N2889 Other specified disorders of kidney and ureter: Secondary | ICD-10-CM | POA: Diagnosis not present

## 2016-07-04 DIAGNOSIS — D509 Iron deficiency anemia, unspecified: Secondary | ICD-10-CM | POA: Diagnosis not present

## 2016-07-04 DIAGNOSIS — D631 Anemia in chronic kidney disease: Secondary | ICD-10-CM | POA: Diagnosis not present

## 2016-07-04 DIAGNOSIS — N2581 Secondary hyperparathyroidism of renal origin: Secondary | ICD-10-CM | POA: Diagnosis not present

## 2016-07-04 DIAGNOSIS — R310 Gross hematuria: Secondary | ICD-10-CM | POA: Diagnosis not present

## 2016-07-05 DIAGNOSIS — N186 End stage renal disease: Secondary | ICD-10-CM | POA: Diagnosis not present

## 2016-07-05 DIAGNOSIS — D509 Iron deficiency anemia, unspecified: Secondary | ICD-10-CM | POA: Diagnosis not present

## 2016-07-05 DIAGNOSIS — E440 Moderate protein-calorie malnutrition: Secondary | ICD-10-CM | POA: Diagnosis not present

## 2016-07-05 DIAGNOSIS — Z79899 Other long term (current) drug therapy: Secondary | ICD-10-CM | POA: Diagnosis not present

## 2016-07-05 DIAGNOSIS — N2581 Secondary hyperparathyroidism of renal origin: Secondary | ICD-10-CM | POA: Diagnosis not present

## 2016-07-05 DIAGNOSIS — D631 Anemia in chronic kidney disease: Secondary | ICD-10-CM | POA: Diagnosis not present

## 2016-07-06 ENCOUNTER — Encounter (HOSPITAL_BASED_OUTPATIENT_CLINIC_OR_DEPARTMENT_OTHER): Payer: Self-pay | Admitting: *Deleted

## 2016-07-06 DIAGNOSIS — E440 Moderate protein-calorie malnutrition: Secondary | ICD-10-CM | POA: Diagnosis not present

## 2016-07-06 DIAGNOSIS — D631 Anemia in chronic kidney disease: Secondary | ICD-10-CM | POA: Diagnosis not present

## 2016-07-06 DIAGNOSIS — Z79899 Other long term (current) drug therapy: Secondary | ICD-10-CM | POA: Diagnosis not present

## 2016-07-06 DIAGNOSIS — D509 Iron deficiency anemia, unspecified: Secondary | ICD-10-CM | POA: Diagnosis not present

## 2016-07-06 DIAGNOSIS — N2581 Secondary hyperparathyroidism of renal origin: Secondary | ICD-10-CM | POA: Diagnosis not present

## 2016-07-06 DIAGNOSIS — N186 End stage renal disease: Secondary | ICD-10-CM | POA: Diagnosis not present

## 2016-07-06 NOTE — Progress Notes (Addendum)
NPO AFTER MN.  ARRIVE AT 1030.  NEEDS ISTAT 8 AND EKG.  WILL TAKE SYNTHROID AM DOS W/ SIPS OF WATER.  WILL REQUEST LOV NOTE AND CARDIAC CATH REPORT FROM DR Justin Mend ([place]).     RECEIVED CARDIAC CATH REPORT FROM DR [facility] OFFICE.  REVIEWED CHART W/ DR ROSE MDA, OK TO PROCEED.

## 2016-07-07 ENCOUNTER — Encounter (HOSPITAL_BASED_OUTPATIENT_CLINIC_OR_DEPARTMENT_OTHER): Payer: Self-pay | Admitting: *Deleted

## 2016-07-07 DIAGNOSIS — R8299 Other abnormal findings in urine: Secondary | ICD-10-CM | POA: Diagnosis not present

## 2016-07-07 DIAGNOSIS — N186 End stage renal disease: Secondary | ICD-10-CM | POA: Diagnosis not present

## 2016-07-07 DIAGNOSIS — N2581 Secondary hyperparathyroidism of renal origin: Secondary | ICD-10-CM | POA: Diagnosis not present

## 2016-07-07 DIAGNOSIS — Z79899 Other long term (current) drug therapy: Secondary | ICD-10-CM | POA: Diagnosis not present

## 2016-07-07 DIAGNOSIS — D509 Iron deficiency anemia, unspecified: Secondary | ICD-10-CM | POA: Diagnosis not present

## 2016-07-07 DIAGNOSIS — E440 Moderate protein-calorie malnutrition: Secondary | ICD-10-CM | POA: Diagnosis not present

## 2016-07-07 DIAGNOSIS — D631 Anemia in chronic kidney disease: Secondary | ICD-10-CM | POA: Diagnosis not present

## 2016-07-08 DIAGNOSIS — N2581 Secondary hyperparathyroidism of renal origin: Secondary | ICD-10-CM | POA: Diagnosis not present

## 2016-07-08 DIAGNOSIS — Z79899 Other long term (current) drug therapy: Secondary | ICD-10-CM | POA: Diagnosis not present

## 2016-07-08 DIAGNOSIS — E440 Moderate protein-calorie malnutrition: Secondary | ICD-10-CM | POA: Diagnosis not present

## 2016-07-08 DIAGNOSIS — D631 Anemia in chronic kidney disease: Secondary | ICD-10-CM | POA: Diagnosis not present

## 2016-07-08 DIAGNOSIS — N186 End stage renal disease: Secondary | ICD-10-CM | POA: Diagnosis not present

## 2016-07-08 DIAGNOSIS — D509 Iron deficiency anemia, unspecified: Secondary | ICD-10-CM | POA: Diagnosis not present

## 2016-07-09 DIAGNOSIS — D631 Anemia in chronic kidney disease: Secondary | ICD-10-CM | POA: Diagnosis not present

## 2016-07-09 DIAGNOSIS — D509 Iron deficiency anemia, unspecified: Secondary | ICD-10-CM | POA: Diagnosis not present

## 2016-07-09 DIAGNOSIS — N2581 Secondary hyperparathyroidism of renal origin: Secondary | ICD-10-CM | POA: Diagnosis not present

## 2016-07-09 DIAGNOSIS — E440 Moderate protein-calorie malnutrition: Secondary | ICD-10-CM | POA: Diagnosis not present

## 2016-07-09 DIAGNOSIS — N186 End stage renal disease: Secondary | ICD-10-CM | POA: Diagnosis not present

## 2016-07-09 DIAGNOSIS — Z79899 Other long term (current) drug therapy: Secondary | ICD-10-CM | POA: Diagnosis not present

## 2016-07-10 ENCOUNTER — Ambulatory Visit (HOSPITAL_COMMUNITY)
Admission: EM | Admit: 2016-07-10 | Discharge: 2016-07-10 | Disposition: A | Discharge status: Home / Self Care | Payer: MEDICARE | Attending: Family Medicine | Admitting: Family Medicine

## 2016-07-10 ENCOUNTER — Encounter (HOSPITAL_COMMUNITY): Payer: Self-pay | Admitting: Emergency Medicine

## 2016-07-10 DIAGNOSIS — D509 Iron deficiency anemia, unspecified: Secondary | ICD-10-CM | POA: Diagnosis not present

## 2016-07-10 DIAGNOSIS — N186 End stage renal disease: Secondary | ICD-10-CM | POA: Diagnosis not present

## 2016-07-10 DIAGNOSIS — M5442 Lumbago with sciatica, left side: Secondary | ICD-10-CM | POA: Diagnosis not present

## 2016-07-10 DIAGNOSIS — Z79899 Other long term (current) drug therapy: Secondary | ICD-10-CM | POA: Diagnosis not present

## 2016-07-10 DIAGNOSIS — D631 Anemia in chronic kidney disease: Secondary | ICD-10-CM | POA: Diagnosis not present

## 2016-07-10 DIAGNOSIS — M25552 Pain in left hip: Secondary | ICD-10-CM

## 2016-07-10 DIAGNOSIS — N2581 Secondary hyperparathyroidism of renal origin: Secondary | ICD-10-CM | POA: Diagnosis not present

## 2016-07-10 DIAGNOSIS — E440 Moderate protein-calorie malnutrition: Secondary | ICD-10-CM | POA: Diagnosis not present

## 2016-07-10 MED ORDER — HYDROCODONE-ACETAMINOPHEN 5-325 MG PO TABS: 1.0000 | ORAL_TABLET | Freq: Four times a day (QID) | ORAL | 0 refills | Status: DC | PRN

## 2016-07-10 NOTE — ED Provider Notes (Signed)
fosteCSN: [id number]     Arrival date & time 07/10/16  1039 History   First MD Initiated Contact with Patient 07/10/16 1242     Chief Complaint  Patient presents with  . Hip Pain  . Leg Pain   (Consider location/radiation/quality/duration/timing/severity/associated sxs/prior Treatment) HPI Yvette Jenkins is a 60 y.o. female presenting to UC with c/o Left side lower back, Left hip and Left posterior and lateral thigh pain for about 2 days.  Pain started the morning after dialysis. Pt notes she does dialysis every night.  The night before pain started she denies any change in her routine. No recent falls or heavy lifting. She does not believe she slept poorly.  She notes she was recently on two courses of antibiotics for potential UTI as she has noticed blood in her urine. She is scheduled for cystoscopy tomorrow with her urologist to help determine cause of her hematuria.  Denies fever, chills, n/v/d.    Past Medical History:  Diagnosis Date  . Acquired renal cyst of right kidney   . Anemia associated with chronic renal failure    IV Iron infusion   . Arthritis   . Coronary artery disease    per pt cardiac cath at [facility] 10-26-2015  non-obstructive cad of one vessel  . End-stage renal disease on peritoneal dialysis [facility])    nephrologist-  dr Justin Mend [facility])  daily dialysis start 7-8pm to 7-8am-- currently on kidney transplant list at [facility]  . Gross hematuria   . History of Graves' disease   . Hypertension   . Hypothyroidism, postradioiodine therapy   . Kidney transplant candidate    on waiting list at [facility]  . Left nephrolithiasis    non-obstructive per pt   . PONV (postoperative nausea and vomiting)   . Secondary hyperparathyroidism of renal origin [facility])    Past Surgical History:  Procedure Laterality Date  . CARDIAC CATHETERIZATION  11-17-2015   [facility]- dr Marcello Moores povsic   abnormal stress test w/ ischemia (received from nephrologist dr webb office) non-obstrucitve CAD-  diffuse mid to distal LAD 60% and insignificant RCA,  stress test does not really match up with this lesion and this lesion is likely to remain insignificant  . CARDIOVASCULAR STRESS TEST  10/15/2015   Intermediate risk nuclear study w/ moderate size and intensity, basal to mid inferior wall reversible perfusion defect suggestive of ischemia/  ef 57%,  inferior and inferoseptal hypokinesis to dyskinesis  . [place]  . INSERTION OF DIALYSIS CATHETER  06-23-2015  at [facility]   Tenckhoff coiled peritoneal catheter  . TRANSTHORACIC ECHOCARDIOGRAM  10/15/2015   poor acoustic windows limit study-  ef 50% with akinesis of the basal posterior, inferior , and lateral walls,  grade 1 diastolic dysfunction/;  . WRIST ARTHROSCOPY W/ TRIANGULAR FIBROCARTILAGE REPAIR Right 03/10/2010   History reviewed. No pertinent family history. Social History  Substance Use Topics  . Smoking status: Never Smoker  . Smokeless tobacco: Never Used  . Alcohol use No   OB History    No data available     Review of Systems  Constitutional: Negative for chills and fever.  Gastrointestinal: Negative for abdominal pain, constipation, diarrhea, nausea and vomiting.  Genitourinary: Positive for hematuria. Negative for dysuria, flank pain and frequency.  Musculoskeletal: Positive for arthralgias ( Left hip), back pain (Left lower) and myalgias. Negative for neck pain and neck stiffness.  Skin: Negative for color change and rash.    Allergies  Aspirin and Tetracyclines & related  Home Medications   Prior to Admission medications   Medication Sig Start Date End Date Taking? Authorizing Provider  amLODipine (NORVASC) 10 MG tablet Take 10 mg by mouth every evening.   Yes Historical Provider, MD  Cinacalcet HCl (SENSIPAR PO) Take 1 tablet by mouth daily with lunch.   Yes Historical Provider, MD  ciprofloxacin (CIPRO) 500 MG tablet Take 500 mg by mouth 2 (two) times daily.   Yes Historical  Provider, MD  levothyroxine (SYNTHROID, LEVOTHROID) 75 MCG tablet Take 75 mcg by mouth daily before breakfast.   Yes Historical Provider, MD  Multiple Vitamins-Minerals (RENAPLEX-D) TABS Take 1 tablet by mouth daily.   Yes Historical Provider, MD  Wheat Dextrin (BENEFIBER) POWD Take by mouth daily.   Yes Historical Provider, MD  HYDROcodone-acetaminophen (NORCO/VICODIN) 5-325 MG tablet Take 1-2 tablets by mouth every 6 (six) hours as needed for moderate pain or severe pain. 07/10/16   Noland Fordyce, PA-C  sevelamer (RENAGEL) 800 MG tablet Take 800 mg by mouth 3 (three) times daily with meals.    Historical Provider, MD   Meds Ordered and Administered this Visit  Medications - No data to display  BP 142/85 (BP Location: Left Arm)   Pulse 74   Temp 98.1 F (36.7 C) (Oral)   Resp 16  No data found.   Physical Exam  Constitutional: She is oriented to person, place, and time. She appears well-developed and well-nourished. No distress.  HENT:  Head: Normocephalic and atraumatic.  Eyes: EOM are normal.  Neck: Normal range of motion.  Cardiovascular: Normal rate and regular rhythm.   Pulmonary/Chest: Effort normal and breath sounds normal. No respiratory distress. She has no wheezes. She has no rales.  Abdominal: Soft. She exhibits no distension and no mass. There is no tenderness. There is no guarding and no CVA tenderness.  Musculoskeletal: Normal range of motion. She exhibits tenderness. She exhibits no edema.  No midline spinal tenderness. Mild tenderness to Left lower lumbar muscles, Left hip, and Left posterior thigh.   Neurological: She is alert and oriented to person, place, and time.  Skin: Skin is warm and dry. Capillary refill takes less than 2 seconds. No rash noted. She is not diaphoretic. No erythema.  Psychiatric: She has a normal mood and affect. Her behavior is normal.  Nursing note and vitals reviewed.   Urgent Care Course   Clinical Course     Procedures (including  critical care time)  Labs Review Labs Reviewed - No data to display  Imaging Review No results found.    MDM   1. Acute left-sided low back pain with left-sided sciatica   2. Left hip pain    Pt c/o Left lower back and hip pain. Hx and exam c/w muscle pain with sciatica. Will treat symptomatically for pain and provide home exercises/stretches for pt to try.  Encouraged to f/u tomorrow morning as previously scheduled for her cystoscopy.  Rx: norco F/u with PCP for back pain later this week if not improving.     Noland Fordyce, PA-C 07/10/16 [phone]

## 2016-07-10 NOTE — ED Triage Notes (Addendum)
The patient presented to the [facility] with a complaint of left hip and leg pain x 3 days. The patient stated that she begin to have left leg and hip pain after finishing a dialysis treatment on Saturday. The patient denied any known injury. The patient did report being on Cipro 500 for hematuria and having an upcoming cystogram scheduled with her urologist.

## 2016-07-11 ENCOUNTER — Ambulatory Visit (HOSPITAL_BASED_OUTPATIENT_CLINIC_OR_DEPARTMENT_OTHER)
Admission: RE | Admit: 2016-07-11 | Discharge: 2016-07-11 | Disposition: A | Discharge status: Home / Self Care | Payer: MEDICARE | Source: Ambulatory Visit | Attending: Urology | Admitting: Urology

## 2016-07-11 ENCOUNTER — Encounter (HOSPITAL_BASED_OUTPATIENT_CLINIC_OR_DEPARTMENT_OTHER): Payer: Self-pay | Admitting: Anesthesiology

## 2016-07-11 ENCOUNTER — Encounter (HOSPITAL_BASED_OUTPATIENT_CLINIC_OR_DEPARTMENT_OTHER)
Admission: RE | Disposition: A | Discharge status: Home / Self Care | Payer: Self-pay | Source: Ambulatory Visit | Attending: Urology

## 2016-07-11 ENCOUNTER — Ambulatory Visit (HOSPITAL_BASED_OUTPATIENT_CLINIC_OR_DEPARTMENT_OTHER): Payer: MEDICARE | Admitting: Anesthesiology

## 2016-07-11 DIAGNOSIS — Z87442 Personal history of urinary calculi: Secondary | ICD-10-CM | POA: Insufficient documentation

## 2016-07-11 DIAGNOSIS — K5900 Constipation, unspecified: Secondary | ICD-10-CM | POA: Insufficient documentation

## 2016-07-11 DIAGNOSIS — E7800 Pure hypercholesterolemia, unspecified: Secondary | ICD-10-CM | POA: Insufficient documentation

## 2016-07-11 DIAGNOSIS — Z79899 Other long term (current) drug therapy: Secondary | ICD-10-CM | POA: Diagnosis not present

## 2016-07-11 DIAGNOSIS — M545 Low back pain: Secondary | ICD-10-CM | POA: Diagnosis not present

## 2016-07-11 DIAGNOSIS — Z8349 Family history of other endocrine, nutritional and metabolic diseases: Secondary | ICD-10-CM | POA: Insufficient documentation

## 2016-07-11 DIAGNOSIS — Z992 Dependence on renal dialysis: Secondary | ICD-10-CM | POA: Diagnosis not present

## 2016-07-11 DIAGNOSIS — R310 Gross hematuria: Secondary | ICD-10-CM | POA: Insufficient documentation

## 2016-07-11 DIAGNOSIS — I2510 Atherosclerotic heart disease of native coronary artery without angina pectoris: Secondary | ICD-10-CM | POA: Diagnosis not present

## 2016-07-11 DIAGNOSIS — Z7682 Awaiting organ transplant status: Secondary | ICD-10-CM | POA: Insufficient documentation

## 2016-07-11 DIAGNOSIS — Z833 Family history of diabetes mellitus: Secondary | ICD-10-CM | POA: Insufficient documentation

## 2016-07-11 DIAGNOSIS — Z466 Encounter for fitting and adjustment of urinary device: Secondary | ICD-10-CM | POA: Diagnosis not present

## 2016-07-11 DIAGNOSIS — Z888 Allergy status to other drugs, medicaments and biological substances status: Secondary | ICD-10-CM | POA: Insufficient documentation

## 2016-07-11 DIAGNOSIS — Z881 Allergy status to other antibiotic agents status: Secondary | ICD-10-CM | POA: Insufficient documentation

## 2016-07-11 DIAGNOSIS — D631 Anemia in chronic kidney disease: Secondary | ICD-10-CM | POA: Diagnosis not present

## 2016-07-11 DIAGNOSIS — M1990 Unspecified osteoarthritis, unspecified site: Secondary | ICD-10-CM | POA: Insufficient documentation

## 2016-07-11 DIAGNOSIS — I10 Essential (primary) hypertension: Secondary | ICD-10-CM | POA: Insufficient documentation

## 2016-07-11 DIAGNOSIS — E440 Moderate protein-calorie malnutrition: Secondary | ICD-10-CM | POA: Diagnosis not present

## 2016-07-11 DIAGNOSIS — Z832 Family history of diseases of the blood and blood-forming organs and certain disorders involving the immune mechanism: Secondary | ICD-10-CM | POA: Insufficient documentation

## 2016-07-11 DIAGNOSIS — N2581 Secondary hyperparathyroidism of renal origin: Secondary | ICD-10-CM | POA: Diagnosis not present

## 2016-07-11 DIAGNOSIS — N186 End stage renal disease: Secondary | ICD-10-CM | POA: Insufficient documentation

## 2016-07-11 DIAGNOSIS — Z886 Allergy status to analgesic agent status: Secondary | ICD-10-CM | POA: Insufficient documentation

## 2016-07-11 DIAGNOSIS — I120 Hypertensive chronic kidney disease with stage 5 chronic kidney disease or end stage renal disease: Secondary | ICD-10-CM | POA: Diagnosis not present

## 2016-07-11 DIAGNOSIS — E039 Hypothyroidism, unspecified: Secondary | ICD-10-CM | POA: Insufficient documentation

## 2016-07-11 DIAGNOSIS — D509 Iron deficiency anemia, unspecified: Secondary | ICD-10-CM | POA: Diagnosis not present

## 2016-07-11 DIAGNOSIS — N2889 Other specified disorders of kidney and ureter: Secondary | ICD-10-CM | POA: Diagnosis not present

## 2016-07-11 HISTORY — DX: Nausea with vomiting, unspecified: R11.2

## 2016-07-11 HISTORY — PX: CYSTOSCOPY WITH URETEROSCOPY: SHX5123

## 2016-07-11 HISTORY — PX: CYSTOSCOPY WITH STENT PLACEMENT: SHX5790

## 2016-07-11 HISTORY — DX: Chronic kidney disease, unspecified: N18.9

## 2016-07-11 HISTORY — DX: Postprocedural hypothyroidism: E89.0

## 2016-07-11 HISTORY — DX: Calculus of kidney: N20.0

## 2016-07-11 HISTORY — DX: Atherosclerotic heart disease of native coronary artery without angina pectoris: I25.10

## 2016-07-11 HISTORY — DX: Unspecified osteoarthritis, unspecified site: M19.90

## 2016-07-11 HISTORY — DX: Awaiting organ transplant status: Z76.82

## 2016-07-11 HISTORY — DX: Personal history of other endocrine, nutritional and metabolic disease: Z86.39

## 2016-07-11 HISTORY — DX: Cyst of kidney, acquired: N28.1

## 2016-07-11 HISTORY — PX: CYSTOSCOPY W/ RETROGRADES: SHX1426

## 2016-07-11 HISTORY — DX: Essential (primary) hypertension: I10

## 2016-07-11 HISTORY — DX: Secondary hyperparathyroidism of renal origin: N25.81

## 2016-07-11 HISTORY — DX: Other specified postprocedural states: Z98.890

## 2016-07-11 HISTORY — DX: Anemia in chronic kidney disease: D63.1

## 2016-07-11 HISTORY — PX: CYSTOSCOPY WITH BIOPSY: SHX5122

## 2016-07-11 HISTORY — DX: Gross hematuria: R31.0

## 2016-07-11 LAB — POCT I-STAT, CHEM 8
BUN: 42 mg/dL — ABNORMAL HIGH (ref 6–20)
CREATININE: 11.2 mg/dL — AB (ref 0.44–1.00)
Calcium, Ion: 1.02 mmol/L — ABNORMAL LOW (ref 1.15–1.40)
Chloride: 94 mmol/L — ABNORMAL LOW (ref 101–111)
Glucose, Bld: 90 mg/dL (ref 65–99)
HEMATOCRIT: 29 % — AB (ref 36.0–46.0)
HEMOGLOBIN: 9.9 g/dL — AB (ref 12.0–15.0)
Potassium: 4 mmol/L (ref 3.5–5.1)
SODIUM: 133 mmol/L — AB (ref 135–145)
TCO2: 27 mmol/L (ref 0–100)

## 2016-07-11 SURGERY — CYSTOSCOPY, WITH RETROGRADE PYELOGRAM
Anesthesia: General | Site: Ureter | Laterality: Left

## 2016-07-11 MED ORDER — OXYCODONE HCL 5 MG PO TABS
5.0000 mg | ORAL_TABLET | Freq: Once | ORAL | Status: AC | PRN
  Administered 2016-07-11: 5 mg via ORAL
  Filled 2016-07-11: qty 1

## 2016-07-11 MED ORDER — PROPOFOL 10 MG/ML IV BOLUS
INTRAVENOUS | Status: DC | PRN
  Administered 2016-07-11: 20 mg via INTRAVENOUS
  Administered 2016-07-11: 150 mg via INTRAVENOUS

## 2016-07-11 MED ORDER — PROPOFOL 10 MG/ML IV BOLUS
INTRAVENOUS | Status: AC
  Filled 2016-07-11: qty 20

## 2016-07-11 MED ORDER — FENTANYL CITRATE (PF) 100 MCG/2ML IJ SOLN
25.0000 ug | INTRAMUSCULAR | Status: DC | PRN
  Filled 2016-07-11: qty 1

## 2016-07-11 MED ORDER — MIDAZOLAM HCL 5 MG/5ML IJ SOLN
INTRAMUSCULAR | Status: DC | PRN
  Administered 2016-07-11 (×4): 0.5 mg via INTRAVENOUS

## 2016-07-11 MED ORDER — PROMETHAZINE HCL 25 MG/ML IJ SOLN
INTRAMUSCULAR | Status: AC
  Filled 2016-07-11: qty 1

## 2016-07-11 MED ORDER — CIPROFLOXACIN IN D5W 400 MG/200ML IV SOLN
INTRAVENOUS | Status: AC
  Filled 2016-07-11: qty 200

## 2016-07-11 MED ORDER — ONDANSETRON HCL 4 MG/2ML IJ SOLN
INTRAMUSCULAR | Status: AC
  Filled 2016-07-11: qty 2

## 2016-07-11 MED ORDER — LACTATED RINGERS IV SOLN
INTRAVENOUS | Status: DC
  Filled 2016-07-11: qty 1000

## 2016-07-11 MED ORDER — SODIUM CHLORIDE 0.9 % IV SOLN
INTRAVENOUS | Status: DC
  Administered 2016-07-11: 11:00:00 via INTRAVENOUS
  Filled 2016-07-11 (×2): qty 1000

## 2016-07-11 MED ORDER — SODIUM CHLORIDE 0.9 % IR SOLN
Status: DC | PRN
  Administered 2016-07-11: 6000 mL

## 2016-07-11 MED ORDER — MIDAZOLAM HCL 2 MG/2ML IJ SOLN
INTRAMUSCULAR | Status: AC
  Filled 2016-07-11: qty 2

## 2016-07-11 MED ORDER — DEXAMETHASONE SODIUM PHOSPHATE 10 MG/ML IJ SOLN
INTRAMUSCULAR | Status: AC
  Filled 2016-07-11: qty 1

## 2016-07-11 MED ORDER — ONDANSETRON HCL 4 MG/2ML IJ SOLN
INTRAMUSCULAR | Status: DC | PRN
  Administered 2016-07-11: 4 mg via INTRAVENOUS

## 2016-07-11 MED ORDER — CIPROFLOXACIN IN D5W 400 MG/200ML IV SOLN
400.0000 mg | INTRAVENOUS | Status: AC
  Administered 2016-07-11: 400 mg via INTRAVENOUS
  Filled 2016-07-11: qty 200

## 2016-07-11 MED ORDER — FENTANYL CITRATE (PF) 100 MCG/2ML IJ SOLN
INTRAMUSCULAR | Status: DC | PRN
  Administered 2016-07-11 (×4): 25 ug via INTRAVENOUS

## 2016-07-11 MED ORDER — FENTANYL CITRATE (PF) 100 MCG/2ML IJ SOLN
INTRAMUSCULAR | Status: AC
  Filled 2016-07-11: qty 2

## 2016-07-11 MED ORDER — LIDOCAINE 2% (20 MG/ML) 5 ML SYRINGE
INTRAMUSCULAR | Status: DC | PRN
  Administered 2016-07-11: 60 mg via INTRAVENOUS

## 2016-07-11 MED ORDER — MEPERIDINE HCL 25 MG/ML IJ SOLN
6.2500 mg | INTRAMUSCULAR | Status: DC | PRN
  Filled 2016-07-11: qty 1

## 2016-07-11 MED ORDER — PROMETHAZINE HCL 25 MG/ML IJ SOLN
6.2500 mg | INTRAMUSCULAR | Status: DC | PRN
Start: 2016-07-11 — End: 2016-07-12
  Administered 2016-07-11: 6.25 mg via INTRAVENOUS
  Filled 2016-07-11: qty 1

## 2016-07-11 MED ORDER — LIDOCAINE 2% (20 MG/ML) 5 ML SYRINGE
INTRAMUSCULAR | Status: AC
  Filled 2016-07-11: qty 5

## 2016-07-11 MED ORDER — IOHEXOL 300 MG/ML  SOLN
INTRAMUSCULAR | Status: DC | PRN
  Administered 2016-07-11: 41 mL

## 2016-07-11 MED ORDER — OXYCODONE HCL 5 MG/5ML PO SOLN
5.0000 mg | Freq: Once | ORAL | Status: AC | PRN
  Filled 2016-07-11: qty 5

## 2016-07-11 MED ORDER — OXYCODONE HCL 5 MG PO TABS
ORAL_TABLET | ORAL | Status: AC
  Filled 2016-07-11: qty 1

## 2016-07-11 MED ORDER — HYDROCODONE-ACETAMINOPHEN 5-325 MG PO TABS: 1.0000 | ORAL_TABLET | ORAL | 0 refills | Status: DC | PRN

## 2016-07-11 SURGICAL SUPPLY — 22 items
BAG DRAIN URO-CYSTO SKYTR STRL (DRAIN) ×5 IMPLANT
BAG DRN UROCATH (DRAIN) ×3
CATH URET 5FR 28IN OPEN ENDED (CATHETERS) ×5 IMPLANT
CATH URET DUAL LUMEN 6-10FR 50 (CATHETERS) IMPLANT
CLOTH BEACON ORANGE TIMEOUT ST (SAFETY) ×5 IMPLANT
FIBER LASER TRAC TIP (UROLOGICAL SUPPLIES) IMPLANT
GLOVE BIO SURGEON STRL SZ7.5 (GLOVE) ×5 IMPLANT
GLOVE BIOGEL PI IND STRL 7.5 (GLOVE) IMPLANT
GLOVE BIOGEL PI INDICATOR 7.5 (GLOVE) ×2
GOWN STRL REUS W/ TWL XL LVL3 (GOWN DISPOSABLE) ×3 IMPLANT
GOWN STRL REUS W/TWL XL LVL3 (GOWN DISPOSABLE) ×5
GUIDEWIRE STR DUAL SENSOR (WIRE) ×7 IMPLANT
IV NS IRRIG 3000ML ARTHROMATIC (IV SOLUTION) ×7 IMPLANT
KIT ROOM TURNOVER WOR (KITS) ×5 IMPLANT
MANIFOLD NEPTUNE II (INSTRUMENTS) ×5 IMPLANT
NS IRRIG 500ML POUR BTL (IV SOLUTION) IMPLANT
PACK CYSTOSCOPY (CUSTOM PROCEDURE TRAY) ×5 IMPLANT
SCRUB PCMX 4 OZ (MISCELLANEOUS) ×5 IMPLANT
STENT URET 6FRX26 CONTOUR (STENTS) ×2 IMPLANT
SYRINGE IRR TOOMEY STRL 70CC (SYRINGE) ×5 IMPLANT
TUBE CONNECTING 12'X1/4 (SUCTIONS) ×1
TUBE CONNECTING 12X1/4 (SUCTIONS) ×4 IMPLANT

## 2016-07-11 NOTE — Op Note (Signed)
Date of procedure: 07/11/16  Preoperative diagnosis:  1. Gross hematuria   Postoperative diagnosis:  1. Gross hematuria   Procedure: 1. Cystoscopy 2. Bilateral retrograde pyelograms with interpretation 3. Left ureteroscopy 4. Left renal pelvis biopsy 5. Left ureteral stent placement 6 French by 26 cm  Surgeon: Baruch Gouty, MD  Anesthesia: General  Complications: None  Intraoperative findings: Right retrograde powder was unremarkable. Left retrograde pyelogram showed an apparent mass effect lateral to the collecting system appearing to originate in the renal  parenchyma obstructing the left upper pole and distorting the left middle pole. Left ureteroscopy was unremarkable except for a few erythematous areas that were biopsied. There is no lesions concerning for TCC. The left upper pole was very difficult to navigate into. The left ureteral stent was placed with a coil in the left upper pole.  EBL: None  Specimens: Left renal pelvis biopsy 3  Drains: Left 6 French by 26 cm double-J ureteral stent  Disposition: Stable to the postanesthesia care unit  Indication for procedure: The patient is a 60 y.o. female with Gross hematuria who is on peritoneal dialysis and awaiting transplant. She also has left flank pain with a CT scan concerning for hemorrhage within the left collecting system. She presents today for further workup of her gross hematuria as she is unable to undergo a CT with contrast study.  After reviewing the management options for treatment, the patient elected to proceed with the above surgical procedure(s). We have discussed the potential benefits and risks of the procedure, side effects of the proposed treatment, the likelihood of the patient achieving the goals of the procedure, and any potential problems that might occur during the procedure or recuperation. Informed consent has been obtained.  Description of procedure: The patient was met in the preoperative area.  All risks, benefits, and indications of the procedure were described in great detail. The patient consented to the procedure. Preoperative antibiotics were given. The patient was taken to the operative theater. General anesthesia was induced per the anesthesia service. The patient was then placed in the dorsal lithotomy position and prepped and draped in the usual sterile fashion. A preoperative timeout was called.   A 21 French 30 cystoscope was inserted into the patient's bladder per urethra atraumatically. Pan cystoscopy was unremarkable. Right retrograde pyelogram was obtained which was unremarkable for filling defects retention of contrast. Left retrograde polygrams obtained with apparent mass affect distorting the collecting system as described above. A sensor wire was then placed into the left ureteral orifice up to the renal pelvis under fluoroscopy. The semirigid ureteroscope was used to investigate the left ureter which was unremarkable. A second sensor wire was placed. Over this, a flexible ureteroscope was then advanced to the level of the renal pelvis. Pan nephroscopy then took place. The left lower pole was unremarkable. The left nipple was distorted by this apparent mass seen on retrogrades. It was difficult to enter the left upper pole but this was eventually possible. There is no obvious tumors over the area of the mass effect there was erythema. This erythemaareas were biopsied with ureteroscopic biopsy forceps. This was done 3 times. Again there was no obvious tumor on systematic review of the left collecting system. A sensor wire was navigated into the left upper pole with the ureteroscope and the ureteroscope withdrawn. With the aid of the cystoscope, a 6 [place] by 26 cm double-J ureteral stent was then placed and the sensor wire removed. The curl was seen in the obstructive left  upper pole on fluoroscopy necrosis in the urinary bladder direct position. There is drainage of clear urine at this  point. The patient's bladder was drained. She was woke from anesthesia and transferred in stable condition to postanesthesia care unit.  Plan: The patient will follow-up in one week. We'll obtain a renal ultrasound that time to better visualize her renal parenchyma where failure may be  A renal mass. We may remove her left ureteral stent that time depending on how her pain is with her now unobstructed left upper pole.  Baruch Gouty, M.D.

## 2016-07-11 NOTE — Transfer of Care (Signed)
Immediate Anesthesia Transfer of Care Note  Patient: Yvette Jenkins  Procedure(s) Performed: Procedure(s) (LRB): CYSTOSCOPY WITH RETROGRADE PYELOGRAM (Bilateral) CYSTOSCOPY WITH URETEROSCOPY (Left) CYSTOSCOPY WITH STENT PLACEMENT (Left) CYSTOSCOPY WITH  LEFT RENAL PELVIS BIOPSY (Left)  Patient Location: PACU  Anesthesia Type: General  Level of Consciousness: awake, sedated, patient cooperative and responds to stimulation  Airway & Oxygen Therapy: Patient Spontanous Breathing and Patient connected to face mask oxygen  Post-op Assessment: Report given to PACU RN, Post -op Vital signs reviewed and stable and Patient moving all extremities  Post vital signs: Reviewed and stable  Complications: No apparent anesthesia complications

## 2016-07-11 NOTE — Anesthesia Postprocedure Evaluation (Signed)
Anesthesia Post Note  Patient: Yvette Jenkins  Procedure(s) Performed: Procedure(s) (LRB): CYSTOSCOPY WITH RETROGRADE PYELOGRAM (Bilateral) CYSTOSCOPY WITH URETEROSCOPY (Left) CYSTOSCOPY WITH STENT PLACEMENT (Left) CYSTOSCOPY WITH  LEFT RENAL PELVIS BIOPSY (Left)  Patient location during evaluation: PACU Anesthesia Type: General Level of consciousness: awake and alert Pain management: pain level controlled Vital Signs Assessment: post-procedure vital signs reviewed and stable Respiratory status: spontaneous breathing, nonlabored ventilation, respiratory function stable and patient connected to nasal cannula oxygen Cardiovascular status: blood pressure returned to baseline and stable Postop Assessment: no signs of nausea or vomiting Anesthetic complications: no    Last Vitals:  Vitals:   07/11/16 1315 07/11/16 1330  BP: (!) 148/86 (!) 153/90  Pulse: 78 71  Resp: 19 13  Temp:      Last Pain:  Vitals:   07/11/16 1315  TempSrc:   PainSc: 0-No pain                 Mick Tanguma S

## 2016-07-11 NOTE — Anesthesia Procedure Notes (Signed)
Procedure Name: LMA Insertion Date/Time: 07/11/2016 11:52 AM Performed by: Justice Rocher Pre-anesthesia Checklist: Patient identified, Emergency Drugs available, Suction available and Patient being monitored Patient Re-evaluated:Patient Re-evaluated prior to inductionOxygen Delivery Method: Circle system utilized Preoxygenation: Pre-oxygenation with 100% oxygen Intubation Type: IV induction Ventilation: Mask ventilation without difficulty LMA: LMA inserted LMA Size: 4.0 Number of attempts: 1 Airway Equipment and Method: Bite block Placement Confirmation: positive ETCO2 and breath sounds checked- equal and bilateral Tube secured with: Tape Dental Injury: Teeth and Oropharynx as per pre-operative assessment

## 2016-07-11 NOTE — Discharge Instructions (Signed)

## 2016-07-11 NOTE — H&P (Signed)
CC: I have blood in my urine.  HPI: Yvette Jenkins is a 60 year-old female established patient who is here for blood in the urine.  She did see the blood in her urine. She has seen blood clots.   She does not have a burning sensation when she urinates. She is not currently having trouble urinating.   She has had kidney stones.   the patient has had gross hematuria for approximately last 5 days. It has since resolved. This happened once before a few weeks ago. She has had a history of nephrolithiasis without surgery. She does notice small clots. She has end-stage renal disease and is on peritoneal dialysis. She only makes very small amount of urine each day. She is on the transplant list.   she does also note bilatera mid to lower back pain. This is worse with movement. She has no flank pain.     ALLERGIES: Aspirin TABS    MEDICATIONS: Amlodipine Besylate 10 mg tablet  Benifiber  Calcium + D TABS Oral  Ciprofloxacin Er 500 mg tablet,extended release multiphase 24 hr  Levothyroxine Sodium 175 MCG Oral Tablet Oral  Renvela 800 mg tablet  Sensipar  Vitamin B Complex CAPS Oral  Vitamin D3 CAPS Oral     GU PSH: None     PSH Notes: Cesarean Section   NON-GU PSH: Cesarean Delivery Only - 2015        GU PMH: Disorder Kidney/ureter, Unspec, Renal insufficiency - 2015 Kidney Stone, Nephrolithiasis - 2015 Renal Cysts, Simple, Bilateral renal cysts - 2015 Abdominal Pain Unspec, Right flank pain - 2015 Proteinuria, Unspec, Proteinuria - 2015    NON-GU PMH: Encounter for general adult medical examination without abnormal findings, Encounter for preventive health examination - 2015 Personal history of diseases of the blood and blood-forming organs and certain disorders involving the immune mechanism, History of sickle cell trait - 2015 Personal history of other endocrine, nutritional and metabolic disease, History of hyperthyroidism - 2015, History of hypercholesterolemia, - 2015    FAMILY  HISTORY: Diabetes - Runs In Family Hyperthyroidism - Runs In Family renal failure - Runs In Family sickle cell trait - Runs In Family   SOCIAL HISTORY: Marital Status: Married Current Smoking Status: Patient has never smoked.  Has never drank.  Drinks 1 caffeinated drink per day.     Notes: Alcohol use, Never a smoker, Married, Occupation, Number of children, Caffeine use   REVIEW OF SYSTEMS:     GU Review Female:  Patient reports get up at night to urinate and trouble starting your stream. Patient denies frequent urination, hard to postpone urination, burning /pain with urination, leakage of urine, stream starts and stops, have to strain to urinate, and currently pregnant.    Gastrointestinal (Upper):  Patient denies nausea, vomiting, and indigestion/ heartburn.    Gastrointestinal (Lower):  Patient reports constipation. Patient denies diarrhea.    Constitutional:  Patient reports fatigue. Patient denies fever, night sweats, and weight loss.    Skin:  Patient denies itching and skin rash/ lesion.    Eyes:  Patient denies blurred vision and double vision.    Ears/ Nose/ Throat:  Patient denies sore throat and sinus problems.    Hematologic/Lymphatic:  Patient denies swollen glands and easy bruising.    Cardiovascular:  Patient denies leg swelling and chest pains.    Respiratory:  Patient denies cough and shortness of breath.    Endocrine:  Patient denies excessive thirst.    Musculoskeletal:  Patient reports back pain.  Patient denies joint pain.    Neurological:  Patient denies headaches and dizziness.    Psychologic:  Patient denies depression and anxiety.    VITAL SIGNS:       06/28/2016 01:49 PM     BP 109/72 mmHg     Pulse 83 /min     Temperature 97.1 F / 36 C     MULTI-SYSTEM PHYSICAL EXAMINATION:      Constitutional: Well-nourished. No physical deformities. Normally developed. Good grooming.     Neck: Neck symmetrical, not swollen. Normal tracheal position.     Respiratory: No  labored breathing, no use of accessory muscles.      Cardiovascular: Normal temperature, normal extremity pulses, no swelling, no varicosities.     Skin: No paleness, no jaundice, no cyanosis. No lesion, no ulcer, no rash.     Gastrointestinal: No mass, no tenderness, no rigidity, non obese abdomen.     Eyes: Normal conjunctivae. Normal eyelids.     Ears, Nose, Mouth, and Throat: Left ear no scars, no lesions, no masses. Right ear no scars, no lesions, no masses. Nose no scars, no lesions, no masses. Normal hearing. Normal lips.     Musculoskeletal: Normal gait and station of head and neck.Tender to palpation in mid to lower spine bilaterally. No flank pain.            PAST DATA REVIEWED:   Source Of History:  Patient   PROCEDURES: None   ASSESSMENT:     ICD-10 Details  1 GU:  Gross hematuria - R31.0    PLAN:   Medications  New Meds: Norco 5 mg-325 mg tablet 1 tablet PO Q 4 H #15 0 Refill(s)    Orders    Schedule  X-Rays: 1 Week - C.T. Abdomen/Pelvis Without Contrast  Return Visit: Next Available Appointment - C.T. Abdomen/Pelvis  Procedure: Unspecified Date - Cystoscopy Retrogrades - 52005, bilateral  Document  Letter(s):  Created for Patient: Clinical Summary   Notes:  CT Showed bilateral stones < 4 mm. Also possible hemorrhage in left upper tract. She will undergo cystoscopy, bilateral retrograde pyelogram, possible left ureteroscopy, possible left ureteral stent.

## 2016-07-11 NOTE — Anesthesia Preprocedure Evaluation (Addendum)
Anesthesia Evaluation  Patient identified by MRN, date of birth, ID band Patient awake    Reviewed: Allergy & Precautions, NPO status , Patient's Chart, lab work & pertinent test results  History of Anesthesia Complications (+) PONV and history of anesthetic complications  Airway Mallampati: II  TM Distance: >3 FB Neck ROM: Full    Dental  (+) Teeth Intact, Dental Advisory Given   Pulmonary neg pulmonary ROS,    breath sounds clear to auscultation       Cardiovascular hypertension, Pt. on medications + CAD   Rhythm:Regular Rate:Normal     Neuro/Psych negative neurological ROS  negative psych ROS   GI/Hepatic negative GI ROS, Neg liver ROS,   Endo/Other  Hypothyroidism   Renal/GU ESRF and DialysisRenal disease  negative genitourinary   Musculoskeletal  (+) Arthritis , Osteoarthritis,    Abdominal   Peds negative pediatric ROS (+)  Hematology negative hematology ROS (+)   Anesthesia Other Findings   Reproductive/Obstetrics negative OB ROS                            Lab Results  Component Value Date   WBC 11.6 (H) 06/24/2015   HGB 9.9 (L) 07/11/2016   HCT 29.0 (L) 07/11/2016   MCV 82.3 06/24/2015   PLT 354 06/24/2015   Lab Results  Component Value Date   CREATININE 11.20 (H) 07/11/2016   BUN 42 (H) 07/11/2016   NA 133 (L) 07/11/2016   K 4.0 07/11/2016   CL 94 (L) 07/11/2016   CO2 21 (L) 06/24/2015   No results found for: INR, PROTIME  11/2015 EKG: normal sinus rhythm (per reading from [facility]).  Anesthesia Physical Anesthesia Plan  ASA: III  Anesthesia Plan: General   Post-op Pain Management:    Induction: Intravenous  Airway Management Planned: LMA  Additional Equipment:   Intra-op Plan:   Post-operative Plan: Extubation in OR  Informed Consent: I have reviewed the patients History and Physical, chart, labs and discussed the procedure including the  risks, benefits and alternatives for the proposed anesthesia with the patient or authorized representative who has indicated his/her understanding and acceptance.   Dental advisory given  Plan Discussed with: CRNA  Anesthesia Plan Comments:         Anesthesia Quick Evaluation

## 2016-07-12 ENCOUNTER — Encounter (HOSPITAL_BASED_OUTPATIENT_CLINIC_OR_DEPARTMENT_OTHER): Payer: Self-pay | Admitting: Urology

## 2016-07-12 DIAGNOSIS — E440 Moderate protein-calorie malnutrition: Secondary | ICD-10-CM | POA: Diagnosis not present

## 2016-07-12 DIAGNOSIS — D509 Iron deficiency anemia, unspecified: Secondary | ICD-10-CM | POA: Diagnosis not present

## 2016-07-12 DIAGNOSIS — N2581 Secondary hyperparathyroidism of renal origin: Secondary | ICD-10-CM | POA: Diagnosis not present

## 2016-07-12 DIAGNOSIS — N186 End stage renal disease: Secondary | ICD-10-CM | POA: Diagnosis not present

## 2016-07-12 DIAGNOSIS — Z79899 Other long term (current) drug therapy: Secondary | ICD-10-CM | POA: Diagnosis not present

## 2016-07-12 DIAGNOSIS — D631 Anemia in chronic kidney disease: Secondary | ICD-10-CM | POA: Diagnosis not present

## 2016-07-13 DIAGNOSIS — N2581 Secondary hyperparathyroidism of renal origin: Secondary | ICD-10-CM | POA: Diagnosis not present

## 2016-07-13 DIAGNOSIS — D509 Iron deficiency anemia, unspecified: Secondary | ICD-10-CM | POA: Diagnosis not present

## 2016-07-13 DIAGNOSIS — N186 End stage renal disease: Secondary | ICD-10-CM | POA: Diagnosis not present

## 2016-07-13 DIAGNOSIS — E440 Moderate protein-calorie malnutrition: Secondary | ICD-10-CM | POA: Diagnosis not present

## 2016-07-13 DIAGNOSIS — Z79899 Other long term (current) drug therapy: Secondary | ICD-10-CM | POA: Diagnosis not present

## 2016-07-13 DIAGNOSIS — D631 Anemia in chronic kidney disease: Secondary | ICD-10-CM | POA: Diagnosis not present

## 2016-07-14 DIAGNOSIS — Z79899 Other long term (current) drug therapy: Secondary | ICD-10-CM | POA: Diagnosis not present

## 2016-07-14 DIAGNOSIS — N2581 Secondary hyperparathyroidism of renal origin: Secondary | ICD-10-CM | POA: Diagnosis not present

## 2016-07-14 DIAGNOSIS — E440 Moderate protein-calorie malnutrition: Secondary | ICD-10-CM | POA: Diagnosis not present

## 2016-07-14 DIAGNOSIS — D509 Iron deficiency anemia, unspecified: Secondary | ICD-10-CM | POA: Diagnosis not present

## 2016-07-14 DIAGNOSIS — D631 Anemia in chronic kidney disease: Secondary | ICD-10-CM | POA: Diagnosis not present

## 2016-07-14 DIAGNOSIS — N186 End stage renal disease: Secondary | ICD-10-CM | POA: Diagnosis not present

## 2016-07-15 DIAGNOSIS — D509 Iron deficiency anemia, unspecified: Secondary | ICD-10-CM | POA: Diagnosis not present

## 2016-07-15 DIAGNOSIS — Z79899 Other long term (current) drug therapy: Secondary | ICD-10-CM | POA: Diagnosis not present

## 2016-07-15 DIAGNOSIS — N2581 Secondary hyperparathyroidism of renal origin: Secondary | ICD-10-CM | POA: Diagnosis not present

## 2016-07-15 DIAGNOSIS — E440 Moderate protein-calorie malnutrition: Secondary | ICD-10-CM | POA: Diagnosis not present

## 2016-07-15 DIAGNOSIS — D631 Anemia in chronic kidney disease: Secondary | ICD-10-CM | POA: Diagnosis not present

## 2016-07-15 DIAGNOSIS — N186 End stage renal disease: Secondary | ICD-10-CM | POA: Diagnosis not present

## 2016-07-16 DIAGNOSIS — E440 Moderate protein-calorie malnutrition: Secondary | ICD-10-CM | POA: Diagnosis not present

## 2016-07-16 DIAGNOSIS — Z79899 Other long term (current) drug therapy: Secondary | ICD-10-CM | POA: Diagnosis not present

## 2016-07-16 DIAGNOSIS — N2581 Secondary hyperparathyroidism of renal origin: Secondary | ICD-10-CM | POA: Diagnosis not present

## 2016-07-16 DIAGNOSIS — N186 End stage renal disease: Secondary | ICD-10-CM | POA: Diagnosis not present

## 2016-07-16 DIAGNOSIS — D631 Anemia in chronic kidney disease: Secondary | ICD-10-CM | POA: Diagnosis not present

## 2016-07-16 DIAGNOSIS — D509 Iron deficiency anemia, unspecified: Secondary | ICD-10-CM | POA: Diagnosis not present

## 2016-07-17 DIAGNOSIS — N186 End stage renal disease: Secondary | ICD-10-CM | POA: Diagnosis not present

## 2016-07-17 DIAGNOSIS — E890 Postprocedural hypothyroidism: Secondary | ICD-10-CM | POA: Diagnosis not present

## 2016-07-17 DIAGNOSIS — I129 Hypertensive chronic kidney disease with stage 1 through stage 4 chronic kidney disease, or unspecified chronic kidney disease: Secondary | ICD-10-CM | POA: Diagnosis not present

## 2016-07-17 DIAGNOSIS — D509 Iron deficiency anemia, unspecified: Secondary | ICD-10-CM | POA: Diagnosis not present

## 2016-07-17 DIAGNOSIS — R7309 Other abnormal glucose: Secondary | ICD-10-CM | POA: Diagnosis not present

## 2016-07-17 DIAGNOSIS — D631 Anemia in chronic kidney disease: Secondary | ICD-10-CM | POA: Diagnosis not present

## 2016-07-17 DIAGNOSIS — Z1389 Encounter for screening for other disorder: Secondary | ICD-10-CM | POA: Diagnosis not present

## 2016-07-17 DIAGNOSIS — I120 Hypertensive chronic kidney disease with stage 5 chronic kidney disease or end stage renal disease: Secondary | ICD-10-CM | POA: Diagnosis not present

## 2016-07-17 DIAGNOSIS — E440 Moderate protein-calorie malnutrition: Secondary | ICD-10-CM | POA: Diagnosis not present

## 2016-07-17 DIAGNOSIS — M5432 Sciatica, left side: Secondary | ICD-10-CM | POA: Diagnosis not present

## 2016-07-17 DIAGNOSIS — N2581 Secondary hyperparathyroidism of renal origin: Secondary | ICD-10-CM | POA: Diagnosis not present

## 2016-07-17 DIAGNOSIS — Z79899 Other long term (current) drug therapy: Secondary | ICD-10-CM | POA: Diagnosis not present

## 2016-07-18 DIAGNOSIS — D509 Iron deficiency anemia, unspecified: Secondary | ICD-10-CM | POA: Diagnosis not present

## 2016-07-18 DIAGNOSIS — Z79899 Other long term (current) drug therapy: Secondary | ICD-10-CM | POA: Diagnosis not present

## 2016-07-18 DIAGNOSIS — D631 Anemia in chronic kidney disease: Secondary | ICD-10-CM | POA: Diagnosis not present

## 2016-07-18 DIAGNOSIS — N2581 Secondary hyperparathyroidism of renal origin: Secondary | ICD-10-CM | POA: Diagnosis not present

## 2016-07-18 DIAGNOSIS — E440 Moderate protein-calorie malnutrition: Secondary | ICD-10-CM | POA: Diagnosis not present

## 2016-07-18 DIAGNOSIS — N186 End stage renal disease: Secondary | ICD-10-CM | POA: Diagnosis not present

## 2016-07-19 DIAGNOSIS — N186 End stage renal disease: Secondary | ICD-10-CM | POA: Diagnosis not present

## 2016-07-19 DIAGNOSIS — Z79899 Other long term (current) drug therapy: Secondary | ICD-10-CM | POA: Diagnosis not present

## 2016-07-19 DIAGNOSIS — N2581 Secondary hyperparathyroidism of renal origin: Secondary | ICD-10-CM | POA: Diagnosis not present

## 2016-07-19 DIAGNOSIS — D509 Iron deficiency anemia, unspecified: Secondary | ICD-10-CM | POA: Diagnosis not present

## 2016-07-19 DIAGNOSIS — D631 Anemia in chronic kidney disease: Secondary | ICD-10-CM | POA: Diagnosis not present

## 2016-07-19 DIAGNOSIS — E440 Moderate protein-calorie malnutrition: Secondary | ICD-10-CM | POA: Diagnosis not present

## 2016-07-20 DIAGNOSIS — D509 Iron deficiency anemia, unspecified: Secondary | ICD-10-CM | POA: Diagnosis not present

## 2016-07-20 DIAGNOSIS — N186 End stage renal disease: Secondary | ICD-10-CM | POA: Diagnosis not present

## 2016-07-20 DIAGNOSIS — D631 Anemia in chronic kidney disease: Secondary | ICD-10-CM | POA: Diagnosis not present

## 2016-07-20 DIAGNOSIS — E440 Moderate protein-calorie malnutrition: Secondary | ICD-10-CM | POA: Diagnosis not present

## 2016-07-20 DIAGNOSIS — N2581 Secondary hyperparathyroidism of renal origin: Secondary | ICD-10-CM | POA: Diagnosis not present

## 2016-07-20 DIAGNOSIS — Z79899 Other long term (current) drug therapy: Secondary | ICD-10-CM | POA: Diagnosis not present

## 2016-07-21 DIAGNOSIS — N2581 Secondary hyperparathyroidism of renal origin: Secondary | ICD-10-CM | POA: Diagnosis not present

## 2016-07-21 DIAGNOSIS — E440 Moderate protein-calorie malnutrition: Secondary | ICD-10-CM | POA: Diagnosis not present

## 2016-07-21 DIAGNOSIS — Z79899 Other long term (current) drug therapy: Secondary | ICD-10-CM | POA: Diagnosis not present

## 2016-07-21 DIAGNOSIS — D509 Iron deficiency anemia, unspecified: Secondary | ICD-10-CM | POA: Diagnosis not present

## 2016-07-21 DIAGNOSIS — D631 Anemia in chronic kidney disease: Secondary | ICD-10-CM | POA: Diagnosis not present

## 2016-07-21 DIAGNOSIS — N186 End stage renal disease: Secondary | ICD-10-CM | POA: Diagnosis not present

## 2016-07-22 DIAGNOSIS — Z79899 Other long term (current) drug therapy: Secondary | ICD-10-CM | POA: Diagnosis not present

## 2016-07-22 DIAGNOSIS — N186 End stage renal disease: Secondary | ICD-10-CM | POA: Diagnosis not present

## 2016-07-22 DIAGNOSIS — E440 Moderate protein-calorie malnutrition: Secondary | ICD-10-CM | POA: Diagnosis not present

## 2016-07-22 DIAGNOSIS — N2581 Secondary hyperparathyroidism of renal origin: Secondary | ICD-10-CM | POA: Diagnosis not present

## 2016-07-22 DIAGNOSIS — D631 Anemia in chronic kidney disease: Secondary | ICD-10-CM | POA: Diagnosis not present

## 2016-07-22 DIAGNOSIS — D509 Iron deficiency anemia, unspecified: Secondary | ICD-10-CM | POA: Diagnosis not present

## 2016-07-23 DIAGNOSIS — E440 Moderate protein-calorie malnutrition: Secondary | ICD-10-CM | POA: Diagnosis not present

## 2016-07-23 DIAGNOSIS — D509 Iron deficiency anemia, unspecified: Secondary | ICD-10-CM | POA: Diagnosis not present

## 2016-07-23 DIAGNOSIS — N2581 Secondary hyperparathyroidism of renal origin: Secondary | ICD-10-CM | POA: Diagnosis not present

## 2016-07-23 DIAGNOSIS — D631 Anemia in chronic kidney disease: Secondary | ICD-10-CM | POA: Diagnosis not present

## 2016-07-23 DIAGNOSIS — N186 End stage renal disease: Secondary | ICD-10-CM | POA: Diagnosis not present

## 2016-07-23 DIAGNOSIS — Z79899 Other long term (current) drug therapy: Secondary | ICD-10-CM | POA: Diagnosis not present

## 2016-07-24 DIAGNOSIS — D509 Iron deficiency anemia, unspecified: Secondary | ICD-10-CM | POA: Diagnosis not present

## 2016-07-24 DIAGNOSIS — N186 End stage renal disease: Secondary | ICD-10-CM | POA: Diagnosis not present

## 2016-07-24 DIAGNOSIS — D49512 Neoplasm of unspecified behavior of left kidney: Secondary | ICD-10-CM | POA: Diagnosis not present

## 2016-07-24 DIAGNOSIS — Z79899 Other long term (current) drug therapy: Secondary | ICD-10-CM | POA: Diagnosis not present

## 2016-07-24 DIAGNOSIS — R310 Gross hematuria: Secondary | ICD-10-CM | POA: Diagnosis not present

## 2016-07-24 DIAGNOSIS — E440 Moderate protein-calorie malnutrition: Secondary | ICD-10-CM | POA: Diagnosis not present

## 2016-07-24 DIAGNOSIS — N2581 Secondary hyperparathyroidism of renal origin: Secondary | ICD-10-CM | POA: Diagnosis not present

## 2016-07-24 DIAGNOSIS — D631 Anemia in chronic kidney disease: Secondary | ICD-10-CM | POA: Diagnosis not present

## 2016-07-25 ENCOUNTER — Other Ambulatory Visit: Payer: Self-pay | Admitting: Urology

## 2016-07-25 DIAGNOSIS — D631 Anemia in chronic kidney disease: Secondary | ICD-10-CM | POA: Diagnosis not present

## 2016-07-25 DIAGNOSIS — E440 Moderate protein-calorie malnutrition: Secondary | ICD-10-CM | POA: Diagnosis not present

## 2016-07-25 DIAGNOSIS — D509 Iron deficiency anemia, unspecified: Secondary | ICD-10-CM | POA: Diagnosis not present

## 2016-07-25 DIAGNOSIS — N2581 Secondary hyperparathyroidism of renal origin: Secondary | ICD-10-CM | POA: Diagnosis not present

## 2016-07-25 DIAGNOSIS — Z79899 Other long term (current) drug therapy: Secondary | ICD-10-CM | POA: Diagnosis not present

## 2016-07-25 DIAGNOSIS — N186 End stage renal disease: Secondary | ICD-10-CM | POA: Diagnosis not present

## 2016-07-25 DIAGNOSIS — D49512 Neoplasm of unspecified behavior of left kidney: Secondary | ICD-10-CM

## 2016-07-26 DIAGNOSIS — Z79899 Other long term (current) drug therapy: Secondary | ICD-10-CM | POA: Diagnosis not present

## 2016-07-26 DIAGNOSIS — E440 Moderate protein-calorie malnutrition: Secondary | ICD-10-CM | POA: Diagnosis not present

## 2016-07-26 DIAGNOSIS — N2581 Secondary hyperparathyroidism of renal origin: Secondary | ICD-10-CM | POA: Diagnosis not present

## 2016-07-26 DIAGNOSIS — D509 Iron deficiency anemia, unspecified: Secondary | ICD-10-CM | POA: Diagnosis not present

## 2016-07-26 DIAGNOSIS — N186 End stage renal disease: Secondary | ICD-10-CM | POA: Diagnosis not present

## 2016-07-26 DIAGNOSIS — D631 Anemia in chronic kidney disease: Secondary | ICD-10-CM | POA: Diagnosis not present

## 2016-07-27 DIAGNOSIS — E440 Moderate protein-calorie malnutrition: Secondary | ICD-10-CM | POA: Diagnosis not present

## 2016-07-27 DIAGNOSIS — N186 End stage renal disease: Secondary | ICD-10-CM | POA: Diagnosis not present

## 2016-07-27 DIAGNOSIS — D631 Anemia in chronic kidney disease: Secondary | ICD-10-CM | POA: Diagnosis not present

## 2016-07-27 DIAGNOSIS — D509 Iron deficiency anemia, unspecified: Secondary | ICD-10-CM | POA: Diagnosis not present

## 2016-07-27 DIAGNOSIS — N2581 Secondary hyperparathyroidism of renal origin: Secondary | ICD-10-CM | POA: Diagnosis not present

## 2016-07-27 DIAGNOSIS — Z79899 Other long term (current) drug therapy: Secondary | ICD-10-CM | POA: Diagnosis not present

## 2016-07-28 DIAGNOSIS — Z79899 Other long term (current) drug therapy: Secondary | ICD-10-CM | POA: Diagnosis not present

## 2016-07-28 DIAGNOSIS — D509 Iron deficiency anemia, unspecified: Secondary | ICD-10-CM | POA: Diagnosis not present

## 2016-07-28 DIAGNOSIS — N186 End stage renal disease: Secondary | ICD-10-CM | POA: Diagnosis not present

## 2016-07-28 DIAGNOSIS — D631 Anemia in chronic kidney disease: Secondary | ICD-10-CM | POA: Diagnosis not present

## 2016-07-28 DIAGNOSIS — N2581 Secondary hyperparathyroidism of renal origin: Secondary | ICD-10-CM | POA: Diagnosis not present

## 2016-07-28 DIAGNOSIS — E440 Moderate protein-calorie malnutrition: Secondary | ICD-10-CM | POA: Diagnosis not present

## 2016-07-29 DIAGNOSIS — N2581 Secondary hyperparathyroidism of renal origin: Secondary | ICD-10-CM | POA: Diagnosis not present

## 2016-07-29 DIAGNOSIS — N186 End stage renal disease: Secondary | ICD-10-CM | POA: Diagnosis not present

## 2016-07-29 DIAGNOSIS — Z79899 Other long term (current) drug therapy: Secondary | ICD-10-CM | POA: Diagnosis not present

## 2016-07-29 DIAGNOSIS — D509 Iron deficiency anemia, unspecified: Secondary | ICD-10-CM | POA: Diagnosis not present

## 2016-07-29 DIAGNOSIS — E440 Moderate protein-calorie malnutrition: Secondary | ICD-10-CM | POA: Diagnosis not present

## 2016-07-29 DIAGNOSIS — D631 Anemia in chronic kidney disease: Secondary | ICD-10-CM | POA: Diagnosis not present

## 2016-07-30 DIAGNOSIS — N2581 Secondary hyperparathyroidism of renal origin: Secondary | ICD-10-CM | POA: Diagnosis not present

## 2016-07-30 DIAGNOSIS — E440 Moderate protein-calorie malnutrition: Secondary | ICD-10-CM | POA: Diagnosis not present

## 2016-07-30 DIAGNOSIS — D631 Anemia in chronic kidney disease: Secondary | ICD-10-CM | POA: Diagnosis not present

## 2016-07-30 DIAGNOSIS — Z79899 Other long term (current) drug therapy: Secondary | ICD-10-CM | POA: Diagnosis not present

## 2016-07-30 DIAGNOSIS — N186 End stage renal disease: Secondary | ICD-10-CM | POA: Diagnosis not present

## 2016-07-30 DIAGNOSIS — D509 Iron deficiency anemia, unspecified: Secondary | ICD-10-CM | POA: Diagnosis not present

## 2016-07-31 DIAGNOSIS — Z79899 Other long term (current) drug therapy: Secondary | ICD-10-CM | POA: Diagnosis not present

## 2016-07-31 DIAGNOSIS — E440 Moderate protein-calorie malnutrition: Secondary | ICD-10-CM | POA: Diagnosis not present

## 2016-07-31 DIAGNOSIS — D631 Anemia in chronic kidney disease: Secondary | ICD-10-CM | POA: Diagnosis not present

## 2016-07-31 DIAGNOSIS — N2581 Secondary hyperparathyroidism of renal origin: Secondary | ICD-10-CM | POA: Diagnosis not present

## 2016-07-31 DIAGNOSIS — N186 End stage renal disease: Secondary | ICD-10-CM | POA: Diagnosis not present

## 2016-07-31 DIAGNOSIS — D509 Iron deficiency anemia, unspecified: Secondary | ICD-10-CM | POA: Diagnosis not present

## 2016-08-01 DIAGNOSIS — N186 End stage renal disease: Secondary | ICD-10-CM | POA: Diagnosis not present

## 2016-08-01 DIAGNOSIS — N2581 Secondary hyperparathyroidism of renal origin: Secondary | ICD-10-CM | POA: Diagnosis not present

## 2016-08-01 DIAGNOSIS — E440 Moderate protein-calorie malnutrition: Secondary | ICD-10-CM | POA: Diagnosis not present

## 2016-08-01 DIAGNOSIS — D631 Anemia in chronic kidney disease: Secondary | ICD-10-CM | POA: Diagnosis not present

## 2016-08-01 DIAGNOSIS — D509 Iron deficiency anemia, unspecified: Secondary | ICD-10-CM | POA: Diagnosis not present

## 2016-08-01 DIAGNOSIS — Z79899 Other long term (current) drug therapy: Secondary | ICD-10-CM | POA: Diagnosis not present

## 2016-08-02 DIAGNOSIS — N186 End stage renal disease: Secondary | ICD-10-CM | POA: Diagnosis not present

## 2016-08-02 DIAGNOSIS — D509 Iron deficiency anemia, unspecified: Secondary | ICD-10-CM | POA: Diagnosis not present

## 2016-08-02 DIAGNOSIS — Z79899 Other long term (current) drug therapy: Secondary | ICD-10-CM | POA: Diagnosis not present

## 2016-08-02 DIAGNOSIS — D631 Anemia in chronic kidney disease: Secondary | ICD-10-CM | POA: Diagnosis not present

## 2016-08-02 DIAGNOSIS — N2581 Secondary hyperparathyroidism of renal origin: Secondary | ICD-10-CM | POA: Diagnosis not present

## 2016-08-02 DIAGNOSIS — E440 Moderate protein-calorie malnutrition: Secondary | ICD-10-CM | POA: Diagnosis not present

## 2016-08-03 DIAGNOSIS — E440 Moderate protein-calorie malnutrition: Secondary | ICD-10-CM | POA: Diagnosis not present

## 2016-08-03 DIAGNOSIS — Z992 Dependence on renal dialysis: Secondary | ICD-10-CM | POA: Diagnosis not present

## 2016-08-03 DIAGNOSIS — D509 Iron deficiency anemia, unspecified: Secondary | ICD-10-CM | POA: Diagnosis not present

## 2016-08-03 DIAGNOSIS — N2889 Other specified disorders of kidney and ureter: Secondary | ICD-10-CM | POA: Diagnosis not present

## 2016-08-03 DIAGNOSIS — D631 Anemia in chronic kidney disease: Secondary | ICD-10-CM | POA: Diagnosis not present

## 2016-08-03 DIAGNOSIS — N186 End stage renal disease: Secondary | ICD-10-CM | POA: Diagnosis not present

## 2016-08-03 DIAGNOSIS — N2581 Secondary hyperparathyroidism of renal origin: Secondary | ICD-10-CM | POA: Diagnosis not present

## 2016-08-03 DIAGNOSIS — Z79899 Other long term (current) drug therapy: Secondary | ICD-10-CM | POA: Diagnosis not present

## 2016-08-04 DIAGNOSIS — E440 Moderate protein-calorie malnutrition: Secondary | ICD-10-CM | POA: Diagnosis not present

## 2016-08-04 DIAGNOSIS — D509 Iron deficiency anemia, unspecified: Secondary | ICD-10-CM | POA: Diagnosis not present

## 2016-08-04 DIAGNOSIS — N186 End stage renal disease: Secondary | ICD-10-CM | POA: Diagnosis not present

## 2016-08-04 DIAGNOSIS — Z79899 Other long term (current) drug therapy: Secondary | ICD-10-CM | POA: Diagnosis not present

## 2016-08-04 DIAGNOSIS — N2581 Secondary hyperparathyroidism of renal origin: Secondary | ICD-10-CM | POA: Diagnosis not present

## 2016-08-04 DIAGNOSIS — D631 Anemia in chronic kidney disease: Secondary | ICD-10-CM | POA: Diagnosis not present

## 2016-08-05 DIAGNOSIS — N186 End stage renal disease: Secondary | ICD-10-CM | POA: Diagnosis not present

## 2016-08-05 DIAGNOSIS — E440 Moderate protein-calorie malnutrition: Secondary | ICD-10-CM | POA: Diagnosis not present

## 2016-08-05 DIAGNOSIS — D509 Iron deficiency anemia, unspecified: Secondary | ICD-10-CM | POA: Diagnosis not present

## 2016-08-05 DIAGNOSIS — Z79899 Other long term (current) drug therapy: Secondary | ICD-10-CM | POA: Diagnosis not present

## 2016-08-05 DIAGNOSIS — N2581 Secondary hyperparathyroidism of renal origin: Secondary | ICD-10-CM | POA: Diagnosis not present

## 2016-08-05 DIAGNOSIS — D631 Anemia in chronic kidney disease: Secondary | ICD-10-CM | POA: Diagnosis not present

## 2016-08-06 DIAGNOSIS — D631 Anemia in chronic kidney disease: Secondary | ICD-10-CM | POA: Diagnosis not present

## 2016-08-06 DIAGNOSIS — N2581 Secondary hyperparathyroidism of renal origin: Secondary | ICD-10-CM | POA: Diagnosis not present

## 2016-08-06 DIAGNOSIS — D509 Iron deficiency anemia, unspecified: Secondary | ICD-10-CM | POA: Diagnosis not present

## 2016-08-06 DIAGNOSIS — E440 Moderate protein-calorie malnutrition: Secondary | ICD-10-CM | POA: Diagnosis not present

## 2016-08-06 DIAGNOSIS — N186 End stage renal disease: Secondary | ICD-10-CM | POA: Diagnosis not present

## 2016-08-06 DIAGNOSIS — Z79899 Other long term (current) drug therapy: Secondary | ICD-10-CM | POA: Diagnosis not present

## 2016-08-07 ENCOUNTER — Ambulatory Visit (HOSPITAL_COMMUNITY)
Admission: RE | Admit: 2016-08-07 | Discharge: 2016-08-07 | Disposition: A | Discharge status: Home / Self Care | Payer: MEDICARE | Source: Ambulatory Visit | Attending: Urology | Admitting: Urology

## 2016-08-07 DIAGNOSIS — R160 Hepatomegaly, not elsewhere classified: Secondary | ICD-10-CM | POA: Diagnosis not present

## 2016-08-07 DIAGNOSIS — R188 Other ascites: Secondary | ICD-10-CM | POA: Insufficient documentation

## 2016-08-07 DIAGNOSIS — D631 Anemia in chronic kidney disease: Secondary | ICD-10-CM | POA: Diagnosis not present

## 2016-08-07 DIAGNOSIS — N186 End stage renal disease: Secondary | ICD-10-CM | POA: Diagnosis not present

## 2016-08-07 DIAGNOSIS — R8299 Other abnormal findings in urine: Secondary | ICD-10-CM | POA: Diagnosis not present

## 2016-08-07 DIAGNOSIS — Z79899 Other long term (current) drug therapy: Secondary | ICD-10-CM | POA: Diagnosis not present

## 2016-08-07 DIAGNOSIS — N289 Disorder of kidney and ureter, unspecified: Secondary | ICD-10-CM | POA: Insufficient documentation

## 2016-08-07 DIAGNOSIS — N19 Unspecified kidney failure: Secondary | ICD-10-CM | POA: Diagnosis not present

## 2016-08-07 DIAGNOSIS — E440 Moderate protein-calorie malnutrition: Secondary | ICD-10-CM | POA: Diagnosis not present

## 2016-08-07 DIAGNOSIS — D509 Iron deficiency anemia, unspecified: Secondary | ICD-10-CM | POA: Diagnosis not present

## 2016-08-07 DIAGNOSIS — D49512 Neoplasm of unspecified behavior of left kidney: Secondary | ICD-10-CM | POA: Diagnosis not present

## 2016-08-07 DIAGNOSIS — N2581 Secondary hyperparathyroidism of renal origin: Secondary | ICD-10-CM | POA: Diagnosis not present

## 2016-08-07 DIAGNOSIS — K8020 Calculus of gallbladder without cholecystitis without obstruction: Secondary | ICD-10-CM | POA: Insufficient documentation

## 2016-08-08 DIAGNOSIS — D631 Anemia in chronic kidney disease: Secondary | ICD-10-CM | POA: Diagnosis not present

## 2016-08-08 DIAGNOSIS — N186 End stage renal disease: Secondary | ICD-10-CM | POA: Diagnosis not present

## 2016-08-08 DIAGNOSIS — N2581 Secondary hyperparathyroidism of renal origin: Secondary | ICD-10-CM | POA: Diagnosis not present

## 2016-08-08 DIAGNOSIS — E440 Moderate protein-calorie malnutrition: Secondary | ICD-10-CM | POA: Diagnosis not present

## 2016-08-08 DIAGNOSIS — Z79899 Other long term (current) drug therapy: Secondary | ICD-10-CM | POA: Diagnosis not present

## 2016-08-08 DIAGNOSIS — D509 Iron deficiency anemia, unspecified: Secondary | ICD-10-CM | POA: Diagnosis not present

## 2016-08-09 DIAGNOSIS — D509 Iron deficiency anemia, unspecified: Secondary | ICD-10-CM | POA: Diagnosis not present

## 2016-08-09 DIAGNOSIS — D631 Anemia in chronic kidney disease: Secondary | ICD-10-CM | POA: Diagnosis not present

## 2016-08-09 DIAGNOSIS — Z79899 Other long term (current) drug therapy: Secondary | ICD-10-CM | POA: Diagnosis not present

## 2016-08-09 DIAGNOSIS — N2581 Secondary hyperparathyroidism of renal origin: Secondary | ICD-10-CM | POA: Diagnosis not present

## 2016-08-09 DIAGNOSIS — N186 End stage renal disease: Secondary | ICD-10-CM | POA: Diagnosis not present

## 2016-08-09 DIAGNOSIS — E440 Moderate protein-calorie malnutrition: Secondary | ICD-10-CM | POA: Diagnosis not present

## 2016-08-10 DIAGNOSIS — D631 Anemia in chronic kidney disease: Secondary | ICD-10-CM | POA: Diagnosis not present

## 2016-08-10 DIAGNOSIS — N186 End stage renal disease: Secondary | ICD-10-CM | POA: Diagnosis not present

## 2016-08-10 DIAGNOSIS — E440 Moderate protein-calorie malnutrition: Secondary | ICD-10-CM | POA: Diagnosis not present

## 2016-08-10 DIAGNOSIS — D509 Iron deficiency anemia, unspecified: Secondary | ICD-10-CM | POA: Diagnosis not present

## 2016-08-10 DIAGNOSIS — Z79899 Other long term (current) drug therapy: Secondary | ICD-10-CM | POA: Diagnosis not present

## 2016-08-10 DIAGNOSIS — N2581 Secondary hyperparathyroidism of renal origin: Secondary | ICD-10-CM | POA: Diagnosis not present

## 2016-08-11 DIAGNOSIS — D631 Anemia in chronic kidney disease: Secondary | ICD-10-CM | POA: Diagnosis not present

## 2016-08-11 DIAGNOSIS — Z79899 Other long term (current) drug therapy: Secondary | ICD-10-CM | POA: Diagnosis not present

## 2016-08-11 DIAGNOSIS — D509 Iron deficiency anemia, unspecified: Secondary | ICD-10-CM | POA: Diagnosis not present

## 2016-08-11 DIAGNOSIS — E440 Moderate protein-calorie malnutrition: Secondary | ICD-10-CM | POA: Diagnosis not present

## 2016-08-11 DIAGNOSIS — N186 End stage renal disease: Secondary | ICD-10-CM | POA: Diagnosis not present

## 2016-08-11 DIAGNOSIS — N2581 Secondary hyperparathyroidism of renal origin: Secondary | ICD-10-CM | POA: Diagnosis not present

## 2016-08-12 DIAGNOSIS — D631 Anemia in chronic kidney disease: Secondary | ICD-10-CM | POA: Diagnosis not present

## 2016-08-12 DIAGNOSIS — Z79899 Other long term (current) drug therapy: Secondary | ICD-10-CM | POA: Diagnosis not present

## 2016-08-12 DIAGNOSIS — N2581 Secondary hyperparathyroidism of renal origin: Secondary | ICD-10-CM | POA: Diagnosis not present

## 2016-08-12 DIAGNOSIS — N186 End stage renal disease: Secondary | ICD-10-CM | POA: Diagnosis not present

## 2016-08-12 DIAGNOSIS — E440 Moderate protein-calorie malnutrition: Secondary | ICD-10-CM | POA: Diagnosis not present

## 2016-08-12 DIAGNOSIS — D509 Iron deficiency anemia, unspecified: Secondary | ICD-10-CM | POA: Diagnosis not present

## 2016-08-13 DIAGNOSIS — E440 Moderate protein-calorie malnutrition: Secondary | ICD-10-CM | POA: Diagnosis not present

## 2016-08-13 DIAGNOSIS — N186 End stage renal disease: Secondary | ICD-10-CM | POA: Diagnosis not present

## 2016-08-13 DIAGNOSIS — D631 Anemia in chronic kidney disease: Secondary | ICD-10-CM | POA: Diagnosis not present

## 2016-08-13 DIAGNOSIS — D509 Iron deficiency anemia, unspecified: Secondary | ICD-10-CM | POA: Diagnosis not present

## 2016-08-13 DIAGNOSIS — N2581 Secondary hyperparathyroidism of renal origin: Secondary | ICD-10-CM | POA: Diagnosis not present

## 2016-08-13 DIAGNOSIS — Z79899 Other long term (current) drug therapy: Secondary | ICD-10-CM | POA: Diagnosis not present

## 2016-08-14 DIAGNOSIS — D631 Anemia in chronic kidney disease: Secondary | ICD-10-CM | POA: Diagnosis not present

## 2016-08-14 DIAGNOSIS — N186 End stage renal disease: Secondary | ICD-10-CM | POA: Diagnosis not present

## 2016-08-14 DIAGNOSIS — N2581 Secondary hyperparathyroidism of renal origin: Secondary | ICD-10-CM | POA: Diagnosis not present

## 2016-08-14 DIAGNOSIS — D509 Iron deficiency anemia, unspecified: Secondary | ICD-10-CM | POA: Diagnosis not present

## 2016-08-14 DIAGNOSIS — E440 Moderate protein-calorie malnutrition: Secondary | ICD-10-CM | POA: Diagnosis not present

## 2016-08-14 DIAGNOSIS — Z79899 Other long term (current) drug therapy: Secondary | ICD-10-CM | POA: Diagnosis not present

## 2016-08-15 DIAGNOSIS — N2581 Secondary hyperparathyroidism of renal origin: Secondary | ICD-10-CM | POA: Diagnosis not present

## 2016-08-15 DIAGNOSIS — Z79899 Other long term (current) drug therapy: Secondary | ICD-10-CM | POA: Diagnosis not present

## 2016-08-15 DIAGNOSIS — E440 Moderate protein-calorie malnutrition: Secondary | ICD-10-CM | POA: Diagnosis not present

## 2016-08-15 DIAGNOSIS — D631 Anemia in chronic kidney disease: Secondary | ICD-10-CM | POA: Diagnosis not present

## 2016-08-15 DIAGNOSIS — N186 End stage renal disease: Secondary | ICD-10-CM | POA: Diagnosis not present

## 2016-08-15 DIAGNOSIS — D509 Iron deficiency anemia, unspecified: Secondary | ICD-10-CM | POA: Diagnosis not present

## 2016-08-16 DIAGNOSIS — E440 Moderate protein-calorie malnutrition: Secondary | ICD-10-CM | POA: Diagnosis not present

## 2016-08-16 DIAGNOSIS — D631 Anemia in chronic kidney disease: Secondary | ICD-10-CM | POA: Diagnosis not present

## 2016-08-16 DIAGNOSIS — N186 End stage renal disease: Secondary | ICD-10-CM | POA: Diagnosis not present

## 2016-08-16 DIAGNOSIS — D509 Iron deficiency anemia, unspecified: Secondary | ICD-10-CM | POA: Diagnosis not present

## 2016-08-16 DIAGNOSIS — Z79899 Other long term (current) drug therapy: Secondary | ICD-10-CM | POA: Diagnosis not present

## 2016-08-16 DIAGNOSIS — N2581 Secondary hyperparathyroidism of renal origin: Secondary | ICD-10-CM | POA: Diagnosis not present

## 2016-08-17 DIAGNOSIS — N186 End stage renal disease: Secondary | ICD-10-CM | POA: Diagnosis not present

## 2016-08-17 DIAGNOSIS — N2581 Secondary hyperparathyroidism of renal origin: Secondary | ICD-10-CM | POA: Diagnosis not present

## 2016-08-17 DIAGNOSIS — D631 Anemia in chronic kidney disease: Secondary | ICD-10-CM | POA: Diagnosis not present

## 2016-08-17 DIAGNOSIS — E440 Moderate protein-calorie malnutrition: Secondary | ICD-10-CM | POA: Diagnosis not present

## 2016-08-17 DIAGNOSIS — Z79899 Other long term (current) drug therapy: Secondary | ICD-10-CM | POA: Diagnosis not present

## 2016-08-17 DIAGNOSIS — D509 Iron deficiency anemia, unspecified: Secondary | ICD-10-CM | POA: Diagnosis not present

## 2016-08-18 DIAGNOSIS — D509 Iron deficiency anemia, unspecified: Secondary | ICD-10-CM | POA: Diagnosis not present

## 2016-08-18 DIAGNOSIS — D631 Anemia in chronic kidney disease: Secondary | ICD-10-CM | POA: Diagnosis not present

## 2016-08-18 DIAGNOSIS — E440 Moderate protein-calorie malnutrition: Secondary | ICD-10-CM | POA: Diagnosis not present

## 2016-08-18 DIAGNOSIS — Z79899 Other long term (current) drug therapy: Secondary | ICD-10-CM | POA: Diagnosis not present

## 2016-08-18 DIAGNOSIS — N2581 Secondary hyperparathyroidism of renal origin: Secondary | ICD-10-CM | POA: Diagnosis not present

## 2016-08-18 DIAGNOSIS — N186 End stage renal disease: Secondary | ICD-10-CM | POA: Diagnosis not present

## 2016-08-19 DIAGNOSIS — D509 Iron deficiency anemia, unspecified: Secondary | ICD-10-CM | POA: Diagnosis not present

## 2016-08-19 DIAGNOSIS — N186 End stage renal disease: Secondary | ICD-10-CM | POA: Diagnosis not present

## 2016-08-19 DIAGNOSIS — E440 Moderate protein-calorie malnutrition: Secondary | ICD-10-CM | POA: Diagnosis not present

## 2016-08-19 DIAGNOSIS — Z79899 Other long term (current) drug therapy: Secondary | ICD-10-CM | POA: Diagnosis not present

## 2016-08-19 DIAGNOSIS — D631 Anemia in chronic kidney disease: Secondary | ICD-10-CM | POA: Diagnosis not present

## 2016-08-19 DIAGNOSIS — N2581 Secondary hyperparathyroidism of renal origin: Secondary | ICD-10-CM | POA: Diagnosis not present

## 2016-08-20 DIAGNOSIS — N2581 Secondary hyperparathyroidism of renal origin: Secondary | ICD-10-CM | POA: Diagnosis not present

## 2016-08-20 DIAGNOSIS — D631 Anemia in chronic kidney disease: Secondary | ICD-10-CM | POA: Diagnosis not present

## 2016-08-20 DIAGNOSIS — E440 Moderate protein-calorie malnutrition: Secondary | ICD-10-CM | POA: Diagnosis not present

## 2016-08-20 DIAGNOSIS — Z79899 Other long term (current) drug therapy: Secondary | ICD-10-CM | POA: Diagnosis not present

## 2016-08-20 DIAGNOSIS — N186 End stage renal disease: Secondary | ICD-10-CM | POA: Diagnosis not present

## 2016-08-20 DIAGNOSIS — D509 Iron deficiency anemia, unspecified: Secondary | ICD-10-CM | POA: Diagnosis not present

## 2016-08-21 DIAGNOSIS — D509 Iron deficiency anemia, unspecified: Secondary | ICD-10-CM | POA: Diagnosis not present

## 2016-08-21 DIAGNOSIS — R310 Gross hematuria: Secondary | ICD-10-CM | POA: Diagnosis not present

## 2016-08-21 DIAGNOSIS — Z79899 Other long term (current) drug therapy: Secondary | ICD-10-CM | POA: Diagnosis not present

## 2016-08-21 DIAGNOSIS — E440 Moderate protein-calorie malnutrition: Secondary | ICD-10-CM | POA: Diagnosis not present

## 2016-08-21 DIAGNOSIS — N2581 Secondary hyperparathyroidism of renal origin: Secondary | ICD-10-CM | POA: Diagnosis not present

## 2016-08-21 DIAGNOSIS — N186 End stage renal disease: Secondary | ICD-10-CM | POA: Diagnosis not present

## 2016-08-21 DIAGNOSIS — D631 Anemia in chronic kidney disease: Secondary | ICD-10-CM | POA: Diagnosis not present

## 2016-08-22 DIAGNOSIS — E440 Moderate protein-calorie malnutrition: Secondary | ICD-10-CM | POA: Diagnosis not present

## 2016-08-22 DIAGNOSIS — N186 End stage renal disease: Secondary | ICD-10-CM | POA: Diagnosis not present

## 2016-08-22 DIAGNOSIS — N2581 Secondary hyperparathyroidism of renal origin: Secondary | ICD-10-CM | POA: Diagnosis not present

## 2016-08-22 DIAGNOSIS — D631 Anemia in chronic kidney disease: Secondary | ICD-10-CM | POA: Diagnosis not present

## 2016-08-22 DIAGNOSIS — Z79899 Other long term (current) drug therapy: Secondary | ICD-10-CM | POA: Diagnosis not present

## 2016-08-22 DIAGNOSIS — D509 Iron deficiency anemia, unspecified: Secondary | ICD-10-CM | POA: Diagnosis not present

## 2016-08-23 DIAGNOSIS — Z79899 Other long term (current) drug therapy: Secondary | ICD-10-CM | POA: Diagnosis not present

## 2016-08-23 DIAGNOSIS — N2581 Secondary hyperparathyroidism of renal origin: Secondary | ICD-10-CM | POA: Diagnosis not present

## 2016-08-23 DIAGNOSIS — N186 End stage renal disease: Secondary | ICD-10-CM | POA: Diagnosis not present

## 2016-08-23 DIAGNOSIS — D631 Anemia in chronic kidney disease: Secondary | ICD-10-CM | POA: Diagnosis not present

## 2016-08-23 DIAGNOSIS — D509 Iron deficiency anemia, unspecified: Secondary | ICD-10-CM | POA: Diagnosis not present

## 2016-08-23 DIAGNOSIS — E440 Moderate protein-calorie malnutrition: Secondary | ICD-10-CM | POA: Diagnosis not present

## 2016-08-24 DIAGNOSIS — Z79899 Other long term (current) drug therapy: Secondary | ICD-10-CM | POA: Diagnosis not present

## 2016-08-24 DIAGNOSIS — N2581 Secondary hyperparathyroidism of renal origin: Secondary | ICD-10-CM | POA: Diagnosis not present

## 2016-08-24 DIAGNOSIS — D509 Iron deficiency anemia, unspecified: Secondary | ICD-10-CM | POA: Diagnosis not present

## 2016-08-24 DIAGNOSIS — D631 Anemia in chronic kidney disease: Secondary | ICD-10-CM | POA: Diagnosis not present

## 2016-08-24 DIAGNOSIS — N186 End stage renal disease: Secondary | ICD-10-CM | POA: Diagnosis not present

## 2016-08-24 DIAGNOSIS — E440 Moderate protein-calorie malnutrition: Secondary | ICD-10-CM | POA: Diagnosis not present

## 2016-08-25 DIAGNOSIS — D509 Iron deficiency anemia, unspecified: Secondary | ICD-10-CM | POA: Diagnosis not present

## 2016-08-25 DIAGNOSIS — D631 Anemia in chronic kidney disease: Secondary | ICD-10-CM | POA: Diagnosis not present

## 2016-08-25 DIAGNOSIS — N186 End stage renal disease: Secondary | ICD-10-CM | POA: Diagnosis not present

## 2016-08-25 DIAGNOSIS — Z79899 Other long term (current) drug therapy: Secondary | ICD-10-CM | POA: Diagnosis not present

## 2016-08-25 DIAGNOSIS — N2581 Secondary hyperparathyroidism of renal origin: Secondary | ICD-10-CM | POA: Diagnosis not present

## 2016-08-25 DIAGNOSIS — E440 Moderate protein-calorie malnutrition: Secondary | ICD-10-CM | POA: Diagnosis not present

## 2016-08-26 DIAGNOSIS — D631 Anemia in chronic kidney disease: Secondary | ICD-10-CM | POA: Diagnosis not present

## 2016-08-26 DIAGNOSIS — D509 Iron deficiency anemia, unspecified: Secondary | ICD-10-CM | POA: Diagnosis not present

## 2016-08-26 DIAGNOSIS — E440 Moderate protein-calorie malnutrition: Secondary | ICD-10-CM | POA: Diagnosis not present

## 2016-08-26 DIAGNOSIS — N186 End stage renal disease: Secondary | ICD-10-CM | POA: Diagnosis not present

## 2016-08-26 DIAGNOSIS — Z79899 Other long term (current) drug therapy: Secondary | ICD-10-CM | POA: Diagnosis not present

## 2016-08-26 DIAGNOSIS — N2581 Secondary hyperparathyroidism of renal origin: Secondary | ICD-10-CM | POA: Diagnosis not present

## 2016-08-27 DIAGNOSIS — N186 End stage renal disease: Secondary | ICD-10-CM | POA: Diagnosis not present

## 2016-08-27 DIAGNOSIS — E440 Moderate protein-calorie malnutrition: Secondary | ICD-10-CM | POA: Diagnosis not present

## 2016-08-27 DIAGNOSIS — D509 Iron deficiency anemia, unspecified: Secondary | ICD-10-CM | POA: Diagnosis not present

## 2016-08-27 DIAGNOSIS — D631 Anemia in chronic kidney disease: Secondary | ICD-10-CM | POA: Diagnosis not present

## 2016-08-27 DIAGNOSIS — N2581 Secondary hyperparathyroidism of renal origin: Secondary | ICD-10-CM | POA: Diagnosis not present

## 2016-08-27 DIAGNOSIS — Z79899 Other long term (current) drug therapy: Secondary | ICD-10-CM | POA: Diagnosis not present

## 2016-08-28 DIAGNOSIS — D631 Anemia in chronic kidney disease: Secondary | ICD-10-CM | POA: Diagnosis not present

## 2016-08-28 DIAGNOSIS — D509 Iron deficiency anemia, unspecified: Secondary | ICD-10-CM | POA: Diagnosis not present

## 2016-08-28 DIAGNOSIS — Z79899 Other long term (current) drug therapy: Secondary | ICD-10-CM | POA: Diagnosis not present

## 2016-08-28 DIAGNOSIS — N186 End stage renal disease: Secondary | ICD-10-CM | POA: Diagnosis not present

## 2016-08-28 DIAGNOSIS — E440 Moderate protein-calorie malnutrition: Secondary | ICD-10-CM | POA: Diagnosis not present

## 2016-08-28 DIAGNOSIS — N2581 Secondary hyperparathyroidism of renal origin: Secondary | ICD-10-CM | POA: Diagnosis not present

## 2016-08-29 DIAGNOSIS — E440 Moderate protein-calorie malnutrition: Secondary | ICD-10-CM | POA: Diagnosis not present

## 2016-08-29 DIAGNOSIS — N186 End stage renal disease: Secondary | ICD-10-CM | POA: Diagnosis not present

## 2016-08-29 DIAGNOSIS — N2581 Secondary hyperparathyroidism of renal origin: Secondary | ICD-10-CM | POA: Diagnosis not present

## 2016-08-29 DIAGNOSIS — D631 Anemia in chronic kidney disease: Secondary | ICD-10-CM | POA: Diagnosis not present

## 2016-08-29 DIAGNOSIS — D509 Iron deficiency anemia, unspecified: Secondary | ICD-10-CM | POA: Diagnosis not present

## 2016-08-29 DIAGNOSIS — Z79899 Other long term (current) drug therapy: Secondary | ICD-10-CM | POA: Diagnosis not present

## 2016-08-30 DIAGNOSIS — E440 Moderate protein-calorie malnutrition: Secondary | ICD-10-CM | POA: Diagnosis not present

## 2016-08-30 DIAGNOSIS — D631 Anemia in chronic kidney disease: Secondary | ICD-10-CM | POA: Diagnosis not present

## 2016-08-30 DIAGNOSIS — Z79899 Other long term (current) drug therapy: Secondary | ICD-10-CM | POA: Diagnosis not present

## 2016-08-30 DIAGNOSIS — D509 Iron deficiency anemia, unspecified: Secondary | ICD-10-CM | POA: Diagnosis not present

## 2016-08-30 DIAGNOSIS — N186 End stage renal disease: Secondary | ICD-10-CM | POA: Diagnosis not present

## 2016-08-30 DIAGNOSIS — N2581 Secondary hyperparathyroidism of renal origin: Secondary | ICD-10-CM | POA: Diagnosis not present

## 2016-08-31 DIAGNOSIS — N2581 Secondary hyperparathyroidism of renal origin: Secondary | ICD-10-CM | POA: Diagnosis not present

## 2016-08-31 DIAGNOSIS — E440 Moderate protein-calorie malnutrition: Secondary | ICD-10-CM | POA: Diagnosis not present

## 2016-08-31 DIAGNOSIS — Z79899 Other long term (current) drug therapy: Secondary | ICD-10-CM | POA: Diagnosis not present

## 2016-08-31 DIAGNOSIS — N186 End stage renal disease: Secondary | ICD-10-CM | POA: Diagnosis not present

## 2016-08-31 DIAGNOSIS — D509 Iron deficiency anemia, unspecified: Secondary | ICD-10-CM | POA: Diagnosis not present

## 2016-08-31 DIAGNOSIS — D631 Anemia in chronic kidney disease: Secondary | ICD-10-CM | POA: Diagnosis not present

## 2016-09-01 DIAGNOSIS — N2581 Secondary hyperparathyroidism of renal origin: Secondary | ICD-10-CM | POA: Diagnosis not present

## 2016-09-01 DIAGNOSIS — D509 Iron deficiency anemia, unspecified: Secondary | ICD-10-CM | POA: Diagnosis not present

## 2016-09-01 DIAGNOSIS — N186 End stage renal disease: Secondary | ICD-10-CM | POA: Diagnosis not present

## 2016-09-01 DIAGNOSIS — Z79899 Other long term (current) drug therapy: Secondary | ICD-10-CM | POA: Diagnosis not present

## 2016-09-01 DIAGNOSIS — E440 Moderate protein-calorie malnutrition: Secondary | ICD-10-CM | POA: Diagnosis not present

## 2016-09-01 DIAGNOSIS — D631 Anemia in chronic kidney disease: Secondary | ICD-10-CM | POA: Diagnosis not present

## 2016-09-02 DIAGNOSIS — D631 Anemia in chronic kidney disease: Secondary | ICD-10-CM | POA: Diagnosis not present

## 2016-09-02 DIAGNOSIS — Z79899 Other long term (current) drug therapy: Secondary | ICD-10-CM | POA: Diagnosis not present

## 2016-09-02 DIAGNOSIS — N2581 Secondary hyperparathyroidism of renal origin: Secondary | ICD-10-CM | POA: Diagnosis not present

## 2016-09-02 DIAGNOSIS — N186 End stage renal disease: Secondary | ICD-10-CM | POA: Diagnosis not present

## 2016-09-02 DIAGNOSIS — E440 Moderate protein-calorie malnutrition: Secondary | ICD-10-CM | POA: Diagnosis not present

## 2016-09-02 DIAGNOSIS — D509 Iron deficiency anemia, unspecified: Secondary | ICD-10-CM | POA: Diagnosis not present

## 2016-09-03 DIAGNOSIS — D631 Anemia in chronic kidney disease: Secondary | ICD-10-CM | POA: Diagnosis not present

## 2016-09-03 DIAGNOSIS — N2581 Secondary hyperparathyroidism of renal origin: Secondary | ICD-10-CM | POA: Diagnosis not present

## 2016-09-03 DIAGNOSIS — N2889 Other specified disorders of kidney and ureter: Secondary | ICD-10-CM | POA: Diagnosis not present

## 2016-09-03 DIAGNOSIS — N186 End stage renal disease: Secondary | ICD-10-CM | POA: Diagnosis not present

## 2016-09-03 DIAGNOSIS — E440 Moderate protein-calorie malnutrition: Secondary | ICD-10-CM | POA: Diagnosis not present

## 2016-09-03 DIAGNOSIS — D509 Iron deficiency anemia, unspecified: Secondary | ICD-10-CM | POA: Diagnosis not present

## 2016-09-03 DIAGNOSIS — Z79899 Other long term (current) drug therapy: Secondary | ICD-10-CM | POA: Diagnosis not present

## 2016-09-03 DIAGNOSIS — Z992 Dependence on renal dialysis: Secondary | ICD-10-CM | POA: Diagnosis not present

## 2016-09-04 DIAGNOSIS — K769 Liver disease, unspecified: Secondary | ICD-10-CM | POA: Diagnosis not present

## 2016-09-04 DIAGNOSIS — D509 Iron deficiency anemia, unspecified: Secondary | ICD-10-CM | POA: Diagnosis not present

## 2016-09-04 DIAGNOSIS — N186 End stage renal disease: Secondary | ICD-10-CM | POA: Diagnosis not present

## 2016-09-04 DIAGNOSIS — D631 Anemia in chronic kidney disease: Secondary | ICD-10-CM | POA: Diagnosis not present

## 2016-09-04 DIAGNOSIS — Z4932 Encounter for adequacy testing for peritoneal dialysis: Secondary | ICD-10-CM | POA: Diagnosis not present

## 2016-09-04 DIAGNOSIS — N2589 Other disorders resulting from impaired renal tubular function: Secondary | ICD-10-CM | POA: Diagnosis not present

## 2016-09-04 DIAGNOSIS — N2581 Secondary hyperparathyroidism of renal origin: Secondary | ICD-10-CM | POA: Diagnosis not present

## 2016-09-05 DIAGNOSIS — D631 Anemia in chronic kidney disease: Secondary | ICD-10-CM | POA: Diagnosis not present

## 2016-09-05 DIAGNOSIS — N2589 Other disorders resulting from impaired renal tubular function: Secondary | ICD-10-CM | POA: Diagnosis not present

## 2016-09-05 DIAGNOSIS — E784 Other hyperlipidemia: Secondary | ICD-10-CM | POA: Diagnosis not present

## 2016-09-05 DIAGNOSIS — N2581 Secondary hyperparathyroidism of renal origin: Secondary | ICD-10-CM | POA: Diagnosis not present

## 2016-09-05 DIAGNOSIS — K769 Liver disease, unspecified: Secondary | ICD-10-CM | POA: Diagnosis not present

## 2016-09-05 DIAGNOSIS — D509 Iron deficiency anemia, unspecified: Secondary | ICD-10-CM | POA: Diagnosis not present

## 2016-09-05 DIAGNOSIS — N186 End stage renal disease: Secondary | ICD-10-CM | POA: Diagnosis not present

## 2016-09-05 DIAGNOSIS — R8299 Other abnormal findings in urine: Secondary | ICD-10-CM | POA: Diagnosis not present

## 2016-09-06 DIAGNOSIS — N2581 Secondary hyperparathyroidism of renal origin: Secondary | ICD-10-CM | POA: Diagnosis not present

## 2016-09-06 DIAGNOSIS — N2589 Other disorders resulting from impaired renal tubular function: Secondary | ICD-10-CM | POA: Diagnosis not present

## 2016-09-06 DIAGNOSIS — D509 Iron deficiency anemia, unspecified: Secondary | ICD-10-CM | POA: Diagnosis not present

## 2016-09-06 DIAGNOSIS — N186 End stage renal disease: Secondary | ICD-10-CM | POA: Diagnosis not present

## 2016-09-06 DIAGNOSIS — D631 Anemia in chronic kidney disease: Secondary | ICD-10-CM | POA: Diagnosis not present

## 2016-09-06 DIAGNOSIS — K769 Liver disease, unspecified: Secondary | ICD-10-CM | POA: Diagnosis not present

## 2016-09-07 DIAGNOSIS — N186 End stage renal disease: Secondary | ICD-10-CM | POA: Diagnosis not present

## 2016-09-07 DIAGNOSIS — D631 Anemia in chronic kidney disease: Secondary | ICD-10-CM | POA: Diagnosis not present

## 2016-09-07 DIAGNOSIS — K769 Liver disease, unspecified: Secondary | ICD-10-CM | POA: Diagnosis not present

## 2016-09-07 DIAGNOSIS — N2581 Secondary hyperparathyroidism of renal origin: Secondary | ICD-10-CM | POA: Diagnosis not present

## 2016-09-07 DIAGNOSIS — N2589 Other disorders resulting from impaired renal tubular function: Secondary | ICD-10-CM | POA: Diagnosis not present

## 2016-09-07 DIAGNOSIS — D509 Iron deficiency anemia, unspecified: Secondary | ICD-10-CM | POA: Diagnosis not present

## 2016-09-08 DIAGNOSIS — N2581 Secondary hyperparathyroidism of renal origin: Secondary | ICD-10-CM | POA: Diagnosis not present

## 2016-09-08 DIAGNOSIS — D509 Iron deficiency anemia, unspecified: Secondary | ICD-10-CM | POA: Diagnosis not present

## 2016-09-08 DIAGNOSIS — D631 Anemia in chronic kidney disease: Secondary | ICD-10-CM | POA: Diagnosis not present

## 2016-09-08 DIAGNOSIS — K769 Liver disease, unspecified: Secondary | ICD-10-CM | POA: Diagnosis not present

## 2016-09-08 DIAGNOSIS — N2589 Other disorders resulting from impaired renal tubular function: Secondary | ICD-10-CM | POA: Diagnosis not present

## 2016-09-08 DIAGNOSIS — N186 End stage renal disease: Secondary | ICD-10-CM | POA: Diagnosis not present

## 2016-09-09 DIAGNOSIS — N2581 Secondary hyperparathyroidism of renal origin: Secondary | ICD-10-CM | POA: Diagnosis not present

## 2016-09-09 DIAGNOSIS — K769 Liver disease, unspecified: Secondary | ICD-10-CM | POA: Diagnosis not present

## 2016-09-09 DIAGNOSIS — N2589 Other disorders resulting from impaired renal tubular function: Secondary | ICD-10-CM | POA: Diagnosis not present

## 2016-09-09 DIAGNOSIS — D631 Anemia in chronic kidney disease: Secondary | ICD-10-CM | POA: Diagnosis not present

## 2016-09-09 DIAGNOSIS — D509 Iron deficiency anemia, unspecified: Secondary | ICD-10-CM | POA: Diagnosis not present

## 2016-09-09 DIAGNOSIS — N186 End stage renal disease: Secondary | ICD-10-CM | POA: Diagnosis not present

## 2016-09-10 DIAGNOSIS — K769 Liver disease, unspecified: Secondary | ICD-10-CM | POA: Diagnosis not present

## 2016-09-10 DIAGNOSIS — N186 End stage renal disease: Secondary | ICD-10-CM | POA: Diagnosis not present

## 2016-09-10 DIAGNOSIS — N2581 Secondary hyperparathyroidism of renal origin: Secondary | ICD-10-CM | POA: Diagnosis not present

## 2016-09-10 DIAGNOSIS — D631 Anemia in chronic kidney disease: Secondary | ICD-10-CM | POA: Diagnosis not present

## 2016-09-10 DIAGNOSIS — N2589 Other disorders resulting from impaired renal tubular function: Secondary | ICD-10-CM | POA: Diagnosis not present

## 2016-09-10 DIAGNOSIS — D509 Iron deficiency anemia, unspecified: Secondary | ICD-10-CM | POA: Diagnosis not present

## 2016-09-11 DIAGNOSIS — D509 Iron deficiency anemia, unspecified: Secondary | ICD-10-CM | POA: Diagnosis not present

## 2016-09-11 DIAGNOSIS — N2581 Secondary hyperparathyroidism of renal origin: Secondary | ICD-10-CM | POA: Diagnosis not present

## 2016-09-11 DIAGNOSIS — N2589 Other disorders resulting from impaired renal tubular function: Secondary | ICD-10-CM | POA: Diagnosis not present

## 2016-09-11 DIAGNOSIS — N186 End stage renal disease: Secondary | ICD-10-CM | POA: Diagnosis not present

## 2016-09-11 DIAGNOSIS — K769 Liver disease, unspecified: Secondary | ICD-10-CM | POA: Diagnosis not present

## 2016-09-11 DIAGNOSIS — D631 Anemia in chronic kidney disease: Secondary | ICD-10-CM | POA: Diagnosis not present

## 2016-09-12 DIAGNOSIS — N2589 Other disorders resulting from impaired renal tubular function: Secondary | ICD-10-CM | POA: Diagnosis not present

## 2016-09-12 DIAGNOSIS — D631 Anemia in chronic kidney disease: Secondary | ICD-10-CM | POA: Diagnosis not present

## 2016-09-12 DIAGNOSIS — K769 Liver disease, unspecified: Secondary | ICD-10-CM | POA: Diagnosis not present

## 2016-09-12 DIAGNOSIS — N186 End stage renal disease: Secondary | ICD-10-CM | POA: Diagnosis not present

## 2016-09-12 DIAGNOSIS — N2581 Secondary hyperparathyroidism of renal origin: Secondary | ICD-10-CM | POA: Diagnosis not present

## 2016-09-12 DIAGNOSIS — D509 Iron deficiency anemia, unspecified: Secondary | ICD-10-CM | POA: Diagnosis not present

## 2016-09-13 DIAGNOSIS — N186 End stage renal disease: Secondary | ICD-10-CM | POA: Diagnosis not present

## 2016-09-13 DIAGNOSIS — N2589 Other disorders resulting from impaired renal tubular function: Secondary | ICD-10-CM | POA: Diagnosis not present

## 2016-09-13 DIAGNOSIS — N2581 Secondary hyperparathyroidism of renal origin: Secondary | ICD-10-CM | POA: Diagnosis not present

## 2016-09-13 DIAGNOSIS — D509 Iron deficiency anemia, unspecified: Secondary | ICD-10-CM | POA: Diagnosis not present

## 2016-09-13 DIAGNOSIS — K769 Liver disease, unspecified: Secondary | ICD-10-CM | POA: Diagnosis not present

## 2016-09-13 DIAGNOSIS — D631 Anemia in chronic kidney disease: Secondary | ICD-10-CM | POA: Diagnosis not present

## 2016-09-14 DIAGNOSIS — D509 Iron deficiency anemia, unspecified: Secondary | ICD-10-CM | POA: Diagnosis not present

## 2016-09-14 DIAGNOSIS — N2589 Other disorders resulting from impaired renal tubular function: Secondary | ICD-10-CM | POA: Diagnosis not present

## 2016-09-14 DIAGNOSIS — K769 Liver disease, unspecified: Secondary | ICD-10-CM | POA: Diagnosis not present

## 2016-09-14 DIAGNOSIS — N186 End stage renal disease: Secondary | ICD-10-CM | POA: Diagnosis not present

## 2016-09-14 DIAGNOSIS — D631 Anemia in chronic kidney disease: Secondary | ICD-10-CM | POA: Diagnosis not present

## 2016-09-14 DIAGNOSIS — N2581 Secondary hyperparathyroidism of renal origin: Secondary | ICD-10-CM | POA: Diagnosis not present

## 2016-09-15 DIAGNOSIS — N2589 Other disorders resulting from impaired renal tubular function: Secondary | ICD-10-CM | POA: Diagnosis not present

## 2016-09-15 DIAGNOSIS — D509 Iron deficiency anemia, unspecified: Secondary | ICD-10-CM | POA: Diagnosis not present

## 2016-09-15 DIAGNOSIS — N2581 Secondary hyperparathyroidism of renal origin: Secondary | ICD-10-CM | POA: Diagnosis not present

## 2016-09-15 DIAGNOSIS — D631 Anemia in chronic kidney disease: Secondary | ICD-10-CM | POA: Diagnosis not present

## 2016-09-15 DIAGNOSIS — N186 End stage renal disease: Secondary | ICD-10-CM | POA: Diagnosis not present

## 2016-09-15 DIAGNOSIS — K769 Liver disease, unspecified: Secondary | ICD-10-CM | POA: Diagnosis not present

## 2016-09-16 DIAGNOSIS — N2589 Other disorders resulting from impaired renal tubular function: Secondary | ICD-10-CM | POA: Diagnosis not present

## 2016-09-16 DIAGNOSIS — N186 End stage renal disease: Secondary | ICD-10-CM | POA: Diagnosis not present

## 2016-09-16 DIAGNOSIS — N2581 Secondary hyperparathyroidism of renal origin: Secondary | ICD-10-CM | POA: Diagnosis not present

## 2016-09-16 DIAGNOSIS — K769 Liver disease, unspecified: Secondary | ICD-10-CM | POA: Diagnosis not present

## 2016-09-16 DIAGNOSIS — D509 Iron deficiency anemia, unspecified: Secondary | ICD-10-CM | POA: Diagnosis not present

## 2016-09-16 DIAGNOSIS — D631 Anemia in chronic kidney disease: Secondary | ICD-10-CM | POA: Diagnosis not present

## 2016-09-17 DIAGNOSIS — D631 Anemia in chronic kidney disease: Secondary | ICD-10-CM | POA: Diagnosis not present

## 2016-09-17 DIAGNOSIS — N2589 Other disorders resulting from impaired renal tubular function: Secondary | ICD-10-CM | POA: Diagnosis not present

## 2016-09-17 DIAGNOSIS — K769 Liver disease, unspecified: Secondary | ICD-10-CM | POA: Diagnosis not present

## 2016-09-17 DIAGNOSIS — N186 End stage renal disease: Secondary | ICD-10-CM | POA: Diagnosis not present

## 2016-09-17 DIAGNOSIS — N2581 Secondary hyperparathyroidism of renal origin: Secondary | ICD-10-CM | POA: Diagnosis not present

## 2016-09-17 DIAGNOSIS — D509 Iron deficiency anemia, unspecified: Secondary | ICD-10-CM | POA: Diagnosis not present

## 2016-09-18 DIAGNOSIS — N2581 Secondary hyperparathyroidism of renal origin: Secondary | ICD-10-CM | POA: Diagnosis not present

## 2016-09-18 DIAGNOSIS — K769 Liver disease, unspecified: Secondary | ICD-10-CM | POA: Diagnosis not present

## 2016-09-18 DIAGNOSIS — D509 Iron deficiency anemia, unspecified: Secondary | ICD-10-CM | POA: Diagnosis not present

## 2016-09-18 DIAGNOSIS — N2589 Other disorders resulting from impaired renal tubular function: Secondary | ICD-10-CM | POA: Diagnosis not present

## 2016-09-18 DIAGNOSIS — D631 Anemia in chronic kidney disease: Secondary | ICD-10-CM | POA: Diagnosis not present

## 2016-09-18 DIAGNOSIS — N186 End stage renal disease: Secondary | ICD-10-CM | POA: Diagnosis not present

## 2016-09-19 DIAGNOSIS — D631 Anemia in chronic kidney disease: Secondary | ICD-10-CM | POA: Diagnosis not present

## 2016-09-19 DIAGNOSIS — N2589 Other disorders resulting from impaired renal tubular function: Secondary | ICD-10-CM | POA: Diagnosis not present

## 2016-09-19 DIAGNOSIS — K769 Liver disease, unspecified: Secondary | ICD-10-CM | POA: Diagnosis not present

## 2016-09-19 DIAGNOSIS — D509 Iron deficiency anemia, unspecified: Secondary | ICD-10-CM | POA: Diagnosis not present

## 2016-09-19 DIAGNOSIS — N186 End stage renal disease: Secondary | ICD-10-CM | POA: Diagnosis not present

## 2016-09-19 DIAGNOSIS — N2581 Secondary hyperparathyroidism of renal origin: Secondary | ICD-10-CM | POA: Diagnosis not present

## 2016-09-20 DIAGNOSIS — D631 Anemia in chronic kidney disease: Secondary | ICD-10-CM | POA: Diagnosis not present

## 2016-09-20 DIAGNOSIS — K769 Liver disease, unspecified: Secondary | ICD-10-CM | POA: Diagnosis not present

## 2016-09-20 DIAGNOSIS — N186 End stage renal disease: Secondary | ICD-10-CM | POA: Diagnosis not present

## 2016-09-20 DIAGNOSIS — N2581 Secondary hyperparathyroidism of renal origin: Secondary | ICD-10-CM | POA: Diagnosis not present

## 2016-09-20 DIAGNOSIS — D509 Iron deficiency anemia, unspecified: Secondary | ICD-10-CM | POA: Diagnosis not present

## 2016-09-20 DIAGNOSIS — N2589 Other disorders resulting from impaired renal tubular function: Secondary | ICD-10-CM | POA: Diagnosis not present

## 2016-09-21 DIAGNOSIS — D631 Anemia in chronic kidney disease: Secondary | ICD-10-CM | POA: Diagnosis not present

## 2016-09-21 DIAGNOSIS — K769 Liver disease, unspecified: Secondary | ICD-10-CM | POA: Diagnosis not present

## 2016-09-21 DIAGNOSIS — D509 Iron deficiency anemia, unspecified: Secondary | ICD-10-CM | POA: Diagnosis not present

## 2016-09-21 DIAGNOSIS — N2581 Secondary hyperparathyroidism of renal origin: Secondary | ICD-10-CM | POA: Diagnosis not present

## 2016-09-21 DIAGNOSIS — N186 End stage renal disease: Secondary | ICD-10-CM | POA: Diagnosis not present

## 2016-09-21 DIAGNOSIS — N2589 Other disorders resulting from impaired renal tubular function: Secondary | ICD-10-CM | POA: Diagnosis not present

## 2016-09-22 DIAGNOSIS — N2589 Other disorders resulting from impaired renal tubular function: Secondary | ICD-10-CM | POA: Diagnosis not present

## 2016-09-22 DIAGNOSIS — D509 Iron deficiency anemia, unspecified: Secondary | ICD-10-CM | POA: Diagnosis not present

## 2016-09-22 DIAGNOSIS — N186 End stage renal disease: Secondary | ICD-10-CM | POA: Diagnosis not present

## 2016-09-22 DIAGNOSIS — N2581 Secondary hyperparathyroidism of renal origin: Secondary | ICD-10-CM | POA: Diagnosis not present

## 2016-09-22 DIAGNOSIS — K769 Liver disease, unspecified: Secondary | ICD-10-CM | POA: Diagnosis not present

## 2016-09-22 DIAGNOSIS — D631 Anemia in chronic kidney disease: Secondary | ICD-10-CM | POA: Diagnosis not present

## 2016-09-23 DIAGNOSIS — D509 Iron deficiency anemia, unspecified: Secondary | ICD-10-CM | POA: Diagnosis not present

## 2016-09-23 DIAGNOSIS — N2589 Other disorders resulting from impaired renal tubular function: Secondary | ICD-10-CM | POA: Diagnosis not present

## 2016-09-23 DIAGNOSIS — N186 End stage renal disease: Secondary | ICD-10-CM | POA: Diagnosis not present

## 2016-09-23 DIAGNOSIS — K769 Liver disease, unspecified: Secondary | ICD-10-CM | POA: Diagnosis not present

## 2016-09-23 DIAGNOSIS — D631 Anemia in chronic kidney disease: Secondary | ICD-10-CM | POA: Diagnosis not present

## 2016-09-23 DIAGNOSIS — N2581 Secondary hyperparathyroidism of renal origin: Secondary | ICD-10-CM | POA: Diagnosis not present

## 2016-09-24 DIAGNOSIS — D631 Anemia in chronic kidney disease: Secondary | ICD-10-CM | POA: Diagnosis not present

## 2016-09-24 DIAGNOSIS — D509 Iron deficiency anemia, unspecified: Secondary | ICD-10-CM | POA: Diagnosis not present

## 2016-09-24 DIAGNOSIS — N2589 Other disorders resulting from impaired renal tubular function: Secondary | ICD-10-CM | POA: Diagnosis not present

## 2016-09-24 DIAGNOSIS — N186 End stage renal disease: Secondary | ICD-10-CM | POA: Diagnosis not present

## 2016-09-24 DIAGNOSIS — N2581 Secondary hyperparathyroidism of renal origin: Secondary | ICD-10-CM | POA: Diagnosis not present

## 2016-09-24 DIAGNOSIS — K769 Liver disease, unspecified: Secondary | ICD-10-CM | POA: Diagnosis not present

## 2016-09-25 DIAGNOSIS — D509 Iron deficiency anemia, unspecified: Secondary | ICD-10-CM | POA: Diagnosis not present

## 2016-09-25 DIAGNOSIS — N186 End stage renal disease: Secondary | ICD-10-CM | POA: Diagnosis not present

## 2016-09-25 DIAGNOSIS — N2581 Secondary hyperparathyroidism of renal origin: Secondary | ICD-10-CM | POA: Diagnosis not present

## 2016-09-25 DIAGNOSIS — K769 Liver disease, unspecified: Secondary | ICD-10-CM | POA: Diagnosis not present

## 2016-09-25 DIAGNOSIS — D631 Anemia in chronic kidney disease: Secondary | ICD-10-CM | POA: Diagnosis not present

## 2016-09-25 DIAGNOSIS — N2589 Other disorders resulting from impaired renal tubular function: Secondary | ICD-10-CM | POA: Diagnosis not present

## 2016-09-26 DIAGNOSIS — N2581 Secondary hyperparathyroidism of renal origin: Secondary | ICD-10-CM | POA: Diagnosis not present

## 2016-09-26 DIAGNOSIS — N2589 Other disorders resulting from impaired renal tubular function: Secondary | ICD-10-CM | POA: Diagnosis not present

## 2016-09-26 DIAGNOSIS — N186 End stage renal disease: Secondary | ICD-10-CM | POA: Diagnosis not present

## 2016-09-26 DIAGNOSIS — D509 Iron deficiency anemia, unspecified: Secondary | ICD-10-CM | POA: Diagnosis not present

## 2016-09-26 DIAGNOSIS — D631 Anemia in chronic kidney disease: Secondary | ICD-10-CM | POA: Diagnosis not present

## 2016-09-26 DIAGNOSIS — K769 Liver disease, unspecified: Secondary | ICD-10-CM | POA: Diagnosis not present

## 2016-09-27 DIAGNOSIS — N2581 Secondary hyperparathyroidism of renal origin: Secondary | ICD-10-CM | POA: Diagnosis not present

## 2016-09-27 DIAGNOSIS — K769 Liver disease, unspecified: Secondary | ICD-10-CM | POA: Diagnosis not present

## 2016-09-27 DIAGNOSIS — N186 End stage renal disease: Secondary | ICD-10-CM | POA: Diagnosis not present

## 2016-09-27 DIAGNOSIS — D509 Iron deficiency anemia, unspecified: Secondary | ICD-10-CM | POA: Diagnosis not present

## 2016-09-27 DIAGNOSIS — N2589 Other disorders resulting from impaired renal tubular function: Secondary | ICD-10-CM | POA: Diagnosis not present

## 2016-09-27 DIAGNOSIS — D631 Anemia in chronic kidney disease: Secondary | ICD-10-CM | POA: Diagnosis not present

## 2016-09-28 DIAGNOSIS — D509 Iron deficiency anemia, unspecified: Secondary | ICD-10-CM | POA: Diagnosis not present

## 2016-09-28 DIAGNOSIS — N2581 Secondary hyperparathyroidism of renal origin: Secondary | ICD-10-CM | POA: Diagnosis not present

## 2016-09-28 DIAGNOSIS — K769 Liver disease, unspecified: Secondary | ICD-10-CM | POA: Diagnosis not present

## 2016-09-28 DIAGNOSIS — D631 Anemia in chronic kidney disease: Secondary | ICD-10-CM | POA: Diagnosis not present

## 2016-09-28 DIAGNOSIS — N2589 Other disorders resulting from impaired renal tubular function: Secondary | ICD-10-CM | POA: Diagnosis not present

## 2016-09-28 DIAGNOSIS — N186 End stage renal disease: Secondary | ICD-10-CM | POA: Diagnosis not present

## 2016-09-29 DIAGNOSIS — N2589 Other disorders resulting from impaired renal tubular function: Secondary | ICD-10-CM | POA: Diagnosis not present

## 2016-09-29 DIAGNOSIS — D509 Iron deficiency anemia, unspecified: Secondary | ICD-10-CM | POA: Diagnosis not present

## 2016-09-29 DIAGNOSIS — D631 Anemia in chronic kidney disease: Secondary | ICD-10-CM | POA: Diagnosis not present

## 2016-09-29 DIAGNOSIS — N2581 Secondary hyperparathyroidism of renal origin: Secondary | ICD-10-CM | POA: Diagnosis not present

## 2016-09-29 DIAGNOSIS — K769 Liver disease, unspecified: Secondary | ICD-10-CM | POA: Diagnosis not present

## 2016-09-29 DIAGNOSIS — N186 End stage renal disease: Secondary | ICD-10-CM | POA: Diagnosis not present

## 2016-09-30 DIAGNOSIS — N2589 Other disorders resulting from impaired renal tubular function: Secondary | ICD-10-CM | POA: Diagnosis not present

## 2016-09-30 DIAGNOSIS — N2581 Secondary hyperparathyroidism of renal origin: Secondary | ICD-10-CM | POA: Diagnosis not present

## 2016-09-30 DIAGNOSIS — K769 Liver disease, unspecified: Secondary | ICD-10-CM | POA: Diagnosis not present

## 2016-09-30 DIAGNOSIS — D509 Iron deficiency anemia, unspecified: Secondary | ICD-10-CM | POA: Diagnosis not present

## 2016-09-30 DIAGNOSIS — N186 End stage renal disease: Secondary | ICD-10-CM | POA: Diagnosis not present

## 2016-09-30 DIAGNOSIS — D631 Anemia in chronic kidney disease: Secondary | ICD-10-CM | POA: Diagnosis not present

## 2016-10-01 DIAGNOSIS — K769 Liver disease, unspecified: Secondary | ICD-10-CM | POA: Diagnosis not present

## 2016-10-01 DIAGNOSIS — D509 Iron deficiency anemia, unspecified: Secondary | ICD-10-CM | POA: Diagnosis not present

## 2016-10-01 DIAGNOSIS — N2589 Other disorders resulting from impaired renal tubular function: Secondary | ICD-10-CM | POA: Diagnosis not present

## 2016-10-01 DIAGNOSIS — N2581 Secondary hyperparathyroidism of renal origin: Secondary | ICD-10-CM | POA: Diagnosis not present

## 2016-10-01 DIAGNOSIS — N186 End stage renal disease: Secondary | ICD-10-CM | POA: Diagnosis not present

## 2016-10-01 DIAGNOSIS — D631 Anemia in chronic kidney disease: Secondary | ICD-10-CM | POA: Diagnosis not present

## 2016-10-02 DIAGNOSIS — N2589 Other disorders resulting from impaired renal tubular function: Secondary | ICD-10-CM | POA: Diagnosis not present

## 2016-10-02 DIAGNOSIS — N2581 Secondary hyperparathyroidism of renal origin: Secondary | ICD-10-CM | POA: Diagnosis not present

## 2016-10-02 DIAGNOSIS — K769 Liver disease, unspecified: Secondary | ICD-10-CM | POA: Diagnosis not present

## 2016-10-02 DIAGNOSIS — D509 Iron deficiency anemia, unspecified: Secondary | ICD-10-CM | POA: Diagnosis not present

## 2016-10-02 DIAGNOSIS — D631 Anemia in chronic kidney disease: Secondary | ICD-10-CM | POA: Diagnosis not present

## 2016-10-02 DIAGNOSIS — N186 End stage renal disease: Secondary | ICD-10-CM | POA: Diagnosis not present

## 2016-10-03 DIAGNOSIS — N2589 Other disorders resulting from impaired renal tubular function: Secondary | ICD-10-CM | POA: Diagnosis not present

## 2016-10-03 DIAGNOSIS — K769 Liver disease, unspecified: Secondary | ICD-10-CM | POA: Diagnosis not present

## 2016-10-03 DIAGNOSIS — D631 Anemia in chronic kidney disease: Secondary | ICD-10-CM | POA: Diagnosis not present

## 2016-10-03 DIAGNOSIS — I120 Hypertensive chronic kidney disease with stage 5 chronic kidney disease or end stage renal disease: Secondary | ICD-10-CM | POA: Diagnosis not present

## 2016-10-03 DIAGNOSIS — E039 Hypothyroidism, unspecified: Secondary | ICD-10-CM | POA: Diagnosis not present

## 2016-10-03 DIAGNOSIS — N2581 Secondary hyperparathyroidism of renal origin: Secondary | ICD-10-CM | POA: Diagnosis not present

## 2016-10-03 DIAGNOSIS — Z992 Dependence on renal dialysis: Secondary | ICD-10-CM | POA: Diagnosis not present

## 2016-10-03 DIAGNOSIS — N186 End stage renal disease: Secondary | ICD-10-CM | POA: Diagnosis not present

## 2016-10-03 DIAGNOSIS — D509 Iron deficiency anemia, unspecified: Secondary | ICD-10-CM | POA: Diagnosis not present

## 2016-10-04 DIAGNOSIS — Z992 Dependence on renal dialysis: Secondary | ICD-10-CM | POA: Diagnosis not present

## 2016-10-04 DIAGNOSIS — D631 Anemia in chronic kidney disease: Secondary | ICD-10-CM | POA: Diagnosis not present

## 2016-10-04 DIAGNOSIS — N2889 Other specified disorders of kidney and ureter: Secondary | ICD-10-CM | POA: Diagnosis not present

## 2016-10-04 DIAGNOSIS — N2581 Secondary hyperparathyroidism of renal origin: Secondary | ICD-10-CM | POA: Diagnosis not present

## 2016-10-04 DIAGNOSIS — N2589 Other disorders resulting from impaired renal tubular function: Secondary | ICD-10-CM | POA: Diagnosis not present

## 2016-10-04 DIAGNOSIS — K769 Liver disease, unspecified: Secondary | ICD-10-CM | POA: Diagnosis not present

## 2016-10-04 DIAGNOSIS — D509 Iron deficiency anemia, unspecified: Secondary | ICD-10-CM | POA: Diagnosis not present

## 2016-10-04 DIAGNOSIS — N186 End stage renal disease: Secondary | ICD-10-CM | POA: Diagnosis not present

## 2016-10-05 DIAGNOSIS — N2581 Secondary hyperparathyroidism of renal origin: Secondary | ICD-10-CM | POA: Diagnosis not present

## 2016-10-05 DIAGNOSIS — D631 Anemia in chronic kidney disease: Secondary | ICD-10-CM | POA: Diagnosis not present

## 2016-10-05 DIAGNOSIS — Z79899 Other long term (current) drug therapy: Secondary | ICD-10-CM | POA: Diagnosis not present

## 2016-10-05 DIAGNOSIS — N186 End stage renal disease: Secondary | ICD-10-CM | POA: Diagnosis not present

## 2016-10-05 DIAGNOSIS — E440 Moderate protein-calorie malnutrition: Secondary | ICD-10-CM | POA: Diagnosis not present

## 2016-10-05 DIAGNOSIS — D509 Iron deficiency anemia, unspecified: Secondary | ICD-10-CM | POA: Diagnosis not present

## 2016-10-06 DIAGNOSIS — D631 Anemia in chronic kidney disease: Secondary | ICD-10-CM | POA: Diagnosis not present

## 2016-10-06 DIAGNOSIS — N2581 Secondary hyperparathyroidism of renal origin: Secondary | ICD-10-CM | POA: Diagnosis not present

## 2016-10-06 DIAGNOSIS — Z79899 Other long term (current) drug therapy: Secondary | ICD-10-CM | POA: Diagnosis not present

## 2016-10-06 DIAGNOSIS — N186 End stage renal disease: Secondary | ICD-10-CM | POA: Diagnosis not present

## 2016-10-06 DIAGNOSIS — D509 Iron deficiency anemia, unspecified: Secondary | ICD-10-CM | POA: Diagnosis not present

## 2016-10-06 DIAGNOSIS — E440 Moderate protein-calorie malnutrition: Secondary | ICD-10-CM | POA: Diagnosis not present

## 2016-10-07 DIAGNOSIS — D509 Iron deficiency anemia, unspecified: Secondary | ICD-10-CM | POA: Diagnosis not present

## 2016-10-07 DIAGNOSIS — N186 End stage renal disease: Secondary | ICD-10-CM | POA: Diagnosis not present

## 2016-10-07 DIAGNOSIS — N2581 Secondary hyperparathyroidism of renal origin: Secondary | ICD-10-CM | POA: Diagnosis not present

## 2016-10-07 DIAGNOSIS — Z79899 Other long term (current) drug therapy: Secondary | ICD-10-CM | POA: Diagnosis not present

## 2016-10-07 DIAGNOSIS — D631 Anemia in chronic kidney disease: Secondary | ICD-10-CM | POA: Diagnosis not present

## 2016-10-07 DIAGNOSIS — E440 Moderate protein-calorie malnutrition: Secondary | ICD-10-CM | POA: Diagnosis not present

## 2016-10-08 DIAGNOSIS — D509 Iron deficiency anemia, unspecified: Secondary | ICD-10-CM | POA: Diagnosis not present

## 2016-10-08 DIAGNOSIS — N2581 Secondary hyperparathyroidism of renal origin: Secondary | ICD-10-CM | POA: Diagnosis not present

## 2016-10-08 DIAGNOSIS — Z79899 Other long term (current) drug therapy: Secondary | ICD-10-CM | POA: Diagnosis not present

## 2016-10-08 DIAGNOSIS — N186 End stage renal disease: Secondary | ICD-10-CM | POA: Diagnosis not present

## 2016-10-08 DIAGNOSIS — E440 Moderate protein-calorie malnutrition: Secondary | ICD-10-CM | POA: Diagnosis not present

## 2016-10-08 DIAGNOSIS — D631 Anemia in chronic kidney disease: Secondary | ICD-10-CM | POA: Diagnosis not present

## 2016-10-09 DIAGNOSIS — Z79899 Other long term (current) drug therapy: Secondary | ICD-10-CM | POA: Diagnosis not present

## 2016-10-09 DIAGNOSIS — Z7682 Awaiting organ transplant status: Secondary | ICD-10-CM | POA: Diagnosis not present

## 2016-10-09 DIAGNOSIS — N186 End stage renal disease: Secondary | ICD-10-CM | POA: Diagnosis not present

## 2016-10-09 DIAGNOSIS — E440 Moderate protein-calorie malnutrition: Secondary | ICD-10-CM | POA: Diagnosis not present

## 2016-10-09 DIAGNOSIS — D631 Anemia in chronic kidney disease: Secondary | ICD-10-CM | POA: Diagnosis not present

## 2016-10-09 DIAGNOSIS — D509 Iron deficiency anemia, unspecified: Secondary | ICD-10-CM | POA: Diagnosis not present

## 2016-10-09 DIAGNOSIS — N2581 Secondary hyperparathyroidism of renal origin: Secondary | ICD-10-CM | POA: Diagnosis not present

## 2016-10-10 DIAGNOSIS — Z79899 Other long term (current) drug therapy: Secondary | ICD-10-CM | POA: Diagnosis not present

## 2016-10-10 DIAGNOSIS — E440 Moderate protein-calorie malnutrition: Secondary | ICD-10-CM | POA: Diagnosis not present

## 2016-10-10 DIAGNOSIS — D631 Anemia in chronic kidney disease: Secondary | ICD-10-CM | POA: Diagnosis not present

## 2016-10-10 DIAGNOSIS — N2581 Secondary hyperparathyroidism of renal origin: Secondary | ICD-10-CM | POA: Diagnosis not present

## 2016-10-10 DIAGNOSIS — D509 Iron deficiency anemia, unspecified: Secondary | ICD-10-CM | POA: Diagnosis not present

## 2016-10-10 DIAGNOSIS — N186 End stage renal disease: Secondary | ICD-10-CM | POA: Diagnosis not present

## 2016-10-11 DIAGNOSIS — E440 Moderate protein-calorie malnutrition: Secondary | ICD-10-CM | POA: Diagnosis not present

## 2016-10-11 DIAGNOSIS — N2581 Secondary hyperparathyroidism of renal origin: Secondary | ICD-10-CM | POA: Diagnosis not present

## 2016-10-11 DIAGNOSIS — N186 End stage renal disease: Secondary | ICD-10-CM | POA: Diagnosis not present

## 2016-10-11 DIAGNOSIS — D509 Iron deficiency anemia, unspecified: Secondary | ICD-10-CM | POA: Diagnosis not present

## 2016-10-11 DIAGNOSIS — D631 Anemia in chronic kidney disease: Secondary | ICD-10-CM | POA: Diagnosis not present

## 2016-10-11 DIAGNOSIS — Z79899 Other long term (current) drug therapy: Secondary | ICD-10-CM | POA: Diagnosis not present

## 2016-10-12 DIAGNOSIS — Z79899 Other long term (current) drug therapy: Secondary | ICD-10-CM | POA: Diagnosis not present

## 2016-10-12 DIAGNOSIS — D631 Anemia in chronic kidney disease: Secondary | ICD-10-CM | POA: Diagnosis not present

## 2016-10-12 DIAGNOSIS — R8299 Other abnormal findings in urine: Secondary | ICD-10-CM | POA: Diagnosis not present

## 2016-10-12 DIAGNOSIS — D509 Iron deficiency anemia, unspecified: Secondary | ICD-10-CM | POA: Diagnosis not present

## 2016-10-12 DIAGNOSIS — N2581 Secondary hyperparathyroidism of renal origin: Secondary | ICD-10-CM | POA: Diagnosis not present

## 2016-10-12 DIAGNOSIS — N186 End stage renal disease: Secondary | ICD-10-CM | POA: Diagnosis not present

## 2016-10-12 DIAGNOSIS — E440 Moderate protein-calorie malnutrition: Secondary | ICD-10-CM | POA: Diagnosis not present

## 2016-10-13 DIAGNOSIS — D631 Anemia in chronic kidney disease: Secondary | ICD-10-CM | POA: Diagnosis not present

## 2016-10-13 DIAGNOSIS — E440 Moderate protein-calorie malnutrition: Secondary | ICD-10-CM | POA: Diagnosis not present

## 2016-10-13 DIAGNOSIS — N2581 Secondary hyperparathyroidism of renal origin: Secondary | ICD-10-CM | POA: Diagnosis not present

## 2016-10-13 DIAGNOSIS — D509 Iron deficiency anemia, unspecified: Secondary | ICD-10-CM | POA: Diagnosis not present

## 2016-10-13 DIAGNOSIS — N186 End stage renal disease: Secondary | ICD-10-CM | POA: Diagnosis not present

## 2016-10-13 DIAGNOSIS — Z79899 Other long term (current) drug therapy: Secondary | ICD-10-CM | POA: Diagnosis not present

## 2016-10-14 DIAGNOSIS — N186 End stage renal disease: Secondary | ICD-10-CM | POA: Diagnosis not present

## 2016-10-14 DIAGNOSIS — Z79899 Other long term (current) drug therapy: Secondary | ICD-10-CM | POA: Diagnosis not present

## 2016-10-14 DIAGNOSIS — E440 Moderate protein-calorie malnutrition: Secondary | ICD-10-CM | POA: Diagnosis not present

## 2016-10-14 DIAGNOSIS — D509 Iron deficiency anemia, unspecified: Secondary | ICD-10-CM | POA: Diagnosis not present

## 2016-10-14 DIAGNOSIS — D631 Anemia in chronic kidney disease: Secondary | ICD-10-CM | POA: Diagnosis not present

## 2016-10-14 DIAGNOSIS — N2581 Secondary hyperparathyroidism of renal origin: Secondary | ICD-10-CM | POA: Diagnosis not present

## 2016-10-15 DIAGNOSIS — N186 End stage renal disease: Secondary | ICD-10-CM | POA: Diagnosis not present

## 2016-10-15 DIAGNOSIS — E440 Moderate protein-calorie malnutrition: Secondary | ICD-10-CM | POA: Diagnosis not present

## 2016-10-15 DIAGNOSIS — D509 Iron deficiency anemia, unspecified: Secondary | ICD-10-CM | POA: Diagnosis not present

## 2016-10-15 DIAGNOSIS — N2581 Secondary hyperparathyroidism of renal origin: Secondary | ICD-10-CM | POA: Diagnosis not present

## 2016-10-15 DIAGNOSIS — D631 Anemia in chronic kidney disease: Secondary | ICD-10-CM | POA: Diagnosis not present

## 2016-10-15 DIAGNOSIS — Z79899 Other long term (current) drug therapy: Secondary | ICD-10-CM | POA: Diagnosis not present

## 2016-10-16 DIAGNOSIS — D509 Iron deficiency anemia, unspecified: Secondary | ICD-10-CM | POA: Diagnosis not present

## 2016-10-16 DIAGNOSIS — N186 End stage renal disease: Secondary | ICD-10-CM | POA: Diagnosis not present

## 2016-10-16 DIAGNOSIS — E440 Moderate protein-calorie malnutrition: Secondary | ICD-10-CM | POA: Diagnosis not present

## 2016-10-16 DIAGNOSIS — D631 Anemia in chronic kidney disease: Secondary | ICD-10-CM | POA: Diagnosis not present

## 2016-10-16 DIAGNOSIS — Z79899 Other long term (current) drug therapy: Secondary | ICD-10-CM | POA: Diagnosis not present

## 2016-10-16 DIAGNOSIS — N2581 Secondary hyperparathyroidism of renal origin: Secondary | ICD-10-CM | POA: Diagnosis not present

## 2016-10-17 DIAGNOSIS — D631 Anemia in chronic kidney disease: Secondary | ICD-10-CM | POA: Diagnosis not present

## 2016-10-17 DIAGNOSIS — Z79899 Other long term (current) drug therapy: Secondary | ICD-10-CM | POA: Diagnosis not present

## 2016-10-17 DIAGNOSIS — E440 Moderate protein-calorie malnutrition: Secondary | ICD-10-CM | POA: Diagnosis not present

## 2016-10-17 DIAGNOSIS — N2581 Secondary hyperparathyroidism of renal origin: Secondary | ICD-10-CM | POA: Diagnosis not present

## 2016-10-17 DIAGNOSIS — N186 End stage renal disease: Secondary | ICD-10-CM | POA: Diagnosis not present

## 2016-10-17 DIAGNOSIS — D509 Iron deficiency anemia, unspecified: Secondary | ICD-10-CM | POA: Diagnosis not present

## 2016-10-18 DIAGNOSIS — D631 Anemia in chronic kidney disease: Secondary | ICD-10-CM | POA: Diagnosis not present

## 2016-10-18 DIAGNOSIS — Z79899 Other long term (current) drug therapy: Secondary | ICD-10-CM | POA: Diagnosis not present

## 2016-10-18 DIAGNOSIS — E440 Moderate protein-calorie malnutrition: Secondary | ICD-10-CM | POA: Diagnosis not present

## 2016-10-18 DIAGNOSIS — N2581 Secondary hyperparathyroidism of renal origin: Secondary | ICD-10-CM | POA: Diagnosis not present

## 2016-10-18 DIAGNOSIS — D509 Iron deficiency anemia, unspecified: Secondary | ICD-10-CM | POA: Diagnosis not present

## 2016-10-18 DIAGNOSIS — N186 End stage renal disease: Secondary | ICD-10-CM | POA: Diagnosis not present

## 2016-10-19 DIAGNOSIS — Z79899 Other long term (current) drug therapy: Secondary | ICD-10-CM | POA: Diagnosis not present

## 2016-10-19 DIAGNOSIS — N186 End stage renal disease: Secondary | ICD-10-CM | POA: Diagnosis not present

## 2016-10-19 DIAGNOSIS — E440 Moderate protein-calorie malnutrition: Secondary | ICD-10-CM | POA: Diagnosis not present

## 2016-10-19 DIAGNOSIS — N2581 Secondary hyperparathyroidism of renal origin: Secondary | ICD-10-CM | POA: Diagnosis not present

## 2016-10-19 DIAGNOSIS — D509 Iron deficiency anemia, unspecified: Secondary | ICD-10-CM | POA: Diagnosis not present

## 2016-10-19 DIAGNOSIS — D631 Anemia in chronic kidney disease: Secondary | ICD-10-CM | POA: Diagnosis not present

## 2016-10-20 DIAGNOSIS — E440 Moderate protein-calorie malnutrition: Secondary | ICD-10-CM | POA: Diagnosis not present

## 2016-10-20 DIAGNOSIS — N2581 Secondary hyperparathyroidism of renal origin: Secondary | ICD-10-CM | POA: Diagnosis not present

## 2016-10-20 DIAGNOSIS — Z79899 Other long term (current) drug therapy: Secondary | ICD-10-CM | POA: Diagnosis not present

## 2016-10-20 DIAGNOSIS — N186 End stage renal disease: Secondary | ICD-10-CM | POA: Diagnosis not present

## 2016-10-20 DIAGNOSIS — D509 Iron deficiency anemia, unspecified: Secondary | ICD-10-CM | POA: Diagnosis not present

## 2016-10-20 DIAGNOSIS — D631 Anemia in chronic kidney disease: Secondary | ICD-10-CM | POA: Diagnosis not present

## 2016-10-21 DIAGNOSIS — D631 Anemia in chronic kidney disease: Secondary | ICD-10-CM | POA: Diagnosis not present

## 2016-10-21 DIAGNOSIS — D509 Iron deficiency anemia, unspecified: Secondary | ICD-10-CM | POA: Diagnosis not present

## 2016-10-21 DIAGNOSIS — E440 Moderate protein-calorie malnutrition: Secondary | ICD-10-CM | POA: Diagnosis not present

## 2016-10-21 DIAGNOSIS — N2581 Secondary hyperparathyroidism of renal origin: Secondary | ICD-10-CM | POA: Diagnosis not present

## 2016-10-21 DIAGNOSIS — Z79899 Other long term (current) drug therapy: Secondary | ICD-10-CM | POA: Diagnosis not present

## 2016-10-21 DIAGNOSIS — N186 End stage renal disease: Secondary | ICD-10-CM | POA: Diagnosis not present

## 2016-10-22 DIAGNOSIS — D631 Anemia in chronic kidney disease: Secondary | ICD-10-CM | POA: Diagnosis not present

## 2016-10-22 DIAGNOSIS — E440 Moderate protein-calorie malnutrition: Secondary | ICD-10-CM | POA: Diagnosis not present

## 2016-10-22 DIAGNOSIS — N186 End stage renal disease: Secondary | ICD-10-CM | POA: Diagnosis not present

## 2016-10-22 DIAGNOSIS — Z79899 Other long term (current) drug therapy: Secondary | ICD-10-CM | POA: Diagnosis not present

## 2016-10-22 DIAGNOSIS — D509 Iron deficiency anemia, unspecified: Secondary | ICD-10-CM | POA: Diagnosis not present

## 2016-10-22 DIAGNOSIS — N2581 Secondary hyperparathyroidism of renal origin: Secondary | ICD-10-CM | POA: Diagnosis not present

## 2016-10-23 DIAGNOSIS — E440 Moderate protein-calorie malnutrition: Secondary | ICD-10-CM | POA: Diagnosis not present

## 2016-10-23 DIAGNOSIS — D631 Anemia in chronic kidney disease: Secondary | ICD-10-CM | POA: Diagnosis not present

## 2016-10-23 DIAGNOSIS — N186 End stage renal disease: Secondary | ICD-10-CM | POA: Diagnosis not present

## 2016-10-23 DIAGNOSIS — D509 Iron deficiency anemia, unspecified: Secondary | ICD-10-CM | POA: Diagnosis not present

## 2016-10-23 DIAGNOSIS — N2581 Secondary hyperparathyroidism of renal origin: Secondary | ICD-10-CM | POA: Diagnosis not present

## 2016-10-23 DIAGNOSIS — Z79899 Other long term (current) drug therapy: Secondary | ICD-10-CM | POA: Diagnosis not present

## 2016-10-24 DIAGNOSIS — D631 Anemia in chronic kidney disease: Secondary | ICD-10-CM | POA: Diagnosis not present

## 2016-10-24 DIAGNOSIS — Z79899 Other long term (current) drug therapy: Secondary | ICD-10-CM | POA: Diagnosis not present

## 2016-10-24 DIAGNOSIS — N2581 Secondary hyperparathyroidism of renal origin: Secondary | ICD-10-CM | POA: Diagnosis not present

## 2016-10-24 DIAGNOSIS — N186 End stage renal disease: Secondary | ICD-10-CM | POA: Diagnosis not present

## 2016-10-24 DIAGNOSIS — E440 Moderate protein-calorie malnutrition: Secondary | ICD-10-CM | POA: Diagnosis not present

## 2016-10-24 DIAGNOSIS — D509 Iron deficiency anemia, unspecified: Secondary | ICD-10-CM | POA: Diagnosis not present

## 2016-10-25 DIAGNOSIS — Z79899 Other long term (current) drug therapy: Secondary | ICD-10-CM | POA: Diagnosis not present

## 2016-10-25 DIAGNOSIS — N186 End stage renal disease: Secondary | ICD-10-CM | POA: Diagnosis not present

## 2016-10-25 DIAGNOSIS — E440 Moderate protein-calorie malnutrition: Secondary | ICD-10-CM | POA: Diagnosis not present

## 2016-10-25 DIAGNOSIS — D631 Anemia in chronic kidney disease: Secondary | ICD-10-CM | POA: Diagnosis not present

## 2016-10-25 DIAGNOSIS — D509 Iron deficiency anemia, unspecified: Secondary | ICD-10-CM | POA: Diagnosis not present

## 2016-10-25 DIAGNOSIS — N2581 Secondary hyperparathyroidism of renal origin: Secondary | ICD-10-CM | POA: Diagnosis not present

## 2016-10-26 DIAGNOSIS — D509 Iron deficiency anemia, unspecified: Secondary | ICD-10-CM | POA: Diagnosis not present

## 2016-10-26 DIAGNOSIS — D631 Anemia in chronic kidney disease: Secondary | ICD-10-CM | POA: Diagnosis not present

## 2016-10-26 DIAGNOSIS — E440 Moderate protein-calorie malnutrition: Secondary | ICD-10-CM | POA: Diagnosis not present

## 2016-10-26 DIAGNOSIS — Z79899 Other long term (current) drug therapy: Secondary | ICD-10-CM | POA: Diagnosis not present

## 2016-10-26 DIAGNOSIS — N2581 Secondary hyperparathyroidism of renal origin: Secondary | ICD-10-CM | POA: Diagnosis not present

## 2016-10-26 DIAGNOSIS — N186 End stage renal disease: Secondary | ICD-10-CM | POA: Diagnosis not present

## 2016-10-27 DIAGNOSIS — Z79899 Other long term (current) drug therapy: Secondary | ICD-10-CM | POA: Diagnosis not present

## 2016-10-27 DIAGNOSIS — D631 Anemia in chronic kidney disease: Secondary | ICD-10-CM | POA: Diagnosis not present

## 2016-10-27 DIAGNOSIS — E440 Moderate protein-calorie malnutrition: Secondary | ICD-10-CM | POA: Diagnosis not present

## 2016-10-27 DIAGNOSIS — N2581 Secondary hyperparathyroidism of renal origin: Secondary | ICD-10-CM | POA: Diagnosis not present

## 2016-10-27 DIAGNOSIS — D509 Iron deficiency anemia, unspecified: Secondary | ICD-10-CM | POA: Diagnosis not present

## 2016-10-27 DIAGNOSIS — N186 End stage renal disease: Secondary | ICD-10-CM | POA: Diagnosis not present

## 2016-10-28 DIAGNOSIS — N2581 Secondary hyperparathyroidism of renal origin: Secondary | ICD-10-CM | POA: Diagnosis not present

## 2016-10-28 DIAGNOSIS — D631 Anemia in chronic kidney disease: Secondary | ICD-10-CM | POA: Diagnosis not present

## 2016-10-28 DIAGNOSIS — N186 End stage renal disease: Secondary | ICD-10-CM | POA: Diagnosis not present

## 2016-10-28 DIAGNOSIS — Z79899 Other long term (current) drug therapy: Secondary | ICD-10-CM | POA: Diagnosis not present

## 2016-10-28 DIAGNOSIS — D509 Iron deficiency anemia, unspecified: Secondary | ICD-10-CM | POA: Diagnosis not present

## 2016-10-28 DIAGNOSIS — E440 Moderate protein-calorie malnutrition: Secondary | ICD-10-CM | POA: Diagnosis not present

## 2016-10-29 DIAGNOSIS — N186 End stage renal disease: Secondary | ICD-10-CM | POA: Diagnosis not present

## 2016-10-29 DIAGNOSIS — E440 Moderate protein-calorie malnutrition: Secondary | ICD-10-CM | POA: Diagnosis not present

## 2016-10-29 DIAGNOSIS — Z79899 Other long term (current) drug therapy: Secondary | ICD-10-CM | POA: Diagnosis not present

## 2016-10-29 DIAGNOSIS — D631 Anemia in chronic kidney disease: Secondary | ICD-10-CM | POA: Diagnosis not present

## 2016-10-29 DIAGNOSIS — D509 Iron deficiency anemia, unspecified: Secondary | ICD-10-CM | POA: Diagnosis not present

## 2016-10-29 DIAGNOSIS — N2581 Secondary hyperparathyroidism of renal origin: Secondary | ICD-10-CM | POA: Diagnosis not present

## 2016-10-30 DIAGNOSIS — D509 Iron deficiency anemia, unspecified: Secondary | ICD-10-CM | POA: Diagnosis not present

## 2016-10-30 DIAGNOSIS — N2581 Secondary hyperparathyroidism of renal origin: Secondary | ICD-10-CM | POA: Diagnosis not present

## 2016-10-30 DIAGNOSIS — Z79899 Other long term (current) drug therapy: Secondary | ICD-10-CM | POA: Diagnosis not present

## 2016-10-30 DIAGNOSIS — E440 Moderate protein-calorie malnutrition: Secondary | ICD-10-CM | POA: Diagnosis not present

## 2016-10-30 DIAGNOSIS — D631 Anemia in chronic kidney disease: Secondary | ICD-10-CM | POA: Diagnosis not present

## 2016-10-30 DIAGNOSIS — N186 End stage renal disease: Secondary | ICD-10-CM | POA: Diagnosis not present

## 2016-10-31 DIAGNOSIS — Z992 Dependence on renal dialysis: Secondary | ICD-10-CM | POA: Diagnosis not present

## 2016-10-31 DIAGNOSIS — N186 End stage renal disease: Secondary | ICD-10-CM | POA: Diagnosis not present

## 2016-10-31 DIAGNOSIS — Z7682 Awaiting organ transplant status: Secondary | ICD-10-CM | POA: Diagnosis not present

## 2016-10-31 DIAGNOSIS — D631 Anemia in chronic kidney disease: Secondary | ICD-10-CM | POA: Diagnosis not present

## 2016-10-31 DIAGNOSIS — Z79899 Other long term (current) drug therapy: Secondary | ICD-10-CM | POA: Diagnosis not present

## 2016-10-31 DIAGNOSIS — N2581 Secondary hyperparathyroidism of renal origin: Secondary | ICD-10-CM | POA: Diagnosis not present

## 2016-10-31 DIAGNOSIS — D509 Iron deficiency anemia, unspecified: Secondary | ICD-10-CM | POA: Diagnosis not present

## 2016-10-31 DIAGNOSIS — E440 Moderate protein-calorie malnutrition: Secondary | ICD-10-CM | POA: Diagnosis not present

## 2016-11-01 DIAGNOSIS — N186 End stage renal disease: Secondary | ICD-10-CM | POA: Diagnosis not present

## 2016-11-01 DIAGNOSIS — N2581 Secondary hyperparathyroidism of renal origin: Secondary | ICD-10-CM | POA: Diagnosis not present

## 2016-11-01 DIAGNOSIS — Z992 Dependence on renal dialysis: Secondary | ICD-10-CM | POA: Diagnosis not present

## 2016-11-01 DIAGNOSIS — E440 Moderate protein-calorie malnutrition: Secondary | ICD-10-CM | POA: Diagnosis not present

## 2016-11-01 DIAGNOSIS — D631 Anemia in chronic kidney disease: Secondary | ICD-10-CM | POA: Diagnosis not present

## 2016-11-01 DIAGNOSIS — D509 Iron deficiency anemia, unspecified: Secondary | ICD-10-CM | POA: Diagnosis not present

## 2016-11-01 DIAGNOSIS — N2889 Other specified disorders of kidney and ureter: Secondary | ICD-10-CM | POA: Diagnosis not present

## 2016-11-01 DIAGNOSIS — Z79899 Other long term (current) drug therapy: Secondary | ICD-10-CM | POA: Diagnosis not present

## 2016-11-02 DIAGNOSIS — Z79899 Other long term (current) drug therapy: Secondary | ICD-10-CM | POA: Diagnosis not present

## 2016-11-02 DIAGNOSIS — D631 Anemia in chronic kidney disease: Secondary | ICD-10-CM | POA: Diagnosis not present

## 2016-11-02 DIAGNOSIS — D509 Iron deficiency anemia, unspecified: Secondary | ICD-10-CM | POA: Diagnosis not present

## 2016-11-02 DIAGNOSIS — N2581 Secondary hyperparathyroidism of renal origin: Secondary | ICD-10-CM | POA: Diagnosis not present

## 2016-11-02 DIAGNOSIS — E440 Moderate protein-calorie malnutrition: Secondary | ICD-10-CM | POA: Diagnosis not present

## 2016-11-02 DIAGNOSIS — N186 End stage renal disease: Secondary | ICD-10-CM | POA: Diagnosis not present

## 2016-11-03 DIAGNOSIS — Z79899 Other long term (current) drug therapy: Secondary | ICD-10-CM | POA: Diagnosis not present

## 2016-11-03 DIAGNOSIS — D509 Iron deficiency anemia, unspecified: Secondary | ICD-10-CM | POA: Diagnosis not present

## 2016-11-03 DIAGNOSIS — N2581 Secondary hyperparathyroidism of renal origin: Secondary | ICD-10-CM | POA: Diagnosis not present

## 2016-11-03 DIAGNOSIS — N186 End stage renal disease: Secondary | ICD-10-CM | POA: Diagnosis not present

## 2016-11-03 DIAGNOSIS — E440 Moderate protein-calorie malnutrition: Secondary | ICD-10-CM | POA: Diagnosis not present

## 2016-11-03 DIAGNOSIS — D631 Anemia in chronic kidney disease: Secondary | ICD-10-CM | POA: Diagnosis not present

## 2016-11-04 DIAGNOSIS — Z79899 Other long term (current) drug therapy: Secondary | ICD-10-CM | POA: Diagnosis not present

## 2016-11-04 DIAGNOSIS — E440 Moderate protein-calorie malnutrition: Secondary | ICD-10-CM | POA: Diagnosis not present

## 2016-11-04 DIAGNOSIS — N186 End stage renal disease: Secondary | ICD-10-CM | POA: Diagnosis not present

## 2016-11-04 DIAGNOSIS — D631 Anemia in chronic kidney disease: Secondary | ICD-10-CM | POA: Diagnosis not present

## 2016-11-04 DIAGNOSIS — D509 Iron deficiency anemia, unspecified: Secondary | ICD-10-CM | POA: Diagnosis not present

## 2016-11-04 DIAGNOSIS — N2581 Secondary hyperparathyroidism of renal origin: Secondary | ICD-10-CM | POA: Diagnosis not present

## 2016-11-05 DIAGNOSIS — N186 End stage renal disease: Secondary | ICD-10-CM | POA: Diagnosis not present

## 2016-11-05 DIAGNOSIS — N2581 Secondary hyperparathyroidism of renal origin: Secondary | ICD-10-CM | POA: Diagnosis not present

## 2016-11-05 DIAGNOSIS — E440 Moderate protein-calorie malnutrition: Secondary | ICD-10-CM | POA: Diagnosis not present

## 2016-11-05 DIAGNOSIS — Z79899 Other long term (current) drug therapy: Secondary | ICD-10-CM | POA: Diagnosis not present

## 2016-11-05 DIAGNOSIS — D509 Iron deficiency anemia, unspecified: Secondary | ICD-10-CM | POA: Diagnosis not present

## 2016-11-05 DIAGNOSIS — D631 Anemia in chronic kidney disease: Secondary | ICD-10-CM | POA: Diagnosis not present

## 2016-11-06 DIAGNOSIS — R8299 Other abnormal findings in urine: Secondary | ICD-10-CM | POA: Diagnosis not present

## 2016-11-06 DIAGNOSIS — D631 Anemia in chronic kidney disease: Secondary | ICD-10-CM | POA: Diagnosis not present

## 2016-11-06 DIAGNOSIS — N2581 Secondary hyperparathyroidism of renal origin: Secondary | ICD-10-CM | POA: Diagnosis not present

## 2016-11-06 DIAGNOSIS — E440 Moderate protein-calorie malnutrition: Secondary | ICD-10-CM | POA: Diagnosis not present

## 2016-11-06 DIAGNOSIS — N186 End stage renal disease: Secondary | ICD-10-CM | POA: Diagnosis not present

## 2016-11-06 DIAGNOSIS — D509 Iron deficiency anemia, unspecified: Secondary | ICD-10-CM | POA: Diagnosis not present

## 2016-11-06 DIAGNOSIS — Z79899 Other long term (current) drug therapy: Secondary | ICD-10-CM | POA: Diagnosis not present

## 2016-11-07 DIAGNOSIS — N186 End stage renal disease: Secondary | ICD-10-CM | POA: Diagnosis not present

## 2016-11-07 DIAGNOSIS — N2581 Secondary hyperparathyroidism of renal origin: Secondary | ICD-10-CM | POA: Diagnosis not present

## 2016-11-07 DIAGNOSIS — Z79899 Other long term (current) drug therapy: Secondary | ICD-10-CM | POA: Diagnosis not present

## 2016-11-07 DIAGNOSIS — D509 Iron deficiency anemia, unspecified: Secondary | ICD-10-CM | POA: Diagnosis not present

## 2016-11-07 DIAGNOSIS — D631 Anemia in chronic kidney disease: Secondary | ICD-10-CM | POA: Diagnosis not present

## 2016-11-07 DIAGNOSIS — E440 Moderate protein-calorie malnutrition: Secondary | ICD-10-CM | POA: Diagnosis not present

## 2016-11-08 DIAGNOSIS — D631 Anemia in chronic kidney disease: Secondary | ICD-10-CM | POA: Diagnosis not present

## 2016-11-08 DIAGNOSIS — N186 End stage renal disease: Secondary | ICD-10-CM | POA: Diagnosis not present

## 2016-11-08 DIAGNOSIS — E440 Moderate protein-calorie malnutrition: Secondary | ICD-10-CM | POA: Diagnosis not present

## 2016-11-08 DIAGNOSIS — Z79899 Other long term (current) drug therapy: Secondary | ICD-10-CM | POA: Diagnosis not present

## 2016-11-08 DIAGNOSIS — N2581 Secondary hyperparathyroidism of renal origin: Secondary | ICD-10-CM | POA: Diagnosis not present

## 2016-11-08 DIAGNOSIS — D509 Iron deficiency anemia, unspecified: Secondary | ICD-10-CM | POA: Diagnosis not present

## 2016-11-09 DIAGNOSIS — N186 End stage renal disease: Secondary | ICD-10-CM | POA: Diagnosis not present

## 2016-11-09 DIAGNOSIS — D509 Iron deficiency anemia, unspecified: Secondary | ICD-10-CM | POA: Diagnosis not present

## 2016-11-09 DIAGNOSIS — N2581 Secondary hyperparathyroidism of renal origin: Secondary | ICD-10-CM | POA: Diagnosis not present

## 2016-11-09 DIAGNOSIS — E440 Moderate protein-calorie malnutrition: Secondary | ICD-10-CM | POA: Diagnosis not present

## 2016-11-09 DIAGNOSIS — Z79899 Other long term (current) drug therapy: Secondary | ICD-10-CM | POA: Diagnosis not present

## 2016-11-09 DIAGNOSIS — D631 Anemia in chronic kidney disease: Secondary | ICD-10-CM | POA: Diagnosis not present

## 2016-11-10 DIAGNOSIS — D509 Iron deficiency anemia, unspecified: Secondary | ICD-10-CM | POA: Diagnosis not present

## 2016-11-10 DIAGNOSIS — Z79899 Other long term (current) drug therapy: Secondary | ICD-10-CM | POA: Diagnosis not present

## 2016-11-10 DIAGNOSIS — D631 Anemia in chronic kidney disease: Secondary | ICD-10-CM | POA: Diagnosis not present

## 2016-11-10 DIAGNOSIS — N2581 Secondary hyperparathyroidism of renal origin: Secondary | ICD-10-CM | POA: Diagnosis not present

## 2016-11-10 DIAGNOSIS — E440 Moderate protein-calorie malnutrition: Secondary | ICD-10-CM | POA: Diagnosis not present

## 2016-11-10 DIAGNOSIS — N186 End stage renal disease: Secondary | ICD-10-CM | POA: Diagnosis not present

## 2016-11-11 DIAGNOSIS — D631 Anemia in chronic kidney disease: Secondary | ICD-10-CM | POA: Diagnosis not present

## 2016-11-11 DIAGNOSIS — Z79899 Other long term (current) drug therapy: Secondary | ICD-10-CM | POA: Diagnosis not present

## 2016-11-11 DIAGNOSIS — N2581 Secondary hyperparathyroidism of renal origin: Secondary | ICD-10-CM | POA: Diagnosis not present

## 2016-11-11 DIAGNOSIS — E440 Moderate protein-calorie malnutrition: Secondary | ICD-10-CM | POA: Diagnosis not present

## 2016-11-11 DIAGNOSIS — N186 End stage renal disease: Secondary | ICD-10-CM | POA: Diagnosis not present

## 2016-11-11 DIAGNOSIS — D509 Iron deficiency anemia, unspecified: Secondary | ICD-10-CM | POA: Diagnosis not present

## 2016-11-12 DIAGNOSIS — N186 End stage renal disease: Secondary | ICD-10-CM | POA: Diagnosis not present

## 2016-11-12 DIAGNOSIS — D631 Anemia in chronic kidney disease: Secondary | ICD-10-CM | POA: Diagnosis not present

## 2016-11-12 DIAGNOSIS — Z79899 Other long term (current) drug therapy: Secondary | ICD-10-CM | POA: Diagnosis not present

## 2016-11-12 DIAGNOSIS — E440 Moderate protein-calorie malnutrition: Secondary | ICD-10-CM | POA: Diagnosis not present

## 2016-11-12 DIAGNOSIS — D509 Iron deficiency anemia, unspecified: Secondary | ICD-10-CM | POA: Diagnosis not present

## 2016-11-12 DIAGNOSIS — N2581 Secondary hyperparathyroidism of renal origin: Secondary | ICD-10-CM | POA: Diagnosis not present

## 2016-11-13 DIAGNOSIS — D509 Iron deficiency anemia, unspecified: Secondary | ICD-10-CM | POA: Diagnosis not present

## 2016-11-13 DIAGNOSIS — Z79899 Other long term (current) drug therapy: Secondary | ICD-10-CM | POA: Diagnosis not present

## 2016-11-13 DIAGNOSIS — D631 Anemia in chronic kidney disease: Secondary | ICD-10-CM | POA: Diagnosis not present

## 2016-11-13 DIAGNOSIS — N186 End stage renal disease: Secondary | ICD-10-CM | POA: Diagnosis not present

## 2016-11-13 DIAGNOSIS — N2581 Secondary hyperparathyroidism of renal origin: Secondary | ICD-10-CM | POA: Diagnosis not present

## 2016-11-13 DIAGNOSIS — E440 Moderate protein-calorie malnutrition: Secondary | ICD-10-CM | POA: Diagnosis not present

## 2016-11-14 DIAGNOSIS — E440 Moderate protein-calorie malnutrition: Secondary | ICD-10-CM | POA: Diagnosis not present

## 2016-11-14 DIAGNOSIS — Z79899 Other long term (current) drug therapy: Secondary | ICD-10-CM | POA: Diagnosis not present

## 2016-11-14 DIAGNOSIS — D631 Anemia in chronic kidney disease: Secondary | ICD-10-CM | POA: Diagnosis not present

## 2016-11-14 DIAGNOSIS — N186 End stage renal disease: Secondary | ICD-10-CM | POA: Diagnosis not present

## 2016-11-14 DIAGNOSIS — N2581 Secondary hyperparathyroidism of renal origin: Secondary | ICD-10-CM | POA: Diagnosis not present

## 2016-11-14 DIAGNOSIS — D509 Iron deficiency anemia, unspecified: Secondary | ICD-10-CM | POA: Diagnosis not present

## 2016-11-15 DIAGNOSIS — N2581 Secondary hyperparathyroidism of renal origin: Secondary | ICD-10-CM | POA: Diagnosis not present

## 2016-11-15 DIAGNOSIS — E440 Moderate protein-calorie malnutrition: Secondary | ICD-10-CM | POA: Diagnosis not present

## 2016-11-15 DIAGNOSIS — D509 Iron deficiency anemia, unspecified: Secondary | ICD-10-CM | POA: Diagnosis not present

## 2016-11-15 DIAGNOSIS — N186 End stage renal disease: Secondary | ICD-10-CM | POA: Diagnosis not present

## 2016-11-15 DIAGNOSIS — D631 Anemia in chronic kidney disease: Secondary | ICD-10-CM | POA: Diagnosis not present

## 2016-11-15 DIAGNOSIS — Z79899 Other long term (current) drug therapy: Secondary | ICD-10-CM | POA: Diagnosis not present

## 2016-11-16 DIAGNOSIS — N2581 Secondary hyperparathyroidism of renal origin: Secondary | ICD-10-CM | POA: Diagnosis not present

## 2016-11-16 DIAGNOSIS — D631 Anemia in chronic kidney disease: Secondary | ICD-10-CM | POA: Diagnosis not present

## 2016-11-16 DIAGNOSIS — N186 End stage renal disease: Secondary | ICD-10-CM | POA: Diagnosis not present

## 2016-11-16 DIAGNOSIS — D509 Iron deficiency anemia, unspecified: Secondary | ICD-10-CM | POA: Diagnosis not present

## 2016-11-16 DIAGNOSIS — Z79899 Other long term (current) drug therapy: Secondary | ICD-10-CM | POA: Diagnosis not present

## 2016-11-16 DIAGNOSIS — E440 Moderate protein-calorie malnutrition: Secondary | ICD-10-CM | POA: Diagnosis not present

## 2016-11-17 DIAGNOSIS — Z79899 Other long term (current) drug therapy: Secondary | ICD-10-CM | POA: Diagnosis not present

## 2016-11-17 DIAGNOSIS — N2581 Secondary hyperparathyroidism of renal origin: Secondary | ICD-10-CM | POA: Diagnosis not present

## 2016-11-17 DIAGNOSIS — E440 Moderate protein-calorie malnutrition: Secondary | ICD-10-CM | POA: Diagnosis not present

## 2016-11-17 DIAGNOSIS — N186 End stage renal disease: Secondary | ICD-10-CM | POA: Diagnosis not present

## 2016-11-17 DIAGNOSIS — D509 Iron deficiency anemia, unspecified: Secondary | ICD-10-CM | POA: Diagnosis not present

## 2016-11-17 DIAGNOSIS — D631 Anemia in chronic kidney disease: Secondary | ICD-10-CM | POA: Diagnosis not present

## 2016-11-18 DIAGNOSIS — D631 Anemia in chronic kidney disease: Secondary | ICD-10-CM | POA: Diagnosis not present

## 2016-11-18 DIAGNOSIS — N2581 Secondary hyperparathyroidism of renal origin: Secondary | ICD-10-CM | POA: Diagnosis not present

## 2016-11-18 DIAGNOSIS — N186 End stage renal disease: Secondary | ICD-10-CM | POA: Diagnosis not present

## 2016-11-18 DIAGNOSIS — E440 Moderate protein-calorie malnutrition: Secondary | ICD-10-CM | POA: Diagnosis not present

## 2016-11-18 DIAGNOSIS — Z79899 Other long term (current) drug therapy: Secondary | ICD-10-CM | POA: Diagnosis not present

## 2016-11-18 DIAGNOSIS — D509 Iron deficiency anemia, unspecified: Secondary | ICD-10-CM | POA: Diagnosis not present

## 2016-11-19 DIAGNOSIS — D509 Iron deficiency anemia, unspecified: Secondary | ICD-10-CM | POA: Diagnosis not present

## 2016-11-19 DIAGNOSIS — Z79899 Other long term (current) drug therapy: Secondary | ICD-10-CM | POA: Diagnosis not present

## 2016-11-19 DIAGNOSIS — E440 Moderate protein-calorie malnutrition: Secondary | ICD-10-CM | POA: Diagnosis not present

## 2016-11-19 DIAGNOSIS — N186 End stage renal disease: Secondary | ICD-10-CM | POA: Diagnosis not present

## 2016-11-19 DIAGNOSIS — N2581 Secondary hyperparathyroidism of renal origin: Secondary | ICD-10-CM | POA: Diagnosis not present

## 2016-11-19 DIAGNOSIS — D631 Anemia in chronic kidney disease: Secondary | ICD-10-CM | POA: Diagnosis not present

## 2016-11-20 DIAGNOSIS — N2581 Secondary hyperparathyroidism of renal origin: Secondary | ICD-10-CM | POA: Diagnosis not present

## 2016-11-20 DIAGNOSIS — Z79899 Other long term (current) drug therapy: Secondary | ICD-10-CM | POA: Diagnosis not present

## 2016-11-20 DIAGNOSIS — N186 End stage renal disease: Secondary | ICD-10-CM | POA: Diagnosis not present

## 2016-11-20 DIAGNOSIS — E440 Moderate protein-calorie malnutrition: Secondary | ICD-10-CM | POA: Diagnosis not present

## 2016-11-20 DIAGNOSIS — R310 Gross hematuria: Secondary | ICD-10-CM | POA: Diagnosis not present

## 2016-11-20 DIAGNOSIS — D509 Iron deficiency anemia, unspecified: Secondary | ICD-10-CM | POA: Diagnosis not present

## 2016-11-20 DIAGNOSIS — D631 Anemia in chronic kidney disease: Secondary | ICD-10-CM | POA: Diagnosis not present

## 2016-11-21 DIAGNOSIS — N2581 Secondary hyperparathyroidism of renal origin: Secondary | ICD-10-CM | POA: Diagnosis not present

## 2016-11-21 DIAGNOSIS — D509 Iron deficiency anemia, unspecified: Secondary | ICD-10-CM | POA: Diagnosis not present

## 2016-11-21 DIAGNOSIS — D631 Anemia in chronic kidney disease: Secondary | ICD-10-CM | POA: Diagnosis not present

## 2016-11-21 DIAGNOSIS — E440 Moderate protein-calorie malnutrition: Secondary | ICD-10-CM | POA: Diagnosis not present

## 2016-11-21 DIAGNOSIS — Z79899 Other long term (current) drug therapy: Secondary | ICD-10-CM | POA: Diagnosis not present

## 2016-11-21 DIAGNOSIS — N186 End stage renal disease: Secondary | ICD-10-CM | POA: Diagnosis not present

## 2016-11-22 DIAGNOSIS — E440 Moderate protein-calorie malnutrition: Secondary | ICD-10-CM | POA: Diagnosis not present

## 2016-11-22 DIAGNOSIS — Z79899 Other long term (current) drug therapy: Secondary | ICD-10-CM | POA: Diagnosis not present

## 2016-11-22 DIAGNOSIS — D631 Anemia in chronic kidney disease: Secondary | ICD-10-CM | POA: Diagnosis not present

## 2016-11-22 DIAGNOSIS — D509 Iron deficiency anemia, unspecified: Secondary | ICD-10-CM | POA: Diagnosis not present

## 2016-11-22 DIAGNOSIS — N186 End stage renal disease: Secondary | ICD-10-CM | POA: Diagnosis not present

## 2016-11-22 DIAGNOSIS — N2581 Secondary hyperparathyroidism of renal origin: Secondary | ICD-10-CM | POA: Diagnosis not present

## 2016-11-23 DIAGNOSIS — D631 Anemia in chronic kidney disease: Secondary | ICD-10-CM | POA: Diagnosis not present

## 2016-11-23 DIAGNOSIS — Z79899 Other long term (current) drug therapy: Secondary | ICD-10-CM | POA: Diagnosis not present

## 2016-11-23 DIAGNOSIS — E440 Moderate protein-calorie malnutrition: Secondary | ICD-10-CM | POA: Diagnosis not present

## 2016-11-23 DIAGNOSIS — N186 End stage renal disease: Secondary | ICD-10-CM | POA: Diagnosis not present

## 2016-11-23 DIAGNOSIS — N2581 Secondary hyperparathyroidism of renal origin: Secondary | ICD-10-CM | POA: Diagnosis not present

## 2016-11-23 DIAGNOSIS — D509 Iron deficiency anemia, unspecified: Secondary | ICD-10-CM | POA: Diagnosis not present

## 2016-11-24 DIAGNOSIS — N2581 Secondary hyperparathyroidism of renal origin: Secondary | ICD-10-CM | POA: Diagnosis not present

## 2016-11-24 DIAGNOSIS — E440 Moderate protein-calorie malnutrition: Secondary | ICD-10-CM | POA: Diagnosis not present

## 2016-11-24 DIAGNOSIS — Z79899 Other long term (current) drug therapy: Secondary | ICD-10-CM | POA: Diagnosis not present

## 2016-11-24 DIAGNOSIS — D631 Anemia in chronic kidney disease: Secondary | ICD-10-CM | POA: Diagnosis not present

## 2016-11-24 DIAGNOSIS — D509 Iron deficiency anemia, unspecified: Secondary | ICD-10-CM | POA: Diagnosis not present

## 2016-11-24 DIAGNOSIS — N186 End stage renal disease: Secondary | ICD-10-CM | POA: Diagnosis not present

## 2016-11-25 DIAGNOSIS — D631 Anemia in chronic kidney disease: Secondary | ICD-10-CM | POA: Diagnosis not present

## 2016-11-25 DIAGNOSIS — N186 End stage renal disease: Secondary | ICD-10-CM | POA: Diagnosis not present

## 2016-11-25 DIAGNOSIS — E440 Moderate protein-calorie malnutrition: Secondary | ICD-10-CM | POA: Diagnosis not present

## 2016-11-25 DIAGNOSIS — N2581 Secondary hyperparathyroidism of renal origin: Secondary | ICD-10-CM | POA: Diagnosis not present

## 2016-11-25 DIAGNOSIS — D509 Iron deficiency anemia, unspecified: Secondary | ICD-10-CM | POA: Diagnosis not present

## 2016-11-25 DIAGNOSIS — Z79899 Other long term (current) drug therapy: Secondary | ICD-10-CM | POA: Diagnosis not present

## 2016-11-26 DIAGNOSIS — N2581 Secondary hyperparathyroidism of renal origin: Secondary | ICD-10-CM | POA: Diagnosis not present

## 2016-11-26 DIAGNOSIS — E440 Moderate protein-calorie malnutrition: Secondary | ICD-10-CM | POA: Diagnosis not present

## 2016-11-26 DIAGNOSIS — D631 Anemia in chronic kidney disease: Secondary | ICD-10-CM | POA: Diagnosis not present

## 2016-11-26 DIAGNOSIS — D509 Iron deficiency anemia, unspecified: Secondary | ICD-10-CM | POA: Diagnosis not present

## 2016-11-26 DIAGNOSIS — N186 End stage renal disease: Secondary | ICD-10-CM | POA: Diagnosis not present

## 2016-11-26 DIAGNOSIS — Z79899 Other long term (current) drug therapy: Secondary | ICD-10-CM | POA: Diagnosis not present

## 2016-11-27 DIAGNOSIS — D509 Iron deficiency anemia, unspecified: Secondary | ICD-10-CM | POA: Diagnosis not present

## 2016-11-27 DIAGNOSIS — N2581 Secondary hyperparathyroidism of renal origin: Secondary | ICD-10-CM | POA: Diagnosis not present

## 2016-11-27 DIAGNOSIS — E440 Moderate protein-calorie malnutrition: Secondary | ICD-10-CM | POA: Diagnosis not present

## 2016-11-27 DIAGNOSIS — N186 End stage renal disease: Secondary | ICD-10-CM | POA: Diagnosis not present

## 2016-11-27 DIAGNOSIS — D631 Anemia in chronic kidney disease: Secondary | ICD-10-CM | POA: Diagnosis not present

## 2016-11-27 DIAGNOSIS — Z79899 Other long term (current) drug therapy: Secondary | ICD-10-CM | POA: Diagnosis not present

## 2016-11-28 DIAGNOSIS — D509 Iron deficiency anemia, unspecified: Secondary | ICD-10-CM | POA: Diagnosis not present

## 2016-11-28 DIAGNOSIS — N186 End stage renal disease: Secondary | ICD-10-CM | POA: Diagnosis not present

## 2016-11-28 DIAGNOSIS — N2581 Secondary hyperparathyroidism of renal origin: Secondary | ICD-10-CM | POA: Diagnosis not present

## 2016-11-28 DIAGNOSIS — Z79899 Other long term (current) drug therapy: Secondary | ICD-10-CM | POA: Diagnosis not present

## 2016-11-28 DIAGNOSIS — D631 Anemia in chronic kidney disease: Secondary | ICD-10-CM | POA: Diagnosis not present

## 2016-11-28 DIAGNOSIS — E440 Moderate protein-calorie malnutrition: Secondary | ICD-10-CM | POA: Diagnosis not present

## 2016-11-29 DIAGNOSIS — E440 Moderate protein-calorie malnutrition: Secondary | ICD-10-CM | POA: Diagnosis not present

## 2016-11-29 DIAGNOSIS — D631 Anemia in chronic kidney disease: Secondary | ICD-10-CM | POA: Diagnosis not present

## 2016-11-29 DIAGNOSIS — N2581 Secondary hyperparathyroidism of renal origin: Secondary | ICD-10-CM | POA: Diagnosis not present

## 2016-11-29 DIAGNOSIS — Z79899 Other long term (current) drug therapy: Secondary | ICD-10-CM | POA: Diagnosis not present

## 2016-11-29 DIAGNOSIS — D509 Iron deficiency anemia, unspecified: Secondary | ICD-10-CM | POA: Diagnosis not present

## 2016-11-29 DIAGNOSIS — N186 End stage renal disease: Secondary | ICD-10-CM | POA: Diagnosis not present

## 2016-11-30 DIAGNOSIS — N186 End stage renal disease: Secondary | ICD-10-CM | POA: Diagnosis not present

## 2016-11-30 DIAGNOSIS — Z79899 Other long term (current) drug therapy: Secondary | ICD-10-CM | POA: Diagnosis not present

## 2016-11-30 DIAGNOSIS — N2581 Secondary hyperparathyroidism of renal origin: Secondary | ICD-10-CM | POA: Diagnosis not present

## 2016-11-30 DIAGNOSIS — D631 Anemia in chronic kidney disease: Secondary | ICD-10-CM | POA: Diagnosis not present

## 2016-11-30 DIAGNOSIS — D509 Iron deficiency anemia, unspecified: Secondary | ICD-10-CM | POA: Diagnosis not present

## 2016-11-30 DIAGNOSIS — E440 Moderate protein-calorie malnutrition: Secondary | ICD-10-CM | POA: Diagnosis not present

## 2016-12-01 DIAGNOSIS — Z79899 Other long term (current) drug therapy: Secondary | ICD-10-CM | POA: Diagnosis not present

## 2016-12-01 DIAGNOSIS — E440 Moderate protein-calorie malnutrition: Secondary | ICD-10-CM | POA: Diagnosis not present

## 2016-12-01 DIAGNOSIS — N186 End stage renal disease: Secondary | ICD-10-CM | POA: Diagnosis not present

## 2016-12-01 DIAGNOSIS — D631 Anemia in chronic kidney disease: Secondary | ICD-10-CM | POA: Diagnosis not present

## 2016-12-01 DIAGNOSIS — N2581 Secondary hyperparathyroidism of renal origin: Secondary | ICD-10-CM | POA: Diagnosis not present

## 2016-12-01 DIAGNOSIS — D509 Iron deficiency anemia, unspecified: Secondary | ICD-10-CM | POA: Diagnosis not present

## 2016-12-02 DIAGNOSIS — Z79899 Other long term (current) drug therapy: Secondary | ICD-10-CM | POA: Diagnosis not present

## 2016-12-02 DIAGNOSIS — N186 End stage renal disease: Secondary | ICD-10-CM | POA: Diagnosis not present

## 2016-12-02 DIAGNOSIS — N2581 Secondary hyperparathyroidism of renal origin: Secondary | ICD-10-CM | POA: Diagnosis not present

## 2016-12-02 DIAGNOSIS — N2889 Other specified disorders of kidney and ureter: Secondary | ICD-10-CM | POA: Diagnosis not present

## 2016-12-02 DIAGNOSIS — D631 Anemia in chronic kidney disease: Secondary | ICD-10-CM | POA: Diagnosis not present

## 2016-12-02 DIAGNOSIS — D509 Iron deficiency anemia, unspecified: Secondary | ICD-10-CM | POA: Diagnosis not present

## 2016-12-02 DIAGNOSIS — E440 Moderate protein-calorie malnutrition: Secondary | ICD-10-CM | POA: Diagnosis not present

## 2016-12-02 DIAGNOSIS — Z992 Dependence on renal dialysis: Secondary | ICD-10-CM | POA: Diagnosis not present

## 2016-12-03 DIAGNOSIS — D631 Anemia in chronic kidney disease: Secondary | ICD-10-CM | POA: Diagnosis not present

## 2016-12-03 DIAGNOSIS — D509 Iron deficiency anemia, unspecified: Secondary | ICD-10-CM | POA: Diagnosis not present

## 2016-12-03 DIAGNOSIS — N186 End stage renal disease: Secondary | ICD-10-CM | POA: Diagnosis not present

## 2016-12-03 DIAGNOSIS — N2581 Secondary hyperparathyroidism of renal origin: Secondary | ICD-10-CM | POA: Diagnosis not present

## 2016-12-03 DIAGNOSIS — Z4932 Encounter for adequacy testing for peritoneal dialysis: Secondary | ICD-10-CM | POA: Diagnosis not present

## 2016-12-04 DIAGNOSIS — N186 End stage renal disease: Secondary | ICD-10-CM | POA: Diagnosis not present

## 2016-12-04 DIAGNOSIS — D631 Anemia in chronic kidney disease: Secondary | ICD-10-CM | POA: Diagnosis not present

## 2016-12-04 DIAGNOSIS — D509 Iron deficiency anemia, unspecified: Secondary | ICD-10-CM | POA: Diagnosis not present

## 2016-12-04 DIAGNOSIS — Z4932 Encounter for adequacy testing for peritoneal dialysis: Secondary | ICD-10-CM | POA: Diagnosis not present

## 2016-12-04 DIAGNOSIS — N2581 Secondary hyperparathyroidism of renal origin: Secondary | ICD-10-CM | POA: Diagnosis not present

## 2016-12-05 DIAGNOSIS — N186 End stage renal disease: Secondary | ICD-10-CM | POA: Diagnosis not present

## 2016-12-05 DIAGNOSIS — D631 Anemia in chronic kidney disease: Secondary | ICD-10-CM | POA: Diagnosis not present

## 2016-12-05 DIAGNOSIS — D509 Iron deficiency anemia, unspecified: Secondary | ICD-10-CM | POA: Diagnosis not present

## 2016-12-05 DIAGNOSIS — N2581 Secondary hyperparathyroidism of renal origin: Secondary | ICD-10-CM | POA: Diagnosis not present

## 2016-12-05 DIAGNOSIS — Z4932 Encounter for adequacy testing for peritoneal dialysis: Secondary | ICD-10-CM | POA: Diagnosis not present

## 2016-12-06 DIAGNOSIS — E784 Other hyperlipidemia: Secondary | ICD-10-CM | POA: Diagnosis not present

## 2016-12-06 DIAGNOSIS — R8299 Other abnormal findings in urine: Secondary | ICD-10-CM | POA: Diagnosis not present

## 2016-12-06 DIAGNOSIS — N186 End stage renal disease: Secondary | ICD-10-CM | POA: Diagnosis not present

## 2016-12-06 DIAGNOSIS — D509 Iron deficiency anemia, unspecified: Secondary | ICD-10-CM | POA: Diagnosis not present

## 2016-12-06 DIAGNOSIS — D631 Anemia in chronic kidney disease: Secondary | ICD-10-CM | POA: Diagnosis not present

## 2016-12-06 DIAGNOSIS — N2581 Secondary hyperparathyroidism of renal origin: Secondary | ICD-10-CM | POA: Diagnosis not present

## 2016-12-06 DIAGNOSIS — Z4932 Encounter for adequacy testing for peritoneal dialysis: Secondary | ICD-10-CM | POA: Diagnosis not present

## 2016-12-07 DIAGNOSIS — D631 Anemia in chronic kidney disease: Secondary | ICD-10-CM | POA: Diagnosis not present

## 2016-12-07 DIAGNOSIS — N2581 Secondary hyperparathyroidism of renal origin: Secondary | ICD-10-CM | POA: Diagnosis not present

## 2016-12-07 DIAGNOSIS — Z4932 Encounter for adequacy testing for peritoneal dialysis: Secondary | ICD-10-CM | POA: Diagnosis not present

## 2016-12-07 DIAGNOSIS — N186 End stage renal disease: Secondary | ICD-10-CM | POA: Diagnosis not present

## 2016-12-07 DIAGNOSIS — D509 Iron deficiency anemia, unspecified: Secondary | ICD-10-CM | POA: Diagnosis not present

## 2016-12-08 DIAGNOSIS — Z4932 Encounter for adequacy testing for peritoneal dialysis: Secondary | ICD-10-CM | POA: Diagnosis not present

## 2016-12-08 DIAGNOSIS — N186 End stage renal disease: Secondary | ICD-10-CM | POA: Diagnosis not present

## 2016-12-08 DIAGNOSIS — D631 Anemia in chronic kidney disease: Secondary | ICD-10-CM | POA: Diagnosis not present

## 2016-12-08 DIAGNOSIS — D509 Iron deficiency anemia, unspecified: Secondary | ICD-10-CM | POA: Diagnosis not present

## 2016-12-08 DIAGNOSIS — N2581 Secondary hyperparathyroidism of renal origin: Secondary | ICD-10-CM | POA: Diagnosis not present

## 2016-12-09 DIAGNOSIS — N2581 Secondary hyperparathyroidism of renal origin: Secondary | ICD-10-CM | POA: Diagnosis not present

## 2016-12-09 DIAGNOSIS — Z4932 Encounter for adequacy testing for peritoneal dialysis: Secondary | ICD-10-CM | POA: Diagnosis not present

## 2016-12-09 DIAGNOSIS — D631 Anemia in chronic kidney disease: Secondary | ICD-10-CM | POA: Diagnosis not present

## 2016-12-09 DIAGNOSIS — D509 Iron deficiency anemia, unspecified: Secondary | ICD-10-CM | POA: Diagnosis not present

## 2016-12-09 DIAGNOSIS — N186 End stage renal disease: Secondary | ICD-10-CM | POA: Diagnosis not present

## 2016-12-10 DIAGNOSIS — Z4932 Encounter for adequacy testing for peritoneal dialysis: Secondary | ICD-10-CM | POA: Diagnosis not present

## 2016-12-10 DIAGNOSIS — D509 Iron deficiency anemia, unspecified: Secondary | ICD-10-CM | POA: Diagnosis not present

## 2016-12-10 DIAGNOSIS — N2581 Secondary hyperparathyroidism of renal origin: Secondary | ICD-10-CM | POA: Diagnosis not present

## 2016-12-10 DIAGNOSIS — D631 Anemia in chronic kidney disease: Secondary | ICD-10-CM | POA: Diagnosis not present

## 2016-12-10 DIAGNOSIS — N186 End stage renal disease: Secondary | ICD-10-CM | POA: Diagnosis not present

## 2016-12-11 DIAGNOSIS — D509 Iron deficiency anemia, unspecified: Secondary | ICD-10-CM | POA: Diagnosis not present

## 2016-12-11 DIAGNOSIS — N2581 Secondary hyperparathyroidism of renal origin: Secondary | ICD-10-CM | POA: Diagnosis not present

## 2016-12-11 DIAGNOSIS — N186 End stage renal disease: Secondary | ICD-10-CM | POA: Diagnosis not present

## 2016-12-11 DIAGNOSIS — Z4932 Encounter for adequacy testing for peritoneal dialysis: Secondary | ICD-10-CM | POA: Diagnosis not present

## 2016-12-11 DIAGNOSIS — D49511 Neoplasm of unspecified behavior of right kidney: Secondary | ICD-10-CM | POA: Diagnosis not present

## 2016-12-11 DIAGNOSIS — D631 Anemia in chronic kidney disease: Secondary | ICD-10-CM | POA: Diagnosis not present

## 2016-12-11 DIAGNOSIS — D49512 Neoplasm of unspecified behavior of left kidney: Secondary | ICD-10-CM | POA: Diagnosis not present

## 2016-12-11 DIAGNOSIS — R310 Gross hematuria: Secondary | ICD-10-CM | POA: Diagnosis not present

## 2016-12-12 DIAGNOSIS — D509 Iron deficiency anemia, unspecified: Secondary | ICD-10-CM | POA: Diagnosis not present

## 2016-12-12 DIAGNOSIS — N186 End stage renal disease: Secondary | ICD-10-CM | POA: Diagnosis not present

## 2016-12-12 DIAGNOSIS — Z4932 Encounter for adequacy testing for peritoneal dialysis: Secondary | ICD-10-CM | POA: Diagnosis not present

## 2016-12-12 DIAGNOSIS — N2581 Secondary hyperparathyroidism of renal origin: Secondary | ICD-10-CM | POA: Diagnosis not present

## 2016-12-12 DIAGNOSIS — D631 Anemia in chronic kidney disease: Secondary | ICD-10-CM | POA: Diagnosis not present

## 2016-12-13 DIAGNOSIS — D509 Iron deficiency anemia, unspecified: Secondary | ICD-10-CM | POA: Diagnosis not present

## 2016-12-13 DIAGNOSIS — Z4932 Encounter for adequacy testing for peritoneal dialysis: Secondary | ICD-10-CM | POA: Diagnosis not present

## 2016-12-13 DIAGNOSIS — N2581 Secondary hyperparathyroidism of renal origin: Secondary | ICD-10-CM | POA: Diagnosis not present

## 2016-12-13 DIAGNOSIS — N186 End stage renal disease: Secondary | ICD-10-CM | POA: Diagnosis not present

## 2016-12-13 DIAGNOSIS — D631 Anemia in chronic kidney disease: Secondary | ICD-10-CM | POA: Diagnosis not present

## 2016-12-14 DIAGNOSIS — D509 Iron deficiency anemia, unspecified: Secondary | ICD-10-CM | POA: Diagnosis not present

## 2016-12-14 DIAGNOSIS — N261 Atrophy of kidney (terminal): Secondary | ICD-10-CM | POA: Diagnosis not present

## 2016-12-14 DIAGNOSIS — Z4932 Encounter for adequacy testing for peritoneal dialysis: Secondary | ICD-10-CM | POA: Diagnosis not present

## 2016-12-14 DIAGNOSIS — D631 Anemia in chronic kidney disease: Secondary | ICD-10-CM | POA: Diagnosis not present

## 2016-12-14 DIAGNOSIS — I701 Atherosclerosis of renal artery: Secondary | ICD-10-CM | POA: Diagnosis not present

## 2016-12-14 DIAGNOSIS — N2889 Other specified disorders of kidney and ureter: Secondary | ICD-10-CM | POA: Diagnosis not present

## 2016-12-14 DIAGNOSIS — N186 End stage renal disease: Secondary | ICD-10-CM | POA: Diagnosis not present

## 2016-12-14 DIAGNOSIS — N2581 Secondary hyperparathyroidism of renal origin: Secondary | ICD-10-CM | POA: Diagnosis not present

## 2016-12-15 DIAGNOSIS — D509 Iron deficiency anemia, unspecified: Secondary | ICD-10-CM | POA: Diagnosis not present

## 2016-12-15 DIAGNOSIS — N2581 Secondary hyperparathyroidism of renal origin: Secondary | ICD-10-CM | POA: Diagnosis not present

## 2016-12-15 DIAGNOSIS — D631 Anemia in chronic kidney disease: Secondary | ICD-10-CM | POA: Diagnosis not present

## 2016-12-15 DIAGNOSIS — Z4932 Encounter for adequacy testing for peritoneal dialysis: Secondary | ICD-10-CM | POA: Diagnosis not present

## 2016-12-15 DIAGNOSIS — N186 End stage renal disease: Secondary | ICD-10-CM | POA: Diagnosis not present

## 2016-12-16 DIAGNOSIS — Z4932 Encounter for adequacy testing for peritoneal dialysis: Secondary | ICD-10-CM | POA: Diagnosis not present

## 2016-12-16 DIAGNOSIS — N2581 Secondary hyperparathyroidism of renal origin: Secondary | ICD-10-CM | POA: Diagnosis not present

## 2016-12-16 DIAGNOSIS — D509 Iron deficiency anemia, unspecified: Secondary | ICD-10-CM | POA: Diagnosis not present

## 2016-12-16 DIAGNOSIS — N186 End stage renal disease: Secondary | ICD-10-CM | POA: Diagnosis not present

## 2016-12-16 DIAGNOSIS — D631 Anemia in chronic kidney disease: Secondary | ICD-10-CM | POA: Diagnosis not present

## 2016-12-17 DIAGNOSIS — N186 End stage renal disease: Secondary | ICD-10-CM | POA: Diagnosis not present

## 2016-12-17 DIAGNOSIS — D509 Iron deficiency anemia, unspecified: Secondary | ICD-10-CM | POA: Diagnosis not present

## 2016-12-17 DIAGNOSIS — N2581 Secondary hyperparathyroidism of renal origin: Secondary | ICD-10-CM | POA: Diagnosis not present

## 2016-12-17 DIAGNOSIS — Z4932 Encounter for adequacy testing for peritoneal dialysis: Secondary | ICD-10-CM | POA: Diagnosis not present

## 2016-12-17 DIAGNOSIS — D631 Anemia in chronic kidney disease: Secondary | ICD-10-CM | POA: Diagnosis not present

## 2016-12-18 DIAGNOSIS — N186 End stage renal disease: Secondary | ICD-10-CM | POA: Diagnosis not present

## 2016-12-18 DIAGNOSIS — D509 Iron deficiency anemia, unspecified: Secondary | ICD-10-CM | POA: Diagnosis not present

## 2016-12-18 DIAGNOSIS — Z4932 Encounter for adequacy testing for peritoneal dialysis: Secondary | ICD-10-CM | POA: Diagnosis not present

## 2016-12-18 DIAGNOSIS — N2581 Secondary hyperparathyroidism of renal origin: Secondary | ICD-10-CM | POA: Diagnosis not present

## 2016-12-18 DIAGNOSIS — D631 Anemia in chronic kidney disease: Secondary | ICD-10-CM | POA: Diagnosis not present

## 2016-12-19 DIAGNOSIS — Z4932 Encounter for adequacy testing for peritoneal dialysis: Secondary | ICD-10-CM | POA: Diagnosis not present

## 2016-12-19 DIAGNOSIS — N186 End stage renal disease: Secondary | ICD-10-CM | POA: Diagnosis not present

## 2016-12-19 DIAGNOSIS — D509 Iron deficiency anemia, unspecified: Secondary | ICD-10-CM | POA: Diagnosis not present

## 2016-12-19 DIAGNOSIS — N2581 Secondary hyperparathyroidism of renal origin: Secondary | ICD-10-CM | POA: Diagnosis not present

## 2016-12-19 DIAGNOSIS — D631 Anemia in chronic kidney disease: Secondary | ICD-10-CM | POA: Diagnosis not present

## 2016-12-20 DIAGNOSIS — N2581 Secondary hyperparathyroidism of renal origin: Secondary | ICD-10-CM | POA: Diagnosis not present

## 2016-12-20 DIAGNOSIS — Z4932 Encounter for adequacy testing for peritoneal dialysis: Secondary | ICD-10-CM | POA: Diagnosis not present

## 2016-12-20 DIAGNOSIS — D631 Anemia in chronic kidney disease: Secondary | ICD-10-CM | POA: Diagnosis not present

## 2016-12-20 DIAGNOSIS — N186 End stage renal disease: Secondary | ICD-10-CM | POA: Diagnosis not present

## 2016-12-20 DIAGNOSIS — D509 Iron deficiency anemia, unspecified: Secondary | ICD-10-CM | POA: Diagnosis not present

## 2016-12-21 DIAGNOSIS — N2889 Other specified disorders of kidney and ureter: Secondary | ICD-10-CM | POA: Diagnosis not present

## 2016-12-21 DIAGNOSIS — N186 End stage renal disease: Secondary | ICD-10-CM | POA: Diagnosis not present

## 2016-12-21 DIAGNOSIS — Z4932 Encounter for adequacy testing for peritoneal dialysis: Secondary | ICD-10-CM | POA: Diagnosis not present

## 2016-12-21 DIAGNOSIS — Z992 Dependence on renal dialysis: Secondary | ICD-10-CM | POA: Diagnosis not present

## 2016-12-21 DIAGNOSIS — D509 Iron deficiency anemia, unspecified: Secondary | ICD-10-CM | POA: Diagnosis not present

## 2016-12-21 DIAGNOSIS — Z01818 Encounter for other preprocedural examination: Secondary | ICD-10-CM | POA: Diagnosis not present

## 2016-12-21 DIAGNOSIS — N2581 Secondary hyperparathyroidism of renal origin: Secondary | ICD-10-CM | POA: Diagnosis not present

## 2016-12-21 DIAGNOSIS — D631 Anemia in chronic kidney disease: Secondary | ICD-10-CM | POA: Diagnosis not present

## 2016-12-22 DIAGNOSIS — Z4932 Encounter for adequacy testing for peritoneal dialysis: Secondary | ICD-10-CM | POA: Diagnosis not present

## 2016-12-22 DIAGNOSIS — D631 Anemia in chronic kidney disease: Secondary | ICD-10-CM | POA: Diagnosis not present

## 2016-12-22 DIAGNOSIS — N2581 Secondary hyperparathyroidism of renal origin: Secondary | ICD-10-CM | POA: Diagnosis not present

## 2016-12-22 DIAGNOSIS — N186 End stage renal disease: Secondary | ICD-10-CM | POA: Diagnosis not present

## 2016-12-22 DIAGNOSIS — D509 Iron deficiency anemia, unspecified: Secondary | ICD-10-CM | POA: Diagnosis not present

## 2016-12-23 DIAGNOSIS — Z4932 Encounter for adequacy testing for peritoneal dialysis: Secondary | ICD-10-CM | POA: Diagnosis not present

## 2016-12-23 DIAGNOSIS — N2581 Secondary hyperparathyroidism of renal origin: Secondary | ICD-10-CM | POA: Diagnosis not present

## 2016-12-23 DIAGNOSIS — D509 Iron deficiency anemia, unspecified: Secondary | ICD-10-CM | POA: Diagnosis not present

## 2016-12-23 DIAGNOSIS — N186 End stage renal disease: Secondary | ICD-10-CM | POA: Diagnosis not present

## 2016-12-23 DIAGNOSIS — D631 Anemia in chronic kidney disease: Secondary | ICD-10-CM | POA: Diagnosis not present

## 2016-12-24 DIAGNOSIS — N2581 Secondary hyperparathyroidism of renal origin: Secondary | ICD-10-CM | POA: Diagnosis not present

## 2016-12-24 DIAGNOSIS — N186 End stage renal disease: Secondary | ICD-10-CM | POA: Diagnosis not present

## 2016-12-24 DIAGNOSIS — Z4932 Encounter for adequacy testing for peritoneal dialysis: Secondary | ICD-10-CM | POA: Diagnosis not present

## 2016-12-24 DIAGNOSIS — D509 Iron deficiency anemia, unspecified: Secondary | ICD-10-CM | POA: Diagnosis not present

## 2016-12-24 DIAGNOSIS — D631 Anemia in chronic kidney disease: Secondary | ICD-10-CM | POA: Diagnosis not present

## 2016-12-25 DIAGNOSIS — D631 Anemia in chronic kidney disease: Secondary | ICD-10-CM | POA: Diagnosis not present

## 2016-12-25 DIAGNOSIS — D509 Iron deficiency anemia, unspecified: Secondary | ICD-10-CM | POA: Diagnosis not present

## 2016-12-25 DIAGNOSIS — N2581 Secondary hyperparathyroidism of renal origin: Secondary | ICD-10-CM | POA: Diagnosis not present

## 2016-12-25 DIAGNOSIS — Z4932 Encounter for adequacy testing for peritoneal dialysis: Secondary | ICD-10-CM | POA: Diagnosis not present

## 2016-12-25 DIAGNOSIS — N186 End stage renal disease: Secondary | ICD-10-CM | POA: Diagnosis not present

## 2016-12-26 DIAGNOSIS — D509 Iron deficiency anemia, unspecified: Secondary | ICD-10-CM | POA: Diagnosis not present

## 2016-12-26 DIAGNOSIS — N186 End stage renal disease: Secondary | ICD-10-CM | POA: Diagnosis not present

## 2016-12-26 DIAGNOSIS — D631 Anemia in chronic kidney disease: Secondary | ICD-10-CM | POA: Diagnosis not present

## 2016-12-26 DIAGNOSIS — N2581 Secondary hyperparathyroidism of renal origin: Secondary | ICD-10-CM | POA: Diagnosis not present

## 2016-12-26 DIAGNOSIS — Z4932 Encounter for adequacy testing for peritoneal dialysis: Secondary | ICD-10-CM | POA: Diagnosis not present

## 2016-12-27 DIAGNOSIS — D631 Anemia in chronic kidney disease: Secondary | ICD-10-CM | POA: Diagnosis not present

## 2016-12-27 DIAGNOSIS — D509 Iron deficiency anemia, unspecified: Secondary | ICD-10-CM | POA: Diagnosis not present

## 2016-12-27 DIAGNOSIS — N186 End stage renal disease: Secondary | ICD-10-CM | POA: Diagnosis not present

## 2016-12-27 DIAGNOSIS — N2581 Secondary hyperparathyroidism of renal origin: Secondary | ICD-10-CM | POA: Diagnosis not present

## 2016-12-27 DIAGNOSIS — Z4932 Encounter for adequacy testing for peritoneal dialysis: Secondary | ICD-10-CM | POA: Diagnosis not present

## 2016-12-28 DIAGNOSIS — D631 Anemia in chronic kidney disease: Secondary | ICD-10-CM | POA: Diagnosis not present

## 2016-12-28 DIAGNOSIS — D509 Iron deficiency anemia, unspecified: Secondary | ICD-10-CM | POA: Diagnosis not present

## 2016-12-28 DIAGNOSIS — N186 End stage renal disease: Secondary | ICD-10-CM | POA: Diagnosis not present

## 2016-12-28 DIAGNOSIS — Z4932 Encounter for adequacy testing for peritoneal dialysis: Secondary | ICD-10-CM | POA: Diagnosis not present

## 2016-12-28 DIAGNOSIS — N2581 Secondary hyperparathyroidism of renal origin: Secondary | ICD-10-CM | POA: Diagnosis not present

## 2016-12-29 DIAGNOSIS — N2581 Secondary hyperparathyroidism of renal origin: Secondary | ICD-10-CM | POA: Diagnosis not present

## 2016-12-29 DIAGNOSIS — D631 Anemia in chronic kidney disease: Secondary | ICD-10-CM | POA: Diagnosis not present

## 2016-12-29 DIAGNOSIS — N186 End stage renal disease: Secondary | ICD-10-CM | POA: Diagnosis not present

## 2016-12-29 DIAGNOSIS — D509 Iron deficiency anemia, unspecified: Secondary | ICD-10-CM | POA: Diagnosis not present

## 2016-12-29 DIAGNOSIS — Z4932 Encounter for adequacy testing for peritoneal dialysis: Secondary | ICD-10-CM | POA: Diagnosis not present

## 2016-12-30 DIAGNOSIS — D631 Anemia in chronic kidney disease: Secondary | ICD-10-CM | POA: Diagnosis not present

## 2016-12-30 DIAGNOSIS — Z4932 Encounter for adequacy testing for peritoneal dialysis: Secondary | ICD-10-CM | POA: Diagnosis not present

## 2016-12-30 DIAGNOSIS — N186 End stage renal disease: Secondary | ICD-10-CM | POA: Diagnosis not present

## 2016-12-30 DIAGNOSIS — D509 Iron deficiency anemia, unspecified: Secondary | ICD-10-CM | POA: Diagnosis not present

## 2016-12-30 DIAGNOSIS — N2581 Secondary hyperparathyroidism of renal origin: Secondary | ICD-10-CM | POA: Diagnosis not present

## 2016-12-31 DIAGNOSIS — N186 End stage renal disease: Secondary | ICD-10-CM | POA: Diagnosis not present

## 2016-12-31 DIAGNOSIS — D631 Anemia in chronic kidney disease: Secondary | ICD-10-CM | POA: Diagnosis not present

## 2016-12-31 DIAGNOSIS — N2581 Secondary hyperparathyroidism of renal origin: Secondary | ICD-10-CM | POA: Diagnosis not present

## 2016-12-31 DIAGNOSIS — D509 Iron deficiency anemia, unspecified: Secondary | ICD-10-CM | POA: Diagnosis not present

## 2016-12-31 DIAGNOSIS — Z4932 Encounter for adequacy testing for peritoneal dialysis: Secondary | ICD-10-CM | POA: Diagnosis not present

## 2017-01-01 DIAGNOSIS — Z4932 Encounter for adequacy testing for peritoneal dialysis: Secondary | ICD-10-CM | POA: Diagnosis not present

## 2017-01-01 DIAGNOSIS — N2581 Secondary hyperparathyroidism of renal origin: Secondary | ICD-10-CM | POA: Diagnosis not present

## 2017-01-01 DIAGNOSIS — D509 Iron deficiency anemia, unspecified: Secondary | ICD-10-CM | POA: Diagnosis not present

## 2017-01-01 DIAGNOSIS — N186 End stage renal disease: Secondary | ICD-10-CM | POA: Diagnosis not present

## 2017-01-01 DIAGNOSIS — D631 Anemia in chronic kidney disease: Secondary | ICD-10-CM | POA: Diagnosis not present

## 2017-01-01 DIAGNOSIS — N2889 Other specified disorders of kidney and ureter: Secondary | ICD-10-CM | POA: Diagnosis not present

## 2017-01-02 DIAGNOSIS — N2581 Secondary hyperparathyroidism of renal origin: Secondary | ICD-10-CM | POA: Diagnosis not present

## 2017-01-02 DIAGNOSIS — E440 Moderate protein-calorie malnutrition: Secondary | ICD-10-CM | POA: Diagnosis not present

## 2017-01-02 DIAGNOSIS — D509 Iron deficiency anemia, unspecified: Secondary | ICD-10-CM | POA: Diagnosis not present

## 2017-01-02 DIAGNOSIS — N186 End stage renal disease: Secondary | ICD-10-CM | POA: Diagnosis not present

## 2017-01-02 DIAGNOSIS — Z79899 Other long term (current) drug therapy: Secondary | ICD-10-CM | POA: Diagnosis not present

## 2017-01-02 DIAGNOSIS — D631 Anemia in chronic kidney disease: Secondary | ICD-10-CM | POA: Diagnosis not present

## 2017-01-03 DIAGNOSIS — N186 End stage renal disease: Secondary | ICD-10-CM | POA: Diagnosis not present

## 2017-01-03 DIAGNOSIS — E440 Moderate protein-calorie malnutrition: Secondary | ICD-10-CM | POA: Diagnosis not present

## 2017-01-03 DIAGNOSIS — R8299 Other abnormal findings in urine: Secondary | ICD-10-CM | POA: Diagnosis not present

## 2017-01-03 DIAGNOSIS — Z79899 Other long term (current) drug therapy: Secondary | ICD-10-CM | POA: Diagnosis not present

## 2017-01-03 DIAGNOSIS — D631 Anemia in chronic kidney disease: Secondary | ICD-10-CM | POA: Diagnosis not present

## 2017-01-03 DIAGNOSIS — D509 Iron deficiency anemia, unspecified: Secondary | ICD-10-CM | POA: Diagnosis not present

## 2017-01-03 DIAGNOSIS — N2581 Secondary hyperparathyroidism of renal origin: Secondary | ICD-10-CM | POA: Diagnosis not present

## 2017-01-04 DIAGNOSIS — N2581 Secondary hyperparathyroidism of renal origin: Secondary | ICD-10-CM | POA: Diagnosis not present

## 2017-01-04 DIAGNOSIS — E440 Moderate protein-calorie malnutrition: Secondary | ICD-10-CM | POA: Diagnosis not present

## 2017-01-04 DIAGNOSIS — Z79899 Other long term (current) drug therapy: Secondary | ICD-10-CM | POA: Diagnosis not present

## 2017-01-04 DIAGNOSIS — N186 End stage renal disease: Secondary | ICD-10-CM | POA: Diagnosis not present

## 2017-01-04 DIAGNOSIS — D631 Anemia in chronic kidney disease: Secondary | ICD-10-CM | POA: Diagnosis not present

## 2017-01-04 DIAGNOSIS — D509 Iron deficiency anemia, unspecified: Secondary | ICD-10-CM | POA: Diagnosis not present

## 2017-01-05 DIAGNOSIS — D631 Anemia in chronic kidney disease: Secondary | ICD-10-CM | POA: Diagnosis not present

## 2017-01-05 DIAGNOSIS — N186 End stage renal disease: Secondary | ICD-10-CM | POA: Diagnosis not present

## 2017-01-05 DIAGNOSIS — E440 Moderate protein-calorie malnutrition: Secondary | ICD-10-CM | POA: Diagnosis not present

## 2017-01-05 DIAGNOSIS — D509 Iron deficiency anemia, unspecified: Secondary | ICD-10-CM | POA: Diagnosis not present

## 2017-01-05 DIAGNOSIS — N2581 Secondary hyperparathyroidism of renal origin: Secondary | ICD-10-CM | POA: Diagnosis not present

## 2017-01-05 DIAGNOSIS — Z79899 Other long term (current) drug therapy: Secondary | ICD-10-CM | POA: Diagnosis not present

## 2017-01-06 DIAGNOSIS — D631 Anemia in chronic kidney disease: Secondary | ICD-10-CM | POA: Diagnosis not present

## 2017-01-06 DIAGNOSIS — N2581 Secondary hyperparathyroidism of renal origin: Secondary | ICD-10-CM | POA: Diagnosis not present

## 2017-01-06 DIAGNOSIS — Z79899 Other long term (current) drug therapy: Secondary | ICD-10-CM | POA: Diagnosis not present

## 2017-01-06 DIAGNOSIS — N186 End stage renal disease: Secondary | ICD-10-CM | POA: Diagnosis not present

## 2017-01-06 DIAGNOSIS — D509 Iron deficiency anemia, unspecified: Secondary | ICD-10-CM | POA: Diagnosis not present

## 2017-01-06 DIAGNOSIS — E440 Moderate protein-calorie malnutrition: Secondary | ICD-10-CM | POA: Diagnosis not present

## 2017-01-07 DIAGNOSIS — D631 Anemia in chronic kidney disease: Secondary | ICD-10-CM | POA: Diagnosis not present

## 2017-01-07 DIAGNOSIS — D509 Iron deficiency anemia, unspecified: Secondary | ICD-10-CM | POA: Diagnosis not present

## 2017-01-07 DIAGNOSIS — Z79899 Other long term (current) drug therapy: Secondary | ICD-10-CM | POA: Diagnosis not present

## 2017-01-07 DIAGNOSIS — N2581 Secondary hyperparathyroidism of renal origin: Secondary | ICD-10-CM | POA: Diagnosis not present

## 2017-01-07 DIAGNOSIS — E440 Moderate protein-calorie malnutrition: Secondary | ICD-10-CM | POA: Diagnosis not present

## 2017-01-07 DIAGNOSIS — N186 End stage renal disease: Secondary | ICD-10-CM | POA: Diagnosis not present

## 2017-01-08 DIAGNOSIS — N2581 Secondary hyperparathyroidism of renal origin: Secondary | ICD-10-CM | POA: Diagnosis not present

## 2017-01-08 DIAGNOSIS — E440 Moderate protein-calorie malnutrition: Secondary | ICD-10-CM | POA: Diagnosis not present

## 2017-01-08 DIAGNOSIS — Z79899 Other long term (current) drug therapy: Secondary | ICD-10-CM | POA: Diagnosis not present

## 2017-01-08 DIAGNOSIS — D631 Anemia in chronic kidney disease: Secondary | ICD-10-CM | POA: Diagnosis not present

## 2017-01-08 DIAGNOSIS — N186 End stage renal disease: Secondary | ICD-10-CM | POA: Diagnosis not present

## 2017-01-08 DIAGNOSIS — D509 Iron deficiency anemia, unspecified: Secondary | ICD-10-CM | POA: Diagnosis not present

## 2017-01-09 DIAGNOSIS — D631 Anemia in chronic kidney disease: Secondary | ICD-10-CM | POA: Diagnosis not present

## 2017-01-09 DIAGNOSIS — Z79899 Other long term (current) drug therapy: Secondary | ICD-10-CM | POA: Diagnosis not present

## 2017-01-09 DIAGNOSIS — N186 End stage renal disease: Secondary | ICD-10-CM | POA: Diagnosis not present

## 2017-01-09 DIAGNOSIS — D509 Iron deficiency anemia, unspecified: Secondary | ICD-10-CM | POA: Diagnosis not present

## 2017-01-09 DIAGNOSIS — E440 Moderate protein-calorie malnutrition: Secondary | ICD-10-CM | POA: Diagnosis not present

## 2017-01-09 DIAGNOSIS — N2581 Secondary hyperparathyroidism of renal origin: Secondary | ICD-10-CM | POA: Diagnosis not present

## 2017-01-10 DIAGNOSIS — D631 Anemia in chronic kidney disease: Secondary | ICD-10-CM | POA: Diagnosis not present

## 2017-01-10 DIAGNOSIS — N186 End stage renal disease: Secondary | ICD-10-CM | POA: Diagnosis not present

## 2017-01-10 DIAGNOSIS — N2581 Secondary hyperparathyroidism of renal origin: Secondary | ICD-10-CM | POA: Diagnosis not present

## 2017-01-10 DIAGNOSIS — Z79899 Other long term (current) drug therapy: Secondary | ICD-10-CM | POA: Diagnosis not present

## 2017-01-10 DIAGNOSIS — E440 Moderate protein-calorie malnutrition: Secondary | ICD-10-CM | POA: Diagnosis not present

## 2017-01-10 DIAGNOSIS — D509 Iron deficiency anemia, unspecified: Secondary | ICD-10-CM | POA: Diagnosis not present

## 2017-01-11 DIAGNOSIS — N186 End stage renal disease: Secondary | ICD-10-CM | POA: Diagnosis not present

## 2017-01-11 DIAGNOSIS — Z79899 Other long term (current) drug therapy: Secondary | ICD-10-CM | POA: Diagnosis not present

## 2017-01-11 DIAGNOSIS — E440 Moderate protein-calorie malnutrition: Secondary | ICD-10-CM | POA: Diagnosis not present

## 2017-01-11 DIAGNOSIS — D631 Anemia in chronic kidney disease: Secondary | ICD-10-CM | POA: Diagnosis not present

## 2017-01-11 DIAGNOSIS — N2581 Secondary hyperparathyroidism of renal origin: Secondary | ICD-10-CM | POA: Diagnosis not present

## 2017-01-11 DIAGNOSIS — D509 Iron deficiency anemia, unspecified: Secondary | ICD-10-CM | POA: Diagnosis not present

## 2017-01-12 DIAGNOSIS — D509 Iron deficiency anemia, unspecified: Secondary | ICD-10-CM | POA: Diagnosis not present

## 2017-01-12 DIAGNOSIS — E440 Moderate protein-calorie malnutrition: Secondary | ICD-10-CM | POA: Diagnosis not present

## 2017-01-12 DIAGNOSIS — N186 End stage renal disease: Secondary | ICD-10-CM | POA: Diagnosis not present

## 2017-01-12 DIAGNOSIS — N2581 Secondary hyperparathyroidism of renal origin: Secondary | ICD-10-CM | POA: Diagnosis not present

## 2017-01-12 DIAGNOSIS — D631 Anemia in chronic kidney disease: Secondary | ICD-10-CM | POA: Diagnosis not present

## 2017-01-12 DIAGNOSIS — Z79899 Other long term (current) drug therapy: Secondary | ICD-10-CM | POA: Diagnosis not present

## 2017-01-13 DIAGNOSIS — N2581 Secondary hyperparathyroidism of renal origin: Secondary | ICD-10-CM | POA: Diagnosis not present

## 2017-01-13 DIAGNOSIS — Z79899 Other long term (current) drug therapy: Secondary | ICD-10-CM | POA: Diagnosis not present

## 2017-01-13 DIAGNOSIS — E440 Moderate protein-calorie malnutrition: Secondary | ICD-10-CM | POA: Diagnosis not present

## 2017-01-13 DIAGNOSIS — D509 Iron deficiency anemia, unspecified: Secondary | ICD-10-CM | POA: Diagnosis not present

## 2017-01-13 DIAGNOSIS — N186 End stage renal disease: Secondary | ICD-10-CM | POA: Diagnosis not present

## 2017-01-13 DIAGNOSIS — D631 Anemia in chronic kidney disease: Secondary | ICD-10-CM | POA: Diagnosis not present

## 2017-01-14 DIAGNOSIS — N2581 Secondary hyperparathyroidism of renal origin: Secondary | ICD-10-CM | POA: Diagnosis not present

## 2017-01-14 DIAGNOSIS — Z79899 Other long term (current) drug therapy: Secondary | ICD-10-CM | POA: Diagnosis not present

## 2017-01-14 DIAGNOSIS — D631 Anemia in chronic kidney disease: Secondary | ICD-10-CM | POA: Diagnosis not present

## 2017-01-14 DIAGNOSIS — D509 Iron deficiency anemia, unspecified: Secondary | ICD-10-CM | POA: Diagnosis not present

## 2017-01-14 DIAGNOSIS — E440 Moderate protein-calorie malnutrition: Secondary | ICD-10-CM | POA: Diagnosis not present

## 2017-01-14 DIAGNOSIS — N186 End stage renal disease: Secondary | ICD-10-CM | POA: Diagnosis not present

## 2017-01-15 DIAGNOSIS — N2581 Secondary hyperparathyroidism of renal origin: Secondary | ICD-10-CM | POA: Diagnosis not present

## 2017-01-15 DIAGNOSIS — E440 Moderate protein-calorie malnutrition: Secondary | ICD-10-CM | POA: Diagnosis not present

## 2017-01-15 DIAGNOSIS — Z79899 Other long term (current) drug therapy: Secondary | ICD-10-CM | POA: Diagnosis not present

## 2017-01-15 DIAGNOSIS — D509 Iron deficiency anemia, unspecified: Secondary | ICD-10-CM | POA: Diagnosis not present

## 2017-01-15 DIAGNOSIS — D631 Anemia in chronic kidney disease: Secondary | ICD-10-CM | POA: Diagnosis not present

## 2017-01-15 DIAGNOSIS — N186 End stage renal disease: Secondary | ICD-10-CM | POA: Diagnosis not present

## 2017-01-16 DIAGNOSIS — E440 Moderate protein-calorie malnutrition: Secondary | ICD-10-CM | POA: Diagnosis not present

## 2017-01-16 DIAGNOSIS — N186 End stage renal disease: Secondary | ICD-10-CM | POA: Diagnosis not present

## 2017-01-16 DIAGNOSIS — D509 Iron deficiency anemia, unspecified: Secondary | ICD-10-CM | POA: Diagnosis not present

## 2017-01-16 DIAGNOSIS — N2581 Secondary hyperparathyroidism of renal origin: Secondary | ICD-10-CM | POA: Diagnosis not present

## 2017-01-16 DIAGNOSIS — D631 Anemia in chronic kidney disease: Secondary | ICD-10-CM | POA: Diagnosis not present

## 2017-01-16 DIAGNOSIS — Z79899 Other long term (current) drug therapy: Secondary | ICD-10-CM | POA: Diagnosis not present

## 2017-01-17 DIAGNOSIS — D631 Anemia in chronic kidney disease: Secondary | ICD-10-CM | POA: Diagnosis not present

## 2017-01-17 DIAGNOSIS — E440 Moderate protein-calorie malnutrition: Secondary | ICD-10-CM | POA: Diagnosis not present

## 2017-01-17 DIAGNOSIS — N186 End stage renal disease: Secondary | ICD-10-CM | POA: Diagnosis not present

## 2017-01-17 DIAGNOSIS — N2581 Secondary hyperparathyroidism of renal origin: Secondary | ICD-10-CM | POA: Diagnosis not present

## 2017-01-17 DIAGNOSIS — D509 Iron deficiency anemia, unspecified: Secondary | ICD-10-CM | POA: Diagnosis not present

## 2017-01-17 DIAGNOSIS — Z79899 Other long term (current) drug therapy: Secondary | ICD-10-CM | POA: Diagnosis not present

## 2017-01-18 DIAGNOSIS — N2581 Secondary hyperparathyroidism of renal origin: Secondary | ICD-10-CM | POA: Diagnosis not present

## 2017-01-18 DIAGNOSIS — N186 End stage renal disease: Secondary | ICD-10-CM | POA: Diagnosis not present

## 2017-01-18 DIAGNOSIS — D509 Iron deficiency anemia, unspecified: Secondary | ICD-10-CM | POA: Diagnosis not present

## 2017-01-18 DIAGNOSIS — E440 Moderate protein-calorie malnutrition: Secondary | ICD-10-CM | POA: Diagnosis not present

## 2017-01-18 DIAGNOSIS — Z79899 Other long term (current) drug therapy: Secondary | ICD-10-CM | POA: Diagnosis not present

## 2017-01-18 DIAGNOSIS — D631 Anemia in chronic kidney disease: Secondary | ICD-10-CM | POA: Diagnosis not present

## 2017-01-19 DIAGNOSIS — N186 End stage renal disease: Secondary | ICD-10-CM | POA: Diagnosis not present

## 2017-01-19 DIAGNOSIS — N2581 Secondary hyperparathyroidism of renal origin: Secondary | ICD-10-CM | POA: Diagnosis not present

## 2017-01-19 DIAGNOSIS — D509 Iron deficiency anemia, unspecified: Secondary | ICD-10-CM | POA: Diagnosis not present

## 2017-01-19 DIAGNOSIS — Z79899 Other long term (current) drug therapy: Secondary | ICD-10-CM | POA: Diagnosis not present

## 2017-01-19 DIAGNOSIS — E440 Moderate protein-calorie malnutrition: Secondary | ICD-10-CM | POA: Diagnosis not present

## 2017-01-19 DIAGNOSIS — D631 Anemia in chronic kidney disease: Secondary | ICD-10-CM | POA: Diagnosis not present

## 2017-01-20 DIAGNOSIS — D631 Anemia in chronic kidney disease: Secondary | ICD-10-CM | POA: Diagnosis not present

## 2017-01-20 DIAGNOSIS — N186 End stage renal disease: Secondary | ICD-10-CM | POA: Diagnosis not present

## 2017-01-20 DIAGNOSIS — D509 Iron deficiency anemia, unspecified: Secondary | ICD-10-CM | POA: Diagnosis not present

## 2017-01-20 DIAGNOSIS — E440 Moderate protein-calorie malnutrition: Secondary | ICD-10-CM | POA: Diagnosis not present

## 2017-01-20 DIAGNOSIS — N2581 Secondary hyperparathyroidism of renal origin: Secondary | ICD-10-CM | POA: Diagnosis not present

## 2017-01-20 DIAGNOSIS — Z79899 Other long term (current) drug therapy: Secondary | ICD-10-CM | POA: Diagnosis not present

## 2017-01-21 DIAGNOSIS — N2581 Secondary hyperparathyroidism of renal origin: Secondary | ICD-10-CM | POA: Diagnosis not present

## 2017-01-21 DIAGNOSIS — N186 End stage renal disease: Secondary | ICD-10-CM | POA: Diagnosis not present

## 2017-01-21 DIAGNOSIS — Z79899 Other long term (current) drug therapy: Secondary | ICD-10-CM | POA: Diagnosis not present

## 2017-01-21 DIAGNOSIS — D631 Anemia in chronic kidney disease: Secondary | ICD-10-CM | POA: Diagnosis not present

## 2017-01-21 DIAGNOSIS — E440 Moderate protein-calorie malnutrition: Secondary | ICD-10-CM | POA: Diagnosis not present

## 2017-01-21 DIAGNOSIS — D509 Iron deficiency anemia, unspecified: Secondary | ICD-10-CM | POA: Diagnosis not present

## 2017-01-22 DIAGNOSIS — N2581 Secondary hyperparathyroidism of renal origin: Secondary | ICD-10-CM | POA: Diagnosis not present

## 2017-01-22 DIAGNOSIS — N186 End stage renal disease: Secondary | ICD-10-CM | POA: Diagnosis not present

## 2017-01-22 DIAGNOSIS — Z79899 Other long term (current) drug therapy: Secondary | ICD-10-CM | POA: Diagnosis not present

## 2017-01-22 DIAGNOSIS — D509 Iron deficiency anemia, unspecified: Secondary | ICD-10-CM | POA: Diagnosis not present

## 2017-01-22 DIAGNOSIS — E440 Moderate protein-calorie malnutrition: Secondary | ICD-10-CM | POA: Diagnosis not present

## 2017-01-22 DIAGNOSIS — D631 Anemia in chronic kidney disease: Secondary | ICD-10-CM | POA: Diagnosis not present

## 2017-01-23 DIAGNOSIS — D509 Iron deficiency anemia, unspecified: Secondary | ICD-10-CM | POA: Diagnosis not present

## 2017-01-23 DIAGNOSIS — N186 End stage renal disease: Secondary | ICD-10-CM | POA: Diagnosis not present

## 2017-01-23 DIAGNOSIS — N2581 Secondary hyperparathyroidism of renal origin: Secondary | ICD-10-CM | POA: Diagnosis not present

## 2017-01-23 DIAGNOSIS — Z79899 Other long term (current) drug therapy: Secondary | ICD-10-CM | POA: Diagnosis not present

## 2017-01-23 DIAGNOSIS — E440 Moderate protein-calorie malnutrition: Secondary | ICD-10-CM | POA: Diagnosis not present

## 2017-01-23 DIAGNOSIS — D631 Anemia in chronic kidney disease: Secondary | ICD-10-CM | POA: Diagnosis not present

## 2017-01-24 DIAGNOSIS — N2581 Secondary hyperparathyroidism of renal origin: Secondary | ICD-10-CM | POA: Diagnosis not present

## 2017-01-24 DIAGNOSIS — D509 Iron deficiency anemia, unspecified: Secondary | ICD-10-CM | POA: Diagnosis not present

## 2017-01-24 DIAGNOSIS — E440 Moderate protein-calorie malnutrition: Secondary | ICD-10-CM | POA: Diagnosis not present

## 2017-01-24 DIAGNOSIS — N186 End stage renal disease: Secondary | ICD-10-CM | POA: Diagnosis not present

## 2017-01-24 DIAGNOSIS — D631 Anemia in chronic kidney disease: Secondary | ICD-10-CM | POA: Diagnosis not present

## 2017-01-24 DIAGNOSIS — Z79899 Other long term (current) drug therapy: Secondary | ICD-10-CM | POA: Diagnosis not present

## 2017-01-25 DIAGNOSIS — E440 Moderate protein-calorie malnutrition: Secondary | ICD-10-CM | POA: Diagnosis not present

## 2017-01-25 DIAGNOSIS — Z79899 Other long term (current) drug therapy: Secondary | ICD-10-CM | POA: Diagnosis not present

## 2017-01-25 DIAGNOSIS — D631 Anemia in chronic kidney disease: Secondary | ICD-10-CM | POA: Diagnosis not present

## 2017-01-25 DIAGNOSIS — N186 End stage renal disease: Secondary | ICD-10-CM | POA: Diagnosis not present

## 2017-01-25 DIAGNOSIS — N2581 Secondary hyperparathyroidism of renal origin: Secondary | ICD-10-CM | POA: Diagnosis not present

## 2017-01-25 DIAGNOSIS — D509 Iron deficiency anemia, unspecified: Secondary | ICD-10-CM | POA: Diagnosis not present

## 2017-01-26 DIAGNOSIS — N2581 Secondary hyperparathyroidism of renal origin: Secondary | ICD-10-CM | POA: Diagnosis not present

## 2017-01-26 DIAGNOSIS — D631 Anemia in chronic kidney disease: Secondary | ICD-10-CM | POA: Diagnosis not present

## 2017-01-26 DIAGNOSIS — N186 End stage renal disease: Secondary | ICD-10-CM | POA: Diagnosis not present

## 2017-01-26 DIAGNOSIS — Z79899 Other long term (current) drug therapy: Secondary | ICD-10-CM | POA: Diagnosis not present

## 2017-01-26 DIAGNOSIS — D509 Iron deficiency anemia, unspecified: Secondary | ICD-10-CM | POA: Diagnosis not present

## 2017-01-26 DIAGNOSIS — E440 Moderate protein-calorie malnutrition: Secondary | ICD-10-CM | POA: Diagnosis not present

## 2017-01-27 DIAGNOSIS — E440 Moderate protein-calorie malnutrition: Secondary | ICD-10-CM | POA: Diagnosis not present

## 2017-01-27 DIAGNOSIS — D631 Anemia in chronic kidney disease: Secondary | ICD-10-CM | POA: Diagnosis not present

## 2017-01-27 DIAGNOSIS — N186 End stage renal disease: Secondary | ICD-10-CM | POA: Diagnosis not present

## 2017-01-27 DIAGNOSIS — N2581 Secondary hyperparathyroidism of renal origin: Secondary | ICD-10-CM | POA: Diagnosis not present

## 2017-01-27 DIAGNOSIS — D509 Iron deficiency anemia, unspecified: Secondary | ICD-10-CM | POA: Diagnosis not present

## 2017-01-27 DIAGNOSIS — Z79899 Other long term (current) drug therapy: Secondary | ICD-10-CM | POA: Diagnosis not present

## 2017-01-28 DIAGNOSIS — D631 Anemia in chronic kidney disease: Secondary | ICD-10-CM | POA: Diagnosis not present

## 2017-01-28 DIAGNOSIS — E440 Moderate protein-calorie malnutrition: Secondary | ICD-10-CM | POA: Diagnosis not present

## 2017-01-28 DIAGNOSIS — Z79899 Other long term (current) drug therapy: Secondary | ICD-10-CM | POA: Diagnosis not present

## 2017-01-28 DIAGNOSIS — N2581 Secondary hyperparathyroidism of renal origin: Secondary | ICD-10-CM | POA: Diagnosis not present

## 2017-01-28 DIAGNOSIS — D509 Iron deficiency anemia, unspecified: Secondary | ICD-10-CM | POA: Diagnosis not present

## 2017-01-28 DIAGNOSIS — N186 End stage renal disease: Secondary | ICD-10-CM | POA: Diagnosis not present

## 2017-01-29 DIAGNOSIS — N2581 Secondary hyperparathyroidism of renal origin: Secondary | ICD-10-CM | POA: Diagnosis not present

## 2017-01-29 DIAGNOSIS — D509 Iron deficiency anemia, unspecified: Secondary | ICD-10-CM | POA: Diagnosis not present

## 2017-01-29 DIAGNOSIS — Z79899 Other long term (current) drug therapy: Secondary | ICD-10-CM | POA: Diagnosis not present

## 2017-01-29 DIAGNOSIS — N186 End stage renal disease: Secondary | ICD-10-CM | POA: Diagnosis not present

## 2017-01-29 DIAGNOSIS — E440 Moderate protein-calorie malnutrition: Secondary | ICD-10-CM | POA: Diagnosis not present

## 2017-01-29 DIAGNOSIS — D631 Anemia in chronic kidney disease: Secondary | ICD-10-CM | POA: Diagnosis not present

## 2017-01-30 DIAGNOSIS — D631 Anemia in chronic kidney disease: Secondary | ICD-10-CM | POA: Diagnosis not present

## 2017-01-30 DIAGNOSIS — D509 Iron deficiency anemia, unspecified: Secondary | ICD-10-CM | POA: Diagnosis not present

## 2017-01-30 DIAGNOSIS — N2581 Secondary hyperparathyroidism of renal origin: Secondary | ICD-10-CM | POA: Diagnosis not present

## 2017-01-30 DIAGNOSIS — N186 End stage renal disease: Secondary | ICD-10-CM | POA: Diagnosis not present

## 2017-01-30 DIAGNOSIS — E440 Moderate protein-calorie malnutrition: Secondary | ICD-10-CM | POA: Diagnosis not present

## 2017-01-30 DIAGNOSIS — Z79899 Other long term (current) drug therapy: Secondary | ICD-10-CM | POA: Diagnosis not present

## 2017-01-31 DIAGNOSIS — D509 Iron deficiency anemia, unspecified: Secondary | ICD-10-CM | POA: Diagnosis not present

## 2017-01-31 DIAGNOSIS — D631 Anemia in chronic kidney disease: Secondary | ICD-10-CM | POA: Diagnosis not present

## 2017-01-31 DIAGNOSIS — Z79899 Other long term (current) drug therapy: Secondary | ICD-10-CM | POA: Diagnosis not present

## 2017-01-31 DIAGNOSIS — N186 End stage renal disease: Secondary | ICD-10-CM | POA: Diagnosis not present

## 2017-01-31 DIAGNOSIS — N2581 Secondary hyperparathyroidism of renal origin: Secondary | ICD-10-CM | POA: Diagnosis not present

## 2017-01-31 DIAGNOSIS — E440 Moderate protein-calorie malnutrition: Secondary | ICD-10-CM | POA: Diagnosis not present

## 2017-02-01 DIAGNOSIS — D631 Anemia in chronic kidney disease: Secondary | ICD-10-CM | POA: Diagnosis not present

## 2017-02-01 DIAGNOSIS — D509 Iron deficiency anemia, unspecified: Secondary | ICD-10-CM | POA: Diagnosis not present

## 2017-02-01 DIAGNOSIS — N2581 Secondary hyperparathyroidism of renal origin: Secondary | ICD-10-CM | POA: Diagnosis not present

## 2017-02-01 DIAGNOSIS — N2889 Other specified disorders of kidney and ureter: Secondary | ICD-10-CM | POA: Diagnosis not present

## 2017-02-01 DIAGNOSIS — Z79899 Other long term (current) drug therapy: Secondary | ICD-10-CM | POA: Diagnosis not present

## 2017-02-01 DIAGNOSIS — N186 End stage renal disease: Secondary | ICD-10-CM | POA: Diagnosis not present

## 2017-02-01 DIAGNOSIS — Z992 Dependence on renal dialysis: Secondary | ICD-10-CM | POA: Diagnosis not present

## 2017-02-01 DIAGNOSIS — E440 Moderate protein-calorie malnutrition: Secondary | ICD-10-CM | POA: Diagnosis not present

## 2017-02-02 DIAGNOSIS — D509 Iron deficiency anemia, unspecified: Secondary | ICD-10-CM | POA: Diagnosis not present

## 2017-02-02 DIAGNOSIS — N2581 Secondary hyperparathyroidism of renal origin: Secondary | ICD-10-CM | POA: Diagnosis not present

## 2017-02-02 DIAGNOSIS — D631 Anemia in chronic kidney disease: Secondary | ICD-10-CM | POA: Diagnosis not present

## 2017-02-02 DIAGNOSIS — Z79899 Other long term (current) drug therapy: Secondary | ICD-10-CM | POA: Diagnosis not present

## 2017-02-02 DIAGNOSIS — R8299 Other abnormal findings in urine: Secondary | ICD-10-CM | POA: Diagnosis not present

## 2017-02-02 DIAGNOSIS — N186 End stage renal disease: Secondary | ICD-10-CM | POA: Diagnosis not present

## 2017-02-02 DIAGNOSIS — E440 Moderate protein-calorie malnutrition: Secondary | ICD-10-CM | POA: Diagnosis not present

## 2017-02-03 DIAGNOSIS — D631 Anemia in chronic kidney disease: Secondary | ICD-10-CM | POA: Diagnosis not present

## 2017-02-03 DIAGNOSIS — N2581 Secondary hyperparathyroidism of renal origin: Secondary | ICD-10-CM | POA: Diagnosis not present

## 2017-02-03 DIAGNOSIS — N186 End stage renal disease: Secondary | ICD-10-CM | POA: Diagnosis not present

## 2017-02-03 DIAGNOSIS — D509 Iron deficiency anemia, unspecified: Secondary | ICD-10-CM | POA: Diagnosis not present

## 2017-02-03 DIAGNOSIS — E440 Moderate protein-calorie malnutrition: Secondary | ICD-10-CM | POA: Diagnosis not present

## 2017-02-03 DIAGNOSIS — Z79899 Other long term (current) drug therapy: Secondary | ICD-10-CM | POA: Diagnosis not present

## 2017-02-04 DIAGNOSIS — E440 Moderate protein-calorie malnutrition: Secondary | ICD-10-CM | POA: Diagnosis not present

## 2017-02-04 DIAGNOSIS — D631 Anemia in chronic kidney disease: Secondary | ICD-10-CM | POA: Diagnosis not present

## 2017-02-04 DIAGNOSIS — N186 End stage renal disease: Secondary | ICD-10-CM | POA: Diagnosis not present

## 2017-02-04 DIAGNOSIS — D509 Iron deficiency anemia, unspecified: Secondary | ICD-10-CM | POA: Diagnosis not present

## 2017-02-04 DIAGNOSIS — N2581 Secondary hyperparathyroidism of renal origin: Secondary | ICD-10-CM | POA: Diagnosis not present

## 2017-02-04 DIAGNOSIS — Z79899 Other long term (current) drug therapy: Secondary | ICD-10-CM | POA: Diagnosis not present

## 2017-02-05 DIAGNOSIS — N186 End stage renal disease: Secondary | ICD-10-CM | POA: Diagnosis not present

## 2017-02-05 DIAGNOSIS — D631 Anemia in chronic kidney disease: Secondary | ICD-10-CM | POA: Diagnosis not present

## 2017-02-05 DIAGNOSIS — E440 Moderate protein-calorie malnutrition: Secondary | ICD-10-CM | POA: Diagnosis not present

## 2017-02-05 DIAGNOSIS — N2581 Secondary hyperparathyroidism of renal origin: Secondary | ICD-10-CM | POA: Diagnosis not present

## 2017-02-05 DIAGNOSIS — D509 Iron deficiency anemia, unspecified: Secondary | ICD-10-CM | POA: Diagnosis not present

## 2017-02-05 DIAGNOSIS — Z79899 Other long term (current) drug therapy: Secondary | ICD-10-CM | POA: Diagnosis not present

## 2017-02-06 DIAGNOSIS — Z79899 Other long term (current) drug therapy: Secondary | ICD-10-CM | POA: Diagnosis not present

## 2017-02-06 DIAGNOSIS — D631 Anemia in chronic kidney disease: Secondary | ICD-10-CM | POA: Diagnosis not present

## 2017-02-06 DIAGNOSIS — N186 End stage renal disease: Secondary | ICD-10-CM | POA: Diagnosis not present

## 2017-02-06 DIAGNOSIS — D509 Iron deficiency anemia, unspecified: Secondary | ICD-10-CM | POA: Diagnosis not present

## 2017-02-06 DIAGNOSIS — N2581 Secondary hyperparathyroidism of renal origin: Secondary | ICD-10-CM | POA: Diagnosis not present

## 2017-02-06 DIAGNOSIS — E440 Moderate protein-calorie malnutrition: Secondary | ICD-10-CM | POA: Diagnosis not present

## 2017-02-07 DIAGNOSIS — D631 Anemia in chronic kidney disease: Secondary | ICD-10-CM | POA: Diagnosis not present

## 2017-02-07 DIAGNOSIS — D509 Iron deficiency anemia, unspecified: Secondary | ICD-10-CM | POA: Diagnosis not present

## 2017-02-07 DIAGNOSIS — N2581 Secondary hyperparathyroidism of renal origin: Secondary | ICD-10-CM | POA: Diagnosis not present

## 2017-02-07 DIAGNOSIS — N186 End stage renal disease: Secondary | ICD-10-CM | POA: Diagnosis not present

## 2017-02-07 DIAGNOSIS — E440 Moderate protein-calorie malnutrition: Secondary | ICD-10-CM | POA: Diagnosis not present

## 2017-02-07 DIAGNOSIS — Z79899 Other long term (current) drug therapy: Secondary | ICD-10-CM | POA: Diagnosis not present

## 2017-02-08 DIAGNOSIS — N186 End stage renal disease: Secondary | ICD-10-CM | POA: Diagnosis not present

## 2017-02-08 DIAGNOSIS — N2581 Secondary hyperparathyroidism of renal origin: Secondary | ICD-10-CM | POA: Diagnosis not present

## 2017-02-08 DIAGNOSIS — D509 Iron deficiency anemia, unspecified: Secondary | ICD-10-CM | POA: Diagnosis not present

## 2017-02-08 DIAGNOSIS — D631 Anemia in chronic kidney disease: Secondary | ICD-10-CM | POA: Diagnosis not present

## 2017-02-08 DIAGNOSIS — Z79899 Other long term (current) drug therapy: Secondary | ICD-10-CM | POA: Diagnosis not present

## 2017-02-08 DIAGNOSIS — E440 Moderate protein-calorie malnutrition: Secondary | ICD-10-CM | POA: Diagnosis not present

## 2017-02-09 DIAGNOSIS — D509 Iron deficiency anemia, unspecified: Secondary | ICD-10-CM | POA: Diagnosis not present

## 2017-02-09 DIAGNOSIS — Z79899 Other long term (current) drug therapy: Secondary | ICD-10-CM | POA: Diagnosis not present

## 2017-02-09 DIAGNOSIS — N186 End stage renal disease: Secondary | ICD-10-CM | POA: Diagnosis not present

## 2017-02-09 DIAGNOSIS — N2581 Secondary hyperparathyroidism of renal origin: Secondary | ICD-10-CM | POA: Diagnosis not present

## 2017-02-09 DIAGNOSIS — D631 Anemia in chronic kidney disease: Secondary | ICD-10-CM | POA: Diagnosis not present

## 2017-02-09 DIAGNOSIS — E440 Moderate protein-calorie malnutrition: Secondary | ICD-10-CM | POA: Diagnosis not present

## 2017-02-10 DIAGNOSIS — D631 Anemia in chronic kidney disease: Secondary | ICD-10-CM | POA: Diagnosis not present

## 2017-02-10 DIAGNOSIS — Z79899 Other long term (current) drug therapy: Secondary | ICD-10-CM | POA: Diagnosis not present

## 2017-02-10 DIAGNOSIS — N186 End stage renal disease: Secondary | ICD-10-CM | POA: Diagnosis not present

## 2017-02-10 DIAGNOSIS — E440 Moderate protein-calorie malnutrition: Secondary | ICD-10-CM | POA: Diagnosis not present

## 2017-02-10 DIAGNOSIS — N2581 Secondary hyperparathyroidism of renal origin: Secondary | ICD-10-CM | POA: Diagnosis not present

## 2017-02-10 DIAGNOSIS — D509 Iron deficiency anemia, unspecified: Secondary | ICD-10-CM | POA: Diagnosis not present

## 2017-02-11 DIAGNOSIS — E440 Moderate protein-calorie malnutrition: Secondary | ICD-10-CM | POA: Diagnosis not present

## 2017-02-11 DIAGNOSIS — D631 Anemia in chronic kidney disease: Secondary | ICD-10-CM | POA: Diagnosis not present

## 2017-02-11 DIAGNOSIS — D509 Iron deficiency anemia, unspecified: Secondary | ICD-10-CM | POA: Diagnosis not present

## 2017-02-11 DIAGNOSIS — N186 End stage renal disease: Secondary | ICD-10-CM | POA: Diagnosis not present

## 2017-02-11 DIAGNOSIS — N2581 Secondary hyperparathyroidism of renal origin: Secondary | ICD-10-CM | POA: Diagnosis not present

## 2017-02-11 DIAGNOSIS — Z79899 Other long term (current) drug therapy: Secondary | ICD-10-CM | POA: Diagnosis not present

## 2017-02-12 DIAGNOSIS — D509 Iron deficiency anemia, unspecified: Secondary | ICD-10-CM | POA: Diagnosis not present

## 2017-02-12 DIAGNOSIS — N186 End stage renal disease: Secondary | ICD-10-CM | POA: Diagnosis not present

## 2017-02-12 DIAGNOSIS — E440 Moderate protein-calorie malnutrition: Secondary | ICD-10-CM | POA: Diagnosis not present

## 2017-02-12 DIAGNOSIS — D631 Anemia in chronic kidney disease: Secondary | ICD-10-CM | POA: Diagnosis not present

## 2017-02-12 DIAGNOSIS — Z79899 Other long term (current) drug therapy: Secondary | ICD-10-CM | POA: Diagnosis not present

## 2017-02-12 DIAGNOSIS — N2581 Secondary hyperparathyroidism of renal origin: Secondary | ICD-10-CM | POA: Diagnosis not present

## 2017-02-13 DIAGNOSIS — E440 Moderate protein-calorie malnutrition: Secondary | ICD-10-CM | POA: Diagnosis not present

## 2017-02-13 DIAGNOSIS — Z79899 Other long term (current) drug therapy: Secondary | ICD-10-CM | POA: Diagnosis not present

## 2017-02-13 DIAGNOSIS — N2581 Secondary hyperparathyroidism of renal origin: Secondary | ICD-10-CM | POA: Diagnosis not present

## 2017-02-13 DIAGNOSIS — D631 Anemia in chronic kidney disease: Secondary | ICD-10-CM | POA: Diagnosis not present

## 2017-02-13 DIAGNOSIS — N186 End stage renal disease: Secondary | ICD-10-CM | POA: Diagnosis not present

## 2017-02-13 DIAGNOSIS — D509 Iron deficiency anemia, unspecified: Secondary | ICD-10-CM | POA: Diagnosis not present

## 2017-02-14 DIAGNOSIS — Z79899 Other long term (current) drug therapy: Secondary | ICD-10-CM | POA: Diagnosis not present

## 2017-02-14 DIAGNOSIS — N186 End stage renal disease: Secondary | ICD-10-CM | POA: Diagnosis not present

## 2017-02-14 DIAGNOSIS — D631 Anemia in chronic kidney disease: Secondary | ICD-10-CM | POA: Diagnosis not present

## 2017-02-14 DIAGNOSIS — E440 Moderate protein-calorie malnutrition: Secondary | ICD-10-CM | POA: Diagnosis not present

## 2017-02-14 DIAGNOSIS — N2581 Secondary hyperparathyroidism of renal origin: Secondary | ICD-10-CM | POA: Diagnosis not present

## 2017-02-14 DIAGNOSIS — D509 Iron deficiency anemia, unspecified: Secondary | ICD-10-CM | POA: Diagnosis not present

## 2017-02-15 DIAGNOSIS — D509 Iron deficiency anemia, unspecified: Secondary | ICD-10-CM | POA: Diagnosis not present

## 2017-02-15 DIAGNOSIS — D631 Anemia in chronic kidney disease: Secondary | ICD-10-CM | POA: Diagnosis not present

## 2017-02-15 DIAGNOSIS — Z79899 Other long term (current) drug therapy: Secondary | ICD-10-CM | POA: Diagnosis not present

## 2017-02-15 DIAGNOSIS — N186 End stage renal disease: Secondary | ICD-10-CM | POA: Diagnosis not present

## 2017-02-15 DIAGNOSIS — N2581 Secondary hyperparathyroidism of renal origin: Secondary | ICD-10-CM | POA: Diagnosis not present

## 2017-02-15 DIAGNOSIS — E440 Moderate protein-calorie malnutrition: Secondary | ICD-10-CM | POA: Diagnosis not present

## 2017-02-16 DIAGNOSIS — Z79899 Other long term (current) drug therapy: Secondary | ICD-10-CM | POA: Diagnosis not present

## 2017-02-16 DIAGNOSIS — D509 Iron deficiency anemia, unspecified: Secondary | ICD-10-CM | POA: Diagnosis not present

## 2017-02-16 DIAGNOSIS — N2581 Secondary hyperparathyroidism of renal origin: Secondary | ICD-10-CM | POA: Diagnosis not present

## 2017-02-16 DIAGNOSIS — E440 Moderate protein-calorie malnutrition: Secondary | ICD-10-CM | POA: Diagnosis not present

## 2017-02-16 DIAGNOSIS — D631 Anemia in chronic kidney disease: Secondary | ICD-10-CM | POA: Diagnosis not present

## 2017-02-16 DIAGNOSIS — N186 End stage renal disease: Secondary | ICD-10-CM | POA: Diagnosis not present

## 2017-02-17 DIAGNOSIS — Z79899 Other long term (current) drug therapy: Secondary | ICD-10-CM | POA: Diagnosis not present

## 2017-02-17 DIAGNOSIS — N2581 Secondary hyperparathyroidism of renal origin: Secondary | ICD-10-CM | POA: Diagnosis not present

## 2017-02-17 DIAGNOSIS — D509 Iron deficiency anemia, unspecified: Secondary | ICD-10-CM | POA: Diagnosis not present

## 2017-02-17 DIAGNOSIS — D631 Anemia in chronic kidney disease: Secondary | ICD-10-CM | POA: Diagnosis not present

## 2017-02-17 DIAGNOSIS — E440 Moderate protein-calorie malnutrition: Secondary | ICD-10-CM | POA: Diagnosis not present

## 2017-02-17 DIAGNOSIS — N186 End stage renal disease: Secondary | ICD-10-CM | POA: Diagnosis not present

## 2017-02-18 DIAGNOSIS — Z79899 Other long term (current) drug therapy: Secondary | ICD-10-CM | POA: Diagnosis not present

## 2017-02-18 DIAGNOSIS — N2581 Secondary hyperparathyroidism of renal origin: Secondary | ICD-10-CM | POA: Diagnosis not present

## 2017-02-18 DIAGNOSIS — D631 Anemia in chronic kidney disease: Secondary | ICD-10-CM | POA: Diagnosis not present

## 2017-02-18 DIAGNOSIS — D509 Iron deficiency anemia, unspecified: Secondary | ICD-10-CM | POA: Diagnosis not present

## 2017-02-18 DIAGNOSIS — N186 End stage renal disease: Secondary | ICD-10-CM | POA: Diagnosis not present

## 2017-02-18 DIAGNOSIS — E440 Moderate protein-calorie malnutrition: Secondary | ICD-10-CM | POA: Diagnosis not present

## 2017-02-19 DIAGNOSIS — D631 Anemia in chronic kidney disease: Secondary | ICD-10-CM | POA: Diagnosis not present

## 2017-02-19 DIAGNOSIS — N186 End stage renal disease: Secondary | ICD-10-CM | POA: Diagnosis not present

## 2017-02-19 DIAGNOSIS — D509 Iron deficiency anemia, unspecified: Secondary | ICD-10-CM | POA: Diagnosis not present

## 2017-02-19 DIAGNOSIS — E440 Moderate protein-calorie malnutrition: Secondary | ICD-10-CM | POA: Diagnosis not present

## 2017-02-19 DIAGNOSIS — Z79899 Other long term (current) drug therapy: Secondary | ICD-10-CM | POA: Diagnosis not present

## 2017-02-19 DIAGNOSIS — N2581 Secondary hyperparathyroidism of renal origin: Secondary | ICD-10-CM | POA: Diagnosis not present

## 2017-02-20 DIAGNOSIS — E440 Moderate protein-calorie malnutrition: Secondary | ICD-10-CM | POA: Diagnosis not present

## 2017-02-20 DIAGNOSIS — D631 Anemia in chronic kidney disease: Secondary | ICD-10-CM | POA: Diagnosis not present

## 2017-02-20 DIAGNOSIS — N2581 Secondary hyperparathyroidism of renal origin: Secondary | ICD-10-CM | POA: Diagnosis not present

## 2017-02-20 DIAGNOSIS — D509 Iron deficiency anemia, unspecified: Secondary | ICD-10-CM | POA: Diagnosis not present

## 2017-02-20 DIAGNOSIS — Z79899 Other long term (current) drug therapy: Secondary | ICD-10-CM | POA: Diagnosis not present

## 2017-02-20 DIAGNOSIS — N186 End stage renal disease: Secondary | ICD-10-CM | POA: Diagnosis not present

## 2017-02-21 DIAGNOSIS — Z79899 Other long term (current) drug therapy: Secondary | ICD-10-CM | POA: Diagnosis not present

## 2017-02-21 DIAGNOSIS — D509 Iron deficiency anemia, unspecified: Secondary | ICD-10-CM | POA: Diagnosis not present

## 2017-02-21 DIAGNOSIS — N186 End stage renal disease: Secondary | ICD-10-CM | POA: Diagnosis not present

## 2017-02-21 DIAGNOSIS — D631 Anemia in chronic kidney disease: Secondary | ICD-10-CM | POA: Diagnosis not present

## 2017-02-21 DIAGNOSIS — N2581 Secondary hyperparathyroidism of renal origin: Secondary | ICD-10-CM | POA: Diagnosis not present

## 2017-02-21 DIAGNOSIS — E440 Moderate protein-calorie malnutrition: Secondary | ICD-10-CM | POA: Diagnosis not present

## 2017-02-22 DIAGNOSIS — N2581 Secondary hyperparathyroidism of renal origin: Secondary | ICD-10-CM | POA: Diagnosis not present

## 2017-02-22 DIAGNOSIS — Z79899 Other long term (current) drug therapy: Secondary | ICD-10-CM | POA: Diagnosis not present

## 2017-02-22 DIAGNOSIS — E440 Moderate protein-calorie malnutrition: Secondary | ICD-10-CM | POA: Diagnosis not present

## 2017-02-22 DIAGNOSIS — D631 Anemia in chronic kidney disease: Secondary | ICD-10-CM | POA: Diagnosis not present

## 2017-02-22 DIAGNOSIS — D509 Iron deficiency anemia, unspecified: Secondary | ICD-10-CM | POA: Diagnosis not present

## 2017-02-22 DIAGNOSIS — N186 End stage renal disease: Secondary | ICD-10-CM | POA: Diagnosis not present

## 2017-02-23 DIAGNOSIS — D509 Iron deficiency anemia, unspecified: Secondary | ICD-10-CM | POA: Diagnosis not present

## 2017-02-23 DIAGNOSIS — D631 Anemia in chronic kidney disease: Secondary | ICD-10-CM | POA: Diagnosis not present

## 2017-02-23 DIAGNOSIS — Z79899 Other long term (current) drug therapy: Secondary | ICD-10-CM | POA: Diagnosis not present

## 2017-02-23 DIAGNOSIS — N186 End stage renal disease: Secondary | ICD-10-CM | POA: Diagnosis not present

## 2017-02-23 DIAGNOSIS — E440 Moderate protein-calorie malnutrition: Secondary | ICD-10-CM | POA: Diagnosis not present

## 2017-02-23 DIAGNOSIS — N2581 Secondary hyperparathyroidism of renal origin: Secondary | ICD-10-CM | POA: Diagnosis not present

## 2017-02-24 DIAGNOSIS — N2581 Secondary hyperparathyroidism of renal origin: Secondary | ICD-10-CM | POA: Diagnosis not present

## 2017-02-24 DIAGNOSIS — N186 End stage renal disease: Secondary | ICD-10-CM | POA: Diagnosis not present

## 2017-02-24 DIAGNOSIS — D509 Iron deficiency anemia, unspecified: Secondary | ICD-10-CM | POA: Diagnosis not present

## 2017-02-24 DIAGNOSIS — D631 Anemia in chronic kidney disease: Secondary | ICD-10-CM | POA: Diagnosis not present

## 2017-02-24 DIAGNOSIS — E440 Moderate protein-calorie malnutrition: Secondary | ICD-10-CM | POA: Diagnosis not present

## 2017-02-24 DIAGNOSIS — Z79899 Other long term (current) drug therapy: Secondary | ICD-10-CM | POA: Diagnosis not present

## 2017-02-25 DIAGNOSIS — N186 End stage renal disease: Secondary | ICD-10-CM | POA: Diagnosis not present

## 2017-02-25 DIAGNOSIS — Z79899 Other long term (current) drug therapy: Secondary | ICD-10-CM | POA: Diagnosis not present

## 2017-02-25 DIAGNOSIS — E440 Moderate protein-calorie malnutrition: Secondary | ICD-10-CM | POA: Diagnosis not present

## 2017-02-25 DIAGNOSIS — D631 Anemia in chronic kidney disease: Secondary | ICD-10-CM | POA: Diagnosis not present

## 2017-02-25 DIAGNOSIS — N2581 Secondary hyperparathyroidism of renal origin: Secondary | ICD-10-CM | POA: Diagnosis not present

## 2017-02-25 DIAGNOSIS — D509 Iron deficiency anemia, unspecified: Secondary | ICD-10-CM | POA: Diagnosis not present

## 2017-02-26 DIAGNOSIS — E440 Moderate protein-calorie malnutrition: Secondary | ICD-10-CM | POA: Diagnosis not present

## 2017-02-26 DIAGNOSIS — Z79899 Other long term (current) drug therapy: Secondary | ICD-10-CM | POA: Diagnosis not present

## 2017-02-26 DIAGNOSIS — D509 Iron deficiency anemia, unspecified: Secondary | ICD-10-CM | POA: Diagnosis not present

## 2017-02-26 DIAGNOSIS — N186 End stage renal disease: Secondary | ICD-10-CM | POA: Diagnosis not present

## 2017-02-26 DIAGNOSIS — D631 Anemia in chronic kidney disease: Secondary | ICD-10-CM | POA: Diagnosis not present

## 2017-02-26 DIAGNOSIS — N2581 Secondary hyperparathyroidism of renal origin: Secondary | ICD-10-CM | POA: Diagnosis not present

## 2017-02-27 DIAGNOSIS — N2581 Secondary hyperparathyroidism of renal origin: Secondary | ICD-10-CM | POA: Diagnosis not present

## 2017-02-27 DIAGNOSIS — N186 End stage renal disease: Secondary | ICD-10-CM | POA: Diagnosis not present

## 2017-02-27 DIAGNOSIS — D509 Iron deficiency anemia, unspecified: Secondary | ICD-10-CM | POA: Diagnosis not present

## 2017-02-27 DIAGNOSIS — D631 Anemia in chronic kidney disease: Secondary | ICD-10-CM | POA: Diagnosis not present

## 2017-02-27 DIAGNOSIS — E440 Moderate protein-calorie malnutrition: Secondary | ICD-10-CM | POA: Diagnosis not present

## 2017-02-27 DIAGNOSIS — Z79899 Other long term (current) drug therapy: Secondary | ICD-10-CM | POA: Diagnosis not present

## 2017-02-28 DIAGNOSIS — Z79899 Other long term (current) drug therapy: Secondary | ICD-10-CM | POA: Diagnosis not present

## 2017-02-28 DIAGNOSIS — N2581 Secondary hyperparathyroidism of renal origin: Secondary | ICD-10-CM | POA: Diagnosis not present

## 2017-02-28 DIAGNOSIS — E440 Moderate protein-calorie malnutrition: Secondary | ICD-10-CM | POA: Diagnosis not present

## 2017-02-28 DIAGNOSIS — D509 Iron deficiency anemia, unspecified: Secondary | ICD-10-CM | POA: Diagnosis not present

## 2017-02-28 DIAGNOSIS — N186 End stage renal disease: Secondary | ICD-10-CM | POA: Diagnosis not present

## 2017-02-28 DIAGNOSIS — D631 Anemia in chronic kidney disease: Secondary | ICD-10-CM | POA: Diagnosis not present

## 2017-03-01 DIAGNOSIS — N2581 Secondary hyperparathyroidism of renal origin: Secondary | ICD-10-CM | POA: Diagnosis not present

## 2017-03-01 DIAGNOSIS — E440 Moderate protein-calorie malnutrition: Secondary | ICD-10-CM | POA: Diagnosis not present

## 2017-03-01 DIAGNOSIS — Z79899 Other long term (current) drug therapy: Secondary | ICD-10-CM | POA: Diagnosis not present

## 2017-03-01 DIAGNOSIS — D509 Iron deficiency anemia, unspecified: Secondary | ICD-10-CM | POA: Diagnosis not present

## 2017-03-01 DIAGNOSIS — N186 End stage renal disease: Secondary | ICD-10-CM | POA: Diagnosis not present

## 2017-03-01 DIAGNOSIS — D631 Anemia in chronic kidney disease: Secondary | ICD-10-CM | POA: Diagnosis not present

## 2017-03-02 DIAGNOSIS — D509 Iron deficiency anemia, unspecified: Secondary | ICD-10-CM | POA: Diagnosis not present

## 2017-03-02 DIAGNOSIS — N2581 Secondary hyperparathyroidism of renal origin: Secondary | ICD-10-CM | POA: Diagnosis not present

## 2017-03-02 DIAGNOSIS — Z79899 Other long term (current) drug therapy: Secondary | ICD-10-CM | POA: Diagnosis not present

## 2017-03-02 DIAGNOSIS — N186 End stage renal disease: Secondary | ICD-10-CM | POA: Diagnosis not present

## 2017-03-02 DIAGNOSIS — D631 Anemia in chronic kidney disease: Secondary | ICD-10-CM | POA: Diagnosis not present

## 2017-03-02 DIAGNOSIS — E440 Moderate protein-calorie malnutrition: Secondary | ICD-10-CM | POA: Diagnosis not present

## 2017-03-03 DIAGNOSIS — Z79899 Other long term (current) drug therapy: Secondary | ICD-10-CM | POA: Diagnosis not present

## 2017-03-03 DIAGNOSIS — N2889 Other specified disorders of kidney and ureter: Secondary | ICD-10-CM | POA: Diagnosis not present

## 2017-03-03 DIAGNOSIS — D509 Iron deficiency anemia, unspecified: Secondary | ICD-10-CM | POA: Diagnosis not present

## 2017-03-03 DIAGNOSIS — E440 Moderate protein-calorie malnutrition: Secondary | ICD-10-CM | POA: Diagnosis not present

## 2017-03-03 DIAGNOSIS — Z992 Dependence on renal dialysis: Secondary | ICD-10-CM | POA: Diagnosis not present

## 2017-03-03 DIAGNOSIS — D631 Anemia in chronic kidney disease: Secondary | ICD-10-CM | POA: Diagnosis not present

## 2017-03-03 DIAGNOSIS — N2581 Secondary hyperparathyroidism of renal origin: Secondary | ICD-10-CM | POA: Diagnosis not present

## 2017-03-03 DIAGNOSIS — N186 End stage renal disease: Secondary | ICD-10-CM | POA: Diagnosis not present

## 2017-03-04 DIAGNOSIS — N2581 Secondary hyperparathyroidism of renal origin: Secondary | ICD-10-CM | POA: Diagnosis not present

## 2017-03-04 DIAGNOSIS — D631 Anemia in chronic kidney disease: Secondary | ICD-10-CM | POA: Diagnosis not present

## 2017-03-04 DIAGNOSIS — N186 End stage renal disease: Secondary | ICD-10-CM | POA: Diagnosis not present

## 2017-03-04 DIAGNOSIS — D509 Iron deficiency anemia, unspecified: Secondary | ICD-10-CM | POA: Diagnosis not present

## 2017-03-04 DIAGNOSIS — Z4932 Encounter for adequacy testing for peritoneal dialysis: Secondary | ICD-10-CM | POA: Diagnosis not present

## 2017-03-04 DIAGNOSIS — N2589 Other disorders resulting from impaired renal tubular function: Secondary | ICD-10-CM | POA: Diagnosis not present

## 2017-03-05 DIAGNOSIS — D509 Iron deficiency anemia, unspecified: Secondary | ICD-10-CM | POA: Diagnosis not present

## 2017-03-05 DIAGNOSIS — N2589 Other disorders resulting from impaired renal tubular function: Secondary | ICD-10-CM | POA: Diagnosis not present

## 2017-03-05 DIAGNOSIS — D631 Anemia in chronic kidney disease: Secondary | ICD-10-CM | POA: Diagnosis not present

## 2017-03-05 DIAGNOSIS — Z4932 Encounter for adequacy testing for peritoneal dialysis: Secondary | ICD-10-CM | POA: Diagnosis not present

## 2017-03-05 DIAGNOSIS — N186 End stage renal disease: Secondary | ICD-10-CM | POA: Diagnosis not present

## 2017-03-05 DIAGNOSIS — N2581 Secondary hyperparathyroidism of renal origin: Secondary | ICD-10-CM | POA: Diagnosis not present

## 2017-03-06 DIAGNOSIS — N2589 Other disorders resulting from impaired renal tubular function: Secondary | ICD-10-CM | POA: Diagnosis not present

## 2017-03-06 DIAGNOSIS — D631 Anemia in chronic kidney disease: Secondary | ICD-10-CM | POA: Diagnosis not present

## 2017-03-06 DIAGNOSIS — N186 End stage renal disease: Secondary | ICD-10-CM | POA: Diagnosis not present

## 2017-03-06 DIAGNOSIS — D509 Iron deficiency anemia, unspecified: Secondary | ICD-10-CM | POA: Diagnosis not present

## 2017-03-06 DIAGNOSIS — E784 Other hyperlipidemia: Secondary | ICD-10-CM | POA: Diagnosis not present

## 2017-03-06 DIAGNOSIS — N2581 Secondary hyperparathyroidism of renal origin: Secondary | ICD-10-CM | POA: Diagnosis not present

## 2017-03-06 DIAGNOSIS — R8299 Other abnormal findings in urine: Secondary | ICD-10-CM | POA: Diagnosis not present

## 2017-03-06 DIAGNOSIS — Z4932 Encounter for adequacy testing for peritoneal dialysis: Secondary | ICD-10-CM | POA: Diagnosis not present

## 2017-03-07 DIAGNOSIS — D631 Anemia in chronic kidney disease: Secondary | ICD-10-CM | POA: Diagnosis not present

## 2017-03-07 DIAGNOSIS — N2589 Other disorders resulting from impaired renal tubular function: Secondary | ICD-10-CM | POA: Diagnosis not present

## 2017-03-07 DIAGNOSIS — D509 Iron deficiency anemia, unspecified: Secondary | ICD-10-CM | POA: Diagnosis not present

## 2017-03-07 DIAGNOSIS — Z4932 Encounter for adequacy testing for peritoneal dialysis: Secondary | ICD-10-CM | POA: Diagnosis not present

## 2017-03-07 DIAGNOSIS — N186 End stage renal disease: Secondary | ICD-10-CM | POA: Diagnosis not present

## 2017-03-07 DIAGNOSIS — N2581 Secondary hyperparathyroidism of renal origin: Secondary | ICD-10-CM | POA: Diagnosis not present

## 2017-03-08 DIAGNOSIS — N2589 Other disorders resulting from impaired renal tubular function: Secondary | ICD-10-CM | POA: Diagnosis not present

## 2017-03-08 DIAGNOSIS — D509 Iron deficiency anemia, unspecified: Secondary | ICD-10-CM | POA: Diagnosis not present

## 2017-03-08 DIAGNOSIS — N186 End stage renal disease: Secondary | ICD-10-CM | POA: Diagnosis not present

## 2017-03-08 DIAGNOSIS — N2581 Secondary hyperparathyroidism of renal origin: Secondary | ICD-10-CM | POA: Diagnosis not present

## 2017-03-08 DIAGNOSIS — Z4932 Encounter for adequacy testing for peritoneal dialysis: Secondary | ICD-10-CM | POA: Diagnosis not present

## 2017-03-08 DIAGNOSIS — D631 Anemia in chronic kidney disease: Secondary | ICD-10-CM | POA: Diagnosis not present

## 2017-03-09 DIAGNOSIS — D631 Anemia in chronic kidney disease: Secondary | ICD-10-CM | POA: Diagnosis not present

## 2017-03-09 DIAGNOSIS — D509 Iron deficiency anemia, unspecified: Secondary | ICD-10-CM | POA: Diagnosis not present

## 2017-03-09 DIAGNOSIS — N186 End stage renal disease: Secondary | ICD-10-CM | POA: Diagnosis not present

## 2017-03-09 DIAGNOSIS — N2589 Other disorders resulting from impaired renal tubular function: Secondary | ICD-10-CM | POA: Diagnosis not present

## 2017-03-09 DIAGNOSIS — Z4932 Encounter for adequacy testing for peritoneal dialysis: Secondary | ICD-10-CM | POA: Diagnosis not present

## 2017-03-09 DIAGNOSIS — N2581 Secondary hyperparathyroidism of renal origin: Secondary | ICD-10-CM | POA: Diagnosis not present

## 2017-03-10 DIAGNOSIS — D509 Iron deficiency anemia, unspecified: Secondary | ICD-10-CM | POA: Diagnosis not present

## 2017-03-10 DIAGNOSIS — D631 Anemia in chronic kidney disease: Secondary | ICD-10-CM | POA: Diagnosis not present

## 2017-03-10 DIAGNOSIS — Z4932 Encounter for adequacy testing for peritoneal dialysis: Secondary | ICD-10-CM | POA: Diagnosis not present

## 2017-03-10 DIAGNOSIS — N186 End stage renal disease: Secondary | ICD-10-CM | POA: Diagnosis not present

## 2017-03-10 DIAGNOSIS — N2589 Other disorders resulting from impaired renal tubular function: Secondary | ICD-10-CM | POA: Diagnosis not present

## 2017-03-10 DIAGNOSIS — N2581 Secondary hyperparathyroidism of renal origin: Secondary | ICD-10-CM | POA: Diagnosis not present

## 2017-03-11 DIAGNOSIS — D631 Anemia in chronic kidney disease: Secondary | ICD-10-CM | POA: Diagnosis not present

## 2017-03-11 DIAGNOSIS — N2581 Secondary hyperparathyroidism of renal origin: Secondary | ICD-10-CM | POA: Diagnosis not present

## 2017-03-11 DIAGNOSIS — D509 Iron deficiency anemia, unspecified: Secondary | ICD-10-CM | POA: Diagnosis not present

## 2017-03-11 DIAGNOSIS — N2589 Other disorders resulting from impaired renal tubular function: Secondary | ICD-10-CM | POA: Diagnosis not present

## 2017-03-11 DIAGNOSIS — N186 End stage renal disease: Secondary | ICD-10-CM | POA: Diagnosis not present

## 2017-03-11 DIAGNOSIS — Z4932 Encounter for adequacy testing for peritoneal dialysis: Secondary | ICD-10-CM | POA: Diagnosis not present

## 2017-03-12 DIAGNOSIS — N2581 Secondary hyperparathyroidism of renal origin: Secondary | ICD-10-CM | POA: Diagnosis not present

## 2017-03-12 DIAGNOSIS — D509 Iron deficiency anemia, unspecified: Secondary | ICD-10-CM | POA: Diagnosis not present

## 2017-03-12 DIAGNOSIS — N2589 Other disorders resulting from impaired renal tubular function: Secondary | ICD-10-CM | POA: Diagnosis not present

## 2017-03-12 DIAGNOSIS — N186 End stage renal disease: Secondary | ICD-10-CM | POA: Diagnosis not present

## 2017-03-12 DIAGNOSIS — D631 Anemia in chronic kidney disease: Secondary | ICD-10-CM | POA: Diagnosis not present

## 2017-03-12 DIAGNOSIS — Z4932 Encounter for adequacy testing for peritoneal dialysis: Secondary | ICD-10-CM | POA: Diagnosis not present

## 2017-03-13 DIAGNOSIS — N186 End stage renal disease: Secondary | ICD-10-CM | POA: Diagnosis not present

## 2017-03-13 DIAGNOSIS — D631 Anemia in chronic kidney disease: Secondary | ICD-10-CM | POA: Diagnosis not present

## 2017-03-13 DIAGNOSIS — Z4932 Encounter for adequacy testing for peritoneal dialysis: Secondary | ICD-10-CM | POA: Diagnosis not present

## 2017-03-13 DIAGNOSIS — N2581 Secondary hyperparathyroidism of renal origin: Secondary | ICD-10-CM | POA: Diagnosis not present

## 2017-03-13 DIAGNOSIS — D509 Iron deficiency anemia, unspecified: Secondary | ICD-10-CM | POA: Diagnosis not present

## 2017-03-13 DIAGNOSIS — N2589 Other disorders resulting from impaired renal tubular function: Secondary | ICD-10-CM | POA: Diagnosis not present

## 2017-03-14 DIAGNOSIS — N186 End stage renal disease: Secondary | ICD-10-CM | POA: Diagnosis not present

## 2017-03-14 DIAGNOSIS — D631 Anemia in chronic kidney disease: Secondary | ICD-10-CM | POA: Diagnosis not present

## 2017-03-14 DIAGNOSIS — N2589 Other disorders resulting from impaired renal tubular function: Secondary | ICD-10-CM | POA: Diagnosis not present

## 2017-03-14 DIAGNOSIS — N2581 Secondary hyperparathyroidism of renal origin: Secondary | ICD-10-CM | POA: Diagnosis not present

## 2017-03-14 DIAGNOSIS — D509 Iron deficiency anemia, unspecified: Secondary | ICD-10-CM | POA: Diagnosis not present

## 2017-03-14 DIAGNOSIS — Z4932 Encounter for adequacy testing for peritoneal dialysis: Secondary | ICD-10-CM | POA: Diagnosis not present

## 2017-03-15 DIAGNOSIS — N2581 Secondary hyperparathyroidism of renal origin: Secondary | ICD-10-CM | POA: Diagnosis not present

## 2017-03-15 DIAGNOSIS — N186 End stage renal disease: Secondary | ICD-10-CM | POA: Diagnosis not present

## 2017-03-15 DIAGNOSIS — D631 Anemia in chronic kidney disease: Secondary | ICD-10-CM | POA: Diagnosis not present

## 2017-03-15 DIAGNOSIS — N2589 Other disorders resulting from impaired renal tubular function: Secondary | ICD-10-CM | POA: Diagnosis not present

## 2017-03-15 DIAGNOSIS — Z4932 Encounter for adequacy testing for peritoneal dialysis: Secondary | ICD-10-CM | POA: Diagnosis not present

## 2017-03-15 DIAGNOSIS — D509 Iron deficiency anemia, unspecified: Secondary | ICD-10-CM | POA: Diagnosis not present

## 2017-03-16 DIAGNOSIS — Z4932 Encounter for adequacy testing for peritoneal dialysis: Secondary | ICD-10-CM | POA: Diagnosis not present

## 2017-03-16 DIAGNOSIS — N2589 Other disorders resulting from impaired renal tubular function: Secondary | ICD-10-CM | POA: Diagnosis not present

## 2017-03-16 DIAGNOSIS — D509 Iron deficiency anemia, unspecified: Secondary | ICD-10-CM | POA: Diagnosis not present

## 2017-03-16 DIAGNOSIS — D631 Anemia in chronic kidney disease: Secondary | ICD-10-CM | POA: Diagnosis not present

## 2017-03-16 DIAGNOSIS — N2581 Secondary hyperparathyroidism of renal origin: Secondary | ICD-10-CM | POA: Diagnosis not present

## 2017-03-16 DIAGNOSIS — N186 End stage renal disease: Secondary | ICD-10-CM | POA: Diagnosis not present

## 2017-03-17 DIAGNOSIS — D631 Anemia in chronic kidney disease: Secondary | ICD-10-CM | POA: Diagnosis not present

## 2017-03-17 DIAGNOSIS — Z4932 Encounter for adequacy testing for peritoneal dialysis: Secondary | ICD-10-CM | POA: Diagnosis not present

## 2017-03-17 DIAGNOSIS — D509 Iron deficiency anemia, unspecified: Secondary | ICD-10-CM | POA: Diagnosis not present

## 2017-03-17 DIAGNOSIS — N2589 Other disorders resulting from impaired renal tubular function: Secondary | ICD-10-CM | POA: Diagnosis not present

## 2017-03-17 DIAGNOSIS — N186 End stage renal disease: Secondary | ICD-10-CM | POA: Diagnosis not present

## 2017-03-17 DIAGNOSIS — N2581 Secondary hyperparathyroidism of renal origin: Secondary | ICD-10-CM | POA: Diagnosis not present

## 2017-03-18 DIAGNOSIS — N186 End stage renal disease: Secondary | ICD-10-CM | POA: Diagnosis not present

## 2017-03-18 DIAGNOSIS — D509 Iron deficiency anemia, unspecified: Secondary | ICD-10-CM | POA: Diagnosis not present

## 2017-03-18 DIAGNOSIS — Z4932 Encounter for adequacy testing for peritoneal dialysis: Secondary | ICD-10-CM | POA: Diagnosis not present

## 2017-03-18 DIAGNOSIS — N2581 Secondary hyperparathyroidism of renal origin: Secondary | ICD-10-CM | POA: Diagnosis not present

## 2017-03-18 DIAGNOSIS — N2589 Other disorders resulting from impaired renal tubular function: Secondary | ICD-10-CM | POA: Diagnosis not present

## 2017-03-18 DIAGNOSIS — D631 Anemia in chronic kidney disease: Secondary | ICD-10-CM | POA: Diagnosis not present

## 2017-03-19 DIAGNOSIS — N2589 Other disorders resulting from impaired renal tubular function: Secondary | ICD-10-CM | POA: Diagnosis not present

## 2017-03-19 DIAGNOSIS — Z4932 Encounter for adequacy testing for peritoneal dialysis: Secondary | ICD-10-CM | POA: Diagnosis not present

## 2017-03-19 DIAGNOSIS — N2581 Secondary hyperparathyroidism of renal origin: Secondary | ICD-10-CM | POA: Diagnosis not present

## 2017-03-19 DIAGNOSIS — D509 Iron deficiency anemia, unspecified: Secondary | ICD-10-CM | POA: Diagnosis not present

## 2017-03-19 DIAGNOSIS — N186 End stage renal disease: Secondary | ICD-10-CM | POA: Diagnosis not present

## 2017-03-19 DIAGNOSIS — D631 Anemia in chronic kidney disease: Secondary | ICD-10-CM | POA: Diagnosis not present

## 2017-03-20 DIAGNOSIS — N186 End stage renal disease: Secondary | ICD-10-CM | POA: Diagnosis not present

## 2017-03-20 DIAGNOSIS — N2589 Other disorders resulting from impaired renal tubular function: Secondary | ICD-10-CM | POA: Diagnosis not present

## 2017-03-20 DIAGNOSIS — N2581 Secondary hyperparathyroidism of renal origin: Secondary | ICD-10-CM | POA: Diagnosis not present

## 2017-03-20 DIAGNOSIS — Z4932 Encounter for adequacy testing for peritoneal dialysis: Secondary | ICD-10-CM | POA: Diagnosis not present

## 2017-03-20 DIAGNOSIS — D631 Anemia in chronic kidney disease: Secondary | ICD-10-CM | POA: Diagnosis not present

## 2017-03-20 DIAGNOSIS — D509 Iron deficiency anemia, unspecified: Secondary | ICD-10-CM | POA: Diagnosis not present

## 2017-03-21 DIAGNOSIS — N2581 Secondary hyperparathyroidism of renal origin: Secondary | ICD-10-CM | POA: Diagnosis not present

## 2017-03-21 DIAGNOSIS — Z4932 Encounter for adequacy testing for peritoneal dialysis: Secondary | ICD-10-CM | POA: Diagnosis not present

## 2017-03-21 DIAGNOSIS — D631 Anemia in chronic kidney disease: Secondary | ICD-10-CM | POA: Diagnosis not present

## 2017-03-21 DIAGNOSIS — D509 Iron deficiency anemia, unspecified: Secondary | ICD-10-CM | POA: Diagnosis not present

## 2017-03-21 DIAGNOSIS — N2589 Other disorders resulting from impaired renal tubular function: Secondary | ICD-10-CM | POA: Diagnosis not present

## 2017-03-21 DIAGNOSIS — N186 End stage renal disease: Secondary | ICD-10-CM | POA: Diagnosis not present

## 2017-03-22 DIAGNOSIS — D509 Iron deficiency anemia, unspecified: Secondary | ICD-10-CM | POA: Diagnosis not present

## 2017-03-22 DIAGNOSIS — D631 Anemia in chronic kidney disease: Secondary | ICD-10-CM | POA: Diagnosis not present

## 2017-03-22 DIAGNOSIS — Z4932 Encounter for adequacy testing for peritoneal dialysis: Secondary | ICD-10-CM | POA: Diagnosis not present

## 2017-03-22 DIAGNOSIS — N2589 Other disorders resulting from impaired renal tubular function: Secondary | ICD-10-CM | POA: Diagnosis not present

## 2017-03-22 DIAGNOSIS — N186 End stage renal disease: Secondary | ICD-10-CM | POA: Diagnosis not present

## 2017-03-22 DIAGNOSIS — N2581 Secondary hyperparathyroidism of renal origin: Secondary | ICD-10-CM | POA: Diagnosis not present

## 2017-03-23 DIAGNOSIS — N2589 Other disorders resulting from impaired renal tubular function: Secondary | ICD-10-CM | POA: Diagnosis not present

## 2017-03-23 DIAGNOSIS — N186 End stage renal disease: Secondary | ICD-10-CM | POA: Diagnosis not present

## 2017-03-23 DIAGNOSIS — Z4932 Encounter for adequacy testing for peritoneal dialysis: Secondary | ICD-10-CM | POA: Diagnosis not present

## 2017-03-23 DIAGNOSIS — D631 Anemia in chronic kidney disease: Secondary | ICD-10-CM | POA: Diagnosis not present

## 2017-03-23 DIAGNOSIS — N2581 Secondary hyperparathyroidism of renal origin: Secondary | ICD-10-CM | POA: Diagnosis not present

## 2017-03-23 DIAGNOSIS — D509 Iron deficiency anemia, unspecified: Secondary | ICD-10-CM | POA: Diagnosis not present

## 2017-03-24 DIAGNOSIS — Z4932 Encounter for adequacy testing for peritoneal dialysis: Secondary | ICD-10-CM | POA: Diagnosis not present

## 2017-03-24 DIAGNOSIS — D509 Iron deficiency anemia, unspecified: Secondary | ICD-10-CM | POA: Diagnosis not present

## 2017-03-24 DIAGNOSIS — N186 End stage renal disease: Secondary | ICD-10-CM | POA: Diagnosis not present

## 2017-03-24 DIAGNOSIS — D631 Anemia in chronic kidney disease: Secondary | ICD-10-CM | POA: Diagnosis not present

## 2017-03-24 DIAGNOSIS — N2581 Secondary hyperparathyroidism of renal origin: Secondary | ICD-10-CM | POA: Diagnosis not present

## 2017-03-24 DIAGNOSIS — N2589 Other disorders resulting from impaired renal tubular function: Secondary | ICD-10-CM | POA: Diagnosis not present

## 2017-03-25 DIAGNOSIS — N186 End stage renal disease: Secondary | ICD-10-CM | POA: Diagnosis not present

## 2017-03-25 DIAGNOSIS — Z4932 Encounter for adequacy testing for peritoneal dialysis: Secondary | ICD-10-CM | POA: Diagnosis not present

## 2017-03-25 DIAGNOSIS — D509 Iron deficiency anemia, unspecified: Secondary | ICD-10-CM | POA: Diagnosis not present

## 2017-03-25 DIAGNOSIS — N2589 Other disorders resulting from impaired renal tubular function: Secondary | ICD-10-CM | POA: Diagnosis not present

## 2017-03-25 DIAGNOSIS — N2581 Secondary hyperparathyroidism of renal origin: Secondary | ICD-10-CM | POA: Diagnosis not present

## 2017-03-25 DIAGNOSIS — D631 Anemia in chronic kidney disease: Secondary | ICD-10-CM | POA: Diagnosis not present

## 2017-03-26 DIAGNOSIS — Z4932 Encounter for adequacy testing for peritoneal dialysis: Secondary | ICD-10-CM | POA: Diagnosis not present

## 2017-03-26 DIAGNOSIS — N2589 Other disorders resulting from impaired renal tubular function: Secondary | ICD-10-CM | POA: Diagnosis not present

## 2017-03-26 DIAGNOSIS — N2581 Secondary hyperparathyroidism of renal origin: Secondary | ICD-10-CM | POA: Diagnosis not present

## 2017-03-26 DIAGNOSIS — D509 Iron deficiency anemia, unspecified: Secondary | ICD-10-CM | POA: Diagnosis not present

## 2017-03-26 DIAGNOSIS — N186 End stage renal disease: Secondary | ICD-10-CM | POA: Diagnosis not present

## 2017-03-26 DIAGNOSIS — D631 Anemia in chronic kidney disease: Secondary | ICD-10-CM | POA: Diagnosis not present

## 2017-03-27 DIAGNOSIS — D631 Anemia in chronic kidney disease: Secondary | ICD-10-CM | POA: Diagnosis not present

## 2017-03-27 DIAGNOSIS — Z4932 Encounter for adequacy testing for peritoneal dialysis: Secondary | ICD-10-CM | POA: Diagnosis not present

## 2017-03-27 DIAGNOSIS — D509 Iron deficiency anemia, unspecified: Secondary | ICD-10-CM | POA: Diagnosis not present

## 2017-03-27 DIAGNOSIS — N2581 Secondary hyperparathyroidism of renal origin: Secondary | ICD-10-CM | POA: Diagnosis not present

## 2017-03-27 DIAGNOSIS — N186 End stage renal disease: Secondary | ICD-10-CM | POA: Diagnosis not present

## 2017-03-27 DIAGNOSIS — N2589 Other disorders resulting from impaired renal tubular function: Secondary | ICD-10-CM | POA: Diagnosis not present

## 2017-03-28 DIAGNOSIS — D509 Iron deficiency anemia, unspecified: Secondary | ICD-10-CM | POA: Diagnosis not present

## 2017-03-28 DIAGNOSIS — D631 Anemia in chronic kidney disease: Secondary | ICD-10-CM | POA: Diagnosis not present

## 2017-03-28 DIAGNOSIS — N186 End stage renal disease: Secondary | ICD-10-CM | POA: Diagnosis not present

## 2017-03-28 DIAGNOSIS — Z4932 Encounter for adequacy testing for peritoneal dialysis: Secondary | ICD-10-CM | POA: Diagnosis not present

## 2017-03-28 DIAGNOSIS — N2581 Secondary hyperparathyroidism of renal origin: Secondary | ICD-10-CM | POA: Diagnosis not present

## 2017-03-28 DIAGNOSIS — N2589 Other disorders resulting from impaired renal tubular function: Secondary | ICD-10-CM | POA: Diagnosis not present

## 2017-03-29 DIAGNOSIS — D631 Anemia in chronic kidney disease: Secondary | ICD-10-CM | POA: Diagnosis not present

## 2017-03-29 DIAGNOSIS — D509 Iron deficiency anemia, unspecified: Secondary | ICD-10-CM | POA: Diagnosis not present

## 2017-03-29 DIAGNOSIS — Z4932 Encounter for adequacy testing for peritoneal dialysis: Secondary | ICD-10-CM | POA: Diagnosis not present

## 2017-03-29 DIAGNOSIS — N2589 Other disorders resulting from impaired renal tubular function: Secondary | ICD-10-CM | POA: Diagnosis not present

## 2017-03-29 DIAGNOSIS — N2581 Secondary hyperparathyroidism of renal origin: Secondary | ICD-10-CM | POA: Diagnosis not present

## 2017-03-29 DIAGNOSIS — N186 End stage renal disease: Secondary | ICD-10-CM | POA: Diagnosis not present

## 2017-03-30 DIAGNOSIS — D509 Iron deficiency anemia, unspecified: Secondary | ICD-10-CM | POA: Diagnosis not present

## 2017-03-30 DIAGNOSIS — N186 End stage renal disease: Secondary | ICD-10-CM | POA: Diagnosis not present

## 2017-03-30 DIAGNOSIS — N2581 Secondary hyperparathyroidism of renal origin: Secondary | ICD-10-CM | POA: Diagnosis not present

## 2017-03-30 DIAGNOSIS — D631 Anemia in chronic kidney disease: Secondary | ICD-10-CM | POA: Diagnosis not present

## 2017-03-30 DIAGNOSIS — Z4932 Encounter for adequacy testing for peritoneal dialysis: Secondary | ICD-10-CM | POA: Diagnosis not present

## 2017-03-30 DIAGNOSIS — N2589 Other disorders resulting from impaired renal tubular function: Secondary | ICD-10-CM | POA: Diagnosis not present

## 2017-03-31 DIAGNOSIS — N2581 Secondary hyperparathyroidism of renal origin: Secondary | ICD-10-CM | POA: Diagnosis not present

## 2017-03-31 DIAGNOSIS — D631 Anemia in chronic kidney disease: Secondary | ICD-10-CM | POA: Diagnosis not present

## 2017-03-31 DIAGNOSIS — D509 Iron deficiency anemia, unspecified: Secondary | ICD-10-CM | POA: Diagnosis not present

## 2017-03-31 DIAGNOSIS — N186 End stage renal disease: Secondary | ICD-10-CM | POA: Diagnosis not present

## 2017-03-31 DIAGNOSIS — N2589 Other disorders resulting from impaired renal tubular function: Secondary | ICD-10-CM | POA: Diagnosis not present

## 2017-03-31 DIAGNOSIS — Z4932 Encounter for adequacy testing for peritoneal dialysis: Secondary | ICD-10-CM | POA: Diagnosis not present

## 2017-04-01 DIAGNOSIS — Z4932 Encounter for adequacy testing for peritoneal dialysis: Secondary | ICD-10-CM | POA: Diagnosis not present

## 2017-04-01 DIAGNOSIS — N2581 Secondary hyperparathyroidism of renal origin: Secondary | ICD-10-CM | POA: Diagnosis not present

## 2017-04-01 DIAGNOSIS — N2589 Other disorders resulting from impaired renal tubular function: Secondary | ICD-10-CM | POA: Diagnosis not present

## 2017-04-01 DIAGNOSIS — D631 Anemia in chronic kidney disease: Secondary | ICD-10-CM | POA: Diagnosis not present

## 2017-04-01 DIAGNOSIS — N186 End stage renal disease: Secondary | ICD-10-CM | POA: Diagnosis not present

## 2017-04-01 DIAGNOSIS — D509 Iron deficiency anemia, unspecified: Secondary | ICD-10-CM | POA: Diagnosis not present

## 2017-04-02 DIAGNOSIS — N186 End stage renal disease: Secondary | ICD-10-CM | POA: Diagnosis not present

## 2017-04-02 DIAGNOSIS — D631 Anemia in chronic kidney disease: Secondary | ICD-10-CM | POA: Diagnosis not present

## 2017-04-02 DIAGNOSIS — N2581 Secondary hyperparathyroidism of renal origin: Secondary | ICD-10-CM | POA: Diagnosis not present

## 2017-04-02 DIAGNOSIS — N2589 Other disorders resulting from impaired renal tubular function: Secondary | ICD-10-CM | POA: Diagnosis not present

## 2017-04-02 DIAGNOSIS — Z4932 Encounter for adequacy testing for peritoneal dialysis: Secondary | ICD-10-CM | POA: Diagnosis not present

## 2017-04-02 DIAGNOSIS — D509 Iron deficiency anemia, unspecified: Secondary | ICD-10-CM | POA: Diagnosis not present

## 2017-04-03 DIAGNOSIS — D509 Iron deficiency anemia, unspecified: Secondary | ICD-10-CM | POA: Diagnosis not present

## 2017-04-03 DIAGNOSIS — N2581 Secondary hyperparathyroidism of renal origin: Secondary | ICD-10-CM | POA: Diagnosis not present

## 2017-04-03 DIAGNOSIS — Z4932 Encounter for adequacy testing for peritoneal dialysis: Secondary | ICD-10-CM | POA: Diagnosis not present

## 2017-04-03 DIAGNOSIS — N2589 Other disorders resulting from impaired renal tubular function: Secondary | ICD-10-CM | POA: Diagnosis not present

## 2017-04-03 DIAGNOSIS — N2889 Other specified disorders of kidney and ureter: Secondary | ICD-10-CM | POA: Diagnosis not present

## 2017-04-03 DIAGNOSIS — Z992 Dependence on renal dialysis: Secondary | ICD-10-CM | POA: Diagnosis not present

## 2017-04-03 DIAGNOSIS — N186 End stage renal disease: Secondary | ICD-10-CM | POA: Diagnosis not present

## 2017-04-03 DIAGNOSIS — D631 Anemia in chronic kidney disease: Secondary | ICD-10-CM | POA: Diagnosis not present

## 2017-04-04 DIAGNOSIS — D631 Anemia in chronic kidney disease: Secondary | ICD-10-CM | POA: Diagnosis not present

## 2017-04-04 DIAGNOSIS — D509 Iron deficiency anemia, unspecified: Secondary | ICD-10-CM | POA: Diagnosis not present

## 2017-04-04 DIAGNOSIS — N186 End stage renal disease: Secondary | ICD-10-CM | POA: Diagnosis not present

## 2017-04-04 DIAGNOSIS — E440 Moderate protein-calorie malnutrition: Secondary | ICD-10-CM | POA: Diagnosis not present

## 2017-04-04 DIAGNOSIS — N2581 Secondary hyperparathyroidism of renal origin: Secondary | ICD-10-CM | POA: Diagnosis not present

## 2017-04-04 DIAGNOSIS — Z79899 Other long term (current) drug therapy: Secondary | ICD-10-CM | POA: Diagnosis not present

## 2017-04-05 DIAGNOSIS — N2581 Secondary hyperparathyroidism of renal origin: Secondary | ICD-10-CM | POA: Diagnosis not present

## 2017-04-05 DIAGNOSIS — Z79899 Other long term (current) drug therapy: Secondary | ICD-10-CM | POA: Diagnosis not present

## 2017-04-05 DIAGNOSIS — D631 Anemia in chronic kidney disease: Secondary | ICD-10-CM | POA: Diagnosis not present

## 2017-04-05 DIAGNOSIS — D509 Iron deficiency anemia, unspecified: Secondary | ICD-10-CM | POA: Diagnosis not present

## 2017-04-05 DIAGNOSIS — E440 Moderate protein-calorie malnutrition: Secondary | ICD-10-CM | POA: Diagnosis not present

## 2017-04-05 DIAGNOSIS — N186 End stage renal disease: Secondary | ICD-10-CM | POA: Diagnosis not present

## 2017-04-05 DIAGNOSIS — R8299 Other abnormal findings in urine: Secondary | ICD-10-CM | POA: Diagnosis not present

## 2017-04-06 DIAGNOSIS — D509 Iron deficiency anemia, unspecified: Secondary | ICD-10-CM | POA: Diagnosis not present

## 2017-04-06 DIAGNOSIS — N2581 Secondary hyperparathyroidism of renal origin: Secondary | ICD-10-CM | POA: Diagnosis not present

## 2017-04-06 DIAGNOSIS — D631 Anemia in chronic kidney disease: Secondary | ICD-10-CM | POA: Diagnosis not present

## 2017-04-06 DIAGNOSIS — E440 Moderate protein-calorie malnutrition: Secondary | ICD-10-CM | POA: Diagnosis not present

## 2017-04-06 DIAGNOSIS — N186 End stage renal disease: Secondary | ICD-10-CM | POA: Diagnosis not present

## 2017-04-06 DIAGNOSIS — Z79899 Other long term (current) drug therapy: Secondary | ICD-10-CM | POA: Diagnosis not present

## 2017-04-07 DIAGNOSIS — Z79899 Other long term (current) drug therapy: Secondary | ICD-10-CM | POA: Diagnosis not present

## 2017-04-07 DIAGNOSIS — N186 End stage renal disease: Secondary | ICD-10-CM | POA: Diagnosis not present

## 2017-04-07 DIAGNOSIS — D509 Iron deficiency anemia, unspecified: Secondary | ICD-10-CM | POA: Diagnosis not present

## 2017-04-07 DIAGNOSIS — E440 Moderate protein-calorie malnutrition: Secondary | ICD-10-CM | POA: Diagnosis not present

## 2017-04-07 DIAGNOSIS — D631 Anemia in chronic kidney disease: Secondary | ICD-10-CM | POA: Diagnosis not present

## 2017-04-07 DIAGNOSIS — N2581 Secondary hyperparathyroidism of renal origin: Secondary | ICD-10-CM | POA: Diagnosis not present

## 2017-04-08 DIAGNOSIS — N2581 Secondary hyperparathyroidism of renal origin: Secondary | ICD-10-CM | POA: Diagnosis not present

## 2017-04-08 DIAGNOSIS — D631 Anemia in chronic kidney disease: Secondary | ICD-10-CM | POA: Diagnosis not present

## 2017-04-08 DIAGNOSIS — E440 Moderate protein-calorie malnutrition: Secondary | ICD-10-CM | POA: Diagnosis not present

## 2017-04-08 DIAGNOSIS — Z79899 Other long term (current) drug therapy: Secondary | ICD-10-CM | POA: Diagnosis not present

## 2017-04-08 DIAGNOSIS — N186 End stage renal disease: Secondary | ICD-10-CM | POA: Diagnosis not present

## 2017-04-08 DIAGNOSIS — D509 Iron deficiency anemia, unspecified: Secondary | ICD-10-CM | POA: Diagnosis not present

## 2017-04-09 DIAGNOSIS — N2581 Secondary hyperparathyroidism of renal origin: Secondary | ICD-10-CM | POA: Diagnosis not present

## 2017-04-09 DIAGNOSIS — E440 Moderate protein-calorie malnutrition: Secondary | ICD-10-CM | POA: Diagnosis not present

## 2017-04-09 DIAGNOSIS — N186 End stage renal disease: Secondary | ICD-10-CM | POA: Diagnosis not present

## 2017-04-09 DIAGNOSIS — D509 Iron deficiency anemia, unspecified: Secondary | ICD-10-CM | POA: Diagnosis not present

## 2017-04-09 DIAGNOSIS — D631 Anemia in chronic kidney disease: Secondary | ICD-10-CM | POA: Diagnosis not present

## 2017-04-09 DIAGNOSIS — Z79899 Other long term (current) drug therapy: Secondary | ICD-10-CM | POA: Diagnosis not present

## 2017-04-10 DIAGNOSIS — Z79899 Other long term (current) drug therapy: Secondary | ICD-10-CM | POA: Diagnosis not present

## 2017-04-10 DIAGNOSIS — N186 End stage renal disease: Secondary | ICD-10-CM | POA: Diagnosis not present

## 2017-04-10 DIAGNOSIS — E440 Moderate protein-calorie malnutrition: Secondary | ICD-10-CM | POA: Diagnosis not present

## 2017-04-10 DIAGNOSIS — N2581 Secondary hyperparathyroidism of renal origin: Secondary | ICD-10-CM | POA: Diagnosis not present

## 2017-04-10 DIAGNOSIS — D631 Anemia in chronic kidney disease: Secondary | ICD-10-CM | POA: Diagnosis not present

## 2017-04-10 DIAGNOSIS — D509 Iron deficiency anemia, unspecified: Secondary | ICD-10-CM | POA: Diagnosis not present

## 2017-04-11 DIAGNOSIS — D509 Iron deficiency anemia, unspecified: Secondary | ICD-10-CM | POA: Diagnosis not present

## 2017-04-11 DIAGNOSIS — E440 Moderate protein-calorie malnutrition: Secondary | ICD-10-CM | POA: Diagnosis not present

## 2017-04-11 DIAGNOSIS — N186 End stage renal disease: Secondary | ICD-10-CM | POA: Diagnosis not present

## 2017-04-11 DIAGNOSIS — Z79899 Other long term (current) drug therapy: Secondary | ICD-10-CM | POA: Diagnosis not present

## 2017-04-11 DIAGNOSIS — D631 Anemia in chronic kidney disease: Secondary | ICD-10-CM | POA: Diagnosis not present

## 2017-04-11 DIAGNOSIS — N2581 Secondary hyperparathyroidism of renal origin: Secondary | ICD-10-CM | POA: Diagnosis not present

## 2017-04-12 DIAGNOSIS — D509 Iron deficiency anemia, unspecified: Secondary | ICD-10-CM | POA: Diagnosis not present

## 2017-04-12 DIAGNOSIS — N2581 Secondary hyperparathyroidism of renal origin: Secondary | ICD-10-CM | POA: Diagnosis not present

## 2017-04-12 DIAGNOSIS — D631 Anemia in chronic kidney disease: Secondary | ICD-10-CM | POA: Diagnosis not present

## 2017-04-12 DIAGNOSIS — N186 End stage renal disease: Secondary | ICD-10-CM | POA: Diagnosis not present

## 2017-04-12 DIAGNOSIS — Z79899 Other long term (current) drug therapy: Secondary | ICD-10-CM | POA: Diagnosis not present

## 2017-04-12 DIAGNOSIS — E440 Moderate protein-calorie malnutrition: Secondary | ICD-10-CM | POA: Diagnosis not present

## 2017-04-13 DIAGNOSIS — D509 Iron deficiency anemia, unspecified: Secondary | ICD-10-CM | POA: Diagnosis not present

## 2017-04-13 DIAGNOSIS — Z79899 Other long term (current) drug therapy: Secondary | ICD-10-CM | POA: Diagnosis not present

## 2017-04-13 DIAGNOSIS — N186 End stage renal disease: Secondary | ICD-10-CM | POA: Diagnosis not present

## 2017-04-13 DIAGNOSIS — D631 Anemia in chronic kidney disease: Secondary | ICD-10-CM | POA: Diagnosis not present

## 2017-04-13 DIAGNOSIS — E440 Moderate protein-calorie malnutrition: Secondary | ICD-10-CM | POA: Diagnosis not present

## 2017-04-13 DIAGNOSIS — N2581 Secondary hyperparathyroidism of renal origin: Secondary | ICD-10-CM | POA: Diagnosis not present

## 2017-04-14 DIAGNOSIS — Z79899 Other long term (current) drug therapy: Secondary | ICD-10-CM | POA: Diagnosis not present

## 2017-04-14 DIAGNOSIS — N2581 Secondary hyperparathyroidism of renal origin: Secondary | ICD-10-CM | POA: Diagnosis not present

## 2017-04-14 DIAGNOSIS — D509 Iron deficiency anemia, unspecified: Secondary | ICD-10-CM | POA: Diagnosis not present

## 2017-04-14 DIAGNOSIS — D631 Anemia in chronic kidney disease: Secondary | ICD-10-CM | POA: Diagnosis not present

## 2017-04-14 DIAGNOSIS — E440 Moderate protein-calorie malnutrition: Secondary | ICD-10-CM | POA: Diagnosis not present

## 2017-04-14 DIAGNOSIS — N186 End stage renal disease: Secondary | ICD-10-CM | POA: Diagnosis not present

## 2017-04-15 DIAGNOSIS — E440 Moderate protein-calorie malnutrition: Secondary | ICD-10-CM | POA: Diagnosis not present

## 2017-04-15 DIAGNOSIS — N186 End stage renal disease: Secondary | ICD-10-CM | POA: Diagnosis not present

## 2017-04-15 DIAGNOSIS — Z79899 Other long term (current) drug therapy: Secondary | ICD-10-CM | POA: Diagnosis not present

## 2017-04-15 DIAGNOSIS — N2581 Secondary hyperparathyroidism of renal origin: Secondary | ICD-10-CM | POA: Diagnosis not present

## 2017-04-15 DIAGNOSIS — D631 Anemia in chronic kidney disease: Secondary | ICD-10-CM | POA: Diagnosis not present

## 2017-04-15 DIAGNOSIS — D509 Iron deficiency anemia, unspecified: Secondary | ICD-10-CM | POA: Diagnosis not present

## 2017-04-16 DIAGNOSIS — D509 Iron deficiency anemia, unspecified: Secondary | ICD-10-CM | POA: Diagnosis not present

## 2017-04-16 DIAGNOSIS — Z79899 Other long term (current) drug therapy: Secondary | ICD-10-CM | POA: Diagnosis not present

## 2017-04-16 DIAGNOSIS — D631 Anemia in chronic kidney disease: Secondary | ICD-10-CM | POA: Diagnosis not present

## 2017-04-16 DIAGNOSIS — E440 Moderate protein-calorie malnutrition: Secondary | ICD-10-CM | POA: Diagnosis not present

## 2017-04-16 DIAGNOSIS — N186 End stage renal disease: Secondary | ICD-10-CM | POA: Diagnosis not present

## 2017-04-16 DIAGNOSIS — N2581 Secondary hyperparathyroidism of renal origin: Secondary | ICD-10-CM | POA: Diagnosis not present

## 2017-04-17 DIAGNOSIS — D509 Iron deficiency anemia, unspecified: Secondary | ICD-10-CM | POA: Diagnosis not present

## 2017-04-17 DIAGNOSIS — N2581 Secondary hyperparathyroidism of renal origin: Secondary | ICD-10-CM | POA: Diagnosis not present

## 2017-04-17 DIAGNOSIS — D631 Anemia in chronic kidney disease: Secondary | ICD-10-CM | POA: Diagnosis not present

## 2017-04-17 DIAGNOSIS — E440 Moderate protein-calorie malnutrition: Secondary | ICD-10-CM | POA: Diagnosis not present

## 2017-04-17 DIAGNOSIS — Z79899 Other long term (current) drug therapy: Secondary | ICD-10-CM | POA: Diagnosis not present

## 2017-04-17 DIAGNOSIS — N186 End stage renal disease: Secondary | ICD-10-CM | POA: Diagnosis not present

## 2017-04-18 DIAGNOSIS — D509 Iron deficiency anemia, unspecified: Secondary | ICD-10-CM | POA: Diagnosis not present

## 2017-04-18 DIAGNOSIS — Z79899 Other long term (current) drug therapy: Secondary | ICD-10-CM | POA: Diagnosis not present

## 2017-04-18 DIAGNOSIS — D631 Anemia in chronic kidney disease: Secondary | ICD-10-CM | POA: Diagnosis not present

## 2017-04-18 DIAGNOSIS — N2581 Secondary hyperparathyroidism of renal origin: Secondary | ICD-10-CM | POA: Diagnosis not present

## 2017-04-18 DIAGNOSIS — N186 End stage renal disease: Secondary | ICD-10-CM | POA: Diagnosis not present

## 2017-04-18 DIAGNOSIS — E440 Moderate protein-calorie malnutrition: Secondary | ICD-10-CM | POA: Diagnosis not present

## 2017-04-19 DIAGNOSIS — E440 Moderate protein-calorie malnutrition: Secondary | ICD-10-CM | POA: Diagnosis not present

## 2017-04-19 DIAGNOSIS — D631 Anemia in chronic kidney disease: Secondary | ICD-10-CM | POA: Diagnosis not present

## 2017-04-19 DIAGNOSIS — N186 End stage renal disease: Secondary | ICD-10-CM | POA: Diagnosis not present

## 2017-04-19 DIAGNOSIS — Z79899 Other long term (current) drug therapy: Secondary | ICD-10-CM | POA: Diagnosis not present

## 2017-04-19 DIAGNOSIS — D509 Iron deficiency anemia, unspecified: Secondary | ICD-10-CM | POA: Diagnosis not present

## 2017-04-19 DIAGNOSIS — N2581 Secondary hyperparathyroidism of renal origin: Secondary | ICD-10-CM | POA: Diagnosis not present

## 2017-04-20 DIAGNOSIS — D509 Iron deficiency anemia, unspecified: Secondary | ICD-10-CM | POA: Diagnosis not present

## 2017-04-20 DIAGNOSIS — N186 End stage renal disease: Secondary | ICD-10-CM | POA: Diagnosis not present

## 2017-04-20 DIAGNOSIS — E440 Moderate protein-calorie malnutrition: Secondary | ICD-10-CM | POA: Diagnosis not present

## 2017-04-20 DIAGNOSIS — N2581 Secondary hyperparathyroidism of renal origin: Secondary | ICD-10-CM | POA: Diagnosis not present

## 2017-04-20 DIAGNOSIS — Z79899 Other long term (current) drug therapy: Secondary | ICD-10-CM | POA: Diagnosis not present

## 2017-04-20 DIAGNOSIS — D631 Anemia in chronic kidney disease: Secondary | ICD-10-CM | POA: Diagnosis not present

## 2017-04-21 DIAGNOSIS — N2581 Secondary hyperparathyroidism of renal origin: Secondary | ICD-10-CM | POA: Diagnosis not present

## 2017-04-21 DIAGNOSIS — D509 Iron deficiency anemia, unspecified: Secondary | ICD-10-CM | POA: Diagnosis not present

## 2017-04-21 DIAGNOSIS — Z79899 Other long term (current) drug therapy: Secondary | ICD-10-CM | POA: Diagnosis not present

## 2017-04-21 DIAGNOSIS — D631 Anemia in chronic kidney disease: Secondary | ICD-10-CM | POA: Diagnosis not present

## 2017-04-21 DIAGNOSIS — N186 End stage renal disease: Secondary | ICD-10-CM | POA: Diagnosis not present

## 2017-04-21 DIAGNOSIS — E440 Moderate protein-calorie malnutrition: Secondary | ICD-10-CM | POA: Diagnosis not present

## 2017-04-22 DIAGNOSIS — D631 Anemia in chronic kidney disease: Secondary | ICD-10-CM | POA: Diagnosis not present

## 2017-04-22 DIAGNOSIS — E440 Moderate protein-calorie malnutrition: Secondary | ICD-10-CM | POA: Diagnosis not present

## 2017-04-22 DIAGNOSIS — Z79899 Other long term (current) drug therapy: Secondary | ICD-10-CM | POA: Diagnosis not present

## 2017-04-22 DIAGNOSIS — N186 End stage renal disease: Secondary | ICD-10-CM | POA: Diagnosis not present

## 2017-04-22 DIAGNOSIS — N2581 Secondary hyperparathyroidism of renal origin: Secondary | ICD-10-CM | POA: Diagnosis not present

## 2017-04-22 DIAGNOSIS — D509 Iron deficiency anemia, unspecified: Secondary | ICD-10-CM | POA: Diagnosis not present

## 2017-04-23 DIAGNOSIS — D631 Anemia in chronic kidney disease: Secondary | ICD-10-CM | POA: Diagnosis not present

## 2017-04-23 DIAGNOSIS — D509 Iron deficiency anemia, unspecified: Secondary | ICD-10-CM | POA: Diagnosis not present

## 2017-04-23 DIAGNOSIS — E440 Moderate protein-calorie malnutrition: Secondary | ICD-10-CM | POA: Diagnosis not present

## 2017-04-23 DIAGNOSIS — N2581 Secondary hyperparathyroidism of renal origin: Secondary | ICD-10-CM | POA: Diagnosis not present

## 2017-04-23 DIAGNOSIS — N186 End stage renal disease: Secondary | ICD-10-CM | POA: Diagnosis not present

## 2017-04-23 DIAGNOSIS — Z79899 Other long term (current) drug therapy: Secondary | ICD-10-CM | POA: Diagnosis not present

## 2017-04-24 DIAGNOSIS — N2581 Secondary hyperparathyroidism of renal origin: Secondary | ICD-10-CM | POA: Diagnosis not present

## 2017-04-24 DIAGNOSIS — D631 Anemia in chronic kidney disease: Secondary | ICD-10-CM | POA: Diagnosis not present

## 2017-04-24 DIAGNOSIS — E440 Moderate protein-calorie malnutrition: Secondary | ICD-10-CM | POA: Diagnosis not present

## 2017-04-24 DIAGNOSIS — D509 Iron deficiency anemia, unspecified: Secondary | ICD-10-CM | POA: Diagnosis not present

## 2017-04-24 DIAGNOSIS — Z79899 Other long term (current) drug therapy: Secondary | ICD-10-CM | POA: Diagnosis not present

## 2017-04-24 DIAGNOSIS — N186 End stage renal disease: Secondary | ICD-10-CM | POA: Diagnosis not present

## 2017-04-25 DIAGNOSIS — E440 Moderate protein-calorie malnutrition: Secondary | ICD-10-CM | POA: Diagnosis not present

## 2017-04-25 DIAGNOSIS — Z79899 Other long term (current) drug therapy: Secondary | ICD-10-CM | POA: Diagnosis not present

## 2017-04-25 DIAGNOSIS — D509 Iron deficiency anemia, unspecified: Secondary | ICD-10-CM | POA: Diagnosis not present

## 2017-04-25 DIAGNOSIS — N2581 Secondary hyperparathyroidism of renal origin: Secondary | ICD-10-CM | POA: Diagnosis not present

## 2017-04-25 DIAGNOSIS — D631 Anemia in chronic kidney disease: Secondary | ICD-10-CM | POA: Diagnosis not present

## 2017-04-25 DIAGNOSIS — N186 End stage renal disease: Secondary | ICD-10-CM | POA: Diagnosis not present

## 2017-04-26 DIAGNOSIS — D509 Iron deficiency anemia, unspecified: Secondary | ICD-10-CM | POA: Diagnosis not present

## 2017-04-26 DIAGNOSIS — E440 Moderate protein-calorie malnutrition: Secondary | ICD-10-CM | POA: Diagnosis not present

## 2017-04-26 DIAGNOSIS — N2581 Secondary hyperparathyroidism of renal origin: Secondary | ICD-10-CM | POA: Diagnosis not present

## 2017-04-26 DIAGNOSIS — Z79899 Other long term (current) drug therapy: Secondary | ICD-10-CM | POA: Diagnosis not present

## 2017-04-26 DIAGNOSIS — N186 End stage renal disease: Secondary | ICD-10-CM | POA: Diagnosis not present

## 2017-04-26 DIAGNOSIS — D631 Anemia in chronic kidney disease: Secondary | ICD-10-CM | POA: Diagnosis not present

## 2017-04-27 DIAGNOSIS — E440 Moderate protein-calorie malnutrition: Secondary | ICD-10-CM | POA: Diagnosis not present

## 2017-04-27 DIAGNOSIS — D631 Anemia in chronic kidney disease: Secondary | ICD-10-CM | POA: Diagnosis not present

## 2017-04-27 DIAGNOSIS — N186 End stage renal disease: Secondary | ICD-10-CM | POA: Diagnosis not present

## 2017-04-27 DIAGNOSIS — D509 Iron deficiency anemia, unspecified: Secondary | ICD-10-CM | POA: Diagnosis not present

## 2017-04-27 DIAGNOSIS — N2581 Secondary hyperparathyroidism of renal origin: Secondary | ICD-10-CM | POA: Diagnosis not present

## 2017-04-27 DIAGNOSIS — Z79899 Other long term (current) drug therapy: Secondary | ICD-10-CM | POA: Diagnosis not present

## 2017-04-28 DIAGNOSIS — D509 Iron deficiency anemia, unspecified: Secondary | ICD-10-CM | POA: Diagnosis not present

## 2017-04-28 DIAGNOSIS — E440 Moderate protein-calorie malnutrition: Secondary | ICD-10-CM | POA: Diagnosis not present

## 2017-04-28 DIAGNOSIS — D631 Anemia in chronic kidney disease: Secondary | ICD-10-CM | POA: Diagnosis not present

## 2017-04-28 DIAGNOSIS — N186 End stage renal disease: Secondary | ICD-10-CM | POA: Diagnosis not present

## 2017-04-28 DIAGNOSIS — N2581 Secondary hyperparathyroidism of renal origin: Secondary | ICD-10-CM | POA: Diagnosis not present

## 2017-04-28 DIAGNOSIS — Z79899 Other long term (current) drug therapy: Secondary | ICD-10-CM | POA: Diagnosis not present

## 2017-04-29 DIAGNOSIS — Z79899 Other long term (current) drug therapy: Secondary | ICD-10-CM | POA: Diagnosis not present

## 2017-04-29 DIAGNOSIS — D509 Iron deficiency anemia, unspecified: Secondary | ICD-10-CM | POA: Diagnosis not present

## 2017-04-29 DIAGNOSIS — D631 Anemia in chronic kidney disease: Secondary | ICD-10-CM | POA: Diagnosis not present

## 2017-04-29 DIAGNOSIS — N2581 Secondary hyperparathyroidism of renal origin: Secondary | ICD-10-CM | POA: Diagnosis not present

## 2017-04-29 DIAGNOSIS — E440 Moderate protein-calorie malnutrition: Secondary | ICD-10-CM | POA: Diagnosis not present

## 2017-04-29 DIAGNOSIS — N186 End stage renal disease: Secondary | ICD-10-CM | POA: Diagnosis not present

## 2017-04-30 DIAGNOSIS — D631 Anemia in chronic kidney disease: Secondary | ICD-10-CM | POA: Diagnosis not present

## 2017-04-30 DIAGNOSIS — Z79899 Other long term (current) drug therapy: Secondary | ICD-10-CM | POA: Diagnosis not present

## 2017-04-30 DIAGNOSIS — N2581 Secondary hyperparathyroidism of renal origin: Secondary | ICD-10-CM | POA: Diagnosis not present

## 2017-04-30 DIAGNOSIS — N186 End stage renal disease: Secondary | ICD-10-CM | POA: Diagnosis not present

## 2017-04-30 DIAGNOSIS — E440 Moderate protein-calorie malnutrition: Secondary | ICD-10-CM | POA: Diagnosis not present

## 2017-04-30 DIAGNOSIS — D509 Iron deficiency anemia, unspecified: Secondary | ICD-10-CM | POA: Diagnosis not present

## 2017-05-01 DIAGNOSIS — E440 Moderate protein-calorie malnutrition: Secondary | ICD-10-CM | POA: Diagnosis not present

## 2017-05-01 DIAGNOSIS — Z79899 Other long term (current) drug therapy: Secondary | ICD-10-CM | POA: Diagnosis not present

## 2017-05-01 DIAGNOSIS — N2581 Secondary hyperparathyroidism of renal origin: Secondary | ICD-10-CM | POA: Diagnosis not present

## 2017-05-01 DIAGNOSIS — D631 Anemia in chronic kidney disease: Secondary | ICD-10-CM | POA: Diagnosis not present

## 2017-05-01 DIAGNOSIS — D509 Iron deficiency anemia, unspecified: Secondary | ICD-10-CM | POA: Diagnosis not present

## 2017-05-01 DIAGNOSIS — N186 End stage renal disease: Secondary | ICD-10-CM | POA: Diagnosis not present

## 2017-05-02 DIAGNOSIS — N2581 Secondary hyperparathyroidism of renal origin: Secondary | ICD-10-CM | POA: Diagnosis not present

## 2017-05-02 DIAGNOSIS — E440 Moderate protein-calorie malnutrition: Secondary | ICD-10-CM | POA: Diagnosis not present

## 2017-05-02 DIAGNOSIS — D631 Anemia in chronic kidney disease: Secondary | ICD-10-CM | POA: Diagnosis not present

## 2017-05-02 DIAGNOSIS — D509 Iron deficiency anemia, unspecified: Secondary | ICD-10-CM | POA: Diagnosis not present

## 2017-05-02 DIAGNOSIS — N186 End stage renal disease: Secondary | ICD-10-CM | POA: Diagnosis not present

## 2017-05-02 DIAGNOSIS — Z79899 Other long term (current) drug therapy: Secondary | ICD-10-CM | POA: Diagnosis not present

## 2017-05-03 DIAGNOSIS — D509 Iron deficiency anemia, unspecified: Secondary | ICD-10-CM | POA: Diagnosis not present

## 2017-05-03 DIAGNOSIS — E440 Moderate protein-calorie malnutrition: Secondary | ICD-10-CM | POA: Diagnosis not present

## 2017-05-03 DIAGNOSIS — N2581 Secondary hyperparathyroidism of renal origin: Secondary | ICD-10-CM | POA: Diagnosis not present

## 2017-05-03 DIAGNOSIS — D631 Anemia in chronic kidney disease: Secondary | ICD-10-CM | POA: Diagnosis not present

## 2017-05-03 DIAGNOSIS — Z79899 Other long term (current) drug therapy: Secondary | ICD-10-CM | POA: Diagnosis not present

## 2017-05-03 DIAGNOSIS — N186 End stage renal disease: Secondary | ICD-10-CM | POA: Diagnosis not present

## 2017-05-04 DIAGNOSIS — D631 Anemia in chronic kidney disease: Secondary | ICD-10-CM | POA: Diagnosis not present

## 2017-05-04 DIAGNOSIS — Z79899 Other long term (current) drug therapy: Secondary | ICD-10-CM | POA: Diagnosis not present

## 2017-05-04 DIAGNOSIS — D509 Iron deficiency anemia, unspecified: Secondary | ICD-10-CM | POA: Diagnosis not present

## 2017-05-04 DIAGNOSIS — N186 End stage renal disease: Secondary | ICD-10-CM | POA: Diagnosis not present

## 2017-05-04 DIAGNOSIS — N2581 Secondary hyperparathyroidism of renal origin: Secondary | ICD-10-CM | POA: Diagnosis not present

## 2017-05-04 DIAGNOSIS — E440 Moderate protein-calorie malnutrition: Secondary | ICD-10-CM | POA: Diagnosis not present

## 2017-05-05 DIAGNOSIS — D509 Iron deficiency anemia, unspecified: Secondary | ICD-10-CM | POA: Diagnosis not present

## 2017-05-05 DIAGNOSIS — N2581 Secondary hyperparathyroidism of renal origin: Secondary | ICD-10-CM | POA: Diagnosis not present

## 2017-05-05 DIAGNOSIS — N186 End stage renal disease: Secondary | ICD-10-CM | POA: Diagnosis not present

## 2017-05-05 DIAGNOSIS — E440 Moderate protein-calorie malnutrition: Secondary | ICD-10-CM | POA: Diagnosis not present

## 2017-05-05 DIAGNOSIS — N2589 Other disorders resulting from impaired renal tubular function: Secondary | ICD-10-CM | POA: Diagnosis not present

## 2017-05-05 DIAGNOSIS — Z79899 Other long term (current) drug therapy: Secondary | ICD-10-CM | POA: Diagnosis not present

## 2017-05-05 DIAGNOSIS — D631 Anemia in chronic kidney disease: Secondary | ICD-10-CM | POA: Diagnosis not present

## 2017-05-06 DIAGNOSIS — N2581 Secondary hyperparathyroidism of renal origin: Secondary | ICD-10-CM | POA: Diagnosis not present

## 2017-05-06 DIAGNOSIS — D631 Anemia in chronic kidney disease: Secondary | ICD-10-CM | POA: Diagnosis not present

## 2017-05-06 DIAGNOSIS — Z79899 Other long term (current) drug therapy: Secondary | ICD-10-CM | POA: Diagnosis not present

## 2017-05-06 DIAGNOSIS — N2589 Other disorders resulting from impaired renal tubular function: Secondary | ICD-10-CM | POA: Diagnosis not present

## 2017-05-06 DIAGNOSIS — E440 Moderate protein-calorie malnutrition: Secondary | ICD-10-CM | POA: Diagnosis not present

## 2017-05-06 DIAGNOSIS — N186 End stage renal disease: Secondary | ICD-10-CM | POA: Diagnosis not present

## 2017-05-07 DIAGNOSIS — D631 Anemia in chronic kidney disease: Secondary | ICD-10-CM | POA: Diagnosis not present

## 2017-05-07 DIAGNOSIS — Z79899 Other long term (current) drug therapy: Secondary | ICD-10-CM | POA: Diagnosis not present

## 2017-05-07 DIAGNOSIS — N2581 Secondary hyperparathyroidism of renal origin: Secondary | ICD-10-CM | POA: Diagnosis not present

## 2017-05-07 DIAGNOSIS — N2589 Other disorders resulting from impaired renal tubular function: Secondary | ICD-10-CM | POA: Diagnosis not present

## 2017-05-07 DIAGNOSIS — E440 Moderate protein-calorie malnutrition: Secondary | ICD-10-CM | POA: Diagnosis not present

## 2017-05-07 DIAGNOSIS — N186 End stage renal disease: Secondary | ICD-10-CM | POA: Diagnosis not present

## 2017-05-08 DIAGNOSIS — D631 Anemia in chronic kidney disease: Secondary | ICD-10-CM | POA: Diagnosis not present

## 2017-05-08 DIAGNOSIS — E440 Moderate protein-calorie malnutrition: Secondary | ICD-10-CM | POA: Diagnosis not present

## 2017-05-08 DIAGNOSIS — R8299 Other abnormal findings in urine: Secondary | ICD-10-CM | POA: Diagnosis not present

## 2017-05-08 DIAGNOSIS — N186 End stage renal disease: Secondary | ICD-10-CM | POA: Diagnosis not present

## 2017-05-08 DIAGNOSIS — Z79899 Other long term (current) drug therapy: Secondary | ICD-10-CM | POA: Diagnosis not present

## 2017-05-08 DIAGNOSIS — N2581 Secondary hyperparathyroidism of renal origin: Secondary | ICD-10-CM | POA: Diagnosis not present

## 2017-05-08 DIAGNOSIS — N2589 Other disorders resulting from impaired renal tubular function: Secondary | ICD-10-CM | POA: Diagnosis not present

## 2017-05-09 DIAGNOSIS — E440 Moderate protein-calorie malnutrition: Secondary | ICD-10-CM | POA: Diagnosis not present

## 2017-05-09 DIAGNOSIS — N2589 Other disorders resulting from impaired renal tubular function: Secondary | ICD-10-CM | POA: Diagnosis not present

## 2017-05-09 DIAGNOSIS — D631 Anemia in chronic kidney disease: Secondary | ICD-10-CM | POA: Diagnosis not present

## 2017-05-09 DIAGNOSIS — Z79899 Other long term (current) drug therapy: Secondary | ICD-10-CM | POA: Diagnosis not present

## 2017-05-09 DIAGNOSIS — N186 End stage renal disease: Secondary | ICD-10-CM | POA: Diagnosis not present

## 2017-05-09 DIAGNOSIS — N2581 Secondary hyperparathyroidism of renal origin: Secondary | ICD-10-CM | POA: Diagnosis not present

## 2017-05-10 DIAGNOSIS — N2581 Secondary hyperparathyroidism of renal origin: Secondary | ICD-10-CM | POA: Diagnosis not present

## 2017-05-10 DIAGNOSIS — D631 Anemia in chronic kidney disease: Secondary | ICD-10-CM | POA: Diagnosis not present

## 2017-05-10 DIAGNOSIS — Z79899 Other long term (current) drug therapy: Secondary | ICD-10-CM | POA: Diagnosis not present

## 2017-05-10 DIAGNOSIS — N2589 Other disorders resulting from impaired renal tubular function: Secondary | ICD-10-CM | POA: Diagnosis not present

## 2017-05-10 DIAGNOSIS — E440 Moderate protein-calorie malnutrition: Secondary | ICD-10-CM | POA: Diagnosis not present

## 2017-05-10 DIAGNOSIS — N186 End stage renal disease: Secondary | ICD-10-CM | POA: Diagnosis not present

## 2017-05-11 DIAGNOSIS — N186 End stage renal disease: Secondary | ICD-10-CM | POA: Diagnosis not present

## 2017-05-11 DIAGNOSIS — E440 Moderate protein-calorie malnutrition: Secondary | ICD-10-CM | POA: Diagnosis not present

## 2017-05-11 DIAGNOSIS — D631 Anemia in chronic kidney disease: Secondary | ICD-10-CM | POA: Diagnosis not present

## 2017-05-11 DIAGNOSIS — N2589 Other disorders resulting from impaired renal tubular function: Secondary | ICD-10-CM | POA: Diagnosis not present

## 2017-05-11 DIAGNOSIS — N2581 Secondary hyperparathyroidism of renal origin: Secondary | ICD-10-CM | POA: Diagnosis not present

## 2017-05-11 DIAGNOSIS — Z79899 Other long term (current) drug therapy: Secondary | ICD-10-CM | POA: Diagnosis not present

## 2017-05-12 DIAGNOSIS — D631 Anemia in chronic kidney disease: Secondary | ICD-10-CM | POA: Diagnosis not present

## 2017-05-12 DIAGNOSIS — Z79899 Other long term (current) drug therapy: Secondary | ICD-10-CM | POA: Diagnosis not present

## 2017-05-12 DIAGNOSIS — N2581 Secondary hyperparathyroidism of renal origin: Secondary | ICD-10-CM | POA: Diagnosis not present

## 2017-05-12 DIAGNOSIS — E440 Moderate protein-calorie malnutrition: Secondary | ICD-10-CM | POA: Diagnosis not present

## 2017-05-12 DIAGNOSIS — N186 End stage renal disease: Secondary | ICD-10-CM | POA: Diagnosis not present

## 2017-05-12 DIAGNOSIS — N2589 Other disorders resulting from impaired renal tubular function: Secondary | ICD-10-CM | POA: Diagnosis not present

## 2017-05-13 DIAGNOSIS — E440 Moderate protein-calorie malnutrition: Secondary | ICD-10-CM | POA: Diagnosis not present

## 2017-05-13 DIAGNOSIS — N2581 Secondary hyperparathyroidism of renal origin: Secondary | ICD-10-CM | POA: Diagnosis not present

## 2017-05-13 DIAGNOSIS — N186 End stage renal disease: Secondary | ICD-10-CM | POA: Diagnosis not present

## 2017-05-13 DIAGNOSIS — D631 Anemia in chronic kidney disease: Secondary | ICD-10-CM | POA: Diagnosis not present

## 2017-05-13 DIAGNOSIS — N2589 Other disorders resulting from impaired renal tubular function: Secondary | ICD-10-CM | POA: Diagnosis not present

## 2017-05-13 DIAGNOSIS — Z79899 Other long term (current) drug therapy: Secondary | ICD-10-CM | POA: Diagnosis not present

## 2017-05-14 DIAGNOSIS — Z79899 Other long term (current) drug therapy: Secondary | ICD-10-CM | POA: Diagnosis not present

## 2017-05-14 DIAGNOSIS — D631 Anemia in chronic kidney disease: Secondary | ICD-10-CM | POA: Diagnosis not present

## 2017-05-14 DIAGNOSIS — N186 End stage renal disease: Secondary | ICD-10-CM | POA: Diagnosis not present

## 2017-05-14 DIAGNOSIS — E440 Moderate protein-calorie malnutrition: Secondary | ICD-10-CM | POA: Diagnosis not present

## 2017-05-14 DIAGNOSIS — N2581 Secondary hyperparathyroidism of renal origin: Secondary | ICD-10-CM | POA: Diagnosis not present

## 2017-05-14 DIAGNOSIS — N2589 Other disorders resulting from impaired renal tubular function: Secondary | ICD-10-CM | POA: Diagnosis not present

## 2017-05-15 DIAGNOSIS — N186 End stage renal disease: Secondary | ICD-10-CM | POA: Diagnosis not present

## 2017-05-15 DIAGNOSIS — N2581 Secondary hyperparathyroidism of renal origin: Secondary | ICD-10-CM | POA: Diagnosis not present

## 2017-05-15 DIAGNOSIS — Z79899 Other long term (current) drug therapy: Secondary | ICD-10-CM | POA: Diagnosis not present

## 2017-05-15 DIAGNOSIS — Z01818 Encounter for other preprocedural examination: Secondary | ICD-10-CM | POA: Diagnosis not present

## 2017-05-15 DIAGNOSIS — E440 Moderate protein-calorie malnutrition: Secondary | ICD-10-CM | POA: Diagnosis not present

## 2017-05-15 DIAGNOSIS — I10 Essential (primary) hypertension: Secondary | ICD-10-CM | POA: Diagnosis not present

## 2017-05-15 DIAGNOSIS — D631 Anemia in chronic kidney disease: Secondary | ICD-10-CM | POA: Diagnosis not present

## 2017-05-15 DIAGNOSIS — N2589 Other disorders resulting from impaired renal tubular function: Secondary | ICD-10-CM | POA: Diagnosis not present

## 2017-05-15 DIAGNOSIS — Z992 Dependence on renal dialysis: Secondary | ICD-10-CM | POA: Diagnosis not present

## 2017-05-15 DIAGNOSIS — N2889 Other specified disorders of kidney and ureter: Secondary | ICD-10-CM | POA: Diagnosis not present

## 2017-05-16 DIAGNOSIS — N186 End stage renal disease: Secondary | ICD-10-CM | POA: Diagnosis not present

## 2017-05-16 DIAGNOSIS — Z79899 Other long term (current) drug therapy: Secondary | ICD-10-CM | POA: Diagnosis not present

## 2017-05-16 DIAGNOSIS — N2581 Secondary hyperparathyroidism of renal origin: Secondary | ICD-10-CM | POA: Diagnosis not present

## 2017-05-16 DIAGNOSIS — D631 Anemia in chronic kidney disease: Secondary | ICD-10-CM | POA: Diagnosis not present

## 2017-05-16 DIAGNOSIS — N2589 Other disorders resulting from impaired renal tubular function: Secondary | ICD-10-CM | POA: Diagnosis not present

## 2017-05-16 DIAGNOSIS — E440 Moderate protein-calorie malnutrition: Secondary | ICD-10-CM | POA: Diagnosis not present

## 2017-05-17 DIAGNOSIS — N2581 Secondary hyperparathyroidism of renal origin: Secondary | ICD-10-CM | POA: Diagnosis not present

## 2017-05-17 DIAGNOSIS — N2589 Other disorders resulting from impaired renal tubular function: Secondary | ICD-10-CM | POA: Diagnosis not present

## 2017-05-17 DIAGNOSIS — N186 End stage renal disease: Secondary | ICD-10-CM | POA: Diagnosis not present

## 2017-05-17 DIAGNOSIS — Z79899 Other long term (current) drug therapy: Secondary | ICD-10-CM | POA: Diagnosis not present

## 2017-05-17 DIAGNOSIS — D631 Anemia in chronic kidney disease: Secondary | ICD-10-CM | POA: Diagnosis not present

## 2017-05-17 DIAGNOSIS — E440 Moderate protein-calorie malnutrition: Secondary | ICD-10-CM | POA: Diagnosis not present

## 2017-05-18 DIAGNOSIS — D631 Anemia in chronic kidney disease: Secondary | ICD-10-CM | POA: Diagnosis not present

## 2017-05-18 DIAGNOSIS — N2581 Secondary hyperparathyroidism of renal origin: Secondary | ICD-10-CM | POA: Diagnosis not present

## 2017-05-18 DIAGNOSIS — E440 Moderate protein-calorie malnutrition: Secondary | ICD-10-CM | POA: Diagnosis not present

## 2017-05-18 DIAGNOSIS — N186 End stage renal disease: Secondary | ICD-10-CM | POA: Diagnosis not present

## 2017-05-18 DIAGNOSIS — N2589 Other disorders resulting from impaired renal tubular function: Secondary | ICD-10-CM | POA: Diagnosis not present

## 2017-05-18 DIAGNOSIS — Z79899 Other long term (current) drug therapy: Secondary | ICD-10-CM | POA: Diagnosis not present

## 2017-05-19 DIAGNOSIS — N2581 Secondary hyperparathyroidism of renal origin: Secondary | ICD-10-CM | POA: Diagnosis not present

## 2017-05-19 DIAGNOSIS — Z79899 Other long term (current) drug therapy: Secondary | ICD-10-CM | POA: Diagnosis not present

## 2017-05-19 DIAGNOSIS — E440 Moderate protein-calorie malnutrition: Secondary | ICD-10-CM | POA: Diagnosis not present

## 2017-05-19 DIAGNOSIS — N2589 Other disorders resulting from impaired renal tubular function: Secondary | ICD-10-CM | POA: Diagnosis not present

## 2017-05-19 DIAGNOSIS — N186 End stage renal disease: Secondary | ICD-10-CM | POA: Diagnosis not present

## 2017-05-19 DIAGNOSIS — D631 Anemia in chronic kidney disease: Secondary | ICD-10-CM | POA: Diagnosis not present

## 2017-05-20 DIAGNOSIS — N186 End stage renal disease: Secondary | ICD-10-CM | POA: Diagnosis not present

## 2017-05-20 DIAGNOSIS — N2589 Other disorders resulting from impaired renal tubular function: Secondary | ICD-10-CM | POA: Diagnosis not present

## 2017-05-20 DIAGNOSIS — N2581 Secondary hyperparathyroidism of renal origin: Secondary | ICD-10-CM | POA: Diagnosis not present

## 2017-05-20 DIAGNOSIS — Z79899 Other long term (current) drug therapy: Secondary | ICD-10-CM | POA: Diagnosis not present

## 2017-05-20 DIAGNOSIS — D631 Anemia in chronic kidney disease: Secondary | ICD-10-CM | POA: Diagnosis not present

## 2017-05-20 DIAGNOSIS — E440 Moderate protein-calorie malnutrition: Secondary | ICD-10-CM | POA: Diagnosis not present

## 2017-05-21 DIAGNOSIS — N2589 Other disorders resulting from impaired renal tubular function: Secondary | ICD-10-CM | POA: Diagnosis not present

## 2017-05-21 DIAGNOSIS — N186 End stage renal disease: Secondary | ICD-10-CM | POA: Diagnosis not present

## 2017-05-21 DIAGNOSIS — N2581 Secondary hyperparathyroidism of renal origin: Secondary | ICD-10-CM | POA: Diagnosis not present

## 2017-05-21 DIAGNOSIS — E440 Moderate protein-calorie malnutrition: Secondary | ICD-10-CM | POA: Diagnosis not present

## 2017-05-21 DIAGNOSIS — D631 Anemia in chronic kidney disease: Secondary | ICD-10-CM | POA: Diagnosis not present

## 2017-05-21 DIAGNOSIS — Z79899 Other long term (current) drug therapy: Secondary | ICD-10-CM | POA: Diagnosis not present

## 2017-05-22 DIAGNOSIS — N186 End stage renal disease: Secondary | ICD-10-CM | POA: Diagnosis not present

## 2017-05-22 DIAGNOSIS — N2589 Other disorders resulting from impaired renal tubular function: Secondary | ICD-10-CM | POA: Diagnosis not present

## 2017-05-22 DIAGNOSIS — E440 Moderate protein-calorie malnutrition: Secondary | ICD-10-CM | POA: Diagnosis not present

## 2017-05-22 DIAGNOSIS — D631 Anemia in chronic kidney disease: Secondary | ICD-10-CM | POA: Diagnosis not present

## 2017-05-22 DIAGNOSIS — Z79899 Other long term (current) drug therapy: Secondary | ICD-10-CM | POA: Diagnosis not present

## 2017-05-22 DIAGNOSIS — N2581 Secondary hyperparathyroidism of renal origin: Secondary | ICD-10-CM | POA: Diagnosis not present

## 2017-05-23 DIAGNOSIS — Z79899 Other long term (current) drug therapy: Secondary | ICD-10-CM | POA: Diagnosis not present

## 2017-05-23 DIAGNOSIS — E440 Moderate protein-calorie malnutrition: Secondary | ICD-10-CM | POA: Diagnosis not present

## 2017-05-23 DIAGNOSIS — N186 End stage renal disease: Secondary | ICD-10-CM | POA: Diagnosis not present

## 2017-05-23 DIAGNOSIS — N2581 Secondary hyperparathyroidism of renal origin: Secondary | ICD-10-CM | POA: Diagnosis not present

## 2017-05-23 DIAGNOSIS — D631 Anemia in chronic kidney disease: Secondary | ICD-10-CM | POA: Diagnosis not present

## 2017-05-23 DIAGNOSIS — N2589 Other disorders resulting from impaired renal tubular function: Secondary | ICD-10-CM | POA: Diagnosis not present

## 2017-05-24 DIAGNOSIS — N186 End stage renal disease: Secondary | ICD-10-CM | POA: Diagnosis not present

## 2017-05-24 DIAGNOSIS — Z79899 Other long term (current) drug therapy: Secondary | ICD-10-CM | POA: Diagnosis not present

## 2017-05-24 DIAGNOSIS — D631 Anemia in chronic kidney disease: Secondary | ICD-10-CM | POA: Diagnosis not present

## 2017-05-24 DIAGNOSIS — N2581 Secondary hyperparathyroidism of renal origin: Secondary | ICD-10-CM | POA: Diagnosis not present

## 2017-05-24 DIAGNOSIS — E440 Moderate protein-calorie malnutrition: Secondary | ICD-10-CM | POA: Diagnosis not present

## 2017-05-24 DIAGNOSIS — N2589 Other disorders resulting from impaired renal tubular function: Secondary | ICD-10-CM | POA: Diagnosis not present

## 2017-05-25 DIAGNOSIS — D631 Anemia in chronic kidney disease: Secondary | ICD-10-CM | POA: Diagnosis not present

## 2017-05-25 DIAGNOSIS — E440 Moderate protein-calorie malnutrition: Secondary | ICD-10-CM | POA: Diagnosis not present

## 2017-05-25 DIAGNOSIS — N2581 Secondary hyperparathyroidism of renal origin: Secondary | ICD-10-CM | POA: Diagnosis not present

## 2017-05-25 DIAGNOSIS — Z79899 Other long term (current) drug therapy: Secondary | ICD-10-CM | POA: Diagnosis not present

## 2017-05-25 DIAGNOSIS — N2589 Other disorders resulting from impaired renal tubular function: Secondary | ICD-10-CM | POA: Diagnosis not present

## 2017-05-25 DIAGNOSIS — N186 End stage renal disease: Secondary | ICD-10-CM | POA: Diagnosis not present

## 2017-05-26 DIAGNOSIS — Z79899 Other long term (current) drug therapy: Secondary | ICD-10-CM | POA: Diagnosis not present

## 2017-05-26 DIAGNOSIS — D631 Anemia in chronic kidney disease: Secondary | ICD-10-CM | POA: Diagnosis not present

## 2017-05-26 DIAGNOSIS — E440 Moderate protein-calorie malnutrition: Secondary | ICD-10-CM | POA: Diagnosis not present

## 2017-05-26 DIAGNOSIS — N2581 Secondary hyperparathyroidism of renal origin: Secondary | ICD-10-CM | POA: Diagnosis not present

## 2017-05-26 DIAGNOSIS — N2589 Other disorders resulting from impaired renal tubular function: Secondary | ICD-10-CM | POA: Diagnosis not present

## 2017-05-26 DIAGNOSIS — N186 End stage renal disease: Secondary | ICD-10-CM | POA: Diagnosis not present

## 2017-05-27 DIAGNOSIS — N2581 Secondary hyperparathyroidism of renal origin: Secondary | ICD-10-CM | POA: Diagnosis not present

## 2017-05-27 DIAGNOSIS — N2589 Other disorders resulting from impaired renal tubular function: Secondary | ICD-10-CM | POA: Diagnosis not present

## 2017-05-27 DIAGNOSIS — Z79899 Other long term (current) drug therapy: Secondary | ICD-10-CM | POA: Diagnosis not present

## 2017-05-27 DIAGNOSIS — E440 Moderate protein-calorie malnutrition: Secondary | ICD-10-CM | POA: Diagnosis not present

## 2017-05-27 DIAGNOSIS — N186 End stage renal disease: Secondary | ICD-10-CM | POA: Diagnosis not present

## 2017-05-27 DIAGNOSIS — D631 Anemia in chronic kidney disease: Secondary | ICD-10-CM | POA: Diagnosis not present

## 2017-05-28 DIAGNOSIS — N2581 Secondary hyperparathyroidism of renal origin: Secondary | ICD-10-CM | POA: Diagnosis not present

## 2017-05-28 DIAGNOSIS — E440 Moderate protein-calorie malnutrition: Secondary | ICD-10-CM | POA: Diagnosis not present

## 2017-05-28 DIAGNOSIS — N2589 Other disorders resulting from impaired renal tubular function: Secondary | ICD-10-CM | POA: Diagnosis not present

## 2017-05-28 DIAGNOSIS — N186 End stage renal disease: Secondary | ICD-10-CM | POA: Diagnosis not present

## 2017-05-28 DIAGNOSIS — D631 Anemia in chronic kidney disease: Secondary | ICD-10-CM | POA: Diagnosis not present

## 2017-05-28 DIAGNOSIS — Z79899 Other long term (current) drug therapy: Secondary | ICD-10-CM | POA: Diagnosis not present

## 2017-05-29 DIAGNOSIS — E440 Moderate protein-calorie malnutrition: Secondary | ICD-10-CM | POA: Diagnosis not present

## 2017-05-29 DIAGNOSIS — Z79899 Other long term (current) drug therapy: Secondary | ICD-10-CM | POA: Diagnosis not present

## 2017-05-29 DIAGNOSIS — Z0000 Encounter for general adult medical examination without abnormal findings: Secondary | ICD-10-CM | POA: Diagnosis not present

## 2017-05-29 DIAGNOSIS — N2581 Secondary hyperparathyroidism of renal origin: Secondary | ICD-10-CM | POA: Diagnosis not present

## 2017-05-29 DIAGNOSIS — I120 Hypertensive chronic kidney disease with stage 5 chronic kidney disease or end stage renal disease: Secondary | ICD-10-CM | POA: Diagnosis not present

## 2017-05-29 DIAGNOSIS — N186 End stage renal disease: Secondary | ICD-10-CM | POA: Diagnosis not present

## 2017-05-29 DIAGNOSIS — R7309 Other abnormal glucose: Secondary | ICD-10-CM | POA: Diagnosis not present

## 2017-05-29 DIAGNOSIS — Z992 Dependence on renal dialysis: Secondary | ICD-10-CM | POA: Diagnosis not present

## 2017-05-29 DIAGNOSIS — E039 Hypothyroidism, unspecified: Secondary | ICD-10-CM | POA: Diagnosis not present

## 2017-05-29 DIAGNOSIS — D631 Anemia in chronic kidney disease: Secondary | ICD-10-CM | POA: Diagnosis not present

## 2017-05-29 DIAGNOSIS — H6122 Impacted cerumen, left ear: Secondary | ICD-10-CM | POA: Diagnosis not present

## 2017-05-29 DIAGNOSIS — N2589 Other disorders resulting from impaired renal tubular function: Secondary | ICD-10-CM | POA: Diagnosis not present

## 2017-05-29 DIAGNOSIS — E559 Vitamin D deficiency, unspecified: Secondary | ICD-10-CM | POA: Diagnosis not present

## 2017-05-29 LAB — TSH: TSH: 14.52 — AB (ref 0.41–5.90)

## 2017-05-29 LAB — CBC AND DIFFERENTIAL
HCT: 31 — AB (ref 36–46)
Hemoglobin: 9.9 — AB (ref 12.0–16.0)
Platelets: 404 — AB (ref 150–399)
WBC: 12.1

## 2017-05-29 LAB — BASIC METABOLIC PANEL
BUN: 58 — AB (ref 4–21)
Creatinine: 13 — AB (ref 0.5–1.1)
Glucose: 86
Potassium: 4.7 (ref 3.4–5.3)
Sodium: 141 (ref 137–147)

## 2017-05-29 LAB — HEPATIC FUNCTION PANEL
ALT: 7 (ref 7–35)
AST: 11 — AB (ref 13–35)
Alkaline Phosphatase: 73 (ref 25–125)
Bilirubin, Total: 0.3

## 2017-05-29 LAB — LIPID PANEL
Cholesterol: 234 — AB (ref 0–200)
HDL: 45 (ref 35–70)
LDL Cholesterol: 166
LDl/HDL Ratio: 3.7
Triglycerides: 114 (ref 40–160)

## 2017-05-29 LAB — VITAMIN D 25 HYDROXY (VIT D DEFICIENCY, FRACTURES): Vit D, 25-Hydroxy: 35.1

## 2017-05-29 LAB — HEMOGLOBIN A1C: Hemoglobin A1C: 5.5

## 2017-05-30 DIAGNOSIS — D631 Anemia in chronic kidney disease: Secondary | ICD-10-CM | POA: Diagnosis not present

## 2017-05-30 DIAGNOSIS — N2581 Secondary hyperparathyroidism of renal origin: Secondary | ICD-10-CM | POA: Diagnosis not present

## 2017-05-30 DIAGNOSIS — N186 End stage renal disease: Secondary | ICD-10-CM | POA: Diagnosis not present

## 2017-05-30 DIAGNOSIS — N2589 Other disorders resulting from impaired renal tubular function: Secondary | ICD-10-CM | POA: Diagnosis not present

## 2017-05-30 DIAGNOSIS — E440 Moderate protein-calorie malnutrition: Secondary | ICD-10-CM | POA: Diagnosis not present

## 2017-05-30 DIAGNOSIS — Z79899 Other long term (current) drug therapy: Secondary | ICD-10-CM | POA: Diagnosis not present

## 2017-05-31 DIAGNOSIS — N2589 Other disorders resulting from impaired renal tubular function: Secondary | ICD-10-CM | POA: Diagnosis not present

## 2017-05-31 DIAGNOSIS — N186 End stage renal disease: Secondary | ICD-10-CM | POA: Diagnosis not present

## 2017-05-31 DIAGNOSIS — N2581 Secondary hyperparathyroidism of renal origin: Secondary | ICD-10-CM | POA: Diagnosis not present

## 2017-05-31 DIAGNOSIS — D631 Anemia in chronic kidney disease: Secondary | ICD-10-CM | POA: Diagnosis not present

## 2017-05-31 DIAGNOSIS — E440 Moderate protein-calorie malnutrition: Secondary | ICD-10-CM | POA: Diagnosis not present

## 2017-05-31 DIAGNOSIS — Z79899 Other long term (current) drug therapy: Secondary | ICD-10-CM | POA: Diagnosis not present

## 2017-06-01 DIAGNOSIS — D631 Anemia in chronic kidney disease: Secondary | ICD-10-CM | POA: Diagnosis not present

## 2017-06-01 DIAGNOSIS — E440 Moderate protein-calorie malnutrition: Secondary | ICD-10-CM | POA: Diagnosis not present

## 2017-06-01 DIAGNOSIS — N2589 Other disorders resulting from impaired renal tubular function: Secondary | ICD-10-CM | POA: Diagnosis not present

## 2017-06-01 DIAGNOSIS — Z79899 Other long term (current) drug therapy: Secondary | ICD-10-CM | POA: Diagnosis not present

## 2017-06-01 DIAGNOSIS — N186 End stage renal disease: Secondary | ICD-10-CM | POA: Diagnosis not present

## 2017-06-01 DIAGNOSIS — N2581 Secondary hyperparathyroidism of renal origin: Secondary | ICD-10-CM | POA: Diagnosis not present

## 2017-06-02 DIAGNOSIS — Z79899 Other long term (current) drug therapy: Secondary | ICD-10-CM | POA: Diagnosis not present

## 2017-06-02 DIAGNOSIS — N2589 Other disorders resulting from impaired renal tubular function: Secondary | ICD-10-CM | POA: Diagnosis not present

## 2017-06-02 DIAGNOSIS — D631 Anemia in chronic kidney disease: Secondary | ICD-10-CM | POA: Diagnosis not present

## 2017-06-02 DIAGNOSIS — E440 Moderate protein-calorie malnutrition: Secondary | ICD-10-CM | POA: Diagnosis not present

## 2017-06-02 DIAGNOSIS — N2581 Secondary hyperparathyroidism of renal origin: Secondary | ICD-10-CM | POA: Diagnosis not present

## 2017-06-02 DIAGNOSIS — N186 End stage renal disease: Secondary | ICD-10-CM | POA: Diagnosis not present

## 2017-06-03 DIAGNOSIS — N2581 Secondary hyperparathyroidism of renal origin: Secondary | ICD-10-CM | POA: Diagnosis not present

## 2017-06-03 DIAGNOSIS — N2589 Other disorders resulting from impaired renal tubular function: Secondary | ICD-10-CM | POA: Diagnosis not present

## 2017-06-03 DIAGNOSIS — Z79899 Other long term (current) drug therapy: Secondary | ICD-10-CM | POA: Diagnosis not present

## 2017-06-03 DIAGNOSIS — N186 End stage renal disease: Secondary | ICD-10-CM | POA: Diagnosis not present

## 2017-06-03 DIAGNOSIS — D631 Anemia in chronic kidney disease: Secondary | ICD-10-CM | POA: Diagnosis not present

## 2017-06-03 DIAGNOSIS — E440 Moderate protein-calorie malnutrition: Secondary | ICD-10-CM | POA: Diagnosis not present

## 2017-06-04 DIAGNOSIS — Z4932 Encounter for adequacy testing for peritoneal dialysis: Secondary | ICD-10-CM | POA: Diagnosis not present

## 2017-06-04 DIAGNOSIS — D631 Anemia in chronic kidney disease: Secondary | ICD-10-CM | POA: Diagnosis not present

## 2017-06-04 DIAGNOSIS — N2581 Secondary hyperparathyroidism of renal origin: Secondary | ICD-10-CM | POA: Diagnosis not present

## 2017-06-04 DIAGNOSIS — D509 Iron deficiency anemia, unspecified: Secondary | ICD-10-CM | POA: Diagnosis not present

## 2017-06-04 DIAGNOSIS — N186 End stage renal disease: Secondary | ICD-10-CM | POA: Diagnosis not present

## 2017-06-05 DIAGNOSIS — D509 Iron deficiency anemia, unspecified: Secondary | ICD-10-CM | POA: Diagnosis not present

## 2017-06-05 DIAGNOSIS — D631 Anemia in chronic kidney disease: Secondary | ICD-10-CM | POA: Diagnosis not present

## 2017-06-05 DIAGNOSIS — Z4932 Encounter for adequacy testing for peritoneal dialysis: Secondary | ICD-10-CM | POA: Diagnosis not present

## 2017-06-05 DIAGNOSIS — N186 End stage renal disease: Secondary | ICD-10-CM | POA: Diagnosis not present

## 2017-06-05 DIAGNOSIS — N2581 Secondary hyperparathyroidism of renal origin: Secondary | ICD-10-CM | POA: Diagnosis not present

## 2017-06-06 DIAGNOSIS — E7849 Other hyperlipidemia: Secondary | ICD-10-CM | POA: Diagnosis not present

## 2017-06-06 DIAGNOSIS — N186 End stage renal disease: Secondary | ICD-10-CM | POA: Diagnosis not present

## 2017-06-06 DIAGNOSIS — D631 Anemia in chronic kidney disease: Secondary | ICD-10-CM | POA: Diagnosis not present

## 2017-06-06 DIAGNOSIS — D509 Iron deficiency anemia, unspecified: Secondary | ICD-10-CM | POA: Diagnosis not present

## 2017-06-06 DIAGNOSIS — N2581 Secondary hyperparathyroidism of renal origin: Secondary | ICD-10-CM | POA: Diagnosis not present

## 2017-06-06 DIAGNOSIS — Z4932 Encounter for adequacy testing for peritoneal dialysis: Secondary | ICD-10-CM | POA: Diagnosis not present

## 2017-06-07 DIAGNOSIS — D631 Anemia in chronic kidney disease: Secondary | ICD-10-CM | POA: Diagnosis not present

## 2017-06-07 DIAGNOSIS — N2581 Secondary hyperparathyroidism of renal origin: Secondary | ICD-10-CM | POA: Diagnosis not present

## 2017-06-07 DIAGNOSIS — D509 Iron deficiency anemia, unspecified: Secondary | ICD-10-CM | POA: Diagnosis not present

## 2017-06-07 DIAGNOSIS — N186 End stage renal disease: Secondary | ICD-10-CM | POA: Diagnosis not present

## 2017-06-07 DIAGNOSIS — Z4932 Encounter for adequacy testing for peritoneal dialysis: Secondary | ICD-10-CM | POA: Diagnosis not present

## 2017-06-08 DIAGNOSIS — D509 Iron deficiency anemia, unspecified: Secondary | ICD-10-CM | POA: Diagnosis not present

## 2017-06-08 DIAGNOSIS — D631 Anemia in chronic kidney disease: Secondary | ICD-10-CM | POA: Diagnosis not present

## 2017-06-08 DIAGNOSIS — N2581 Secondary hyperparathyroidism of renal origin: Secondary | ICD-10-CM | POA: Diagnosis not present

## 2017-06-08 DIAGNOSIS — Z4932 Encounter for adequacy testing for peritoneal dialysis: Secondary | ICD-10-CM | POA: Diagnosis not present

## 2017-06-08 DIAGNOSIS — N186 End stage renal disease: Secondary | ICD-10-CM | POA: Diagnosis not present

## 2017-06-09 DIAGNOSIS — N2581 Secondary hyperparathyroidism of renal origin: Secondary | ICD-10-CM | POA: Diagnosis not present

## 2017-06-09 DIAGNOSIS — D631 Anemia in chronic kidney disease: Secondary | ICD-10-CM | POA: Diagnosis not present

## 2017-06-09 DIAGNOSIS — N186 End stage renal disease: Secondary | ICD-10-CM | POA: Diagnosis not present

## 2017-06-09 DIAGNOSIS — Z4932 Encounter for adequacy testing for peritoneal dialysis: Secondary | ICD-10-CM | POA: Diagnosis not present

## 2017-06-09 DIAGNOSIS — D509 Iron deficiency anemia, unspecified: Secondary | ICD-10-CM | POA: Diagnosis not present

## 2017-06-10 DIAGNOSIS — D631 Anemia in chronic kidney disease: Secondary | ICD-10-CM | POA: Diagnosis not present

## 2017-06-10 DIAGNOSIS — N2581 Secondary hyperparathyroidism of renal origin: Secondary | ICD-10-CM | POA: Diagnosis not present

## 2017-06-10 DIAGNOSIS — N186 End stage renal disease: Secondary | ICD-10-CM | POA: Diagnosis not present

## 2017-06-10 DIAGNOSIS — D509 Iron deficiency anemia, unspecified: Secondary | ICD-10-CM | POA: Diagnosis not present

## 2017-06-10 DIAGNOSIS — Z4932 Encounter for adequacy testing for peritoneal dialysis: Secondary | ICD-10-CM | POA: Diagnosis not present

## 2017-06-11 DIAGNOSIS — D631 Anemia in chronic kidney disease: Secondary | ICD-10-CM | POA: Diagnosis not present

## 2017-06-11 DIAGNOSIS — N2581 Secondary hyperparathyroidism of renal origin: Secondary | ICD-10-CM | POA: Diagnosis not present

## 2017-06-11 DIAGNOSIS — Z4932 Encounter for adequacy testing for peritoneal dialysis: Secondary | ICD-10-CM | POA: Diagnosis not present

## 2017-06-11 DIAGNOSIS — N186 End stage renal disease: Secondary | ICD-10-CM | POA: Diagnosis not present

## 2017-06-11 DIAGNOSIS — D509 Iron deficiency anemia, unspecified: Secondary | ICD-10-CM | POA: Diagnosis not present

## 2017-06-12 DIAGNOSIS — D631 Anemia in chronic kidney disease: Secondary | ICD-10-CM | POA: Diagnosis not present

## 2017-06-12 DIAGNOSIS — Z4932 Encounter for adequacy testing for peritoneal dialysis: Secondary | ICD-10-CM | POA: Diagnosis not present

## 2017-06-12 DIAGNOSIS — N186 End stage renal disease: Secondary | ICD-10-CM | POA: Diagnosis not present

## 2017-06-12 DIAGNOSIS — D509 Iron deficiency anemia, unspecified: Secondary | ICD-10-CM | POA: Diagnosis not present

## 2017-06-12 DIAGNOSIS — N2581 Secondary hyperparathyroidism of renal origin: Secondary | ICD-10-CM | POA: Diagnosis not present

## 2017-06-13 DIAGNOSIS — D631 Anemia in chronic kidney disease: Secondary | ICD-10-CM | POA: Diagnosis not present

## 2017-06-13 DIAGNOSIS — N2581 Secondary hyperparathyroidism of renal origin: Secondary | ICD-10-CM | POA: Diagnosis not present

## 2017-06-13 DIAGNOSIS — N186 End stage renal disease: Secondary | ICD-10-CM | POA: Diagnosis not present

## 2017-06-13 DIAGNOSIS — D509 Iron deficiency anemia, unspecified: Secondary | ICD-10-CM | POA: Diagnosis not present

## 2017-06-13 DIAGNOSIS — Z4932 Encounter for adequacy testing for peritoneal dialysis: Secondary | ICD-10-CM | POA: Diagnosis not present

## 2017-06-14 DIAGNOSIS — N2581 Secondary hyperparathyroidism of renal origin: Secondary | ICD-10-CM | POA: Diagnosis not present

## 2017-06-14 DIAGNOSIS — D631 Anemia in chronic kidney disease: Secondary | ICD-10-CM | POA: Diagnosis not present

## 2017-06-14 DIAGNOSIS — Z4932 Encounter for adequacy testing for peritoneal dialysis: Secondary | ICD-10-CM | POA: Diagnosis not present

## 2017-06-14 DIAGNOSIS — D509 Iron deficiency anemia, unspecified: Secondary | ICD-10-CM | POA: Diagnosis not present

## 2017-06-14 DIAGNOSIS — N186 End stage renal disease: Secondary | ICD-10-CM | POA: Diagnosis not present

## 2017-06-15 DIAGNOSIS — N2581 Secondary hyperparathyroidism of renal origin: Secondary | ICD-10-CM | POA: Diagnosis not present

## 2017-06-15 DIAGNOSIS — Z4932 Encounter for adequacy testing for peritoneal dialysis: Secondary | ICD-10-CM | POA: Diagnosis not present

## 2017-06-15 DIAGNOSIS — N186 End stage renal disease: Secondary | ICD-10-CM | POA: Diagnosis not present

## 2017-06-15 DIAGNOSIS — D509 Iron deficiency anemia, unspecified: Secondary | ICD-10-CM | POA: Diagnosis not present

## 2017-06-15 DIAGNOSIS — D631 Anemia in chronic kidney disease: Secondary | ICD-10-CM | POA: Diagnosis not present

## 2017-06-16 DIAGNOSIS — D631 Anemia in chronic kidney disease: Secondary | ICD-10-CM | POA: Diagnosis not present

## 2017-06-16 DIAGNOSIS — N186 End stage renal disease: Secondary | ICD-10-CM | POA: Diagnosis not present

## 2017-06-16 DIAGNOSIS — D509 Iron deficiency anemia, unspecified: Secondary | ICD-10-CM | POA: Diagnosis not present

## 2017-06-16 DIAGNOSIS — Z4932 Encounter for adequacy testing for peritoneal dialysis: Secondary | ICD-10-CM | POA: Diagnosis not present

## 2017-06-16 DIAGNOSIS — N2581 Secondary hyperparathyroidism of renal origin: Secondary | ICD-10-CM | POA: Diagnosis not present

## 2017-06-17 DIAGNOSIS — D509 Iron deficiency anemia, unspecified: Secondary | ICD-10-CM | POA: Diagnosis not present

## 2017-06-17 DIAGNOSIS — N2581 Secondary hyperparathyroidism of renal origin: Secondary | ICD-10-CM | POA: Diagnosis not present

## 2017-06-17 DIAGNOSIS — D631 Anemia in chronic kidney disease: Secondary | ICD-10-CM | POA: Diagnosis not present

## 2017-06-17 DIAGNOSIS — Z4932 Encounter for adequacy testing for peritoneal dialysis: Secondary | ICD-10-CM | POA: Diagnosis not present

## 2017-06-17 DIAGNOSIS — N186 End stage renal disease: Secondary | ICD-10-CM | POA: Diagnosis not present

## 2017-06-18 DIAGNOSIS — N186 End stage renal disease: Secondary | ICD-10-CM | POA: Diagnosis not present

## 2017-06-18 DIAGNOSIS — Z4932 Encounter for adequacy testing for peritoneal dialysis: Secondary | ICD-10-CM | POA: Diagnosis not present

## 2017-06-18 DIAGNOSIS — N2581 Secondary hyperparathyroidism of renal origin: Secondary | ICD-10-CM | POA: Diagnosis not present

## 2017-06-18 DIAGNOSIS — D509 Iron deficiency anemia, unspecified: Secondary | ICD-10-CM | POA: Diagnosis not present

## 2017-06-18 DIAGNOSIS — D631 Anemia in chronic kidney disease: Secondary | ICD-10-CM | POA: Diagnosis not present

## 2017-06-18 IMAGING — MR MR ABDOMEN W/O CM
4 of 8 series · 22 of 48 positions shown · non-contrast
Comparison: No prior abdominal MRI. CT of the abdomen and pelvis
07/04/2016.

CLINICAL DATA: 59-year-old female with known or renal failure on
hemodialysis. Possible neoplasm of the left kidney.

EXAM:
MRI ABDOMEN WITHOUT CONTRAST
TECHNIQUE: Multiplanar multisequence MR imaging was performed without the
administration of intravenous contrast.

[Series 3: DWI b500 · axial · 6.0mm · 1.48mm/px · z∈[-148,+140]mm · 8 of 74 slices shown]
[im 1/74]
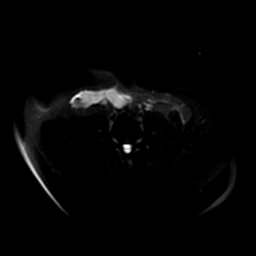
[im 11/74]
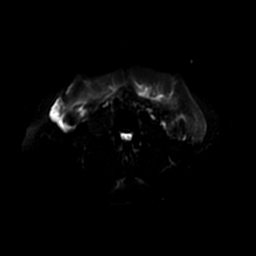
[im 21/74]
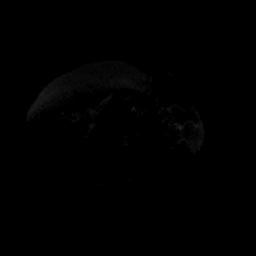
[im 32/74]
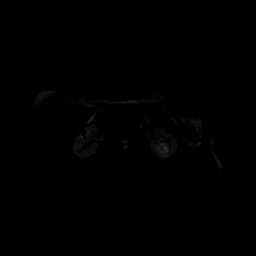
[im 42/74]
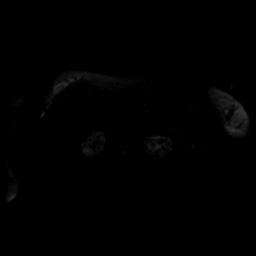
[im 53/74]
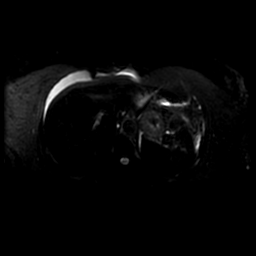
[im 63/74]
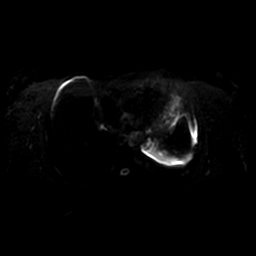
[im 74/74]
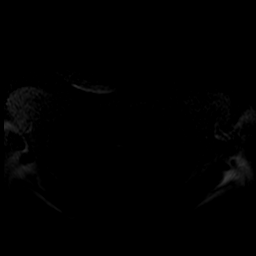

[Series 4: T2 fat-sat · axial · 5.0mm · 0.70mm/px · z∈[-104,+131]mm · 5 of 48 slices shown]
[im 1/48]
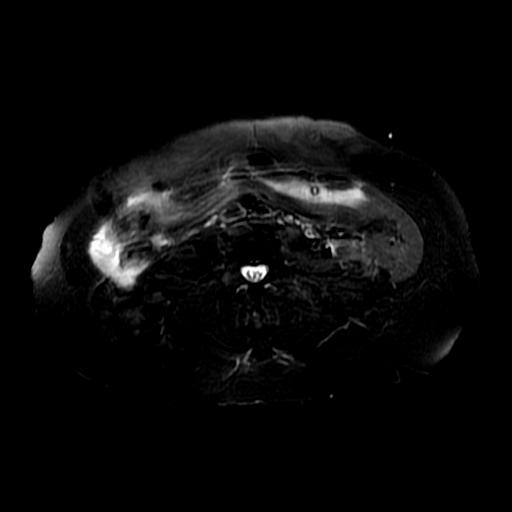
[im 12/48]
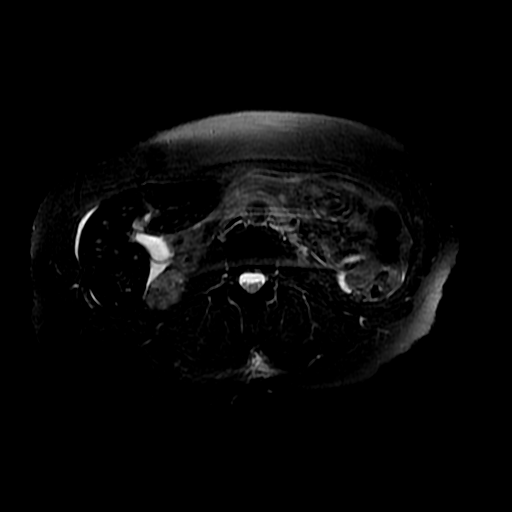
[im 24/48]
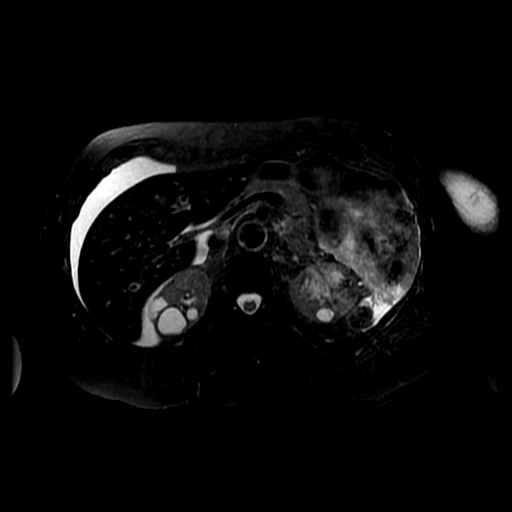
[im 36/48]
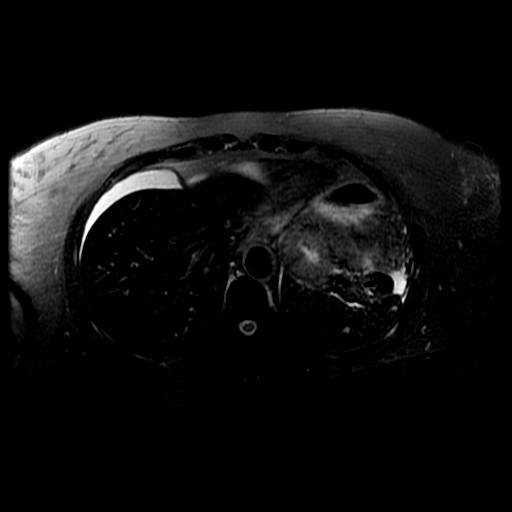
[im 48/48]
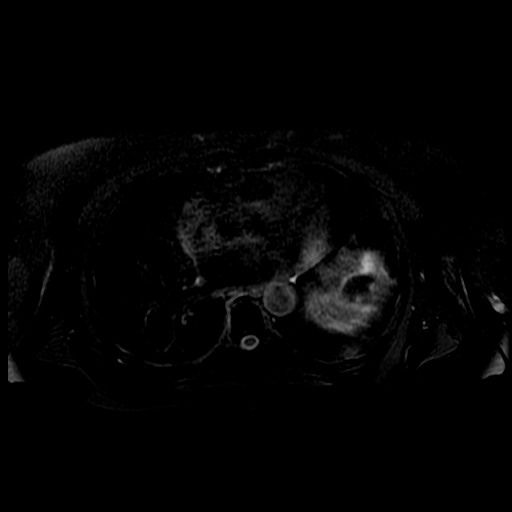

[Series 5: ax dualecho · axial · 5.0mm · 0.70mm/px · z∈[-104,+106]mm · 6 of 96 slices shown]
[im 1/96]
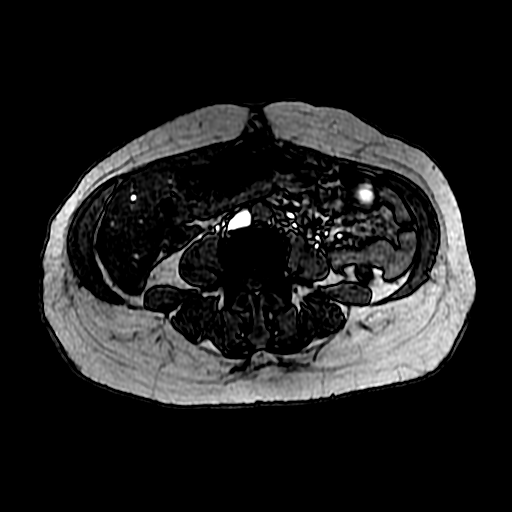
[im 11/96]
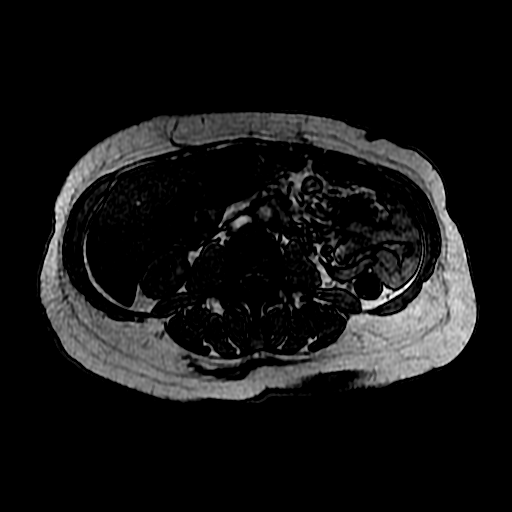
[im 32/96]
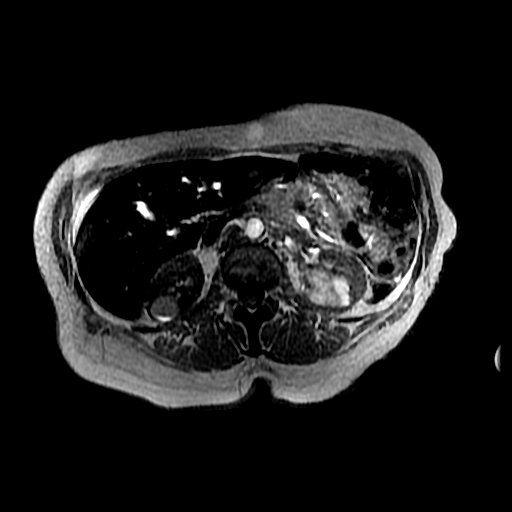
[im 43/96]
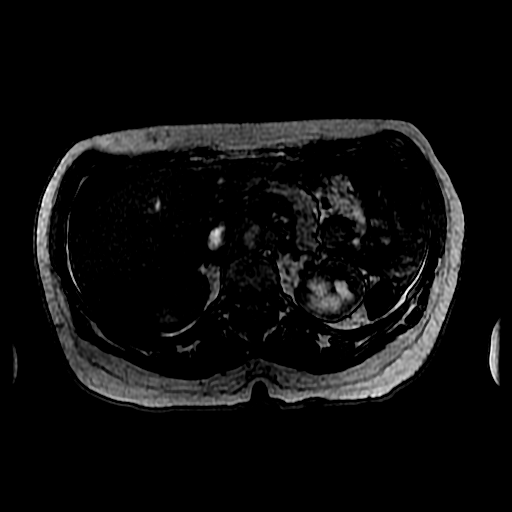
[im 53/96]
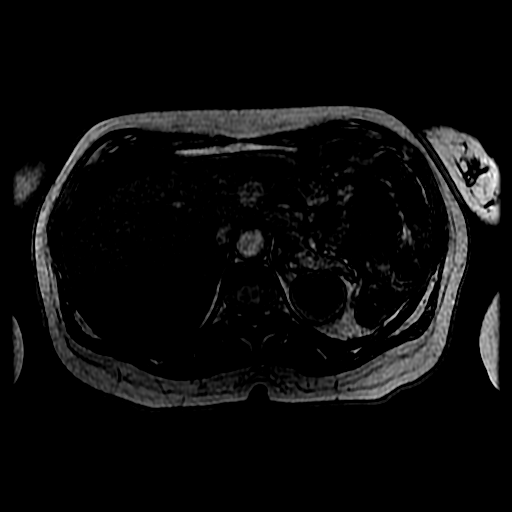
[im 85/96]
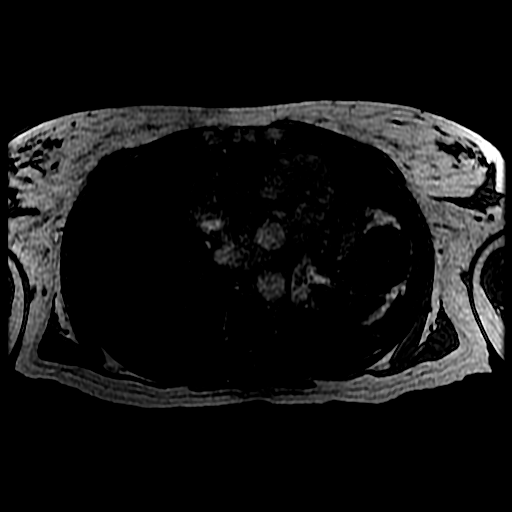

[Series 6: T2 · axial · 5.0mm · 0.70mm/px · z∈[-104,+131]mm · 3 of 48 slices shown]
[im 1/48]
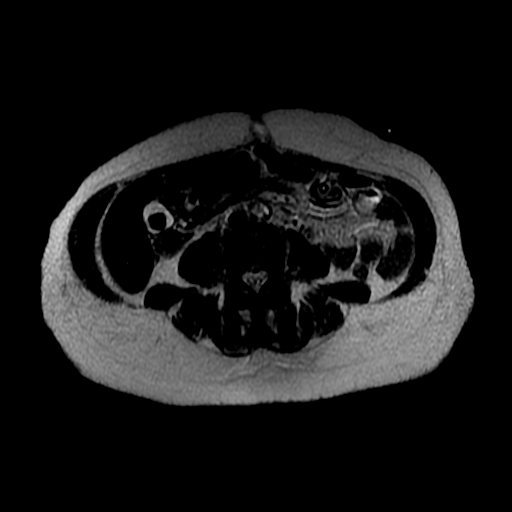
[im 24/48]
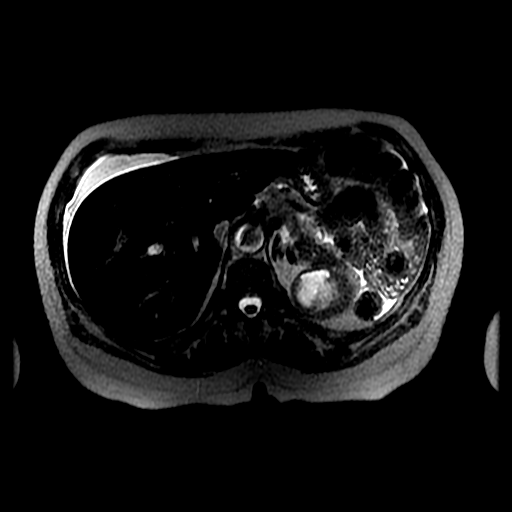
[im 48/48]
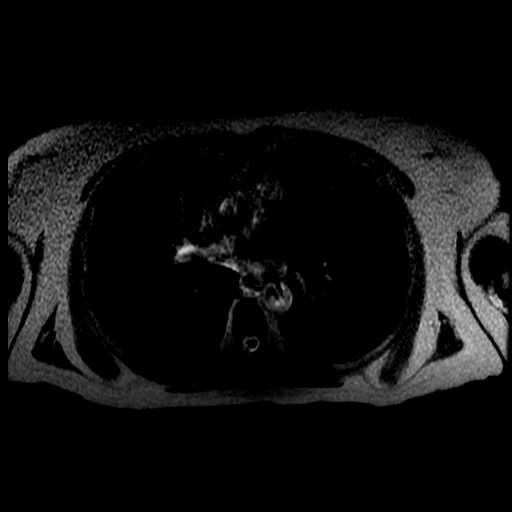

[22 of 48 positions shown; findings below may reference images not displayed]

FINDINGS: Comment: Study is limited for detection of visceral and/or vascular
lesions by lack of IV gadolinium.

Lower chest: Elevation of the left hemidiaphragm.

Hepatobiliary: Liver is enlarged measuring 21 cm in craniocaudal
span. Diffuse loss of signal intensity throughout the hepatic
parenchyma on in phase dual echo images, and diffuse low signal
intensity on T2 weighted images throughout the hepatic parenchyma,
compatible with underlying iron deposition in the liver. No discrete
hepatic lesions are identified on today's noncontrast examination.
No intra or extrahepatic biliary ductal dilatation. Several filling
defects within the gallbladder are compatible with gallstones. No
evidence to suggest an acute cholecystitis at this time.

Pancreas: No definite pancreatic mass or peripancreatic inflammatory
changes on today's noncontrast examination. No pancreatic ductal
dilatation.

Spleen: Loss of signal intensity throughout the spleen on in phase
dual echo images and low signal intensity throughout the spleen on
T2 weighted images also indicative of splenic iron deposition.

Adrenals/Urinary Tract: There are multiple lesions in the kidneys
bilaterally which have various degrees of complexity. Several small
lesions are T1 hypointense, T2 hyperintense, and are favored to
represent small simple cysts (although are incompletely
characterized without IV gadolinium). Other lesions are uniformly T1
hyperintense, T2 hypointense and favored to represent hemorrhagic
cysts, best demonstrated by a 3.5 cm lesion in the interpolar region
of the left kidney (image 40 of series 10). Some other lesions
generally demonstrate T2 hyperintensity, but have dependent areas
that are T2 hypointense which correspond to areas of T1
hyperintensity, best demonstrated by a 2 cm lesion in the posterior
aspect of the upper pole of the right kidney (image 32 of series 6
and image 44 of series 10), compatible with complex cysts with
dependent proteinaceous/hemorrhagic debris. Still other lesions are
more heterogeneous in appearance with multiple internal septations,
best appreciated by several thick internal septations in an upper
pole lesion in the left kidney which measures up to 3.8 cm in
diameter (images 23, 24 and 25 of series 8). No
hydroureteronephrosis in the visualized abdomen. Bilateral adrenal
glands are normal in appearance.

Stomach/Bowel: Visualized portions are unremarkable.

Vascular/Lymphatic: No aneurysm identified in the visualized
abdominal vasculature. No lymphadenopathy noted in the abdomen.

Other:  Small volume of ascites adjacent to the liver and spleen.

Musculoskeletal: No aggressive osseous lesions are noted in the
visualized portions of the skeleton on today's noncontrast
examination.
IMPRESSION: 1. Numerous renal lesions bilaterally which appear to represent a
combination of several small simple cysts, mildly complex cysts,
proteinaceous/hemorrhagic cysts, and more complex cysts which
include cysts with thick internal septations. Unfortunately, these
are incompletely characterized on today's noncontrast examination
(but may include lesions that would otherwise be classified as
Bosniak class 3 or 4). The possibility of a cystic renal neoplasm in
the left kidney is not excluded, and if the patient cannot receive
IV gadolinium, close attention on followup studies is recommended to
ensure the stability of these lesions.
2. Iron deposition in the liver and spleen suggestive of underlying
hemochromatosis.
3. Hepatomegaly.
4. Cholelithiasis without evidence of acute cholecystitis at this
time.
5. Small volume of ascites.

## 2017-06-19 DIAGNOSIS — N186 End stage renal disease: Secondary | ICD-10-CM | POA: Diagnosis not present

## 2017-06-19 DIAGNOSIS — N2581 Secondary hyperparathyroidism of renal origin: Secondary | ICD-10-CM | POA: Diagnosis not present

## 2017-06-19 DIAGNOSIS — Z4932 Encounter for adequacy testing for peritoneal dialysis: Secondary | ICD-10-CM | POA: Diagnosis not present

## 2017-06-19 DIAGNOSIS — D631 Anemia in chronic kidney disease: Secondary | ICD-10-CM | POA: Diagnosis not present

## 2017-06-19 DIAGNOSIS — D509 Iron deficiency anemia, unspecified: Secondary | ICD-10-CM | POA: Diagnosis not present

## 2017-06-20 DIAGNOSIS — Z4932 Encounter for adequacy testing for peritoneal dialysis: Secondary | ICD-10-CM | POA: Diagnosis not present

## 2017-06-20 DIAGNOSIS — D509 Iron deficiency anemia, unspecified: Secondary | ICD-10-CM | POA: Diagnosis not present

## 2017-06-20 DIAGNOSIS — N2581 Secondary hyperparathyroidism of renal origin: Secondary | ICD-10-CM | POA: Diagnosis not present

## 2017-06-20 DIAGNOSIS — D631 Anemia in chronic kidney disease: Secondary | ICD-10-CM | POA: Diagnosis not present

## 2017-06-20 DIAGNOSIS — N186 End stage renal disease: Secondary | ICD-10-CM | POA: Diagnosis not present

## 2017-06-21 DIAGNOSIS — N2581 Secondary hyperparathyroidism of renal origin: Secondary | ICD-10-CM | POA: Diagnosis not present

## 2017-06-21 DIAGNOSIS — D631 Anemia in chronic kidney disease: Secondary | ICD-10-CM | POA: Diagnosis not present

## 2017-06-21 DIAGNOSIS — D509 Iron deficiency anemia, unspecified: Secondary | ICD-10-CM | POA: Diagnosis not present

## 2017-06-21 DIAGNOSIS — N186 End stage renal disease: Secondary | ICD-10-CM | POA: Diagnosis not present

## 2017-06-21 DIAGNOSIS — Z4932 Encounter for adequacy testing for peritoneal dialysis: Secondary | ICD-10-CM | POA: Diagnosis not present

## 2017-06-22 DIAGNOSIS — N2581 Secondary hyperparathyroidism of renal origin: Secondary | ICD-10-CM | POA: Diagnosis not present

## 2017-06-22 DIAGNOSIS — D631 Anemia in chronic kidney disease: Secondary | ICD-10-CM | POA: Diagnosis not present

## 2017-06-22 DIAGNOSIS — N186 End stage renal disease: Secondary | ICD-10-CM | POA: Diagnosis not present

## 2017-06-22 DIAGNOSIS — Z4932 Encounter for adequacy testing for peritoneal dialysis: Secondary | ICD-10-CM | POA: Diagnosis not present

## 2017-06-22 DIAGNOSIS — D509 Iron deficiency anemia, unspecified: Secondary | ICD-10-CM | POA: Diagnosis not present

## 2017-06-23 DIAGNOSIS — N186 End stage renal disease: Secondary | ICD-10-CM | POA: Diagnosis not present

## 2017-06-23 DIAGNOSIS — D631 Anemia in chronic kidney disease: Secondary | ICD-10-CM | POA: Diagnosis not present

## 2017-06-23 DIAGNOSIS — N2581 Secondary hyperparathyroidism of renal origin: Secondary | ICD-10-CM | POA: Diagnosis not present

## 2017-06-23 DIAGNOSIS — D509 Iron deficiency anemia, unspecified: Secondary | ICD-10-CM | POA: Diagnosis not present

## 2017-06-23 DIAGNOSIS — Z4932 Encounter for adequacy testing for peritoneal dialysis: Secondary | ICD-10-CM | POA: Diagnosis not present

## 2017-06-24 DIAGNOSIS — N2581 Secondary hyperparathyroidism of renal origin: Secondary | ICD-10-CM | POA: Diagnosis not present

## 2017-06-24 DIAGNOSIS — D631 Anemia in chronic kidney disease: Secondary | ICD-10-CM | POA: Diagnosis not present

## 2017-06-24 DIAGNOSIS — N186 End stage renal disease: Secondary | ICD-10-CM | POA: Diagnosis not present

## 2017-06-24 DIAGNOSIS — Z4932 Encounter for adequacy testing for peritoneal dialysis: Secondary | ICD-10-CM | POA: Diagnosis not present

## 2017-06-24 DIAGNOSIS — D509 Iron deficiency anemia, unspecified: Secondary | ICD-10-CM | POA: Diagnosis not present

## 2017-06-25 DIAGNOSIS — D509 Iron deficiency anemia, unspecified: Secondary | ICD-10-CM | POA: Diagnosis not present

## 2017-06-25 DIAGNOSIS — N2581 Secondary hyperparathyroidism of renal origin: Secondary | ICD-10-CM | POA: Diagnosis not present

## 2017-06-25 DIAGNOSIS — D631 Anemia in chronic kidney disease: Secondary | ICD-10-CM | POA: Diagnosis not present

## 2017-06-25 DIAGNOSIS — N186 End stage renal disease: Secondary | ICD-10-CM | POA: Diagnosis not present

## 2017-06-25 DIAGNOSIS — Z4932 Encounter for adequacy testing for peritoneal dialysis: Secondary | ICD-10-CM | POA: Diagnosis not present

## 2017-06-26 DIAGNOSIS — Z4932 Encounter for adequacy testing for peritoneal dialysis: Secondary | ICD-10-CM | POA: Diagnosis not present

## 2017-06-26 DIAGNOSIS — D631 Anemia in chronic kidney disease: Secondary | ICD-10-CM | POA: Diagnosis not present

## 2017-06-26 DIAGNOSIS — N2581 Secondary hyperparathyroidism of renal origin: Secondary | ICD-10-CM | POA: Diagnosis not present

## 2017-06-26 DIAGNOSIS — D509 Iron deficiency anemia, unspecified: Secondary | ICD-10-CM | POA: Diagnosis not present

## 2017-06-26 DIAGNOSIS — N186 End stage renal disease: Secondary | ICD-10-CM | POA: Diagnosis not present

## 2017-06-27 DIAGNOSIS — N186 End stage renal disease: Secondary | ICD-10-CM | POA: Diagnosis not present

## 2017-06-27 DIAGNOSIS — D509 Iron deficiency anemia, unspecified: Secondary | ICD-10-CM | POA: Diagnosis not present

## 2017-06-27 DIAGNOSIS — D631 Anemia in chronic kidney disease: Secondary | ICD-10-CM | POA: Diagnosis not present

## 2017-06-27 DIAGNOSIS — Z4932 Encounter for adequacy testing for peritoneal dialysis: Secondary | ICD-10-CM | POA: Diagnosis not present

## 2017-06-27 DIAGNOSIS — N2581 Secondary hyperparathyroidism of renal origin: Secondary | ICD-10-CM | POA: Diagnosis not present

## 2017-06-28 DIAGNOSIS — D631 Anemia in chronic kidney disease: Secondary | ICD-10-CM | POA: Diagnosis not present

## 2017-06-28 DIAGNOSIS — Z4932 Encounter for adequacy testing for peritoneal dialysis: Secondary | ICD-10-CM | POA: Diagnosis not present

## 2017-06-28 DIAGNOSIS — D509 Iron deficiency anemia, unspecified: Secondary | ICD-10-CM | POA: Diagnosis not present

## 2017-06-28 DIAGNOSIS — N2581 Secondary hyperparathyroidism of renal origin: Secondary | ICD-10-CM | POA: Diagnosis not present

## 2017-06-28 DIAGNOSIS — N186 End stage renal disease: Secondary | ICD-10-CM | POA: Diagnosis not present

## 2017-06-29 DIAGNOSIS — N2581 Secondary hyperparathyroidism of renal origin: Secondary | ICD-10-CM | POA: Diagnosis not present

## 2017-06-29 DIAGNOSIS — D631 Anemia in chronic kidney disease: Secondary | ICD-10-CM | POA: Diagnosis not present

## 2017-06-29 DIAGNOSIS — D509 Iron deficiency anemia, unspecified: Secondary | ICD-10-CM | POA: Diagnosis not present

## 2017-06-29 DIAGNOSIS — Z4932 Encounter for adequacy testing for peritoneal dialysis: Secondary | ICD-10-CM | POA: Diagnosis not present

## 2017-06-29 DIAGNOSIS — N186 End stage renal disease: Secondary | ICD-10-CM | POA: Diagnosis not present

## 2017-06-30 DIAGNOSIS — D509 Iron deficiency anemia, unspecified: Secondary | ICD-10-CM | POA: Diagnosis not present

## 2017-06-30 DIAGNOSIS — N186 End stage renal disease: Secondary | ICD-10-CM | POA: Diagnosis not present

## 2017-06-30 DIAGNOSIS — Z4932 Encounter for adequacy testing for peritoneal dialysis: Secondary | ICD-10-CM | POA: Diagnosis not present

## 2017-06-30 DIAGNOSIS — N2581 Secondary hyperparathyroidism of renal origin: Secondary | ICD-10-CM | POA: Diagnosis not present

## 2017-06-30 DIAGNOSIS — D631 Anemia in chronic kidney disease: Secondary | ICD-10-CM | POA: Diagnosis not present

## 2017-07-01 DIAGNOSIS — D631 Anemia in chronic kidney disease: Secondary | ICD-10-CM | POA: Diagnosis not present

## 2017-07-01 DIAGNOSIS — N186 End stage renal disease: Secondary | ICD-10-CM | POA: Diagnosis not present

## 2017-07-01 DIAGNOSIS — N2581 Secondary hyperparathyroidism of renal origin: Secondary | ICD-10-CM | POA: Diagnosis not present

## 2017-07-01 DIAGNOSIS — D509 Iron deficiency anemia, unspecified: Secondary | ICD-10-CM | POA: Diagnosis not present

## 2017-07-01 DIAGNOSIS — Z4932 Encounter for adequacy testing for peritoneal dialysis: Secondary | ICD-10-CM | POA: Diagnosis not present

## 2017-07-02 DIAGNOSIS — D631 Anemia in chronic kidney disease: Secondary | ICD-10-CM | POA: Diagnosis not present

## 2017-07-02 DIAGNOSIS — N186 End stage renal disease: Secondary | ICD-10-CM | POA: Diagnosis not present

## 2017-07-02 DIAGNOSIS — N2581 Secondary hyperparathyroidism of renal origin: Secondary | ICD-10-CM | POA: Diagnosis not present

## 2017-07-02 DIAGNOSIS — Z4932 Encounter for adequacy testing for peritoneal dialysis: Secondary | ICD-10-CM | POA: Diagnosis not present

## 2017-07-02 DIAGNOSIS — D509 Iron deficiency anemia, unspecified: Secondary | ICD-10-CM | POA: Diagnosis not present

## 2017-07-03 DIAGNOSIS — D509 Iron deficiency anemia, unspecified: Secondary | ICD-10-CM | POA: Diagnosis not present

## 2017-07-03 DIAGNOSIS — D631 Anemia in chronic kidney disease: Secondary | ICD-10-CM | POA: Diagnosis not present

## 2017-07-03 DIAGNOSIS — Z4932 Encounter for adequacy testing for peritoneal dialysis: Secondary | ICD-10-CM | POA: Diagnosis not present

## 2017-07-03 DIAGNOSIS — N186 End stage renal disease: Secondary | ICD-10-CM | POA: Diagnosis not present

## 2017-07-03 DIAGNOSIS — N2581 Secondary hyperparathyroidism of renal origin: Secondary | ICD-10-CM | POA: Diagnosis not present

## 2017-07-04 DIAGNOSIS — N2581 Secondary hyperparathyroidism of renal origin: Secondary | ICD-10-CM | POA: Diagnosis not present

## 2017-07-04 DIAGNOSIS — Z992 Dependence on renal dialysis: Secondary | ICD-10-CM | POA: Diagnosis not present

## 2017-07-04 DIAGNOSIS — Z4932 Encounter for adequacy testing for peritoneal dialysis: Secondary | ICD-10-CM | POA: Diagnosis not present

## 2017-07-04 DIAGNOSIS — N2889 Other specified disorders of kidney and ureter: Secondary | ICD-10-CM | POA: Diagnosis not present

## 2017-07-04 DIAGNOSIS — N186 End stage renal disease: Secondary | ICD-10-CM | POA: Diagnosis not present

## 2017-07-04 DIAGNOSIS — D631 Anemia in chronic kidney disease: Secondary | ICD-10-CM | POA: Diagnosis not present

## 2017-07-04 DIAGNOSIS — D509 Iron deficiency anemia, unspecified: Secondary | ICD-10-CM | POA: Diagnosis not present

## 2017-07-05 DIAGNOSIS — E440 Moderate protein-calorie malnutrition: Secondary | ICD-10-CM | POA: Diagnosis not present

## 2017-07-05 DIAGNOSIS — N2581 Secondary hyperparathyroidism of renal origin: Secondary | ICD-10-CM | POA: Diagnosis not present

## 2017-07-05 DIAGNOSIS — D631 Anemia in chronic kidney disease: Secondary | ICD-10-CM | POA: Diagnosis not present

## 2017-07-05 DIAGNOSIS — N186 End stage renal disease: Secondary | ICD-10-CM | POA: Diagnosis not present

## 2017-07-05 DIAGNOSIS — D509 Iron deficiency anemia, unspecified: Secondary | ICD-10-CM | POA: Diagnosis not present

## 2017-07-05 DIAGNOSIS — Z79899 Other long term (current) drug therapy: Secondary | ICD-10-CM | POA: Diagnosis not present

## 2017-07-06 DIAGNOSIS — N2581 Secondary hyperparathyroidism of renal origin: Secondary | ICD-10-CM | POA: Diagnosis not present

## 2017-07-06 DIAGNOSIS — D631 Anemia in chronic kidney disease: Secondary | ICD-10-CM | POA: Diagnosis not present

## 2017-07-06 DIAGNOSIS — N186 End stage renal disease: Secondary | ICD-10-CM | POA: Diagnosis not present

## 2017-07-06 DIAGNOSIS — E440 Moderate protein-calorie malnutrition: Secondary | ICD-10-CM | POA: Diagnosis not present

## 2017-07-06 DIAGNOSIS — Z79899 Other long term (current) drug therapy: Secondary | ICD-10-CM | POA: Diagnosis not present

## 2017-07-06 DIAGNOSIS — D509 Iron deficiency anemia, unspecified: Secondary | ICD-10-CM | POA: Diagnosis not present

## 2017-07-07 DIAGNOSIS — D631 Anemia in chronic kidney disease: Secondary | ICD-10-CM | POA: Diagnosis not present

## 2017-07-07 DIAGNOSIS — D509 Iron deficiency anemia, unspecified: Secondary | ICD-10-CM | POA: Diagnosis not present

## 2017-07-07 DIAGNOSIS — N186 End stage renal disease: Secondary | ICD-10-CM | POA: Diagnosis not present

## 2017-07-07 DIAGNOSIS — Z79899 Other long term (current) drug therapy: Secondary | ICD-10-CM | POA: Diagnosis not present

## 2017-07-07 DIAGNOSIS — E440 Moderate protein-calorie malnutrition: Secondary | ICD-10-CM | POA: Diagnosis not present

## 2017-07-07 DIAGNOSIS — N2581 Secondary hyperparathyroidism of renal origin: Secondary | ICD-10-CM | POA: Diagnosis not present

## 2017-07-08 DIAGNOSIS — N2581 Secondary hyperparathyroidism of renal origin: Secondary | ICD-10-CM | POA: Diagnosis not present

## 2017-07-08 DIAGNOSIS — N186 End stage renal disease: Secondary | ICD-10-CM | POA: Diagnosis not present

## 2017-07-08 DIAGNOSIS — D631 Anemia in chronic kidney disease: Secondary | ICD-10-CM | POA: Diagnosis not present

## 2017-07-08 DIAGNOSIS — E440 Moderate protein-calorie malnutrition: Secondary | ICD-10-CM | POA: Diagnosis not present

## 2017-07-08 DIAGNOSIS — Z79899 Other long term (current) drug therapy: Secondary | ICD-10-CM | POA: Diagnosis not present

## 2017-07-08 DIAGNOSIS — D509 Iron deficiency anemia, unspecified: Secondary | ICD-10-CM | POA: Diagnosis not present

## 2017-07-09 DIAGNOSIS — E440 Moderate protein-calorie malnutrition: Secondary | ICD-10-CM | POA: Diagnosis not present

## 2017-07-09 DIAGNOSIS — Z79899 Other long term (current) drug therapy: Secondary | ICD-10-CM | POA: Diagnosis not present

## 2017-07-09 DIAGNOSIS — D631 Anemia in chronic kidney disease: Secondary | ICD-10-CM | POA: Diagnosis not present

## 2017-07-09 DIAGNOSIS — D509 Iron deficiency anemia, unspecified: Secondary | ICD-10-CM | POA: Diagnosis not present

## 2017-07-09 DIAGNOSIS — N186 End stage renal disease: Secondary | ICD-10-CM | POA: Diagnosis not present

## 2017-07-09 DIAGNOSIS — N2581 Secondary hyperparathyroidism of renal origin: Secondary | ICD-10-CM | POA: Diagnosis not present

## 2017-07-10 DIAGNOSIS — E440 Moderate protein-calorie malnutrition: Secondary | ICD-10-CM | POA: Diagnosis not present

## 2017-07-10 DIAGNOSIS — N186 End stage renal disease: Secondary | ICD-10-CM | POA: Diagnosis not present

## 2017-07-10 DIAGNOSIS — D509 Iron deficiency anemia, unspecified: Secondary | ICD-10-CM | POA: Diagnosis not present

## 2017-07-10 DIAGNOSIS — Z79899 Other long term (current) drug therapy: Secondary | ICD-10-CM | POA: Diagnosis not present

## 2017-07-10 DIAGNOSIS — N2581 Secondary hyperparathyroidism of renal origin: Secondary | ICD-10-CM | POA: Diagnosis not present

## 2017-07-10 DIAGNOSIS — D631 Anemia in chronic kidney disease: Secondary | ICD-10-CM | POA: Diagnosis not present

## 2017-07-11 DIAGNOSIS — Z79899 Other long term (current) drug therapy: Secondary | ICD-10-CM | POA: Diagnosis not present

## 2017-07-11 DIAGNOSIS — D509 Iron deficiency anemia, unspecified: Secondary | ICD-10-CM | POA: Diagnosis not present

## 2017-07-11 DIAGNOSIS — E440 Moderate protein-calorie malnutrition: Secondary | ICD-10-CM | POA: Diagnosis not present

## 2017-07-11 DIAGNOSIS — D631 Anemia in chronic kidney disease: Secondary | ICD-10-CM | POA: Diagnosis not present

## 2017-07-11 DIAGNOSIS — N186 End stage renal disease: Secondary | ICD-10-CM | POA: Diagnosis not present

## 2017-07-11 DIAGNOSIS — N2581 Secondary hyperparathyroidism of renal origin: Secondary | ICD-10-CM | POA: Diagnosis not present

## 2017-07-12 DIAGNOSIS — D631 Anemia in chronic kidney disease: Secondary | ICD-10-CM | POA: Diagnosis not present

## 2017-07-12 DIAGNOSIS — N2581 Secondary hyperparathyroidism of renal origin: Secondary | ICD-10-CM | POA: Diagnosis not present

## 2017-07-12 DIAGNOSIS — Z7682 Awaiting organ transplant status: Secondary | ICD-10-CM | POA: Diagnosis not present

## 2017-07-12 DIAGNOSIS — E440 Moderate protein-calorie malnutrition: Secondary | ICD-10-CM | POA: Diagnosis not present

## 2017-07-12 DIAGNOSIS — R82998 Other abnormal findings in urine: Secondary | ICD-10-CM | POA: Diagnosis not present

## 2017-07-12 DIAGNOSIS — D509 Iron deficiency anemia, unspecified: Secondary | ICD-10-CM | POA: Diagnosis not present

## 2017-07-12 DIAGNOSIS — Z79899 Other long term (current) drug therapy: Secondary | ICD-10-CM | POA: Diagnosis not present

## 2017-07-12 DIAGNOSIS — N186 End stage renal disease: Secondary | ICD-10-CM | POA: Diagnosis not present

## 2017-07-13 DIAGNOSIS — D509 Iron deficiency anemia, unspecified: Secondary | ICD-10-CM | POA: Diagnosis not present

## 2017-07-13 DIAGNOSIS — E440 Moderate protein-calorie malnutrition: Secondary | ICD-10-CM | POA: Diagnosis not present

## 2017-07-13 DIAGNOSIS — N2581 Secondary hyperparathyroidism of renal origin: Secondary | ICD-10-CM | POA: Diagnosis not present

## 2017-07-13 DIAGNOSIS — N186 End stage renal disease: Secondary | ICD-10-CM | POA: Diagnosis not present

## 2017-07-13 DIAGNOSIS — Z79899 Other long term (current) drug therapy: Secondary | ICD-10-CM | POA: Diagnosis not present

## 2017-07-13 DIAGNOSIS — D631 Anemia in chronic kidney disease: Secondary | ICD-10-CM | POA: Diagnosis not present

## 2017-07-14 DIAGNOSIS — E440 Moderate protein-calorie malnutrition: Secondary | ICD-10-CM | POA: Diagnosis not present

## 2017-07-14 DIAGNOSIS — D631 Anemia in chronic kidney disease: Secondary | ICD-10-CM | POA: Diagnosis not present

## 2017-07-14 DIAGNOSIS — N186 End stage renal disease: Secondary | ICD-10-CM | POA: Diagnosis not present

## 2017-07-14 DIAGNOSIS — N2581 Secondary hyperparathyroidism of renal origin: Secondary | ICD-10-CM | POA: Diagnosis not present

## 2017-07-14 DIAGNOSIS — Z79899 Other long term (current) drug therapy: Secondary | ICD-10-CM | POA: Diagnosis not present

## 2017-07-14 DIAGNOSIS — D509 Iron deficiency anemia, unspecified: Secondary | ICD-10-CM | POA: Diagnosis not present

## 2017-07-15 DIAGNOSIS — N2581 Secondary hyperparathyroidism of renal origin: Secondary | ICD-10-CM | POA: Diagnosis not present

## 2017-07-15 DIAGNOSIS — Z79899 Other long term (current) drug therapy: Secondary | ICD-10-CM | POA: Diagnosis not present

## 2017-07-15 DIAGNOSIS — E440 Moderate protein-calorie malnutrition: Secondary | ICD-10-CM | POA: Diagnosis not present

## 2017-07-15 DIAGNOSIS — D509 Iron deficiency anemia, unspecified: Secondary | ICD-10-CM | POA: Diagnosis not present

## 2017-07-15 DIAGNOSIS — D631 Anemia in chronic kidney disease: Secondary | ICD-10-CM | POA: Diagnosis not present

## 2017-07-15 DIAGNOSIS — N186 End stage renal disease: Secondary | ICD-10-CM | POA: Diagnosis not present

## 2017-07-16 DIAGNOSIS — D509 Iron deficiency anemia, unspecified: Secondary | ICD-10-CM | POA: Diagnosis not present

## 2017-07-16 DIAGNOSIS — Z79899 Other long term (current) drug therapy: Secondary | ICD-10-CM | POA: Diagnosis not present

## 2017-07-16 DIAGNOSIS — E440 Moderate protein-calorie malnutrition: Secondary | ICD-10-CM | POA: Diagnosis not present

## 2017-07-16 DIAGNOSIS — D631 Anemia in chronic kidney disease: Secondary | ICD-10-CM | POA: Diagnosis not present

## 2017-07-16 DIAGNOSIS — N186 End stage renal disease: Secondary | ICD-10-CM | POA: Diagnosis not present

## 2017-07-16 DIAGNOSIS — N2581 Secondary hyperparathyroidism of renal origin: Secondary | ICD-10-CM | POA: Diagnosis not present

## 2017-07-17 DIAGNOSIS — N2581 Secondary hyperparathyroidism of renal origin: Secondary | ICD-10-CM | POA: Diagnosis not present

## 2017-07-17 DIAGNOSIS — D631 Anemia in chronic kidney disease: Secondary | ICD-10-CM | POA: Diagnosis not present

## 2017-07-17 DIAGNOSIS — E440 Moderate protein-calorie malnutrition: Secondary | ICD-10-CM | POA: Diagnosis not present

## 2017-07-17 DIAGNOSIS — D509 Iron deficiency anemia, unspecified: Secondary | ICD-10-CM | POA: Diagnosis not present

## 2017-07-17 DIAGNOSIS — Z79899 Other long term (current) drug therapy: Secondary | ICD-10-CM | POA: Diagnosis not present

## 2017-07-17 DIAGNOSIS — N186 End stage renal disease: Secondary | ICD-10-CM | POA: Diagnosis not present

## 2017-07-18 DIAGNOSIS — D509 Iron deficiency anemia, unspecified: Secondary | ICD-10-CM | POA: Diagnosis not present

## 2017-07-18 DIAGNOSIS — E440 Moderate protein-calorie malnutrition: Secondary | ICD-10-CM | POA: Diagnosis not present

## 2017-07-18 DIAGNOSIS — N2581 Secondary hyperparathyroidism of renal origin: Secondary | ICD-10-CM | POA: Diagnosis not present

## 2017-07-18 DIAGNOSIS — D631 Anemia in chronic kidney disease: Secondary | ICD-10-CM | POA: Diagnosis not present

## 2017-07-18 DIAGNOSIS — N186 End stage renal disease: Secondary | ICD-10-CM | POA: Diagnosis not present

## 2017-07-18 DIAGNOSIS — Z79899 Other long term (current) drug therapy: Secondary | ICD-10-CM | POA: Diagnosis not present

## 2017-07-19 DIAGNOSIS — D631 Anemia in chronic kidney disease: Secondary | ICD-10-CM | POA: Diagnosis not present

## 2017-07-19 DIAGNOSIS — N186 End stage renal disease: Secondary | ICD-10-CM | POA: Diagnosis not present

## 2017-07-19 DIAGNOSIS — E440 Moderate protein-calorie malnutrition: Secondary | ICD-10-CM | POA: Diagnosis not present

## 2017-07-19 DIAGNOSIS — D509 Iron deficiency anemia, unspecified: Secondary | ICD-10-CM | POA: Diagnosis not present

## 2017-07-19 DIAGNOSIS — N2581 Secondary hyperparathyroidism of renal origin: Secondary | ICD-10-CM | POA: Diagnosis not present

## 2017-07-19 DIAGNOSIS — Z79899 Other long term (current) drug therapy: Secondary | ICD-10-CM | POA: Diagnosis not present

## 2017-07-20 DIAGNOSIS — N2581 Secondary hyperparathyroidism of renal origin: Secondary | ICD-10-CM | POA: Diagnosis not present

## 2017-07-20 DIAGNOSIS — Z79899 Other long term (current) drug therapy: Secondary | ICD-10-CM | POA: Diagnosis not present

## 2017-07-20 DIAGNOSIS — N186 End stage renal disease: Secondary | ICD-10-CM | POA: Diagnosis not present

## 2017-07-20 DIAGNOSIS — D509 Iron deficiency anemia, unspecified: Secondary | ICD-10-CM | POA: Diagnosis not present

## 2017-07-20 DIAGNOSIS — E440 Moderate protein-calorie malnutrition: Secondary | ICD-10-CM | POA: Diagnosis not present

## 2017-07-20 DIAGNOSIS — D631 Anemia in chronic kidney disease: Secondary | ICD-10-CM | POA: Diagnosis not present

## 2017-07-21 DIAGNOSIS — N186 End stage renal disease: Secondary | ICD-10-CM | POA: Diagnosis not present

## 2017-07-21 DIAGNOSIS — Z79899 Other long term (current) drug therapy: Secondary | ICD-10-CM | POA: Diagnosis not present

## 2017-07-21 DIAGNOSIS — E440 Moderate protein-calorie malnutrition: Secondary | ICD-10-CM | POA: Diagnosis not present

## 2017-07-21 DIAGNOSIS — N2581 Secondary hyperparathyroidism of renal origin: Secondary | ICD-10-CM | POA: Diagnosis not present

## 2017-07-21 DIAGNOSIS — D509 Iron deficiency anemia, unspecified: Secondary | ICD-10-CM | POA: Diagnosis not present

## 2017-07-21 DIAGNOSIS — D631 Anemia in chronic kidney disease: Secondary | ICD-10-CM | POA: Diagnosis not present

## 2017-07-22 DIAGNOSIS — D631 Anemia in chronic kidney disease: Secondary | ICD-10-CM | POA: Diagnosis not present

## 2017-07-22 DIAGNOSIS — Z79899 Other long term (current) drug therapy: Secondary | ICD-10-CM | POA: Diagnosis not present

## 2017-07-22 DIAGNOSIS — N186 End stage renal disease: Secondary | ICD-10-CM | POA: Diagnosis not present

## 2017-07-22 DIAGNOSIS — N2581 Secondary hyperparathyroidism of renal origin: Secondary | ICD-10-CM | POA: Diagnosis not present

## 2017-07-22 DIAGNOSIS — E440 Moderate protein-calorie malnutrition: Secondary | ICD-10-CM | POA: Diagnosis not present

## 2017-07-22 DIAGNOSIS — D509 Iron deficiency anemia, unspecified: Secondary | ICD-10-CM | POA: Diagnosis not present

## 2017-07-23 DIAGNOSIS — Z7682 Awaiting organ transplant status: Secondary | ICD-10-CM | POA: Diagnosis not present

## 2017-07-23 DIAGNOSIS — Z79899 Other long term (current) drug therapy: Secondary | ICD-10-CM | POA: Diagnosis not present

## 2017-07-23 DIAGNOSIS — E440 Moderate protein-calorie malnutrition: Secondary | ICD-10-CM | POA: Diagnosis not present

## 2017-07-23 DIAGNOSIS — N186 End stage renal disease: Secondary | ICD-10-CM | POA: Diagnosis not present

## 2017-07-23 DIAGNOSIS — D509 Iron deficiency anemia, unspecified: Secondary | ICD-10-CM | POA: Diagnosis not present

## 2017-07-23 DIAGNOSIS — D631 Anemia in chronic kidney disease: Secondary | ICD-10-CM | POA: Diagnosis not present

## 2017-07-23 DIAGNOSIS — N2581 Secondary hyperparathyroidism of renal origin: Secondary | ICD-10-CM | POA: Diagnosis not present

## 2017-07-24 DIAGNOSIS — D631 Anemia in chronic kidney disease: Secondary | ICD-10-CM | POA: Diagnosis not present

## 2017-07-24 DIAGNOSIS — N2581 Secondary hyperparathyroidism of renal origin: Secondary | ICD-10-CM | POA: Diagnosis not present

## 2017-07-24 DIAGNOSIS — D509 Iron deficiency anemia, unspecified: Secondary | ICD-10-CM | POA: Diagnosis not present

## 2017-07-24 DIAGNOSIS — E440 Moderate protein-calorie malnutrition: Secondary | ICD-10-CM | POA: Diagnosis not present

## 2017-07-24 DIAGNOSIS — Z79899 Other long term (current) drug therapy: Secondary | ICD-10-CM | POA: Diagnosis not present

## 2017-07-24 DIAGNOSIS — N186 End stage renal disease: Secondary | ICD-10-CM | POA: Diagnosis not present

## 2017-07-25 DIAGNOSIS — Z79899 Other long term (current) drug therapy: Secondary | ICD-10-CM | POA: Diagnosis not present

## 2017-07-25 DIAGNOSIS — N186 End stage renal disease: Secondary | ICD-10-CM | POA: Diagnosis not present

## 2017-07-25 DIAGNOSIS — E440 Moderate protein-calorie malnutrition: Secondary | ICD-10-CM | POA: Diagnosis not present

## 2017-07-25 DIAGNOSIS — D509 Iron deficiency anemia, unspecified: Secondary | ICD-10-CM | POA: Diagnosis not present

## 2017-07-25 DIAGNOSIS — N2581 Secondary hyperparathyroidism of renal origin: Secondary | ICD-10-CM | POA: Diagnosis not present

## 2017-07-25 DIAGNOSIS — D631 Anemia in chronic kidney disease: Secondary | ICD-10-CM | POA: Diagnosis not present

## 2017-07-26 DIAGNOSIS — E440 Moderate protein-calorie malnutrition: Secondary | ICD-10-CM | POA: Diagnosis not present

## 2017-07-26 DIAGNOSIS — N2581 Secondary hyperparathyroidism of renal origin: Secondary | ICD-10-CM | POA: Diagnosis not present

## 2017-07-26 DIAGNOSIS — D631 Anemia in chronic kidney disease: Secondary | ICD-10-CM | POA: Diagnosis not present

## 2017-07-26 DIAGNOSIS — N186 End stage renal disease: Secondary | ICD-10-CM | POA: Diagnosis not present

## 2017-07-26 DIAGNOSIS — D509 Iron deficiency anemia, unspecified: Secondary | ICD-10-CM | POA: Diagnosis not present

## 2017-07-26 DIAGNOSIS — Z79899 Other long term (current) drug therapy: Secondary | ICD-10-CM | POA: Diagnosis not present

## 2017-07-27 DIAGNOSIS — D631 Anemia in chronic kidney disease: Secondary | ICD-10-CM | POA: Diagnosis not present

## 2017-07-27 DIAGNOSIS — Z79899 Other long term (current) drug therapy: Secondary | ICD-10-CM | POA: Diagnosis not present

## 2017-07-27 DIAGNOSIS — E440 Moderate protein-calorie malnutrition: Secondary | ICD-10-CM | POA: Diagnosis not present

## 2017-07-27 DIAGNOSIS — D509 Iron deficiency anemia, unspecified: Secondary | ICD-10-CM | POA: Diagnosis not present

## 2017-07-27 DIAGNOSIS — N186 End stage renal disease: Secondary | ICD-10-CM | POA: Diagnosis not present

## 2017-07-27 DIAGNOSIS — N2581 Secondary hyperparathyroidism of renal origin: Secondary | ICD-10-CM | POA: Diagnosis not present

## 2017-07-28 DIAGNOSIS — D509 Iron deficiency anemia, unspecified: Secondary | ICD-10-CM | POA: Diagnosis not present

## 2017-07-28 DIAGNOSIS — N2581 Secondary hyperparathyroidism of renal origin: Secondary | ICD-10-CM | POA: Diagnosis not present

## 2017-07-28 DIAGNOSIS — N186 End stage renal disease: Secondary | ICD-10-CM | POA: Diagnosis not present

## 2017-07-28 DIAGNOSIS — E440 Moderate protein-calorie malnutrition: Secondary | ICD-10-CM | POA: Diagnosis not present

## 2017-07-28 DIAGNOSIS — Z79899 Other long term (current) drug therapy: Secondary | ICD-10-CM | POA: Diagnosis not present

## 2017-07-28 DIAGNOSIS — D631 Anemia in chronic kidney disease: Secondary | ICD-10-CM | POA: Diagnosis not present

## 2017-07-29 DIAGNOSIS — N186 End stage renal disease: Secondary | ICD-10-CM | POA: Diagnosis not present

## 2017-07-29 DIAGNOSIS — D631 Anemia in chronic kidney disease: Secondary | ICD-10-CM | POA: Diagnosis not present

## 2017-07-29 DIAGNOSIS — N2581 Secondary hyperparathyroidism of renal origin: Secondary | ICD-10-CM | POA: Diagnosis not present

## 2017-07-29 DIAGNOSIS — E440 Moderate protein-calorie malnutrition: Secondary | ICD-10-CM | POA: Diagnosis not present

## 2017-07-29 DIAGNOSIS — Z79899 Other long term (current) drug therapy: Secondary | ICD-10-CM | POA: Diagnosis not present

## 2017-07-29 DIAGNOSIS — D509 Iron deficiency anemia, unspecified: Secondary | ICD-10-CM | POA: Diagnosis not present

## 2017-07-30 DIAGNOSIS — D509 Iron deficiency anemia, unspecified: Secondary | ICD-10-CM | POA: Diagnosis not present

## 2017-07-30 DIAGNOSIS — N186 End stage renal disease: Secondary | ICD-10-CM | POA: Diagnosis not present

## 2017-07-30 DIAGNOSIS — E440 Moderate protein-calorie malnutrition: Secondary | ICD-10-CM | POA: Diagnosis not present

## 2017-07-30 DIAGNOSIS — Z79899 Other long term (current) drug therapy: Secondary | ICD-10-CM | POA: Diagnosis not present

## 2017-07-30 DIAGNOSIS — N2581 Secondary hyperparathyroidism of renal origin: Secondary | ICD-10-CM | POA: Diagnosis not present

## 2017-07-30 DIAGNOSIS — D631 Anemia in chronic kidney disease: Secondary | ICD-10-CM | POA: Diagnosis not present

## 2017-07-31 DIAGNOSIS — E440 Moderate protein-calorie malnutrition: Secondary | ICD-10-CM | POA: Diagnosis not present

## 2017-07-31 DIAGNOSIS — D631 Anemia in chronic kidney disease: Secondary | ICD-10-CM | POA: Diagnosis not present

## 2017-07-31 DIAGNOSIS — Z79899 Other long term (current) drug therapy: Secondary | ICD-10-CM | POA: Diagnosis not present

## 2017-07-31 DIAGNOSIS — D509 Iron deficiency anemia, unspecified: Secondary | ICD-10-CM | POA: Diagnosis not present

## 2017-07-31 DIAGNOSIS — N186 End stage renal disease: Secondary | ICD-10-CM | POA: Diagnosis not present

## 2017-07-31 DIAGNOSIS — N2581 Secondary hyperparathyroidism of renal origin: Secondary | ICD-10-CM | POA: Diagnosis not present

## 2017-08-01 DIAGNOSIS — Z79899 Other long term (current) drug therapy: Secondary | ICD-10-CM | POA: Diagnosis not present

## 2017-08-01 DIAGNOSIS — D509 Iron deficiency anemia, unspecified: Secondary | ICD-10-CM | POA: Diagnosis not present

## 2017-08-01 DIAGNOSIS — N186 End stage renal disease: Secondary | ICD-10-CM | POA: Diagnosis not present

## 2017-08-01 DIAGNOSIS — D631 Anemia in chronic kidney disease: Secondary | ICD-10-CM | POA: Diagnosis not present

## 2017-08-01 DIAGNOSIS — N2581 Secondary hyperparathyroidism of renal origin: Secondary | ICD-10-CM | POA: Diagnosis not present

## 2017-08-01 DIAGNOSIS — E440 Moderate protein-calorie malnutrition: Secondary | ICD-10-CM | POA: Diagnosis not present

## 2017-08-02 DIAGNOSIS — Z79899 Other long term (current) drug therapy: Secondary | ICD-10-CM | POA: Diagnosis not present

## 2017-08-02 DIAGNOSIS — N2581 Secondary hyperparathyroidism of renal origin: Secondary | ICD-10-CM | POA: Diagnosis not present

## 2017-08-02 DIAGNOSIS — E440 Moderate protein-calorie malnutrition: Secondary | ICD-10-CM | POA: Diagnosis not present

## 2017-08-02 DIAGNOSIS — D509 Iron deficiency anemia, unspecified: Secondary | ICD-10-CM | POA: Diagnosis not present

## 2017-08-02 DIAGNOSIS — N186 End stage renal disease: Secondary | ICD-10-CM | POA: Diagnosis not present

## 2017-08-02 DIAGNOSIS — D631 Anemia in chronic kidney disease: Secondary | ICD-10-CM | POA: Diagnosis not present

## 2017-08-03 DIAGNOSIS — N2889 Other specified disorders of kidney and ureter: Secondary | ICD-10-CM | POA: Diagnosis not present

## 2017-08-03 DIAGNOSIS — D509 Iron deficiency anemia, unspecified: Secondary | ICD-10-CM | POA: Diagnosis not present

## 2017-08-03 DIAGNOSIS — Z79899 Other long term (current) drug therapy: Secondary | ICD-10-CM | POA: Diagnosis not present

## 2017-08-03 DIAGNOSIS — N186 End stage renal disease: Secondary | ICD-10-CM | POA: Diagnosis not present

## 2017-08-03 DIAGNOSIS — N2581 Secondary hyperparathyroidism of renal origin: Secondary | ICD-10-CM | POA: Diagnosis not present

## 2017-08-03 DIAGNOSIS — E440 Moderate protein-calorie malnutrition: Secondary | ICD-10-CM | POA: Diagnosis not present

## 2017-08-03 DIAGNOSIS — Z992 Dependence on renal dialysis: Secondary | ICD-10-CM | POA: Diagnosis not present

## 2017-08-03 DIAGNOSIS — D631 Anemia in chronic kidney disease: Secondary | ICD-10-CM | POA: Diagnosis not present

## 2017-08-04 DIAGNOSIS — D631 Anemia in chronic kidney disease: Secondary | ICD-10-CM | POA: Diagnosis not present

## 2017-08-04 DIAGNOSIS — N2581 Secondary hyperparathyroidism of renal origin: Secondary | ICD-10-CM | POA: Diagnosis not present

## 2017-08-04 DIAGNOSIS — N186 End stage renal disease: Secondary | ICD-10-CM | POA: Diagnosis not present

## 2017-08-04 DIAGNOSIS — E440 Moderate protein-calorie malnutrition: Secondary | ICD-10-CM | POA: Diagnosis not present

## 2017-08-04 DIAGNOSIS — Z79899 Other long term (current) drug therapy: Secondary | ICD-10-CM | POA: Diagnosis not present

## 2017-08-04 DIAGNOSIS — D509 Iron deficiency anemia, unspecified: Secondary | ICD-10-CM | POA: Diagnosis not present

## 2017-08-05 DIAGNOSIS — D509 Iron deficiency anemia, unspecified: Secondary | ICD-10-CM | POA: Diagnosis not present

## 2017-08-05 DIAGNOSIS — D631 Anemia in chronic kidney disease: Secondary | ICD-10-CM | POA: Diagnosis not present

## 2017-08-05 DIAGNOSIS — E440 Moderate protein-calorie malnutrition: Secondary | ICD-10-CM | POA: Diagnosis not present

## 2017-08-05 DIAGNOSIS — Z79899 Other long term (current) drug therapy: Secondary | ICD-10-CM | POA: Diagnosis not present

## 2017-08-05 DIAGNOSIS — N2581 Secondary hyperparathyroidism of renal origin: Secondary | ICD-10-CM | POA: Diagnosis not present

## 2017-08-05 DIAGNOSIS — N186 End stage renal disease: Secondary | ICD-10-CM | POA: Diagnosis not present

## 2017-08-06 DIAGNOSIS — D509 Iron deficiency anemia, unspecified: Secondary | ICD-10-CM | POA: Diagnosis not present

## 2017-08-06 DIAGNOSIS — N2581 Secondary hyperparathyroidism of renal origin: Secondary | ICD-10-CM | POA: Diagnosis not present

## 2017-08-06 DIAGNOSIS — E440 Moderate protein-calorie malnutrition: Secondary | ICD-10-CM | POA: Diagnosis not present

## 2017-08-06 DIAGNOSIS — N186 End stage renal disease: Secondary | ICD-10-CM | POA: Diagnosis not present

## 2017-08-06 DIAGNOSIS — D631 Anemia in chronic kidney disease: Secondary | ICD-10-CM | POA: Diagnosis not present

## 2017-08-06 DIAGNOSIS — Z79899 Other long term (current) drug therapy: Secondary | ICD-10-CM | POA: Diagnosis not present

## 2017-08-07 DIAGNOSIS — E440 Moderate protein-calorie malnutrition: Secondary | ICD-10-CM | POA: Diagnosis not present

## 2017-08-07 DIAGNOSIS — Z79899 Other long term (current) drug therapy: Secondary | ICD-10-CM | POA: Diagnosis not present

## 2017-08-07 DIAGNOSIS — D509 Iron deficiency anemia, unspecified: Secondary | ICD-10-CM | POA: Diagnosis not present

## 2017-08-07 DIAGNOSIS — N2581 Secondary hyperparathyroidism of renal origin: Secondary | ICD-10-CM | POA: Diagnosis not present

## 2017-08-07 DIAGNOSIS — D631 Anemia in chronic kidney disease: Secondary | ICD-10-CM | POA: Diagnosis not present

## 2017-08-07 DIAGNOSIS — N186 End stage renal disease: Secondary | ICD-10-CM | POA: Diagnosis not present

## 2017-08-07 DIAGNOSIS — R82998 Other abnormal findings in urine: Secondary | ICD-10-CM | POA: Diagnosis not present

## 2017-08-08 DIAGNOSIS — D631 Anemia in chronic kidney disease: Secondary | ICD-10-CM | POA: Diagnosis not present

## 2017-08-08 DIAGNOSIS — N186 End stage renal disease: Secondary | ICD-10-CM | POA: Diagnosis not present

## 2017-08-08 DIAGNOSIS — Z79899 Other long term (current) drug therapy: Secondary | ICD-10-CM | POA: Diagnosis not present

## 2017-08-08 DIAGNOSIS — E440 Moderate protein-calorie malnutrition: Secondary | ICD-10-CM | POA: Diagnosis not present

## 2017-08-08 DIAGNOSIS — D509 Iron deficiency anemia, unspecified: Secondary | ICD-10-CM | POA: Diagnosis not present

## 2017-08-08 DIAGNOSIS — N2581 Secondary hyperparathyroidism of renal origin: Secondary | ICD-10-CM | POA: Diagnosis not present

## 2017-08-09 DIAGNOSIS — E440 Moderate protein-calorie malnutrition: Secondary | ICD-10-CM | POA: Diagnosis not present

## 2017-08-09 DIAGNOSIS — N2581 Secondary hyperparathyroidism of renal origin: Secondary | ICD-10-CM | POA: Diagnosis not present

## 2017-08-09 DIAGNOSIS — D631 Anemia in chronic kidney disease: Secondary | ICD-10-CM | POA: Diagnosis not present

## 2017-08-09 DIAGNOSIS — N186 End stage renal disease: Secondary | ICD-10-CM | POA: Diagnosis not present

## 2017-08-09 DIAGNOSIS — D509 Iron deficiency anemia, unspecified: Secondary | ICD-10-CM | POA: Diagnosis not present

## 2017-08-09 DIAGNOSIS — Z79899 Other long term (current) drug therapy: Secondary | ICD-10-CM | POA: Diagnosis not present

## 2017-08-10 DIAGNOSIS — N2581 Secondary hyperparathyroidism of renal origin: Secondary | ICD-10-CM | POA: Diagnosis not present

## 2017-08-10 DIAGNOSIS — D631 Anemia in chronic kidney disease: Secondary | ICD-10-CM | POA: Diagnosis not present

## 2017-08-10 DIAGNOSIS — D509 Iron deficiency anemia, unspecified: Secondary | ICD-10-CM | POA: Diagnosis not present

## 2017-08-10 DIAGNOSIS — E440 Moderate protein-calorie malnutrition: Secondary | ICD-10-CM | POA: Diagnosis not present

## 2017-08-10 DIAGNOSIS — N186 End stage renal disease: Secondary | ICD-10-CM | POA: Diagnosis not present

## 2017-08-10 DIAGNOSIS — Z79899 Other long term (current) drug therapy: Secondary | ICD-10-CM | POA: Diagnosis not present

## 2017-08-11 DIAGNOSIS — Z79899 Other long term (current) drug therapy: Secondary | ICD-10-CM | POA: Diagnosis not present

## 2017-08-11 DIAGNOSIS — E440 Moderate protein-calorie malnutrition: Secondary | ICD-10-CM | POA: Diagnosis not present

## 2017-08-11 DIAGNOSIS — D509 Iron deficiency anemia, unspecified: Secondary | ICD-10-CM | POA: Diagnosis not present

## 2017-08-11 DIAGNOSIS — N186 End stage renal disease: Secondary | ICD-10-CM | POA: Diagnosis not present

## 2017-08-11 DIAGNOSIS — D631 Anemia in chronic kidney disease: Secondary | ICD-10-CM | POA: Diagnosis not present

## 2017-08-11 DIAGNOSIS — N2581 Secondary hyperparathyroidism of renal origin: Secondary | ICD-10-CM | POA: Diagnosis not present

## 2017-08-12 DIAGNOSIS — Z79899 Other long term (current) drug therapy: Secondary | ICD-10-CM | POA: Diagnosis not present

## 2017-08-12 DIAGNOSIS — N186 End stage renal disease: Secondary | ICD-10-CM | POA: Diagnosis not present

## 2017-08-12 DIAGNOSIS — D509 Iron deficiency anemia, unspecified: Secondary | ICD-10-CM | POA: Diagnosis not present

## 2017-08-12 DIAGNOSIS — D631 Anemia in chronic kidney disease: Secondary | ICD-10-CM | POA: Diagnosis not present

## 2017-08-12 DIAGNOSIS — N2581 Secondary hyperparathyroidism of renal origin: Secondary | ICD-10-CM | POA: Diagnosis not present

## 2017-08-12 DIAGNOSIS — E440 Moderate protein-calorie malnutrition: Secondary | ICD-10-CM | POA: Diagnosis not present

## 2017-08-13 DIAGNOSIS — D509 Iron deficiency anemia, unspecified: Secondary | ICD-10-CM | POA: Diagnosis not present

## 2017-08-13 DIAGNOSIS — N186 End stage renal disease: Secondary | ICD-10-CM | POA: Diagnosis not present

## 2017-08-13 DIAGNOSIS — Z79899 Other long term (current) drug therapy: Secondary | ICD-10-CM | POA: Diagnosis not present

## 2017-08-13 DIAGNOSIS — E440 Moderate protein-calorie malnutrition: Secondary | ICD-10-CM | POA: Diagnosis not present

## 2017-08-13 DIAGNOSIS — D631 Anemia in chronic kidney disease: Secondary | ICD-10-CM | POA: Diagnosis not present

## 2017-08-13 DIAGNOSIS — N2581 Secondary hyperparathyroidism of renal origin: Secondary | ICD-10-CM | POA: Diagnosis not present

## 2017-08-14 DIAGNOSIS — E440 Moderate protein-calorie malnutrition: Secondary | ICD-10-CM | POA: Diagnosis not present

## 2017-08-14 DIAGNOSIS — N2581 Secondary hyperparathyroidism of renal origin: Secondary | ICD-10-CM | POA: Diagnosis not present

## 2017-08-14 DIAGNOSIS — D509 Iron deficiency anemia, unspecified: Secondary | ICD-10-CM | POA: Diagnosis not present

## 2017-08-14 DIAGNOSIS — Z79899 Other long term (current) drug therapy: Secondary | ICD-10-CM | POA: Diagnosis not present

## 2017-08-14 DIAGNOSIS — D631 Anemia in chronic kidney disease: Secondary | ICD-10-CM | POA: Diagnosis not present

## 2017-08-14 DIAGNOSIS — N186 End stage renal disease: Secondary | ICD-10-CM | POA: Diagnosis not present

## 2017-08-15 DIAGNOSIS — D631 Anemia in chronic kidney disease: Secondary | ICD-10-CM | POA: Diagnosis not present

## 2017-08-15 DIAGNOSIS — Z79899 Other long term (current) drug therapy: Secondary | ICD-10-CM | POA: Diagnosis not present

## 2017-08-15 DIAGNOSIS — D509 Iron deficiency anemia, unspecified: Secondary | ICD-10-CM | POA: Diagnosis not present

## 2017-08-15 DIAGNOSIS — E440 Moderate protein-calorie malnutrition: Secondary | ICD-10-CM | POA: Diagnosis not present

## 2017-08-15 DIAGNOSIS — N186 End stage renal disease: Secondary | ICD-10-CM | POA: Diagnosis not present

## 2017-08-15 DIAGNOSIS — N2581 Secondary hyperparathyroidism of renal origin: Secondary | ICD-10-CM | POA: Diagnosis not present

## 2017-08-16 DIAGNOSIS — D509 Iron deficiency anemia, unspecified: Secondary | ICD-10-CM | POA: Diagnosis not present

## 2017-08-16 DIAGNOSIS — N186 End stage renal disease: Secondary | ICD-10-CM | POA: Diagnosis not present

## 2017-08-16 DIAGNOSIS — Z79899 Other long term (current) drug therapy: Secondary | ICD-10-CM | POA: Diagnosis not present

## 2017-08-16 DIAGNOSIS — E440 Moderate protein-calorie malnutrition: Secondary | ICD-10-CM | POA: Diagnosis not present

## 2017-08-16 DIAGNOSIS — D631 Anemia in chronic kidney disease: Secondary | ICD-10-CM | POA: Diagnosis not present

## 2017-08-16 DIAGNOSIS — N2581 Secondary hyperparathyroidism of renal origin: Secondary | ICD-10-CM | POA: Diagnosis not present

## 2017-08-17 DIAGNOSIS — N2581 Secondary hyperparathyroidism of renal origin: Secondary | ICD-10-CM | POA: Diagnosis not present

## 2017-08-17 DIAGNOSIS — E440 Moderate protein-calorie malnutrition: Secondary | ICD-10-CM | POA: Diagnosis not present

## 2017-08-17 DIAGNOSIS — D509 Iron deficiency anemia, unspecified: Secondary | ICD-10-CM | POA: Diagnosis not present

## 2017-08-17 DIAGNOSIS — D631 Anemia in chronic kidney disease: Secondary | ICD-10-CM | POA: Diagnosis not present

## 2017-08-17 DIAGNOSIS — N186 End stage renal disease: Secondary | ICD-10-CM | POA: Diagnosis not present

## 2017-08-17 DIAGNOSIS — Z79899 Other long term (current) drug therapy: Secondary | ICD-10-CM | POA: Diagnosis not present

## 2017-08-18 DIAGNOSIS — D631 Anemia in chronic kidney disease: Secondary | ICD-10-CM | POA: Diagnosis not present

## 2017-08-18 DIAGNOSIS — Z79899 Other long term (current) drug therapy: Secondary | ICD-10-CM | POA: Diagnosis not present

## 2017-08-18 DIAGNOSIS — E440 Moderate protein-calorie malnutrition: Secondary | ICD-10-CM | POA: Diagnosis not present

## 2017-08-18 DIAGNOSIS — N186 End stage renal disease: Secondary | ICD-10-CM | POA: Diagnosis not present

## 2017-08-18 DIAGNOSIS — D509 Iron deficiency anemia, unspecified: Secondary | ICD-10-CM | POA: Diagnosis not present

## 2017-08-18 DIAGNOSIS — N2581 Secondary hyperparathyroidism of renal origin: Secondary | ICD-10-CM | POA: Diagnosis not present

## 2017-08-19 DIAGNOSIS — D509 Iron deficiency anemia, unspecified: Secondary | ICD-10-CM | POA: Diagnosis not present

## 2017-08-19 DIAGNOSIS — N2581 Secondary hyperparathyroidism of renal origin: Secondary | ICD-10-CM | POA: Diagnosis not present

## 2017-08-19 DIAGNOSIS — Z79899 Other long term (current) drug therapy: Secondary | ICD-10-CM | POA: Diagnosis not present

## 2017-08-19 DIAGNOSIS — E440 Moderate protein-calorie malnutrition: Secondary | ICD-10-CM | POA: Diagnosis not present

## 2017-08-19 DIAGNOSIS — N186 End stage renal disease: Secondary | ICD-10-CM | POA: Diagnosis not present

## 2017-08-19 DIAGNOSIS — D631 Anemia in chronic kidney disease: Secondary | ICD-10-CM | POA: Diagnosis not present

## 2017-08-20 DIAGNOSIS — E440 Moderate protein-calorie malnutrition: Secondary | ICD-10-CM | POA: Diagnosis not present

## 2017-08-20 DIAGNOSIS — Z6824 Body mass index (BMI) 24.0-24.9, adult: Secondary | ICD-10-CM | POA: Diagnosis not present

## 2017-08-20 DIAGNOSIS — Z1231 Encounter for screening mammogram for malignant neoplasm of breast: Secondary | ICD-10-CM | POA: Diagnosis not present

## 2017-08-20 DIAGNOSIS — Z124 Encounter for screening for malignant neoplasm of cervix: Secondary | ICD-10-CM | POA: Diagnosis not present

## 2017-08-20 DIAGNOSIS — D509 Iron deficiency anemia, unspecified: Secondary | ICD-10-CM | POA: Diagnosis not present

## 2017-08-20 DIAGNOSIS — N186 End stage renal disease: Secondary | ICD-10-CM | POA: Diagnosis not present

## 2017-08-20 DIAGNOSIS — D631 Anemia in chronic kidney disease: Secondary | ICD-10-CM | POA: Diagnosis not present

## 2017-08-20 DIAGNOSIS — Z79899 Other long term (current) drug therapy: Secondary | ICD-10-CM | POA: Diagnosis not present

## 2017-08-20 DIAGNOSIS — N2581 Secondary hyperparathyroidism of renal origin: Secondary | ICD-10-CM | POA: Diagnosis not present

## 2017-08-21 DIAGNOSIS — Z79899 Other long term (current) drug therapy: Secondary | ICD-10-CM | POA: Diagnosis not present

## 2017-08-21 DIAGNOSIS — E440 Moderate protein-calorie malnutrition: Secondary | ICD-10-CM | POA: Diagnosis not present

## 2017-08-21 DIAGNOSIS — D509 Iron deficiency anemia, unspecified: Secondary | ICD-10-CM | POA: Diagnosis not present

## 2017-08-21 DIAGNOSIS — D631 Anemia in chronic kidney disease: Secondary | ICD-10-CM | POA: Diagnosis not present

## 2017-08-21 DIAGNOSIS — N186 End stage renal disease: Secondary | ICD-10-CM | POA: Diagnosis not present

## 2017-08-21 DIAGNOSIS — N2581 Secondary hyperparathyroidism of renal origin: Secondary | ICD-10-CM | POA: Diagnosis not present

## 2017-08-21 LAB — HM PAP SMEAR: HM Pap smear: NORMAL

## 2017-08-22 DIAGNOSIS — D509 Iron deficiency anemia, unspecified: Secondary | ICD-10-CM | POA: Diagnosis not present

## 2017-08-22 DIAGNOSIS — Z79899 Other long term (current) drug therapy: Secondary | ICD-10-CM | POA: Diagnosis not present

## 2017-08-22 DIAGNOSIS — N2581 Secondary hyperparathyroidism of renal origin: Secondary | ICD-10-CM | POA: Diagnosis not present

## 2017-08-22 DIAGNOSIS — D631 Anemia in chronic kidney disease: Secondary | ICD-10-CM | POA: Diagnosis not present

## 2017-08-22 DIAGNOSIS — E440 Moderate protein-calorie malnutrition: Secondary | ICD-10-CM | POA: Diagnosis not present

## 2017-08-22 DIAGNOSIS — N186 End stage renal disease: Secondary | ICD-10-CM | POA: Diagnosis not present

## 2017-08-23 ENCOUNTER — Other Ambulatory Visit: Payer: Self-pay | Admitting: Obstetrics & Gynecology

## 2017-08-23 DIAGNOSIS — D631 Anemia in chronic kidney disease: Secondary | ICD-10-CM | POA: Diagnosis not present

## 2017-08-23 DIAGNOSIS — D509 Iron deficiency anemia, unspecified: Secondary | ICD-10-CM | POA: Diagnosis not present

## 2017-08-23 DIAGNOSIS — N186 End stage renal disease: Secondary | ICD-10-CM | POA: Diagnosis not present

## 2017-08-23 DIAGNOSIS — E440 Moderate protein-calorie malnutrition: Secondary | ICD-10-CM | POA: Diagnosis not present

## 2017-08-23 DIAGNOSIS — N2581 Secondary hyperparathyroidism of renal origin: Secondary | ICD-10-CM | POA: Diagnosis not present

## 2017-08-23 DIAGNOSIS — Z79899 Other long term (current) drug therapy: Secondary | ICD-10-CM | POA: Diagnosis not present

## 2017-08-23 DIAGNOSIS — R928 Other abnormal and inconclusive findings on diagnostic imaging of breast: Secondary | ICD-10-CM

## 2017-08-24 DIAGNOSIS — D631 Anemia in chronic kidney disease: Secondary | ICD-10-CM | POA: Diagnosis not present

## 2017-08-24 DIAGNOSIS — N2581 Secondary hyperparathyroidism of renal origin: Secondary | ICD-10-CM | POA: Diagnosis not present

## 2017-08-24 DIAGNOSIS — N186 End stage renal disease: Secondary | ICD-10-CM | POA: Diagnosis not present

## 2017-08-24 DIAGNOSIS — D509 Iron deficiency anemia, unspecified: Secondary | ICD-10-CM | POA: Diagnosis not present

## 2017-08-24 DIAGNOSIS — Z79899 Other long term (current) drug therapy: Secondary | ICD-10-CM | POA: Diagnosis not present

## 2017-08-24 DIAGNOSIS — E440 Moderate protein-calorie malnutrition: Secondary | ICD-10-CM | POA: Diagnosis not present

## 2017-08-25 DIAGNOSIS — N2581 Secondary hyperparathyroidism of renal origin: Secondary | ICD-10-CM | POA: Diagnosis not present

## 2017-08-25 DIAGNOSIS — N186 End stage renal disease: Secondary | ICD-10-CM | POA: Diagnosis not present

## 2017-08-25 DIAGNOSIS — E440 Moderate protein-calorie malnutrition: Secondary | ICD-10-CM | POA: Diagnosis not present

## 2017-08-25 DIAGNOSIS — D509 Iron deficiency anemia, unspecified: Secondary | ICD-10-CM | POA: Diagnosis not present

## 2017-08-25 DIAGNOSIS — Z79899 Other long term (current) drug therapy: Secondary | ICD-10-CM | POA: Diagnosis not present

## 2017-08-25 DIAGNOSIS — D631 Anemia in chronic kidney disease: Secondary | ICD-10-CM | POA: Diagnosis not present

## 2017-08-26 DIAGNOSIS — Z79899 Other long term (current) drug therapy: Secondary | ICD-10-CM | POA: Diagnosis not present

## 2017-08-26 DIAGNOSIS — N2581 Secondary hyperparathyroidism of renal origin: Secondary | ICD-10-CM | POA: Diagnosis not present

## 2017-08-26 DIAGNOSIS — N186 End stage renal disease: Secondary | ICD-10-CM | POA: Diagnosis not present

## 2017-08-26 DIAGNOSIS — D631 Anemia in chronic kidney disease: Secondary | ICD-10-CM | POA: Diagnosis not present

## 2017-08-26 DIAGNOSIS — E440 Moderate protein-calorie malnutrition: Secondary | ICD-10-CM | POA: Diagnosis not present

## 2017-08-26 DIAGNOSIS — D509 Iron deficiency anemia, unspecified: Secondary | ICD-10-CM | POA: Diagnosis not present

## 2017-08-27 DIAGNOSIS — N2581 Secondary hyperparathyroidism of renal origin: Secondary | ICD-10-CM | POA: Diagnosis not present

## 2017-08-27 DIAGNOSIS — E440 Moderate protein-calorie malnutrition: Secondary | ICD-10-CM | POA: Diagnosis not present

## 2017-08-27 DIAGNOSIS — Z79899 Other long term (current) drug therapy: Secondary | ICD-10-CM | POA: Diagnosis not present

## 2017-08-27 DIAGNOSIS — N186 End stage renal disease: Secondary | ICD-10-CM | POA: Diagnosis not present

## 2017-08-27 DIAGNOSIS — D631 Anemia in chronic kidney disease: Secondary | ICD-10-CM | POA: Diagnosis not present

## 2017-08-27 DIAGNOSIS — D509 Iron deficiency anemia, unspecified: Secondary | ICD-10-CM | POA: Diagnosis not present

## 2017-08-28 DIAGNOSIS — Z79899 Other long term (current) drug therapy: Secondary | ICD-10-CM | POA: Diagnosis not present

## 2017-08-28 DIAGNOSIS — E440 Moderate protein-calorie malnutrition: Secondary | ICD-10-CM | POA: Diagnosis not present

## 2017-08-28 DIAGNOSIS — N186 End stage renal disease: Secondary | ICD-10-CM | POA: Diagnosis not present

## 2017-08-28 DIAGNOSIS — D509 Iron deficiency anemia, unspecified: Secondary | ICD-10-CM | POA: Diagnosis not present

## 2017-08-28 DIAGNOSIS — D631 Anemia in chronic kidney disease: Secondary | ICD-10-CM | POA: Diagnosis not present

## 2017-08-28 DIAGNOSIS — N2581 Secondary hyperparathyroidism of renal origin: Secondary | ICD-10-CM | POA: Diagnosis not present

## 2017-08-29 DIAGNOSIS — E440 Moderate protein-calorie malnutrition: Secondary | ICD-10-CM | POA: Diagnosis not present

## 2017-08-29 DIAGNOSIS — N186 End stage renal disease: Secondary | ICD-10-CM | POA: Diagnosis not present

## 2017-08-29 DIAGNOSIS — D509 Iron deficiency anemia, unspecified: Secondary | ICD-10-CM | POA: Diagnosis not present

## 2017-08-29 DIAGNOSIS — D631 Anemia in chronic kidney disease: Secondary | ICD-10-CM | POA: Diagnosis not present

## 2017-08-29 DIAGNOSIS — Z79899 Other long term (current) drug therapy: Secondary | ICD-10-CM | POA: Diagnosis not present

## 2017-08-29 DIAGNOSIS — N2581 Secondary hyperparathyroidism of renal origin: Secondary | ICD-10-CM | POA: Diagnosis not present

## 2017-08-30 DIAGNOSIS — E440 Moderate protein-calorie malnutrition: Secondary | ICD-10-CM | POA: Diagnosis not present

## 2017-08-30 DIAGNOSIS — D631 Anemia in chronic kidney disease: Secondary | ICD-10-CM | POA: Diagnosis not present

## 2017-08-30 DIAGNOSIS — N186 End stage renal disease: Secondary | ICD-10-CM | POA: Diagnosis not present

## 2017-08-30 DIAGNOSIS — Z79899 Other long term (current) drug therapy: Secondary | ICD-10-CM | POA: Diagnosis not present

## 2017-08-30 DIAGNOSIS — D509 Iron deficiency anemia, unspecified: Secondary | ICD-10-CM | POA: Diagnosis not present

## 2017-08-30 DIAGNOSIS — N2581 Secondary hyperparathyroidism of renal origin: Secondary | ICD-10-CM | POA: Diagnosis not present

## 2017-08-31 ENCOUNTER — Ambulatory Visit
Admission: RE | Admit: 2017-08-31 | Discharge: 2017-08-31 | Disposition: A | Discharge status: Home / Self Care | Payer: MEDICARE | Source: Ambulatory Visit | Attending: Obstetrics & Gynecology | Admitting: Obstetrics & Gynecology

## 2017-08-31 ENCOUNTER — Ambulatory Visit: Payer: MEDICARE

## 2017-08-31 DIAGNOSIS — E440 Moderate protein-calorie malnutrition: Secondary | ICD-10-CM | POA: Diagnosis not present

## 2017-08-31 DIAGNOSIS — R922 Inconclusive mammogram: Secondary | ICD-10-CM | POA: Diagnosis not present

## 2017-08-31 DIAGNOSIS — Z79899 Other long term (current) drug therapy: Secondary | ICD-10-CM | POA: Diagnosis not present

## 2017-08-31 DIAGNOSIS — D509 Iron deficiency anemia, unspecified: Secondary | ICD-10-CM | POA: Diagnosis not present

## 2017-08-31 DIAGNOSIS — N186 End stage renal disease: Secondary | ICD-10-CM | POA: Diagnosis not present

## 2017-08-31 DIAGNOSIS — N2581 Secondary hyperparathyroidism of renal origin: Secondary | ICD-10-CM | POA: Diagnosis not present

## 2017-08-31 DIAGNOSIS — R928 Other abnormal and inconclusive findings on diagnostic imaging of breast: Secondary | ICD-10-CM

## 2017-08-31 DIAGNOSIS — D631 Anemia in chronic kidney disease: Secondary | ICD-10-CM | POA: Diagnosis not present

## 2017-09-01 DIAGNOSIS — Z79899 Other long term (current) drug therapy: Secondary | ICD-10-CM | POA: Diagnosis not present

## 2017-09-01 DIAGNOSIS — N2581 Secondary hyperparathyroidism of renal origin: Secondary | ICD-10-CM | POA: Diagnosis not present

## 2017-09-01 DIAGNOSIS — E440 Moderate protein-calorie malnutrition: Secondary | ICD-10-CM | POA: Diagnosis not present

## 2017-09-01 DIAGNOSIS — D509 Iron deficiency anemia, unspecified: Secondary | ICD-10-CM | POA: Diagnosis not present

## 2017-09-01 DIAGNOSIS — D631 Anemia in chronic kidney disease: Secondary | ICD-10-CM | POA: Diagnosis not present

## 2017-09-01 DIAGNOSIS — N186 End stage renal disease: Secondary | ICD-10-CM | POA: Diagnosis not present

## 2017-09-02 DIAGNOSIS — N186 End stage renal disease: Secondary | ICD-10-CM | POA: Diagnosis not present

## 2017-09-02 DIAGNOSIS — Z79899 Other long term (current) drug therapy: Secondary | ICD-10-CM | POA: Diagnosis not present

## 2017-09-02 DIAGNOSIS — D631 Anemia in chronic kidney disease: Secondary | ICD-10-CM | POA: Diagnosis not present

## 2017-09-02 DIAGNOSIS — E440 Moderate protein-calorie malnutrition: Secondary | ICD-10-CM | POA: Diagnosis not present

## 2017-09-02 DIAGNOSIS — D509 Iron deficiency anemia, unspecified: Secondary | ICD-10-CM | POA: Diagnosis not present

## 2017-09-02 DIAGNOSIS — N2581 Secondary hyperparathyroidism of renal origin: Secondary | ICD-10-CM | POA: Diagnosis not present

## 2017-09-03 DIAGNOSIS — D631 Anemia in chronic kidney disease: Secondary | ICD-10-CM | POA: Diagnosis not present

## 2017-09-03 DIAGNOSIS — Z992 Dependence on renal dialysis: Secondary | ICD-10-CM | POA: Diagnosis not present

## 2017-09-03 DIAGNOSIS — Z79899 Other long term (current) drug therapy: Secondary | ICD-10-CM | POA: Diagnosis not present

## 2017-09-03 DIAGNOSIS — N2889 Other specified disorders of kidney and ureter: Secondary | ICD-10-CM | POA: Diagnosis not present

## 2017-09-03 DIAGNOSIS — N2581 Secondary hyperparathyroidism of renal origin: Secondary | ICD-10-CM | POA: Diagnosis not present

## 2017-09-03 DIAGNOSIS — E440 Moderate protein-calorie malnutrition: Secondary | ICD-10-CM | POA: Diagnosis not present

## 2017-09-03 DIAGNOSIS — N186 End stage renal disease: Secondary | ICD-10-CM | POA: Diagnosis not present

## 2017-09-03 DIAGNOSIS — D509 Iron deficiency anemia, unspecified: Secondary | ICD-10-CM | POA: Diagnosis not present

## 2017-09-04 DIAGNOSIS — D631 Anemia in chronic kidney disease: Secondary | ICD-10-CM | POA: Diagnosis not present

## 2017-09-04 DIAGNOSIS — Z4932 Encounter for adequacy testing for peritoneal dialysis: Secondary | ICD-10-CM | POA: Diagnosis not present

## 2017-09-04 DIAGNOSIS — N186 End stage renal disease: Secondary | ICD-10-CM | POA: Diagnosis not present

## 2017-09-04 DIAGNOSIS — N2581 Secondary hyperparathyroidism of renal origin: Secondary | ICD-10-CM | POA: Diagnosis not present

## 2017-09-04 DIAGNOSIS — N2589 Other disorders resulting from impaired renal tubular function: Secondary | ICD-10-CM | POA: Diagnosis not present

## 2017-09-04 DIAGNOSIS — K769 Liver disease, unspecified: Secondary | ICD-10-CM | POA: Diagnosis not present

## 2017-09-04 DIAGNOSIS — D509 Iron deficiency anemia, unspecified: Secondary | ICD-10-CM | POA: Diagnosis not present

## 2017-09-05 DIAGNOSIS — Z4932 Encounter for adequacy testing for peritoneal dialysis: Secondary | ICD-10-CM | POA: Diagnosis not present

## 2017-09-05 DIAGNOSIS — D631 Anemia in chronic kidney disease: Secondary | ICD-10-CM | POA: Diagnosis not present

## 2017-09-05 DIAGNOSIS — N186 End stage renal disease: Secondary | ICD-10-CM | POA: Diagnosis not present

## 2017-09-05 DIAGNOSIS — R82998 Other abnormal findings in urine: Secondary | ICD-10-CM | POA: Diagnosis not present

## 2017-09-05 DIAGNOSIS — E7849 Other hyperlipidemia: Secondary | ICD-10-CM | POA: Diagnosis not present

## 2017-09-05 DIAGNOSIS — N2581 Secondary hyperparathyroidism of renal origin: Secondary | ICD-10-CM | POA: Diagnosis not present

## 2017-09-05 DIAGNOSIS — K769 Liver disease, unspecified: Secondary | ICD-10-CM | POA: Diagnosis not present

## 2017-09-05 DIAGNOSIS — D509 Iron deficiency anemia, unspecified: Secondary | ICD-10-CM | POA: Diagnosis not present

## 2017-09-06 DIAGNOSIS — D631 Anemia in chronic kidney disease: Secondary | ICD-10-CM | POA: Diagnosis not present

## 2017-09-06 DIAGNOSIS — K769 Liver disease, unspecified: Secondary | ICD-10-CM | POA: Diagnosis not present

## 2017-09-06 DIAGNOSIS — N186 End stage renal disease: Secondary | ICD-10-CM | POA: Diagnosis not present

## 2017-09-06 DIAGNOSIS — Z4932 Encounter for adequacy testing for peritoneal dialysis: Secondary | ICD-10-CM | POA: Diagnosis not present

## 2017-09-06 DIAGNOSIS — D509 Iron deficiency anemia, unspecified: Secondary | ICD-10-CM | POA: Diagnosis not present

## 2017-09-06 DIAGNOSIS — E039 Hypothyroidism, unspecified: Secondary | ICD-10-CM | POA: Diagnosis not present

## 2017-09-06 DIAGNOSIS — N2581 Secondary hyperparathyroidism of renal origin: Secondary | ICD-10-CM | POA: Diagnosis not present

## 2017-09-07 DIAGNOSIS — N186 End stage renal disease: Secondary | ICD-10-CM | POA: Diagnosis not present

## 2017-09-07 DIAGNOSIS — D509 Iron deficiency anemia, unspecified: Secondary | ICD-10-CM | POA: Diagnosis not present

## 2017-09-07 DIAGNOSIS — Z4932 Encounter for adequacy testing for peritoneal dialysis: Secondary | ICD-10-CM | POA: Diagnosis not present

## 2017-09-07 DIAGNOSIS — N2581 Secondary hyperparathyroidism of renal origin: Secondary | ICD-10-CM | POA: Diagnosis not present

## 2017-09-07 DIAGNOSIS — K769 Liver disease, unspecified: Secondary | ICD-10-CM | POA: Diagnosis not present

## 2017-09-07 DIAGNOSIS — D631 Anemia in chronic kidney disease: Secondary | ICD-10-CM | POA: Diagnosis not present

## 2017-09-08 DIAGNOSIS — K769 Liver disease, unspecified: Secondary | ICD-10-CM | POA: Diagnosis not present

## 2017-09-08 DIAGNOSIS — D631 Anemia in chronic kidney disease: Secondary | ICD-10-CM | POA: Diagnosis not present

## 2017-09-08 DIAGNOSIS — N2581 Secondary hyperparathyroidism of renal origin: Secondary | ICD-10-CM | POA: Diagnosis not present

## 2017-09-08 DIAGNOSIS — D509 Iron deficiency anemia, unspecified: Secondary | ICD-10-CM | POA: Diagnosis not present

## 2017-09-08 DIAGNOSIS — N186 End stage renal disease: Secondary | ICD-10-CM | POA: Diagnosis not present

## 2017-09-08 DIAGNOSIS — Z4932 Encounter for adequacy testing for peritoneal dialysis: Secondary | ICD-10-CM | POA: Diagnosis not present

## 2017-09-09 DIAGNOSIS — N186 End stage renal disease: Secondary | ICD-10-CM | POA: Diagnosis not present

## 2017-09-09 DIAGNOSIS — K769 Liver disease, unspecified: Secondary | ICD-10-CM | POA: Diagnosis not present

## 2017-09-09 DIAGNOSIS — D509 Iron deficiency anemia, unspecified: Secondary | ICD-10-CM | POA: Diagnosis not present

## 2017-09-09 DIAGNOSIS — Z4932 Encounter for adequacy testing for peritoneal dialysis: Secondary | ICD-10-CM | POA: Diagnosis not present

## 2017-09-09 DIAGNOSIS — D631 Anemia in chronic kidney disease: Secondary | ICD-10-CM | POA: Diagnosis not present

## 2017-09-09 DIAGNOSIS — N2581 Secondary hyperparathyroidism of renal origin: Secondary | ICD-10-CM | POA: Diagnosis not present

## 2017-09-10 DIAGNOSIS — D509 Iron deficiency anemia, unspecified: Secondary | ICD-10-CM | POA: Diagnosis not present

## 2017-09-10 DIAGNOSIS — Z4932 Encounter for adequacy testing for peritoneal dialysis: Secondary | ICD-10-CM | POA: Diagnosis not present

## 2017-09-10 DIAGNOSIS — D631 Anemia in chronic kidney disease: Secondary | ICD-10-CM | POA: Diagnosis not present

## 2017-09-10 DIAGNOSIS — N2581 Secondary hyperparathyroidism of renal origin: Secondary | ICD-10-CM | POA: Diagnosis not present

## 2017-09-10 DIAGNOSIS — N186 End stage renal disease: Secondary | ICD-10-CM | POA: Diagnosis not present

## 2017-09-10 DIAGNOSIS — K769 Liver disease, unspecified: Secondary | ICD-10-CM | POA: Diagnosis not present

## 2017-09-11 DIAGNOSIS — D509 Iron deficiency anemia, unspecified: Secondary | ICD-10-CM | POA: Diagnosis not present

## 2017-09-11 DIAGNOSIS — N186 End stage renal disease: Secondary | ICD-10-CM | POA: Diagnosis not present

## 2017-09-11 DIAGNOSIS — N2581 Secondary hyperparathyroidism of renal origin: Secondary | ICD-10-CM | POA: Diagnosis not present

## 2017-09-11 DIAGNOSIS — D631 Anemia in chronic kidney disease: Secondary | ICD-10-CM | POA: Diagnosis not present

## 2017-09-11 DIAGNOSIS — Z4932 Encounter for adequacy testing for peritoneal dialysis: Secondary | ICD-10-CM | POA: Diagnosis not present

## 2017-09-11 DIAGNOSIS — K769 Liver disease, unspecified: Secondary | ICD-10-CM | POA: Diagnosis not present

## 2017-09-12 DIAGNOSIS — D509 Iron deficiency anemia, unspecified: Secondary | ICD-10-CM | POA: Diagnosis not present

## 2017-09-12 DIAGNOSIS — N186 End stage renal disease: Secondary | ICD-10-CM | POA: Diagnosis not present

## 2017-09-12 DIAGNOSIS — K769 Liver disease, unspecified: Secondary | ICD-10-CM | POA: Diagnosis not present

## 2017-09-12 DIAGNOSIS — N2581 Secondary hyperparathyroidism of renal origin: Secondary | ICD-10-CM | POA: Diagnosis not present

## 2017-09-12 DIAGNOSIS — Z4932 Encounter for adequacy testing for peritoneal dialysis: Secondary | ICD-10-CM | POA: Diagnosis not present

## 2017-09-12 DIAGNOSIS — D631 Anemia in chronic kidney disease: Secondary | ICD-10-CM | POA: Diagnosis not present

## 2017-09-13 DIAGNOSIS — N186 End stage renal disease: Secondary | ICD-10-CM | POA: Diagnosis not present

## 2017-09-13 DIAGNOSIS — N2581 Secondary hyperparathyroidism of renal origin: Secondary | ICD-10-CM | POA: Diagnosis not present

## 2017-09-13 DIAGNOSIS — D631 Anemia in chronic kidney disease: Secondary | ICD-10-CM | POA: Diagnosis not present

## 2017-09-13 DIAGNOSIS — K769 Liver disease, unspecified: Secondary | ICD-10-CM | POA: Diagnosis not present

## 2017-09-13 DIAGNOSIS — Z4932 Encounter for adequacy testing for peritoneal dialysis: Secondary | ICD-10-CM | POA: Diagnosis not present

## 2017-09-13 DIAGNOSIS — D509 Iron deficiency anemia, unspecified: Secondary | ICD-10-CM | POA: Diagnosis not present

## 2017-09-14 DIAGNOSIS — K769 Liver disease, unspecified: Secondary | ICD-10-CM | POA: Diagnosis not present

## 2017-09-14 DIAGNOSIS — N186 End stage renal disease: Secondary | ICD-10-CM | POA: Diagnosis not present

## 2017-09-14 DIAGNOSIS — Z4932 Encounter for adequacy testing for peritoneal dialysis: Secondary | ICD-10-CM | POA: Diagnosis not present

## 2017-09-14 DIAGNOSIS — D631 Anemia in chronic kidney disease: Secondary | ICD-10-CM | POA: Diagnosis not present

## 2017-09-14 DIAGNOSIS — N2581 Secondary hyperparathyroidism of renal origin: Secondary | ICD-10-CM | POA: Diagnosis not present

## 2017-09-14 DIAGNOSIS — D509 Iron deficiency anemia, unspecified: Secondary | ICD-10-CM | POA: Diagnosis not present

## 2017-09-15 DIAGNOSIS — Z4932 Encounter for adequacy testing for peritoneal dialysis: Secondary | ICD-10-CM | POA: Diagnosis not present

## 2017-09-15 DIAGNOSIS — D509 Iron deficiency anemia, unspecified: Secondary | ICD-10-CM | POA: Diagnosis not present

## 2017-09-15 DIAGNOSIS — K769 Liver disease, unspecified: Secondary | ICD-10-CM | POA: Diagnosis not present

## 2017-09-15 DIAGNOSIS — N186 End stage renal disease: Secondary | ICD-10-CM | POA: Diagnosis not present

## 2017-09-15 DIAGNOSIS — D631 Anemia in chronic kidney disease: Secondary | ICD-10-CM | POA: Diagnosis not present

## 2017-09-15 DIAGNOSIS — N2581 Secondary hyperparathyroidism of renal origin: Secondary | ICD-10-CM | POA: Diagnosis not present

## 2017-09-16 DIAGNOSIS — D509 Iron deficiency anemia, unspecified: Secondary | ICD-10-CM | POA: Diagnosis not present

## 2017-09-16 DIAGNOSIS — Z4932 Encounter for adequacy testing for peritoneal dialysis: Secondary | ICD-10-CM | POA: Diagnosis not present

## 2017-09-16 DIAGNOSIS — N186 End stage renal disease: Secondary | ICD-10-CM | POA: Diagnosis not present

## 2017-09-16 DIAGNOSIS — K769 Liver disease, unspecified: Secondary | ICD-10-CM | POA: Diagnosis not present

## 2017-09-16 DIAGNOSIS — N2581 Secondary hyperparathyroidism of renal origin: Secondary | ICD-10-CM | POA: Diagnosis not present

## 2017-09-16 DIAGNOSIS — D631 Anemia in chronic kidney disease: Secondary | ICD-10-CM | POA: Diagnosis not present

## 2017-09-17 DIAGNOSIS — D509 Iron deficiency anemia, unspecified: Secondary | ICD-10-CM | POA: Diagnosis not present

## 2017-09-17 DIAGNOSIS — K769 Liver disease, unspecified: Secondary | ICD-10-CM | POA: Diagnosis not present

## 2017-09-17 DIAGNOSIS — D631 Anemia in chronic kidney disease: Secondary | ICD-10-CM | POA: Diagnosis not present

## 2017-09-17 DIAGNOSIS — Z4932 Encounter for adequacy testing for peritoneal dialysis: Secondary | ICD-10-CM | POA: Diagnosis not present

## 2017-09-17 DIAGNOSIS — N2581 Secondary hyperparathyroidism of renal origin: Secondary | ICD-10-CM | POA: Diagnosis not present

## 2017-09-17 DIAGNOSIS — N186 End stage renal disease: Secondary | ICD-10-CM | POA: Diagnosis not present

## 2017-09-18 DIAGNOSIS — N2581 Secondary hyperparathyroidism of renal origin: Secondary | ICD-10-CM | POA: Diagnosis not present

## 2017-09-18 DIAGNOSIS — D631 Anemia in chronic kidney disease: Secondary | ICD-10-CM | POA: Diagnosis not present

## 2017-09-18 DIAGNOSIS — N186 End stage renal disease: Secondary | ICD-10-CM | POA: Diagnosis not present

## 2017-09-18 DIAGNOSIS — K769 Liver disease, unspecified: Secondary | ICD-10-CM | POA: Diagnosis not present

## 2017-09-18 DIAGNOSIS — Z4932 Encounter for adequacy testing for peritoneal dialysis: Secondary | ICD-10-CM | POA: Diagnosis not present

## 2017-09-18 DIAGNOSIS — D509 Iron deficiency anemia, unspecified: Secondary | ICD-10-CM | POA: Diagnosis not present

## 2017-09-19 DIAGNOSIS — Z4932 Encounter for adequacy testing for peritoneal dialysis: Secondary | ICD-10-CM | POA: Diagnosis not present

## 2017-09-19 DIAGNOSIS — N2581 Secondary hyperparathyroidism of renal origin: Secondary | ICD-10-CM | POA: Diagnosis not present

## 2017-09-19 DIAGNOSIS — K769 Liver disease, unspecified: Secondary | ICD-10-CM | POA: Diagnosis not present

## 2017-09-19 DIAGNOSIS — N186 End stage renal disease: Secondary | ICD-10-CM | POA: Diagnosis not present

## 2017-09-19 DIAGNOSIS — D509 Iron deficiency anemia, unspecified: Secondary | ICD-10-CM | POA: Diagnosis not present

## 2017-09-19 DIAGNOSIS — D631 Anemia in chronic kidney disease: Secondary | ICD-10-CM | POA: Diagnosis not present

## 2017-09-20 DIAGNOSIS — K769 Liver disease, unspecified: Secondary | ICD-10-CM | POA: Diagnosis not present

## 2017-09-20 DIAGNOSIS — N186 End stage renal disease: Secondary | ICD-10-CM | POA: Diagnosis not present

## 2017-09-20 DIAGNOSIS — D509 Iron deficiency anemia, unspecified: Secondary | ICD-10-CM | POA: Diagnosis not present

## 2017-09-20 DIAGNOSIS — D631 Anemia in chronic kidney disease: Secondary | ICD-10-CM | POA: Diagnosis not present

## 2017-09-20 DIAGNOSIS — Z4932 Encounter for adequacy testing for peritoneal dialysis: Secondary | ICD-10-CM | POA: Diagnosis not present

## 2017-09-20 DIAGNOSIS — N2581 Secondary hyperparathyroidism of renal origin: Secondary | ICD-10-CM | POA: Diagnosis not present

## 2017-09-21 DIAGNOSIS — K769 Liver disease, unspecified: Secondary | ICD-10-CM | POA: Diagnosis not present

## 2017-09-21 DIAGNOSIS — N186 End stage renal disease: Secondary | ICD-10-CM | POA: Diagnosis not present

## 2017-09-21 DIAGNOSIS — D631 Anemia in chronic kidney disease: Secondary | ICD-10-CM | POA: Diagnosis not present

## 2017-09-21 DIAGNOSIS — Z4932 Encounter for adequacy testing for peritoneal dialysis: Secondary | ICD-10-CM | POA: Diagnosis not present

## 2017-09-21 DIAGNOSIS — N2581 Secondary hyperparathyroidism of renal origin: Secondary | ICD-10-CM | POA: Diagnosis not present

## 2017-09-21 DIAGNOSIS — D509 Iron deficiency anemia, unspecified: Secondary | ICD-10-CM | POA: Diagnosis not present

## 2017-09-22 DIAGNOSIS — D631 Anemia in chronic kidney disease: Secondary | ICD-10-CM | POA: Diagnosis not present

## 2017-09-22 DIAGNOSIS — N2581 Secondary hyperparathyroidism of renal origin: Secondary | ICD-10-CM | POA: Diagnosis not present

## 2017-09-22 DIAGNOSIS — Z4932 Encounter for adequacy testing for peritoneal dialysis: Secondary | ICD-10-CM | POA: Diagnosis not present

## 2017-09-22 DIAGNOSIS — D509 Iron deficiency anemia, unspecified: Secondary | ICD-10-CM | POA: Diagnosis not present

## 2017-09-22 DIAGNOSIS — N186 End stage renal disease: Secondary | ICD-10-CM | POA: Diagnosis not present

## 2017-09-22 DIAGNOSIS — K769 Liver disease, unspecified: Secondary | ICD-10-CM | POA: Diagnosis not present

## 2017-09-23 DIAGNOSIS — N186 End stage renal disease: Secondary | ICD-10-CM | POA: Diagnosis not present

## 2017-09-23 DIAGNOSIS — D509 Iron deficiency anemia, unspecified: Secondary | ICD-10-CM | POA: Diagnosis not present

## 2017-09-23 DIAGNOSIS — D631 Anemia in chronic kidney disease: Secondary | ICD-10-CM | POA: Diagnosis not present

## 2017-09-23 DIAGNOSIS — Z4932 Encounter for adequacy testing for peritoneal dialysis: Secondary | ICD-10-CM | POA: Diagnosis not present

## 2017-09-23 DIAGNOSIS — K769 Liver disease, unspecified: Secondary | ICD-10-CM | POA: Diagnosis not present

## 2017-09-23 DIAGNOSIS — N2581 Secondary hyperparathyroidism of renal origin: Secondary | ICD-10-CM | POA: Diagnosis not present

## 2017-09-24 DIAGNOSIS — D509 Iron deficiency anemia, unspecified: Secondary | ICD-10-CM | POA: Diagnosis not present

## 2017-09-24 DIAGNOSIS — N2581 Secondary hyperparathyroidism of renal origin: Secondary | ICD-10-CM | POA: Diagnosis not present

## 2017-09-24 DIAGNOSIS — D631 Anemia in chronic kidney disease: Secondary | ICD-10-CM | POA: Diagnosis not present

## 2017-09-24 DIAGNOSIS — N186 End stage renal disease: Secondary | ICD-10-CM | POA: Diagnosis not present

## 2017-09-24 DIAGNOSIS — Z4932 Encounter for adequacy testing for peritoneal dialysis: Secondary | ICD-10-CM | POA: Diagnosis not present

## 2017-09-24 DIAGNOSIS — K769 Liver disease, unspecified: Secondary | ICD-10-CM | POA: Diagnosis not present

## 2017-09-25 DIAGNOSIS — K769 Liver disease, unspecified: Secondary | ICD-10-CM | POA: Diagnosis not present

## 2017-09-25 DIAGNOSIS — Z4932 Encounter for adequacy testing for peritoneal dialysis: Secondary | ICD-10-CM | POA: Diagnosis not present

## 2017-09-25 DIAGNOSIS — N186 End stage renal disease: Secondary | ICD-10-CM | POA: Diagnosis not present

## 2017-09-25 DIAGNOSIS — N2581 Secondary hyperparathyroidism of renal origin: Secondary | ICD-10-CM | POA: Diagnosis not present

## 2017-09-25 DIAGNOSIS — D631 Anemia in chronic kidney disease: Secondary | ICD-10-CM | POA: Diagnosis not present

## 2017-09-25 DIAGNOSIS — D509 Iron deficiency anemia, unspecified: Secondary | ICD-10-CM | POA: Diagnosis not present

## 2017-09-26 DIAGNOSIS — N2581 Secondary hyperparathyroidism of renal origin: Secondary | ICD-10-CM | POA: Diagnosis not present

## 2017-09-26 DIAGNOSIS — N186 End stage renal disease: Secondary | ICD-10-CM | POA: Diagnosis not present

## 2017-09-26 DIAGNOSIS — D631 Anemia in chronic kidney disease: Secondary | ICD-10-CM | POA: Diagnosis not present

## 2017-09-26 DIAGNOSIS — D509 Iron deficiency anemia, unspecified: Secondary | ICD-10-CM | POA: Diagnosis not present

## 2017-09-26 DIAGNOSIS — K769 Liver disease, unspecified: Secondary | ICD-10-CM | POA: Diagnosis not present

## 2017-09-26 DIAGNOSIS — Z4932 Encounter for adequacy testing for peritoneal dialysis: Secondary | ICD-10-CM | POA: Diagnosis not present

## 2017-09-27 DIAGNOSIS — Z4932 Encounter for adequacy testing for peritoneal dialysis: Secondary | ICD-10-CM | POA: Diagnosis not present

## 2017-09-27 DIAGNOSIS — D509 Iron deficiency anemia, unspecified: Secondary | ICD-10-CM | POA: Diagnosis not present

## 2017-09-27 DIAGNOSIS — N186 End stage renal disease: Secondary | ICD-10-CM | POA: Diagnosis not present

## 2017-09-27 DIAGNOSIS — K769 Liver disease, unspecified: Secondary | ICD-10-CM | POA: Diagnosis not present

## 2017-09-27 DIAGNOSIS — D631 Anemia in chronic kidney disease: Secondary | ICD-10-CM | POA: Diagnosis not present

## 2017-09-27 DIAGNOSIS — N2581 Secondary hyperparathyroidism of renal origin: Secondary | ICD-10-CM | POA: Diagnosis not present

## 2017-09-28 DIAGNOSIS — K769 Liver disease, unspecified: Secondary | ICD-10-CM | POA: Diagnosis not present

## 2017-09-28 DIAGNOSIS — Z4932 Encounter for adequacy testing for peritoneal dialysis: Secondary | ICD-10-CM | POA: Diagnosis not present

## 2017-09-28 DIAGNOSIS — N2581 Secondary hyperparathyroidism of renal origin: Secondary | ICD-10-CM | POA: Diagnosis not present

## 2017-09-28 DIAGNOSIS — D631 Anemia in chronic kidney disease: Secondary | ICD-10-CM | POA: Diagnosis not present

## 2017-09-28 DIAGNOSIS — D509 Iron deficiency anemia, unspecified: Secondary | ICD-10-CM | POA: Diagnosis not present

## 2017-09-28 DIAGNOSIS — N186 End stage renal disease: Secondary | ICD-10-CM | POA: Diagnosis not present

## 2017-09-29 DIAGNOSIS — D509 Iron deficiency anemia, unspecified: Secondary | ICD-10-CM | POA: Diagnosis not present

## 2017-09-29 DIAGNOSIS — N186 End stage renal disease: Secondary | ICD-10-CM | POA: Diagnosis not present

## 2017-09-29 DIAGNOSIS — Z4932 Encounter for adequacy testing for peritoneal dialysis: Secondary | ICD-10-CM | POA: Diagnosis not present

## 2017-09-29 DIAGNOSIS — D631 Anemia in chronic kidney disease: Secondary | ICD-10-CM | POA: Diagnosis not present

## 2017-09-29 DIAGNOSIS — K769 Liver disease, unspecified: Secondary | ICD-10-CM | POA: Diagnosis not present

## 2017-09-29 DIAGNOSIS — N2581 Secondary hyperparathyroidism of renal origin: Secondary | ICD-10-CM | POA: Diagnosis not present

## 2017-09-30 DIAGNOSIS — Z4932 Encounter for adequacy testing for peritoneal dialysis: Secondary | ICD-10-CM | POA: Diagnosis not present

## 2017-09-30 DIAGNOSIS — N2581 Secondary hyperparathyroidism of renal origin: Secondary | ICD-10-CM | POA: Diagnosis not present

## 2017-09-30 DIAGNOSIS — D631 Anemia in chronic kidney disease: Secondary | ICD-10-CM | POA: Diagnosis not present

## 2017-09-30 DIAGNOSIS — N186 End stage renal disease: Secondary | ICD-10-CM | POA: Diagnosis not present

## 2017-09-30 DIAGNOSIS — D509 Iron deficiency anemia, unspecified: Secondary | ICD-10-CM | POA: Diagnosis not present

## 2017-09-30 DIAGNOSIS — K769 Liver disease, unspecified: Secondary | ICD-10-CM | POA: Diagnosis not present

## 2017-10-01 DIAGNOSIS — K769 Liver disease, unspecified: Secondary | ICD-10-CM | POA: Diagnosis not present

## 2017-10-01 DIAGNOSIS — D509 Iron deficiency anemia, unspecified: Secondary | ICD-10-CM | POA: Diagnosis not present

## 2017-10-01 DIAGNOSIS — N186 End stage renal disease: Secondary | ICD-10-CM | POA: Diagnosis not present

## 2017-10-01 DIAGNOSIS — D631 Anemia in chronic kidney disease: Secondary | ICD-10-CM | POA: Diagnosis not present

## 2017-10-01 DIAGNOSIS — N2581 Secondary hyperparathyroidism of renal origin: Secondary | ICD-10-CM | POA: Diagnosis not present

## 2017-10-01 DIAGNOSIS — Z4932 Encounter for adequacy testing for peritoneal dialysis: Secondary | ICD-10-CM | POA: Diagnosis not present

## 2017-10-02 DIAGNOSIS — N186 End stage renal disease: Secondary | ICD-10-CM | POA: Diagnosis not present

## 2017-10-02 DIAGNOSIS — D631 Anemia in chronic kidney disease: Secondary | ICD-10-CM | POA: Diagnosis not present

## 2017-10-02 DIAGNOSIS — K769 Liver disease, unspecified: Secondary | ICD-10-CM | POA: Diagnosis not present

## 2017-10-02 DIAGNOSIS — D509 Iron deficiency anemia, unspecified: Secondary | ICD-10-CM | POA: Diagnosis not present

## 2017-10-02 DIAGNOSIS — N2581 Secondary hyperparathyroidism of renal origin: Secondary | ICD-10-CM | POA: Diagnosis not present

## 2017-10-02 DIAGNOSIS — Z4932 Encounter for adequacy testing for peritoneal dialysis: Secondary | ICD-10-CM | POA: Diagnosis not present

## 2017-10-03 DIAGNOSIS — Z4932 Encounter for adequacy testing for peritoneal dialysis: Secondary | ICD-10-CM | POA: Diagnosis not present

## 2017-10-03 DIAGNOSIS — D509 Iron deficiency anemia, unspecified: Secondary | ICD-10-CM | POA: Diagnosis not present

## 2017-10-03 DIAGNOSIS — D631 Anemia in chronic kidney disease: Secondary | ICD-10-CM | POA: Diagnosis not present

## 2017-10-03 DIAGNOSIS — K769 Liver disease, unspecified: Secondary | ICD-10-CM | POA: Diagnosis not present

## 2017-10-03 DIAGNOSIS — N2581 Secondary hyperparathyroidism of renal origin: Secondary | ICD-10-CM | POA: Diagnosis not present

## 2017-10-03 DIAGNOSIS — N186 End stage renal disease: Secondary | ICD-10-CM | POA: Diagnosis not present

## 2017-10-04 DIAGNOSIS — N186 End stage renal disease: Secondary | ICD-10-CM | POA: Diagnosis not present

## 2017-10-04 DIAGNOSIS — D631 Anemia in chronic kidney disease: Secondary | ICD-10-CM | POA: Diagnosis not present

## 2017-10-04 DIAGNOSIS — Z4932 Encounter for adequacy testing for peritoneal dialysis: Secondary | ICD-10-CM | POA: Diagnosis not present

## 2017-10-04 DIAGNOSIS — D509 Iron deficiency anemia, unspecified: Secondary | ICD-10-CM | POA: Diagnosis not present

## 2017-10-04 DIAGNOSIS — Z992 Dependence on renal dialysis: Secondary | ICD-10-CM | POA: Diagnosis not present

## 2017-10-04 DIAGNOSIS — N2581 Secondary hyperparathyroidism of renal origin: Secondary | ICD-10-CM | POA: Diagnosis not present

## 2017-10-04 DIAGNOSIS — N2889 Other specified disorders of kidney and ureter: Secondary | ICD-10-CM | POA: Diagnosis not present

## 2017-10-04 DIAGNOSIS — K769 Liver disease, unspecified: Secondary | ICD-10-CM | POA: Diagnosis not present

## 2017-10-05 DIAGNOSIS — Z79899 Other long term (current) drug therapy: Secondary | ICD-10-CM | POA: Diagnosis not present

## 2017-10-05 DIAGNOSIS — N2889 Other specified disorders of kidney and ureter: Secondary | ICD-10-CM | POA: Diagnosis not present

## 2017-10-05 DIAGNOSIS — N2581 Secondary hyperparathyroidism of renal origin: Secondary | ICD-10-CM | POA: Diagnosis not present

## 2017-10-05 DIAGNOSIS — Z992 Dependence on renal dialysis: Secondary | ICD-10-CM | POA: Diagnosis not present

## 2017-10-05 DIAGNOSIS — D631 Anemia in chronic kidney disease: Secondary | ICD-10-CM | POA: Diagnosis not present

## 2017-10-05 DIAGNOSIS — N186 End stage renal disease: Secondary | ICD-10-CM | POA: Diagnosis not present

## 2017-10-05 DIAGNOSIS — E440 Moderate protein-calorie malnutrition: Secondary | ICD-10-CM | POA: Diagnosis not present

## 2017-10-05 DIAGNOSIS — D509 Iron deficiency anemia, unspecified: Secondary | ICD-10-CM | POA: Diagnosis not present

## 2017-10-06 DIAGNOSIS — D631 Anemia in chronic kidney disease: Secondary | ICD-10-CM | POA: Diagnosis not present

## 2017-10-06 DIAGNOSIS — D509 Iron deficiency anemia, unspecified: Secondary | ICD-10-CM | POA: Diagnosis not present

## 2017-10-06 DIAGNOSIS — N186 End stage renal disease: Secondary | ICD-10-CM | POA: Diagnosis not present

## 2017-10-06 DIAGNOSIS — E440 Moderate protein-calorie malnutrition: Secondary | ICD-10-CM | POA: Diagnosis not present

## 2017-10-06 DIAGNOSIS — N2581 Secondary hyperparathyroidism of renal origin: Secondary | ICD-10-CM | POA: Diagnosis not present

## 2017-10-06 DIAGNOSIS — Z79899 Other long term (current) drug therapy: Secondary | ICD-10-CM | POA: Diagnosis not present

## 2017-10-07 DIAGNOSIS — D631 Anemia in chronic kidney disease: Secondary | ICD-10-CM | POA: Diagnosis not present

## 2017-10-07 DIAGNOSIS — Z79899 Other long term (current) drug therapy: Secondary | ICD-10-CM | POA: Diagnosis not present

## 2017-10-07 DIAGNOSIS — N186 End stage renal disease: Secondary | ICD-10-CM | POA: Diagnosis not present

## 2017-10-07 DIAGNOSIS — N2581 Secondary hyperparathyroidism of renal origin: Secondary | ICD-10-CM | POA: Diagnosis not present

## 2017-10-07 DIAGNOSIS — D509 Iron deficiency anemia, unspecified: Secondary | ICD-10-CM | POA: Diagnosis not present

## 2017-10-07 DIAGNOSIS — E440 Moderate protein-calorie malnutrition: Secondary | ICD-10-CM | POA: Diagnosis not present

## 2017-10-08 DIAGNOSIS — N2581 Secondary hyperparathyroidism of renal origin: Secondary | ICD-10-CM | POA: Diagnosis not present

## 2017-10-08 DIAGNOSIS — Z79899 Other long term (current) drug therapy: Secondary | ICD-10-CM | POA: Diagnosis not present

## 2017-10-08 DIAGNOSIS — N186 End stage renal disease: Secondary | ICD-10-CM | POA: Diagnosis not present

## 2017-10-08 DIAGNOSIS — E440 Moderate protein-calorie malnutrition: Secondary | ICD-10-CM | POA: Diagnosis not present

## 2017-10-08 DIAGNOSIS — R82998 Other abnormal findings in urine: Secondary | ICD-10-CM | POA: Diagnosis not present

## 2017-10-08 DIAGNOSIS — D509 Iron deficiency anemia, unspecified: Secondary | ICD-10-CM | POA: Diagnosis not present

## 2017-10-08 DIAGNOSIS — D631 Anemia in chronic kidney disease: Secondary | ICD-10-CM | POA: Diagnosis not present

## 2017-10-09 DIAGNOSIS — Z79899 Other long term (current) drug therapy: Secondary | ICD-10-CM | POA: Diagnosis not present

## 2017-10-09 DIAGNOSIS — D631 Anemia in chronic kidney disease: Secondary | ICD-10-CM | POA: Diagnosis not present

## 2017-10-09 DIAGNOSIS — N2581 Secondary hyperparathyroidism of renal origin: Secondary | ICD-10-CM | POA: Diagnosis not present

## 2017-10-09 DIAGNOSIS — E440 Moderate protein-calorie malnutrition: Secondary | ICD-10-CM | POA: Diagnosis not present

## 2017-10-09 DIAGNOSIS — N186 End stage renal disease: Secondary | ICD-10-CM | POA: Diagnosis not present

## 2017-10-09 DIAGNOSIS — D509 Iron deficiency anemia, unspecified: Secondary | ICD-10-CM | POA: Diagnosis not present

## 2017-10-10 DIAGNOSIS — Z79899 Other long term (current) drug therapy: Secondary | ICD-10-CM | POA: Diagnosis not present

## 2017-10-10 DIAGNOSIS — D509 Iron deficiency anemia, unspecified: Secondary | ICD-10-CM | POA: Diagnosis not present

## 2017-10-10 DIAGNOSIS — N2581 Secondary hyperparathyroidism of renal origin: Secondary | ICD-10-CM | POA: Diagnosis not present

## 2017-10-10 DIAGNOSIS — D631 Anemia in chronic kidney disease: Secondary | ICD-10-CM | POA: Diagnosis not present

## 2017-10-10 DIAGNOSIS — E440 Moderate protein-calorie malnutrition: Secondary | ICD-10-CM | POA: Diagnosis not present

## 2017-10-10 DIAGNOSIS — N186 End stage renal disease: Secondary | ICD-10-CM | POA: Diagnosis not present

## 2017-10-11 DIAGNOSIS — D509 Iron deficiency anemia, unspecified: Secondary | ICD-10-CM | POA: Diagnosis not present

## 2017-10-11 DIAGNOSIS — D631 Anemia in chronic kidney disease: Secondary | ICD-10-CM | POA: Diagnosis not present

## 2017-10-11 DIAGNOSIS — N186 End stage renal disease: Secondary | ICD-10-CM | POA: Diagnosis not present

## 2017-10-11 DIAGNOSIS — E440 Moderate protein-calorie malnutrition: Secondary | ICD-10-CM | POA: Diagnosis not present

## 2017-10-11 DIAGNOSIS — N2581 Secondary hyperparathyroidism of renal origin: Secondary | ICD-10-CM | POA: Diagnosis not present

## 2017-10-11 DIAGNOSIS — Z79899 Other long term (current) drug therapy: Secondary | ICD-10-CM | POA: Diagnosis not present

## 2017-10-12 DIAGNOSIS — N2581 Secondary hyperparathyroidism of renal origin: Secondary | ICD-10-CM | POA: Diagnosis not present

## 2017-10-12 DIAGNOSIS — N186 End stage renal disease: Secondary | ICD-10-CM | POA: Diagnosis not present

## 2017-10-12 DIAGNOSIS — E440 Moderate protein-calorie malnutrition: Secondary | ICD-10-CM | POA: Diagnosis not present

## 2017-10-12 DIAGNOSIS — Z79899 Other long term (current) drug therapy: Secondary | ICD-10-CM | POA: Diagnosis not present

## 2017-10-12 DIAGNOSIS — D509 Iron deficiency anemia, unspecified: Secondary | ICD-10-CM | POA: Diagnosis not present

## 2017-10-12 DIAGNOSIS — D631 Anemia in chronic kidney disease: Secondary | ICD-10-CM | POA: Diagnosis not present

## 2017-10-13 DIAGNOSIS — E440 Moderate protein-calorie malnutrition: Secondary | ICD-10-CM | POA: Diagnosis not present

## 2017-10-13 DIAGNOSIS — Z79899 Other long term (current) drug therapy: Secondary | ICD-10-CM | POA: Diagnosis not present

## 2017-10-13 DIAGNOSIS — N186 End stage renal disease: Secondary | ICD-10-CM | POA: Diagnosis not present

## 2017-10-13 DIAGNOSIS — D631 Anemia in chronic kidney disease: Secondary | ICD-10-CM | POA: Diagnosis not present

## 2017-10-13 DIAGNOSIS — D509 Iron deficiency anemia, unspecified: Secondary | ICD-10-CM | POA: Diagnosis not present

## 2017-10-13 DIAGNOSIS — N2581 Secondary hyperparathyroidism of renal origin: Secondary | ICD-10-CM | POA: Diagnosis not present

## 2017-10-14 DIAGNOSIS — Z79899 Other long term (current) drug therapy: Secondary | ICD-10-CM | POA: Diagnosis not present

## 2017-10-14 DIAGNOSIS — D509 Iron deficiency anemia, unspecified: Secondary | ICD-10-CM | POA: Diagnosis not present

## 2017-10-14 DIAGNOSIS — N2581 Secondary hyperparathyroidism of renal origin: Secondary | ICD-10-CM | POA: Diagnosis not present

## 2017-10-14 DIAGNOSIS — E440 Moderate protein-calorie malnutrition: Secondary | ICD-10-CM | POA: Diagnosis not present

## 2017-10-14 DIAGNOSIS — D631 Anemia in chronic kidney disease: Secondary | ICD-10-CM | POA: Diagnosis not present

## 2017-10-14 DIAGNOSIS — N186 End stage renal disease: Secondary | ICD-10-CM | POA: Diagnosis not present

## 2017-10-15 DIAGNOSIS — Z79899 Other long term (current) drug therapy: Secondary | ICD-10-CM | POA: Diagnosis not present

## 2017-10-15 DIAGNOSIS — D631 Anemia in chronic kidney disease: Secondary | ICD-10-CM | POA: Diagnosis not present

## 2017-10-15 DIAGNOSIS — N2581 Secondary hyperparathyroidism of renal origin: Secondary | ICD-10-CM | POA: Diagnosis not present

## 2017-10-15 DIAGNOSIS — E440 Moderate protein-calorie malnutrition: Secondary | ICD-10-CM | POA: Diagnosis not present

## 2017-10-15 DIAGNOSIS — N186 End stage renal disease: Secondary | ICD-10-CM | POA: Diagnosis not present

## 2017-10-15 DIAGNOSIS — D509 Iron deficiency anemia, unspecified: Secondary | ICD-10-CM | POA: Diagnosis not present

## 2017-10-16 DIAGNOSIS — D631 Anemia in chronic kidney disease: Secondary | ICD-10-CM | POA: Diagnosis not present

## 2017-10-16 DIAGNOSIS — N186 End stage renal disease: Secondary | ICD-10-CM | POA: Diagnosis not present

## 2017-10-16 DIAGNOSIS — E440 Moderate protein-calorie malnutrition: Secondary | ICD-10-CM | POA: Diagnosis not present

## 2017-10-16 DIAGNOSIS — N2581 Secondary hyperparathyroidism of renal origin: Secondary | ICD-10-CM | POA: Diagnosis not present

## 2017-10-16 DIAGNOSIS — Z79899 Other long term (current) drug therapy: Secondary | ICD-10-CM | POA: Diagnosis not present

## 2017-10-16 DIAGNOSIS — D509 Iron deficiency anemia, unspecified: Secondary | ICD-10-CM | POA: Diagnosis not present

## 2017-10-17 DIAGNOSIS — D509 Iron deficiency anemia, unspecified: Secondary | ICD-10-CM | POA: Diagnosis not present

## 2017-10-17 DIAGNOSIS — E440 Moderate protein-calorie malnutrition: Secondary | ICD-10-CM | POA: Diagnosis not present

## 2017-10-17 DIAGNOSIS — Z79899 Other long term (current) drug therapy: Secondary | ICD-10-CM | POA: Diagnosis not present

## 2017-10-17 DIAGNOSIS — N2581 Secondary hyperparathyroidism of renal origin: Secondary | ICD-10-CM | POA: Diagnosis not present

## 2017-10-17 DIAGNOSIS — N186 End stage renal disease: Secondary | ICD-10-CM | POA: Diagnosis not present

## 2017-10-17 DIAGNOSIS — D631 Anemia in chronic kidney disease: Secondary | ICD-10-CM | POA: Diagnosis not present

## 2017-10-18 DIAGNOSIS — N186 End stage renal disease: Secondary | ICD-10-CM | POA: Diagnosis not present

## 2017-10-18 DIAGNOSIS — D631 Anemia in chronic kidney disease: Secondary | ICD-10-CM | POA: Diagnosis not present

## 2017-10-18 DIAGNOSIS — D509 Iron deficiency anemia, unspecified: Secondary | ICD-10-CM | POA: Diagnosis not present

## 2017-10-18 DIAGNOSIS — E440 Moderate protein-calorie malnutrition: Secondary | ICD-10-CM | POA: Diagnosis not present

## 2017-10-18 DIAGNOSIS — N2581 Secondary hyperparathyroidism of renal origin: Secondary | ICD-10-CM | POA: Diagnosis not present

## 2017-10-18 DIAGNOSIS — Z79899 Other long term (current) drug therapy: Secondary | ICD-10-CM | POA: Diagnosis not present

## 2017-10-19 DIAGNOSIS — N186 End stage renal disease: Secondary | ICD-10-CM | POA: Diagnosis not present

## 2017-10-19 DIAGNOSIS — N2581 Secondary hyperparathyroidism of renal origin: Secondary | ICD-10-CM | POA: Diagnosis not present

## 2017-10-19 DIAGNOSIS — D631 Anemia in chronic kidney disease: Secondary | ICD-10-CM | POA: Diagnosis not present

## 2017-10-19 DIAGNOSIS — D509 Iron deficiency anemia, unspecified: Secondary | ICD-10-CM | POA: Diagnosis not present

## 2017-10-19 DIAGNOSIS — Z79899 Other long term (current) drug therapy: Secondary | ICD-10-CM | POA: Diagnosis not present

## 2017-10-19 DIAGNOSIS — E440 Moderate protein-calorie malnutrition: Secondary | ICD-10-CM | POA: Diagnosis not present

## 2017-10-20 DIAGNOSIS — D509 Iron deficiency anemia, unspecified: Secondary | ICD-10-CM | POA: Diagnosis not present

## 2017-10-20 DIAGNOSIS — E440 Moderate protein-calorie malnutrition: Secondary | ICD-10-CM | POA: Diagnosis not present

## 2017-10-20 DIAGNOSIS — D631 Anemia in chronic kidney disease: Secondary | ICD-10-CM | POA: Diagnosis not present

## 2017-10-20 DIAGNOSIS — Z79899 Other long term (current) drug therapy: Secondary | ICD-10-CM | POA: Diagnosis not present

## 2017-10-20 DIAGNOSIS — N2581 Secondary hyperparathyroidism of renal origin: Secondary | ICD-10-CM | POA: Diagnosis not present

## 2017-10-20 DIAGNOSIS — N186 End stage renal disease: Secondary | ICD-10-CM | POA: Diagnosis not present

## 2017-10-21 DIAGNOSIS — N2581 Secondary hyperparathyroidism of renal origin: Secondary | ICD-10-CM | POA: Diagnosis not present

## 2017-10-21 DIAGNOSIS — D509 Iron deficiency anemia, unspecified: Secondary | ICD-10-CM | POA: Diagnosis not present

## 2017-10-21 DIAGNOSIS — D631 Anemia in chronic kidney disease: Secondary | ICD-10-CM | POA: Diagnosis not present

## 2017-10-21 DIAGNOSIS — E440 Moderate protein-calorie malnutrition: Secondary | ICD-10-CM | POA: Diagnosis not present

## 2017-10-21 DIAGNOSIS — N186 End stage renal disease: Secondary | ICD-10-CM | POA: Diagnosis not present

## 2017-10-21 DIAGNOSIS — Z79899 Other long term (current) drug therapy: Secondary | ICD-10-CM | POA: Diagnosis not present

## 2017-10-22 DIAGNOSIS — N2581 Secondary hyperparathyroidism of renal origin: Secondary | ICD-10-CM | POA: Diagnosis not present

## 2017-10-22 DIAGNOSIS — E440 Moderate protein-calorie malnutrition: Secondary | ICD-10-CM | POA: Diagnosis not present

## 2017-10-22 DIAGNOSIS — D509 Iron deficiency anemia, unspecified: Secondary | ICD-10-CM | POA: Diagnosis not present

## 2017-10-22 DIAGNOSIS — D631 Anemia in chronic kidney disease: Secondary | ICD-10-CM | POA: Diagnosis not present

## 2017-10-22 DIAGNOSIS — N186 End stage renal disease: Secondary | ICD-10-CM | POA: Diagnosis not present

## 2017-10-22 DIAGNOSIS — Z79899 Other long term (current) drug therapy: Secondary | ICD-10-CM | POA: Diagnosis not present

## 2017-10-23 DIAGNOSIS — N186 End stage renal disease: Secondary | ICD-10-CM | POA: Diagnosis not present

## 2017-10-23 DIAGNOSIS — E440 Moderate protein-calorie malnutrition: Secondary | ICD-10-CM | POA: Diagnosis not present

## 2017-10-23 DIAGNOSIS — Z79899 Other long term (current) drug therapy: Secondary | ICD-10-CM | POA: Diagnosis not present

## 2017-10-23 DIAGNOSIS — N2581 Secondary hyperparathyroidism of renal origin: Secondary | ICD-10-CM | POA: Diagnosis not present

## 2017-10-23 DIAGNOSIS — D631 Anemia in chronic kidney disease: Secondary | ICD-10-CM | POA: Diagnosis not present

## 2017-10-23 DIAGNOSIS — D509 Iron deficiency anemia, unspecified: Secondary | ICD-10-CM | POA: Diagnosis not present

## 2017-10-24 DIAGNOSIS — D631 Anemia in chronic kidney disease: Secondary | ICD-10-CM | POA: Diagnosis not present

## 2017-10-24 DIAGNOSIS — E440 Moderate protein-calorie malnutrition: Secondary | ICD-10-CM | POA: Diagnosis not present

## 2017-10-24 DIAGNOSIS — N186 End stage renal disease: Secondary | ICD-10-CM | POA: Diagnosis not present

## 2017-10-24 DIAGNOSIS — Z79899 Other long term (current) drug therapy: Secondary | ICD-10-CM | POA: Diagnosis not present

## 2017-10-24 DIAGNOSIS — D509 Iron deficiency anemia, unspecified: Secondary | ICD-10-CM | POA: Diagnosis not present

## 2017-10-24 DIAGNOSIS — N2581 Secondary hyperparathyroidism of renal origin: Secondary | ICD-10-CM | POA: Diagnosis not present

## 2017-10-25 DIAGNOSIS — E440 Moderate protein-calorie malnutrition: Secondary | ICD-10-CM | POA: Diagnosis not present

## 2017-10-25 DIAGNOSIS — D631 Anemia in chronic kidney disease: Secondary | ICD-10-CM | POA: Diagnosis not present

## 2017-10-25 DIAGNOSIS — N2581 Secondary hyperparathyroidism of renal origin: Secondary | ICD-10-CM | POA: Diagnosis not present

## 2017-10-25 DIAGNOSIS — N186 End stage renal disease: Secondary | ICD-10-CM | POA: Diagnosis not present

## 2017-10-25 DIAGNOSIS — Z79899 Other long term (current) drug therapy: Secondary | ICD-10-CM | POA: Diagnosis not present

## 2017-10-25 DIAGNOSIS — D509 Iron deficiency anemia, unspecified: Secondary | ICD-10-CM | POA: Diagnosis not present

## 2017-10-26 DIAGNOSIS — N186 End stage renal disease: Secondary | ICD-10-CM | POA: Diagnosis not present

## 2017-10-26 DIAGNOSIS — D509 Iron deficiency anemia, unspecified: Secondary | ICD-10-CM | POA: Diagnosis not present

## 2017-10-26 DIAGNOSIS — N2581 Secondary hyperparathyroidism of renal origin: Secondary | ICD-10-CM | POA: Diagnosis not present

## 2017-10-26 DIAGNOSIS — D631 Anemia in chronic kidney disease: Secondary | ICD-10-CM | POA: Diagnosis not present

## 2017-10-26 DIAGNOSIS — E440 Moderate protein-calorie malnutrition: Secondary | ICD-10-CM | POA: Diagnosis not present

## 2017-10-26 DIAGNOSIS — Z79899 Other long term (current) drug therapy: Secondary | ICD-10-CM | POA: Diagnosis not present

## 2017-10-27 DIAGNOSIS — N2581 Secondary hyperparathyroidism of renal origin: Secondary | ICD-10-CM | POA: Diagnosis not present

## 2017-10-27 DIAGNOSIS — E440 Moderate protein-calorie malnutrition: Secondary | ICD-10-CM | POA: Diagnosis not present

## 2017-10-27 DIAGNOSIS — D631 Anemia in chronic kidney disease: Secondary | ICD-10-CM | POA: Diagnosis not present

## 2017-10-27 DIAGNOSIS — N186 End stage renal disease: Secondary | ICD-10-CM | POA: Diagnosis not present

## 2017-10-27 DIAGNOSIS — D509 Iron deficiency anemia, unspecified: Secondary | ICD-10-CM | POA: Diagnosis not present

## 2017-10-27 DIAGNOSIS — Z79899 Other long term (current) drug therapy: Secondary | ICD-10-CM | POA: Diagnosis not present

## 2017-10-28 DIAGNOSIS — E440 Moderate protein-calorie malnutrition: Secondary | ICD-10-CM | POA: Diagnosis not present

## 2017-10-28 DIAGNOSIS — D631 Anemia in chronic kidney disease: Secondary | ICD-10-CM | POA: Diagnosis not present

## 2017-10-28 DIAGNOSIS — N2581 Secondary hyperparathyroidism of renal origin: Secondary | ICD-10-CM | POA: Diagnosis not present

## 2017-10-28 DIAGNOSIS — N186 End stage renal disease: Secondary | ICD-10-CM | POA: Diagnosis not present

## 2017-10-28 DIAGNOSIS — Z79899 Other long term (current) drug therapy: Secondary | ICD-10-CM | POA: Diagnosis not present

## 2017-10-28 DIAGNOSIS — D509 Iron deficiency anemia, unspecified: Secondary | ICD-10-CM | POA: Diagnosis not present

## 2017-10-29 DIAGNOSIS — E440 Moderate protein-calorie malnutrition: Secondary | ICD-10-CM | POA: Diagnosis not present

## 2017-10-29 DIAGNOSIS — N186 End stage renal disease: Secondary | ICD-10-CM | POA: Diagnosis not present

## 2017-10-29 DIAGNOSIS — Z79899 Other long term (current) drug therapy: Secondary | ICD-10-CM | POA: Diagnosis not present

## 2017-10-29 DIAGNOSIS — D509 Iron deficiency anemia, unspecified: Secondary | ICD-10-CM | POA: Diagnosis not present

## 2017-10-29 DIAGNOSIS — N2581 Secondary hyperparathyroidism of renal origin: Secondary | ICD-10-CM | POA: Diagnosis not present

## 2017-10-29 DIAGNOSIS — D631 Anemia in chronic kidney disease: Secondary | ICD-10-CM | POA: Diagnosis not present

## 2017-10-30 DIAGNOSIS — E440 Moderate protein-calorie malnutrition: Secondary | ICD-10-CM | POA: Diagnosis not present

## 2017-10-30 DIAGNOSIS — D509 Iron deficiency anemia, unspecified: Secondary | ICD-10-CM | POA: Diagnosis not present

## 2017-10-30 DIAGNOSIS — D631 Anemia in chronic kidney disease: Secondary | ICD-10-CM | POA: Diagnosis not present

## 2017-10-30 DIAGNOSIS — N186 End stage renal disease: Secondary | ICD-10-CM | POA: Diagnosis not present

## 2017-10-30 DIAGNOSIS — Z79899 Other long term (current) drug therapy: Secondary | ICD-10-CM | POA: Diagnosis not present

## 2017-10-30 DIAGNOSIS — N2581 Secondary hyperparathyroidism of renal origin: Secondary | ICD-10-CM | POA: Diagnosis not present

## 2017-10-30 DIAGNOSIS — H40013 Open angle with borderline findings, low risk, bilateral: Secondary | ICD-10-CM | POA: Diagnosis not present

## 2017-10-31 DIAGNOSIS — D631 Anemia in chronic kidney disease: Secondary | ICD-10-CM | POA: Diagnosis not present

## 2017-10-31 DIAGNOSIS — N2581 Secondary hyperparathyroidism of renal origin: Secondary | ICD-10-CM | POA: Diagnosis not present

## 2017-10-31 DIAGNOSIS — E440 Moderate protein-calorie malnutrition: Secondary | ICD-10-CM | POA: Diagnosis not present

## 2017-10-31 DIAGNOSIS — N186 End stage renal disease: Secondary | ICD-10-CM | POA: Diagnosis not present

## 2017-10-31 DIAGNOSIS — Z79899 Other long term (current) drug therapy: Secondary | ICD-10-CM | POA: Diagnosis not present

## 2017-10-31 DIAGNOSIS — D509 Iron deficiency anemia, unspecified: Secondary | ICD-10-CM | POA: Diagnosis not present

## 2017-11-01 DIAGNOSIS — N2581 Secondary hyperparathyroidism of renal origin: Secondary | ICD-10-CM | POA: Diagnosis not present

## 2017-11-01 DIAGNOSIS — D631 Anemia in chronic kidney disease: Secondary | ICD-10-CM | POA: Diagnosis not present

## 2017-11-01 DIAGNOSIS — N186 End stage renal disease: Secondary | ICD-10-CM | POA: Diagnosis not present

## 2017-11-01 DIAGNOSIS — E440 Moderate protein-calorie malnutrition: Secondary | ICD-10-CM | POA: Diagnosis not present

## 2017-11-01 DIAGNOSIS — D509 Iron deficiency anemia, unspecified: Secondary | ICD-10-CM | POA: Diagnosis not present

## 2017-11-01 DIAGNOSIS — Z79899 Other long term (current) drug therapy: Secondary | ICD-10-CM | POA: Diagnosis not present

## 2017-11-02 DIAGNOSIS — R82998 Other abnormal findings in urine: Secondary | ICD-10-CM | POA: Diagnosis not present

## 2017-11-02 DIAGNOSIS — N2581 Secondary hyperparathyroidism of renal origin: Secondary | ICD-10-CM | POA: Diagnosis not present

## 2017-11-02 DIAGNOSIS — N186 End stage renal disease: Secondary | ICD-10-CM | POA: Diagnosis not present

## 2017-11-02 DIAGNOSIS — Z992 Dependence on renal dialysis: Secondary | ICD-10-CM | POA: Diagnosis not present

## 2017-11-02 DIAGNOSIS — D631 Anemia in chronic kidney disease: Secondary | ICD-10-CM | POA: Diagnosis not present

## 2017-11-02 DIAGNOSIS — E440 Moderate protein-calorie malnutrition: Secondary | ICD-10-CM | POA: Diagnosis not present

## 2017-11-02 DIAGNOSIS — Z79899 Other long term (current) drug therapy: Secondary | ICD-10-CM | POA: Diagnosis not present

## 2017-11-02 DIAGNOSIS — D509 Iron deficiency anemia, unspecified: Secondary | ICD-10-CM | POA: Diagnosis not present

## 2017-11-02 DIAGNOSIS — N2889 Other specified disorders of kidney and ureter: Secondary | ICD-10-CM | POA: Diagnosis not present

## 2017-11-03 DIAGNOSIS — Z79899 Other long term (current) drug therapy: Secondary | ICD-10-CM | POA: Diagnosis not present

## 2017-11-03 DIAGNOSIS — D631 Anemia in chronic kidney disease: Secondary | ICD-10-CM | POA: Diagnosis not present

## 2017-11-03 DIAGNOSIS — D509 Iron deficiency anemia, unspecified: Secondary | ICD-10-CM | POA: Diagnosis not present

## 2017-11-03 DIAGNOSIS — N2581 Secondary hyperparathyroidism of renal origin: Secondary | ICD-10-CM | POA: Diagnosis not present

## 2017-11-03 DIAGNOSIS — E440 Moderate protein-calorie malnutrition: Secondary | ICD-10-CM | POA: Diagnosis not present

## 2017-11-03 DIAGNOSIS — N186 End stage renal disease: Secondary | ICD-10-CM | POA: Diagnosis not present

## 2017-11-04 DIAGNOSIS — D631 Anemia in chronic kidney disease: Secondary | ICD-10-CM | POA: Diagnosis not present

## 2017-11-04 DIAGNOSIS — Z79899 Other long term (current) drug therapy: Secondary | ICD-10-CM | POA: Diagnosis not present

## 2017-11-04 DIAGNOSIS — E440 Moderate protein-calorie malnutrition: Secondary | ICD-10-CM | POA: Diagnosis not present

## 2017-11-04 DIAGNOSIS — N2581 Secondary hyperparathyroidism of renal origin: Secondary | ICD-10-CM | POA: Diagnosis not present

## 2017-11-04 DIAGNOSIS — D509 Iron deficiency anemia, unspecified: Secondary | ICD-10-CM | POA: Diagnosis not present

## 2017-11-04 DIAGNOSIS — N186 End stage renal disease: Secondary | ICD-10-CM | POA: Diagnosis not present

## 2017-11-05 DIAGNOSIS — N186 End stage renal disease: Secondary | ICD-10-CM | POA: Diagnosis not present

## 2017-11-05 DIAGNOSIS — D509 Iron deficiency anemia, unspecified: Secondary | ICD-10-CM | POA: Diagnosis not present

## 2017-11-05 DIAGNOSIS — E440 Moderate protein-calorie malnutrition: Secondary | ICD-10-CM | POA: Diagnosis not present

## 2017-11-05 DIAGNOSIS — Z79899 Other long term (current) drug therapy: Secondary | ICD-10-CM | POA: Diagnosis not present

## 2017-11-05 DIAGNOSIS — N2581 Secondary hyperparathyroidism of renal origin: Secondary | ICD-10-CM | POA: Diagnosis not present

## 2017-11-05 DIAGNOSIS — D631 Anemia in chronic kidney disease: Secondary | ICD-10-CM | POA: Diagnosis not present

## 2017-11-06 DIAGNOSIS — Z79899 Other long term (current) drug therapy: Secondary | ICD-10-CM | POA: Diagnosis not present

## 2017-11-06 DIAGNOSIS — D631 Anemia in chronic kidney disease: Secondary | ICD-10-CM | POA: Diagnosis not present

## 2017-11-06 DIAGNOSIS — D509 Iron deficiency anemia, unspecified: Secondary | ICD-10-CM | POA: Diagnosis not present

## 2017-11-06 DIAGNOSIS — N186 End stage renal disease: Secondary | ICD-10-CM | POA: Diagnosis not present

## 2017-11-06 DIAGNOSIS — N2581 Secondary hyperparathyroidism of renal origin: Secondary | ICD-10-CM | POA: Diagnosis not present

## 2017-11-06 DIAGNOSIS — E440 Moderate protein-calorie malnutrition: Secondary | ICD-10-CM | POA: Diagnosis not present

## 2017-11-07 DIAGNOSIS — Z79899 Other long term (current) drug therapy: Secondary | ICD-10-CM | POA: Diagnosis not present

## 2017-11-07 DIAGNOSIS — D509 Iron deficiency anemia, unspecified: Secondary | ICD-10-CM | POA: Diagnosis not present

## 2017-11-07 DIAGNOSIS — N186 End stage renal disease: Secondary | ICD-10-CM | POA: Diagnosis not present

## 2017-11-07 DIAGNOSIS — N2581 Secondary hyperparathyroidism of renal origin: Secondary | ICD-10-CM | POA: Diagnosis not present

## 2017-11-07 DIAGNOSIS — E440 Moderate protein-calorie malnutrition: Secondary | ICD-10-CM | POA: Diagnosis not present

## 2017-11-07 DIAGNOSIS — D631 Anemia in chronic kidney disease: Secondary | ICD-10-CM | POA: Diagnosis not present

## 2017-11-08 DIAGNOSIS — Z79899 Other long term (current) drug therapy: Secondary | ICD-10-CM | POA: Diagnosis not present

## 2017-11-08 DIAGNOSIS — E440 Moderate protein-calorie malnutrition: Secondary | ICD-10-CM | POA: Diagnosis not present

## 2017-11-08 DIAGNOSIS — D631 Anemia in chronic kidney disease: Secondary | ICD-10-CM | POA: Diagnosis not present

## 2017-11-08 DIAGNOSIS — N2581 Secondary hyperparathyroidism of renal origin: Secondary | ICD-10-CM | POA: Diagnosis not present

## 2017-11-08 DIAGNOSIS — N186 End stage renal disease: Secondary | ICD-10-CM | POA: Diagnosis not present

## 2017-11-08 DIAGNOSIS — D509 Iron deficiency anemia, unspecified: Secondary | ICD-10-CM | POA: Diagnosis not present

## 2017-11-09 DIAGNOSIS — N186 End stage renal disease: Secondary | ICD-10-CM | POA: Diagnosis not present

## 2017-11-09 DIAGNOSIS — D509 Iron deficiency anemia, unspecified: Secondary | ICD-10-CM | POA: Diagnosis not present

## 2017-11-09 DIAGNOSIS — E440 Moderate protein-calorie malnutrition: Secondary | ICD-10-CM | POA: Diagnosis not present

## 2017-11-09 DIAGNOSIS — N2581 Secondary hyperparathyroidism of renal origin: Secondary | ICD-10-CM | POA: Diagnosis not present

## 2017-11-09 DIAGNOSIS — Z79899 Other long term (current) drug therapy: Secondary | ICD-10-CM | POA: Diagnosis not present

## 2017-11-09 DIAGNOSIS — D631 Anemia in chronic kidney disease: Secondary | ICD-10-CM | POA: Diagnosis not present

## 2017-11-10 DIAGNOSIS — D509 Iron deficiency anemia, unspecified: Secondary | ICD-10-CM | POA: Diagnosis not present

## 2017-11-10 DIAGNOSIS — E440 Moderate protein-calorie malnutrition: Secondary | ICD-10-CM | POA: Diagnosis not present

## 2017-11-10 DIAGNOSIS — Z79899 Other long term (current) drug therapy: Secondary | ICD-10-CM | POA: Diagnosis not present

## 2017-11-10 DIAGNOSIS — D631 Anemia in chronic kidney disease: Secondary | ICD-10-CM | POA: Diagnosis not present

## 2017-11-10 DIAGNOSIS — N2581 Secondary hyperparathyroidism of renal origin: Secondary | ICD-10-CM | POA: Diagnosis not present

## 2017-11-10 DIAGNOSIS — N186 End stage renal disease: Secondary | ICD-10-CM | POA: Diagnosis not present

## 2017-11-11 DIAGNOSIS — D509 Iron deficiency anemia, unspecified: Secondary | ICD-10-CM | POA: Diagnosis not present

## 2017-11-11 DIAGNOSIS — Z79899 Other long term (current) drug therapy: Secondary | ICD-10-CM | POA: Diagnosis not present

## 2017-11-11 DIAGNOSIS — D631 Anemia in chronic kidney disease: Secondary | ICD-10-CM | POA: Diagnosis not present

## 2017-11-11 DIAGNOSIS — E440 Moderate protein-calorie malnutrition: Secondary | ICD-10-CM | POA: Diagnosis not present

## 2017-11-11 DIAGNOSIS — N186 End stage renal disease: Secondary | ICD-10-CM | POA: Diagnosis not present

## 2017-11-11 DIAGNOSIS — N2581 Secondary hyperparathyroidism of renal origin: Secondary | ICD-10-CM | POA: Diagnosis not present

## 2017-11-12 DIAGNOSIS — E440 Moderate protein-calorie malnutrition: Secondary | ICD-10-CM | POA: Diagnosis not present

## 2017-11-12 DIAGNOSIS — D509 Iron deficiency anemia, unspecified: Secondary | ICD-10-CM | POA: Diagnosis not present

## 2017-11-12 DIAGNOSIS — D631 Anemia in chronic kidney disease: Secondary | ICD-10-CM | POA: Diagnosis not present

## 2017-11-12 DIAGNOSIS — N186 End stage renal disease: Secondary | ICD-10-CM | POA: Diagnosis not present

## 2017-11-12 DIAGNOSIS — N2581 Secondary hyperparathyroidism of renal origin: Secondary | ICD-10-CM | POA: Diagnosis not present

## 2017-11-12 DIAGNOSIS — Z79899 Other long term (current) drug therapy: Secondary | ICD-10-CM | POA: Diagnosis not present

## 2017-11-13 DIAGNOSIS — E440 Moderate protein-calorie malnutrition: Secondary | ICD-10-CM | POA: Diagnosis not present

## 2017-11-13 DIAGNOSIS — N186 End stage renal disease: Secondary | ICD-10-CM | POA: Diagnosis not present

## 2017-11-13 DIAGNOSIS — N2581 Secondary hyperparathyroidism of renal origin: Secondary | ICD-10-CM | POA: Diagnosis not present

## 2017-11-13 DIAGNOSIS — Z79899 Other long term (current) drug therapy: Secondary | ICD-10-CM | POA: Diagnosis not present

## 2017-11-13 DIAGNOSIS — D509 Iron deficiency anemia, unspecified: Secondary | ICD-10-CM | POA: Diagnosis not present

## 2017-11-13 DIAGNOSIS — D631 Anemia in chronic kidney disease: Secondary | ICD-10-CM | POA: Diagnosis not present

## 2017-11-14 DIAGNOSIS — N186 End stage renal disease: Secondary | ICD-10-CM | POA: Diagnosis not present

## 2017-11-14 DIAGNOSIS — D509 Iron deficiency anemia, unspecified: Secondary | ICD-10-CM | POA: Diagnosis not present

## 2017-11-14 DIAGNOSIS — Z79899 Other long term (current) drug therapy: Secondary | ICD-10-CM | POA: Diagnosis not present

## 2017-11-14 DIAGNOSIS — D631 Anemia in chronic kidney disease: Secondary | ICD-10-CM | POA: Diagnosis not present

## 2017-11-14 DIAGNOSIS — N2581 Secondary hyperparathyroidism of renal origin: Secondary | ICD-10-CM | POA: Diagnosis not present

## 2017-11-14 DIAGNOSIS — E440 Moderate protein-calorie malnutrition: Secondary | ICD-10-CM | POA: Diagnosis not present

## 2017-11-15 DIAGNOSIS — N186 End stage renal disease: Secondary | ICD-10-CM | POA: Diagnosis not present

## 2017-11-15 DIAGNOSIS — E440 Moderate protein-calorie malnutrition: Secondary | ICD-10-CM | POA: Diagnosis not present

## 2017-11-15 DIAGNOSIS — D631 Anemia in chronic kidney disease: Secondary | ICD-10-CM | POA: Diagnosis not present

## 2017-11-15 DIAGNOSIS — N2581 Secondary hyperparathyroidism of renal origin: Secondary | ICD-10-CM | POA: Diagnosis not present

## 2017-11-15 DIAGNOSIS — D509 Iron deficiency anemia, unspecified: Secondary | ICD-10-CM | POA: Diagnosis not present

## 2017-11-15 DIAGNOSIS — Z79899 Other long term (current) drug therapy: Secondary | ICD-10-CM | POA: Diagnosis not present

## 2017-11-16 DIAGNOSIS — Z79899 Other long term (current) drug therapy: Secondary | ICD-10-CM | POA: Diagnosis not present

## 2017-11-16 DIAGNOSIS — D631 Anemia in chronic kidney disease: Secondary | ICD-10-CM | POA: Diagnosis not present

## 2017-11-16 DIAGNOSIS — N2581 Secondary hyperparathyroidism of renal origin: Secondary | ICD-10-CM | POA: Diagnosis not present

## 2017-11-16 DIAGNOSIS — D509 Iron deficiency anemia, unspecified: Secondary | ICD-10-CM | POA: Diagnosis not present

## 2017-11-16 DIAGNOSIS — N186 End stage renal disease: Secondary | ICD-10-CM | POA: Diagnosis not present

## 2017-11-16 DIAGNOSIS — E440 Moderate protein-calorie malnutrition: Secondary | ICD-10-CM | POA: Diagnosis not present

## 2017-11-17 DIAGNOSIS — D509 Iron deficiency anemia, unspecified: Secondary | ICD-10-CM | POA: Diagnosis not present

## 2017-11-17 DIAGNOSIS — D631 Anemia in chronic kidney disease: Secondary | ICD-10-CM | POA: Diagnosis not present

## 2017-11-17 DIAGNOSIS — N2581 Secondary hyperparathyroidism of renal origin: Secondary | ICD-10-CM | POA: Diagnosis not present

## 2017-11-17 DIAGNOSIS — E440 Moderate protein-calorie malnutrition: Secondary | ICD-10-CM | POA: Diagnosis not present

## 2017-11-17 DIAGNOSIS — Z79899 Other long term (current) drug therapy: Secondary | ICD-10-CM | POA: Diagnosis not present

## 2017-11-17 DIAGNOSIS — N186 End stage renal disease: Secondary | ICD-10-CM | POA: Diagnosis not present

## 2017-11-18 DIAGNOSIS — D509 Iron deficiency anemia, unspecified: Secondary | ICD-10-CM | POA: Diagnosis not present

## 2017-11-18 DIAGNOSIS — Z79899 Other long term (current) drug therapy: Secondary | ICD-10-CM | POA: Diagnosis not present

## 2017-11-18 DIAGNOSIS — D631 Anemia in chronic kidney disease: Secondary | ICD-10-CM | POA: Diagnosis not present

## 2017-11-18 DIAGNOSIS — N2581 Secondary hyperparathyroidism of renal origin: Secondary | ICD-10-CM | POA: Diagnosis not present

## 2017-11-18 DIAGNOSIS — N186 End stage renal disease: Secondary | ICD-10-CM | POA: Diagnosis not present

## 2017-11-18 DIAGNOSIS — E440 Moderate protein-calorie malnutrition: Secondary | ICD-10-CM | POA: Diagnosis not present

## 2017-11-19 DIAGNOSIS — N186 End stage renal disease: Secondary | ICD-10-CM | POA: Diagnosis not present

## 2017-11-19 DIAGNOSIS — Z79899 Other long term (current) drug therapy: Secondary | ICD-10-CM | POA: Diagnosis not present

## 2017-11-19 DIAGNOSIS — D509 Iron deficiency anemia, unspecified: Secondary | ICD-10-CM | POA: Diagnosis not present

## 2017-11-19 DIAGNOSIS — E440 Moderate protein-calorie malnutrition: Secondary | ICD-10-CM | POA: Diagnosis not present

## 2017-11-19 DIAGNOSIS — D631 Anemia in chronic kidney disease: Secondary | ICD-10-CM | POA: Diagnosis not present

## 2017-11-19 DIAGNOSIS — N2581 Secondary hyperparathyroidism of renal origin: Secondary | ICD-10-CM | POA: Diagnosis not present

## 2017-11-20 DIAGNOSIS — E440 Moderate protein-calorie malnutrition: Secondary | ICD-10-CM | POA: Diagnosis not present

## 2017-11-20 DIAGNOSIS — N186 End stage renal disease: Secondary | ICD-10-CM | POA: Diagnosis not present

## 2017-11-20 DIAGNOSIS — N2581 Secondary hyperparathyroidism of renal origin: Secondary | ICD-10-CM | POA: Diagnosis not present

## 2017-11-20 DIAGNOSIS — D631 Anemia in chronic kidney disease: Secondary | ICD-10-CM | POA: Diagnosis not present

## 2017-11-20 DIAGNOSIS — D509 Iron deficiency anemia, unspecified: Secondary | ICD-10-CM | POA: Diagnosis not present

## 2017-11-20 DIAGNOSIS — Z79899 Other long term (current) drug therapy: Secondary | ICD-10-CM | POA: Diagnosis not present

## 2017-11-21 DIAGNOSIS — D509 Iron deficiency anemia, unspecified: Secondary | ICD-10-CM | POA: Diagnosis not present

## 2017-11-21 DIAGNOSIS — Z79899 Other long term (current) drug therapy: Secondary | ICD-10-CM | POA: Diagnosis not present

## 2017-11-21 DIAGNOSIS — D631 Anemia in chronic kidney disease: Secondary | ICD-10-CM | POA: Diagnosis not present

## 2017-11-21 DIAGNOSIS — N2581 Secondary hyperparathyroidism of renal origin: Secondary | ICD-10-CM | POA: Diagnosis not present

## 2017-11-21 DIAGNOSIS — E440 Moderate protein-calorie malnutrition: Secondary | ICD-10-CM | POA: Diagnosis not present

## 2017-11-21 DIAGNOSIS — N186 End stage renal disease: Secondary | ICD-10-CM | POA: Diagnosis not present

## 2017-11-22 DIAGNOSIS — D631 Anemia in chronic kidney disease: Secondary | ICD-10-CM | POA: Diagnosis not present

## 2017-11-22 DIAGNOSIS — N186 End stage renal disease: Secondary | ICD-10-CM | POA: Diagnosis not present

## 2017-11-22 DIAGNOSIS — E440 Moderate protein-calorie malnutrition: Secondary | ICD-10-CM | POA: Diagnosis not present

## 2017-11-22 DIAGNOSIS — N2581 Secondary hyperparathyroidism of renal origin: Secondary | ICD-10-CM | POA: Diagnosis not present

## 2017-11-22 DIAGNOSIS — D509 Iron deficiency anemia, unspecified: Secondary | ICD-10-CM | POA: Diagnosis not present

## 2017-11-22 DIAGNOSIS — Z79899 Other long term (current) drug therapy: Secondary | ICD-10-CM | POA: Diagnosis not present

## 2017-11-23 DIAGNOSIS — E440 Moderate protein-calorie malnutrition: Secondary | ICD-10-CM | POA: Diagnosis not present

## 2017-11-23 DIAGNOSIS — D631 Anemia in chronic kidney disease: Secondary | ICD-10-CM | POA: Diagnosis not present

## 2017-11-23 DIAGNOSIS — Z79899 Other long term (current) drug therapy: Secondary | ICD-10-CM | POA: Diagnosis not present

## 2017-11-23 DIAGNOSIS — N2581 Secondary hyperparathyroidism of renal origin: Secondary | ICD-10-CM | POA: Diagnosis not present

## 2017-11-23 DIAGNOSIS — N186 End stage renal disease: Secondary | ICD-10-CM | POA: Diagnosis not present

## 2017-11-23 DIAGNOSIS — D509 Iron deficiency anemia, unspecified: Secondary | ICD-10-CM | POA: Diagnosis not present

## 2017-11-24 DIAGNOSIS — Z79899 Other long term (current) drug therapy: Secondary | ICD-10-CM | POA: Diagnosis not present

## 2017-11-24 DIAGNOSIS — D509 Iron deficiency anemia, unspecified: Secondary | ICD-10-CM | POA: Diagnosis not present

## 2017-11-24 DIAGNOSIS — N2581 Secondary hyperparathyroidism of renal origin: Secondary | ICD-10-CM | POA: Diagnosis not present

## 2017-11-24 DIAGNOSIS — E440 Moderate protein-calorie malnutrition: Secondary | ICD-10-CM | POA: Diagnosis not present

## 2017-11-24 DIAGNOSIS — D631 Anemia in chronic kidney disease: Secondary | ICD-10-CM | POA: Diagnosis not present

## 2017-11-24 DIAGNOSIS — N186 End stage renal disease: Secondary | ICD-10-CM | POA: Diagnosis not present

## 2017-11-25 DIAGNOSIS — E440 Moderate protein-calorie malnutrition: Secondary | ICD-10-CM | POA: Diagnosis not present

## 2017-11-25 DIAGNOSIS — D631 Anemia in chronic kidney disease: Secondary | ICD-10-CM | POA: Diagnosis not present

## 2017-11-25 DIAGNOSIS — N186 End stage renal disease: Secondary | ICD-10-CM | POA: Diagnosis not present

## 2017-11-25 DIAGNOSIS — N2581 Secondary hyperparathyroidism of renal origin: Secondary | ICD-10-CM | POA: Diagnosis not present

## 2017-11-25 DIAGNOSIS — Z79899 Other long term (current) drug therapy: Secondary | ICD-10-CM | POA: Diagnosis not present

## 2017-11-25 DIAGNOSIS — D509 Iron deficiency anemia, unspecified: Secondary | ICD-10-CM | POA: Diagnosis not present

## 2017-11-26 DIAGNOSIS — D509 Iron deficiency anemia, unspecified: Secondary | ICD-10-CM | POA: Diagnosis not present

## 2017-11-26 DIAGNOSIS — N186 End stage renal disease: Secondary | ICD-10-CM | POA: Diagnosis not present

## 2017-11-26 DIAGNOSIS — N2581 Secondary hyperparathyroidism of renal origin: Secondary | ICD-10-CM | POA: Diagnosis not present

## 2017-11-26 DIAGNOSIS — D631 Anemia in chronic kidney disease: Secondary | ICD-10-CM | POA: Diagnosis not present

## 2017-11-26 DIAGNOSIS — E440 Moderate protein-calorie malnutrition: Secondary | ICD-10-CM | POA: Diagnosis not present

## 2017-11-26 DIAGNOSIS — Z79899 Other long term (current) drug therapy: Secondary | ICD-10-CM | POA: Diagnosis not present

## 2017-11-27 DIAGNOSIS — Z79899 Other long term (current) drug therapy: Secondary | ICD-10-CM | POA: Diagnosis not present

## 2017-11-27 DIAGNOSIS — N186 End stage renal disease: Secondary | ICD-10-CM | POA: Diagnosis not present

## 2017-11-27 DIAGNOSIS — D509 Iron deficiency anemia, unspecified: Secondary | ICD-10-CM | POA: Diagnosis not present

## 2017-11-27 DIAGNOSIS — N2581 Secondary hyperparathyroidism of renal origin: Secondary | ICD-10-CM | POA: Diagnosis not present

## 2017-11-27 DIAGNOSIS — D631 Anemia in chronic kidney disease: Secondary | ICD-10-CM | POA: Diagnosis not present

## 2017-11-27 DIAGNOSIS — E440 Moderate protein-calorie malnutrition: Secondary | ICD-10-CM | POA: Diagnosis not present

## 2017-11-28 DIAGNOSIS — Z79899 Other long term (current) drug therapy: Secondary | ICD-10-CM | POA: Diagnosis not present

## 2017-11-28 DIAGNOSIS — E440 Moderate protein-calorie malnutrition: Secondary | ICD-10-CM | POA: Diagnosis not present

## 2017-11-28 DIAGNOSIS — N186 End stage renal disease: Secondary | ICD-10-CM | POA: Diagnosis not present

## 2017-11-28 DIAGNOSIS — D631 Anemia in chronic kidney disease: Secondary | ICD-10-CM | POA: Diagnosis not present

## 2017-11-28 DIAGNOSIS — N2581 Secondary hyperparathyroidism of renal origin: Secondary | ICD-10-CM | POA: Diagnosis not present

## 2017-11-28 DIAGNOSIS — D509 Iron deficiency anemia, unspecified: Secondary | ICD-10-CM | POA: Diagnosis not present

## 2017-11-29 DIAGNOSIS — N186 End stage renal disease: Secondary | ICD-10-CM | POA: Diagnosis not present

## 2017-11-29 DIAGNOSIS — Z79899 Other long term (current) drug therapy: Secondary | ICD-10-CM | POA: Diagnosis not present

## 2017-11-29 DIAGNOSIS — D509 Iron deficiency anemia, unspecified: Secondary | ICD-10-CM | POA: Diagnosis not present

## 2017-11-29 DIAGNOSIS — D631 Anemia in chronic kidney disease: Secondary | ICD-10-CM | POA: Diagnosis not present

## 2017-11-29 DIAGNOSIS — E440 Moderate protein-calorie malnutrition: Secondary | ICD-10-CM | POA: Diagnosis not present

## 2017-11-29 DIAGNOSIS — N2581 Secondary hyperparathyroidism of renal origin: Secondary | ICD-10-CM | POA: Diagnosis not present

## 2017-11-30 DIAGNOSIS — D509 Iron deficiency anemia, unspecified: Secondary | ICD-10-CM | POA: Diagnosis not present

## 2017-11-30 DIAGNOSIS — N2581 Secondary hyperparathyroidism of renal origin: Secondary | ICD-10-CM | POA: Diagnosis not present

## 2017-11-30 DIAGNOSIS — N186 End stage renal disease: Secondary | ICD-10-CM | POA: Diagnosis not present

## 2017-11-30 DIAGNOSIS — Z79899 Other long term (current) drug therapy: Secondary | ICD-10-CM | POA: Diagnosis not present

## 2017-11-30 DIAGNOSIS — D631 Anemia in chronic kidney disease: Secondary | ICD-10-CM | POA: Diagnosis not present

## 2017-11-30 DIAGNOSIS — E440 Moderate protein-calorie malnutrition: Secondary | ICD-10-CM | POA: Diagnosis not present

## 2017-12-01 DIAGNOSIS — Z79899 Other long term (current) drug therapy: Secondary | ICD-10-CM | POA: Diagnosis not present

## 2017-12-01 DIAGNOSIS — N186 End stage renal disease: Secondary | ICD-10-CM | POA: Diagnosis not present

## 2017-12-01 DIAGNOSIS — D509 Iron deficiency anemia, unspecified: Secondary | ICD-10-CM | POA: Diagnosis not present

## 2017-12-01 DIAGNOSIS — D631 Anemia in chronic kidney disease: Secondary | ICD-10-CM | POA: Diagnosis not present

## 2017-12-01 DIAGNOSIS — N2581 Secondary hyperparathyroidism of renal origin: Secondary | ICD-10-CM | POA: Diagnosis not present

## 2017-12-01 DIAGNOSIS — E440 Moderate protein-calorie malnutrition: Secondary | ICD-10-CM | POA: Diagnosis not present

## 2017-12-02 DIAGNOSIS — N2581 Secondary hyperparathyroidism of renal origin: Secondary | ICD-10-CM | POA: Diagnosis not present

## 2017-12-02 DIAGNOSIS — E440 Moderate protein-calorie malnutrition: Secondary | ICD-10-CM | POA: Diagnosis not present

## 2017-12-02 DIAGNOSIS — N186 End stage renal disease: Secondary | ICD-10-CM | POA: Diagnosis not present

## 2017-12-02 DIAGNOSIS — Z79899 Other long term (current) drug therapy: Secondary | ICD-10-CM | POA: Diagnosis not present

## 2017-12-02 DIAGNOSIS — D509 Iron deficiency anemia, unspecified: Secondary | ICD-10-CM | POA: Diagnosis not present

## 2017-12-02 DIAGNOSIS — D631 Anemia in chronic kidney disease: Secondary | ICD-10-CM | POA: Diagnosis not present

## 2017-12-03 DIAGNOSIS — N2889 Other specified disorders of kidney and ureter: Secondary | ICD-10-CM | POA: Diagnosis not present

## 2017-12-03 DIAGNOSIS — D631 Anemia in chronic kidney disease: Secondary | ICD-10-CM | POA: Diagnosis not present

## 2017-12-03 DIAGNOSIS — N2581 Secondary hyperparathyroidism of renal origin: Secondary | ICD-10-CM | POA: Diagnosis not present

## 2017-12-03 DIAGNOSIS — N2589 Other disorders resulting from impaired renal tubular function: Secondary | ICD-10-CM | POA: Diagnosis not present

## 2017-12-03 DIAGNOSIS — Z992 Dependence on renal dialysis: Secondary | ICD-10-CM | POA: Diagnosis not present

## 2017-12-03 DIAGNOSIS — Z79899 Other long term (current) drug therapy: Secondary | ICD-10-CM | POA: Diagnosis not present

## 2017-12-03 DIAGNOSIS — Z131 Encounter for screening for diabetes mellitus: Secondary | ICD-10-CM | POA: Diagnosis not present

## 2017-12-03 DIAGNOSIS — D509 Iron deficiency anemia, unspecified: Secondary | ICD-10-CM | POA: Diagnosis not present

## 2017-12-03 DIAGNOSIS — E872 Acidosis: Secondary | ICD-10-CM | POA: Diagnosis not present

## 2017-12-03 DIAGNOSIS — N186 End stage renal disease: Secondary | ICD-10-CM | POA: Diagnosis not present

## 2017-12-03 DIAGNOSIS — E440 Moderate protein-calorie malnutrition: Secondary | ICD-10-CM | POA: Diagnosis not present

## 2017-12-04 DIAGNOSIS — N186 End stage renal disease: Secondary | ICD-10-CM | POA: Diagnosis not present

## 2017-12-04 DIAGNOSIS — D509 Iron deficiency anemia, unspecified: Secondary | ICD-10-CM | POA: Diagnosis not present

## 2017-12-04 DIAGNOSIS — N2581 Secondary hyperparathyroidism of renal origin: Secondary | ICD-10-CM | POA: Diagnosis not present

## 2017-12-04 DIAGNOSIS — E440 Moderate protein-calorie malnutrition: Secondary | ICD-10-CM | POA: Diagnosis not present

## 2017-12-04 DIAGNOSIS — Z131 Encounter for screening for diabetes mellitus: Secondary | ICD-10-CM | POA: Diagnosis not present

## 2017-12-04 DIAGNOSIS — E872 Acidosis: Secondary | ICD-10-CM | POA: Diagnosis not present

## 2017-12-05 DIAGNOSIS — E7849 Other hyperlipidemia: Secondary | ICD-10-CM | POA: Diagnosis not present

## 2017-12-05 DIAGNOSIS — E872 Acidosis: Secondary | ICD-10-CM | POA: Diagnosis not present

## 2017-12-05 DIAGNOSIS — D509 Iron deficiency anemia, unspecified: Secondary | ICD-10-CM | POA: Diagnosis not present

## 2017-12-05 DIAGNOSIS — N186 End stage renal disease: Secondary | ICD-10-CM | POA: Diagnosis not present

## 2017-12-05 DIAGNOSIS — N2581 Secondary hyperparathyroidism of renal origin: Secondary | ICD-10-CM | POA: Diagnosis not present

## 2017-12-05 DIAGNOSIS — R82998 Other abnormal findings in urine: Secondary | ICD-10-CM | POA: Diagnosis not present

## 2017-12-05 DIAGNOSIS — E440 Moderate protein-calorie malnutrition: Secondary | ICD-10-CM | POA: Diagnosis not present

## 2017-12-05 DIAGNOSIS — Z131 Encounter for screening for diabetes mellitus: Secondary | ICD-10-CM | POA: Diagnosis not present

## 2017-12-06 DIAGNOSIS — N2581 Secondary hyperparathyroidism of renal origin: Secondary | ICD-10-CM | POA: Diagnosis not present

## 2017-12-06 DIAGNOSIS — E872 Acidosis: Secondary | ICD-10-CM | POA: Diagnosis not present

## 2017-12-06 DIAGNOSIS — N186 End stage renal disease: Secondary | ICD-10-CM | POA: Diagnosis not present

## 2017-12-06 DIAGNOSIS — Z131 Encounter for screening for diabetes mellitus: Secondary | ICD-10-CM | POA: Diagnosis not present

## 2017-12-06 DIAGNOSIS — D509 Iron deficiency anemia, unspecified: Secondary | ICD-10-CM | POA: Diagnosis not present

## 2017-12-06 DIAGNOSIS — E440 Moderate protein-calorie malnutrition: Secondary | ICD-10-CM | POA: Diagnosis not present

## 2017-12-07 DIAGNOSIS — E872 Acidosis: Secondary | ICD-10-CM | POA: Diagnosis not present

## 2017-12-07 DIAGNOSIS — N186 End stage renal disease: Secondary | ICD-10-CM | POA: Diagnosis not present

## 2017-12-07 DIAGNOSIS — N2581 Secondary hyperparathyroidism of renal origin: Secondary | ICD-10-CM | POA: Diagnosis not present

## 2017-12-07 DIAGNOSIS — D509 Iron deficiency anemia, unspecified: Secondary | ICD-10-CM | POA: Diagnosis not present

## 2017-12-07 DIAGNOSIS — E440 Moderate protein-calorie malnutrition: Secondary | ICD-10-CM | POA: Diagnosis not present

## 2017-12-07 DIAGNOSIS — Z131 Encounter for screening for diabetes mellitus: Secondary | ICD-10-CM | POA: Diagnosis not present

## 2017-12-08 DIAGNOSIS — N186 End stage renal disease: Secondary | ICD-10-CM | POA: Diagnosis not present

## 2017-12-08 DIAGNOSIS — E872 Acidosis: Secondary | ICD-10-CM | POA: Diagnosis not present

## 2017-12-08 DIAGNOSIS — Z131 Encounter for screening for diabetes mellitus: Secondary | ICD-10-CM | POA: Diagnosis not present

## 2017-12-08 DIAGNOSIS — E440 Moderate protein-calorie malnutrition: Secondary | ICD-10-CM | POA: Diagnosis not present

## 2017-12-08 DIAGNOSIS — N2581 Secondary hyperparathyroidism of renal origin: Secondary | ICD-10-CM | POA: Diagnosis not present

## 2017-12-08 DIAGNOSIS — D509 Iron deficiency anemia, unspecified: Secondary | ICD-10-CM | POA: Diagnosis not present

## 2017-12-09 DIAGNOSIS — D509 Iron deficiency anemia, unspecified: Secondary | ICD-10-CM | POA: Diagnosis not present

## 2017-12-09 DIAGNOSIS — E872 Acidosis: Secondary | ICD-10-CM | POA: Diagnosis not present

## 2017-12-09 DIAGNOSIS — Z131 Encounter for screening for diabetes mellitus: Secondary | ICD-10-CM | POA: Diagnosis not present

## 2017-12-09 DIAGNOSIS — N186 End stage renal disease: Secondary | ICD-10-CM | POA: Diagnosis not present

## 2017-12-09 DIAGNOSIS — E440 Moderate protein-calorie malnutrition: Secondary | ICD-10-CM | POA: Diagnosis not present

## 2017-12-09 DIAGNOSIS — N2581 Secondary hyperparathyroidism of renal origin: Secondary | ICD-10-CM | POA: Diagnosis not present

## 2017-12-10 DIAGNOSIS — Z131 Encounter for screening for diabetes mellitus: Secondary | ICD-10-CM | POA: Diagnosis not present

## 2017-12-10 DIAGNOSIS — D509 Iron deficiency anemia, unspecified: Secondary | ICD-10-CM | POA: Diagnosis not present

## 2017-12-10 DIAGNOSIS — E440 Moderate protein-calorie malnutrition: Secondary | ICD-10-CM | POA: Diagnosis not present

## 2017-12-10 DIAGNOSIS — N186 End stage renal disease: Secondary | ICD-10-CM | POA: Diagnosis not present

## 2017-12-10 DIAGNOSIS — N2581 Secondary hyperparathyroidism of renal origin: Secondary | ICD-10-CM | POA: Diagnosis not present

## 2017-12-10 DIAGNOSIS — Z7682 Awaiting organ transplant status: Secondary | ICD-10-CM | POA: Diagnosis not present

## 2017-12-10 DIAGNOSIS — E872 Acidosis: Secondary | ICD-10-CM | POA: Diagnosis not present

## 2017-12-11 DIAGNOSIS — E440 Moderate protein-calorie malnutrition: Secondary | ICD-10-CM | POA: Diagnosis not present

## 2017-12-11 DIAGNOSIS — D509 Iron deficiency anemia, unspecified: Secondary | ICD-10-CM | POA: Diagnosis not present

## 2017-12-11 DIAGNOSIS — N186 End stage renal disease: Secondary | ICD-10-CM | POA: Diagnosis not present

## 2017-12-11 DIAGNOSIS — Z131 Encounter for screening for diabetes mellitus: Secondary | ICD-10-CM | POA: Diagnosis not present

## 2017-12-11 DIAGNOSIS — E872 Acidosis: Secondary | ICD-10-CM | POA: Diagnosis not present

## 2017-12-11 DIAGNOSIS — N2581 Secondary hyperparathyroidism of renal origin: Secondary | ICD-10-CM | POA: Diagnosis not present

## 2017-12-12 DIAGNOSIS — E440 Moderate protein-calorie malnutrition: Secondary | ICD-10-CM | POA: Diagnosis not present

## 2017-12-12 DIAGNOSIS — N186 End stage renal disease: Secondary | ICD-10-CM | POA: Diagnosis not present

## 2017-12-12 DIAGNOSIS — N2581 Secondary hyperparathyroidism of renal origin: Secondary | ICD-10-CM | POA: Diagnosis not present

## 2017-12-12 DIAGNOSIS — Z131 Encounter for screening for diabetes mellitus: Secondary | ICD-10-CM | POA: Diagnosis not present

## 2017-12-12 DIAGNOSIS — E872 Acidosis: Secondary | ICD-10-CM | POA: Diagnosis not present

## 2017-12-12 DIAGNOSIS — D509 Iron deficiency anemia, unspecified: Secondary | ICD-10-CM | POA: Diagnosis not present

## 2017-12-13 DIAGNOSIS — N2581 Secondary hyperparathyroidism of renal origin: Secondary | ICD-10-CM | POA: Diagnosis not present

## 2017-12-13 DIAGNOSIS — D509 Iron deficiency anemia, unspecified: Secondary | ICD-10-CM | POA: Diagnosis not present

## 2017-12-13 DIAGNOSIS — E440 Moderate protein-calorie malnutrition: Secondary | ICD-10-CM | POA: Diagnosis not present

## 2017-12-13 DIAGNOSIS — E872 Acidosis: Secondary | ICD-10-CM | POA: Diagnosis not present

## 2017-12-13 DIAGNOSIS — N186 End stage renal disease: Secondary | ICD-10-CM | POA: Diagnosis not present

## 2017-12-13 DIAGNOSIS — Z131 Encounter for screening for diabetes mellitus: Secondary | ICD-10-CM | POA: Diagnosis not present

## 2017-12-14 DIAGNOSIS — E872 Acidosis: Secondary | ICD-10-CM | POA: Diagnosis not present

## 2017-12-14 DIAGNOSIS — N186 End stage renal disease: Secondary | ICD-10-CM | POA: Diagnosis not present

## 2017-12-14 DIAGNOSIS — N2581 Secondary hyperparathyroidism of renal origin: Secondary | ICD-10-CM | POA: Diagnosis not present

## 2017-12-14 DIAGNOSIS — Z131 Encounter for screening for diabetes mellitus: Secondary | ICD-10-CM | POA: Diagnosis not present

## 2017-12-14 DIAGNOSIS — D509 Iron deficiency anemia, unspecified: Secondary | ICD-10-CM | POA: Diagnosis not present

## 2017-12-14 DIAGNOSIS — E440 Moderate protein-calorie malnutrition: Secondary | ICD-10-CM | POA: Diagnosis not present

## 2017-12-15 DIAGNOSIS — N2581 Secondary hyperparathyroidism of renal origin: Secondary | ICD-10-CM | POA: Diagnosis not present

## 2017-12-15 DIAGNOSIS — E872 Acidosis: Secondary | ICD-10-CM | POA: Diagnosis not present

## 2017-12-15 DIAGNOSIS — N186 End stage renal disease: Secondary | ICD-10-CM | POA: Diagnosis not present

## 2017-12-15 DIAGNOSIS — E440 Moderate protein-calorie malnutrition: Secondary | ICD-10-CM | POA: Diagnosis not present

## 2017-12-15 DIAGNOSIS — Z131 Encounter for screening for diabetes mellitus: Secondary | ICD-10-CM | POA: Diagnosis not present

## 2017-12-15 DIAGNOSIS — D509 Iron deficiency anemia, unspecified: Secondary | ICD-10-CM | POA: Diagnosis not present

## 2017-12-16 DIAGNOSIS — Z131 Encounter for screening for diabetes mellitus: Secondary | ICD-10-CM | POA: Diagnosis not present

## 2017-12-16 DIAGNOSIS — E872 Acidosis: Secondary | ICD-10-CM | POA: Diagnosis not present

## 2017-12-16 DIAGNOSIS — N2581 Secondary hyperparathyroidism of renal origin: Secondary | ICD-10-CM | POA: Diagnosis not present

## 2017-12-16 DIAGNOSIS — E440 Moderate protein-calorie malnutrition: Secondary | ICD-10-CM | POA: Diagnosis not present

## 2017-12-16 DIAGNOSIS — N186 End stage renal disease: Secondary | ICD-10-CM | POA: Diagnosis not present

## 2017-12-16 DIAGNOSIS — D509 Iron deficiency anemia, unspecified: Secondary | ICD-10-CM | POA: Diagnosis not present

## 2017-12-17 DIAGNOSIS — D509 Iron deficiency anemia, unspecified: Secondary | ICD-10-CM | POA: Diagnosis not present

## 2017-12-17 DIAGNOSIS — N2581 Secondary hyperparathyroidism of renal origin: Secondary | ICD-10-CM | POA: Diagnosis not present

## 2017-12-17 DIAGNOSIS — E440 Moderate protein-calorie malnutrition: Secondary | ICD-10-CM | POA: Diagnosis not present

## 2017-12-17 DIAGNOSIS — Z131 Encounter for screening for diabetes mellitus: Secondary | ICD-10-CM | POA: Diagnosis not present

## 2017-12-17 DIAGNOSIS — E872 Acidosis: Secondary | ICD-10-CM | POA: Diagnosis not present

## 2017-12-17 DIAGNOSIS — N186 End stage renal disease: Secondary | ICD-10-CM | POA: Diagnosis not present

## 2017-12-18 DIAGNOSIS — N2581 Secondary hyperparathyroidism of renal origin: Secondary | ICD-10-CM | POA: Diagnosis not present

## 2017-12-18 DIAGNOSIS — D509 Iron deficiency anemia, unspecified: Secondary | ICD-10-CM | POA: Diagnosis not present

## 2017-12-18 DIAGNOSIS — E872 Acidosis: Secondary | ICD-10-CM | POA: Diagnosis not present

## 2017-12-18 DIAGNOSIS — N186 End stage renal disease: Secondary | ICD-10-CM | POA: Diagnosis not present

## 2017-12-18 DIAGNOSIS — E440 Moderate protein-calorie malnutrition: Secondary | ICD-10-CM | POA: Diagnosis not present

## 2017-12-18 DIAGNOSIS — Z131 Encounter for screening for diabetes mellitus: Secondary | ICD-10-CM | POA: Diagnosis not present

## 2017-12-19 DIAGNOSIS — N2581 Secondary hyperparathyroidism of renal origin: Secondary | ICD-10-CM | POA: Diagnosis not present

## 2017-12-19 DIAGNOSIS — Z131 Encounter for screening for diabetes mellitus: Secondary | ICD-10-CM | POA: Diagnosis not present

## 2017-12-19 DIAGNOSIS — D509 Iron deficiency anemia, unspecified: Secondary | ICD-10-CM | POA: Diagnosis not present

## 2017-12-19 DIAGNOSIS — N186 End stage renal disease: Secondary | ICD-10-CM | POA: Diagnosis not present

## 2017-12-19 DIAGNOSIS — E440 Moderate protein-calorie malnutrition: Secondary | ICD-10-CM | POA: Diagnosis not present

## 2017-12-19 DIAGNOSIS — E872 Acidosis: Secondary | ICD-10-CM | POA: Diagnosis not present

## 2017-12-20 DIAGNOSIS — N186 End stage renal disease: Secondary | ICD-10-CM | POA: Diagnosis not present

## 2017-12-20 DIAGNOSIS — Z131 Encounter for screening for diabetes mellitus: Secondary | ICD-10-CM | POA: Diagnosis not present

## 2017-12-20 DIAGNOSIS — N2581 Secondary hyperparathyroidism of renal origin: Secondary | ICD-10-CM | POA: Diagnosis not present

## 2017-12-20 DIAGNOSIS — D509 Iron deficiency anemia, unspecified: Secondary | ICD-10-CM | POA: Diagnosis not present

## 2017-12-20 DIAGNOSIS — E440 Moderate protein-calorie malnutrition: Secondary | ICD-10-CM | POA: Diagnosis not present

## 2017-12-20 DIAGNOSIS — E872 Acidosis: Secondary | ICD-10-CM | POA: Diagnosis not present

## 2017-12-21 DIAGNOSIS — N186 End stage renal disease: Secondary | ICD-10-CM | POA: Diagnosis not present

## 2017-12-21 DIAGNOSIS — Z131 Encounter for screening for diabetes mellitus: Secondary | ICD-10-CM | POA: Diagnosis not present

## 2017-12-21 DIAGNOSIS — N2581 Secondary hyperparathyroidism of renal origin: Secondary | ICD-10-CM | POA: Diagnosis not present

## 2017-12-21 DIAGNOSIS — E440 Moderate protein-calorie malnutrition: Secondary | ICD-10-CM | POA: Diagnosis not present

## 2017-12-21 DIAGNOSIS — D509 Iron deficiency anemia, unspecified: Secondary | ICD-10-CM | POA: Diagnosis not present

## 2017-12-21 DIAGNOSIS — E872 Acidosis: Secondary | ICD-10-CM | POA: Diagnosis not present

## 2017-12-22 DIAGNOSIS — E440 Moderate protein-calorie malnutrition: Secondary | ICD-10-CM | POA: Diagnosis not present

## 2017-12-22 DIAGNOSIS — Z131 Encounter for screening for diabetes mellitus: Secondary | ICD-10-CM | POA: Diagnosis not present

## 2017-12-22 DIAGNOSIS — E872 Acidosis: Secondary | ICD-10-CM | POA: Diagnosis not present

## 2017-12-22 DIAGNOSIS — N186 End stage renal disease: Secondary | ICD-10-CM | POA: Diagnosis not present

## 2017-12-22 DIAGNOSIS — D509 Iron deficiency anemia, unspecified: Secondary | ICD-10-CM | POA: Diagnosis not present

## 2017-12-22 DIAGNOSIS — N2581 Secondary hyperparathyroidism of renal origin: Secondary | ICD-10-CM | POA: Diagnosis not present

## 2017-12-23 DIAGNOSIS — Z131 Encounter for screening for diabetes mellitus: Secondary | ICD-10-CM | POA: Diagnosis not present

## 2017-12-23 DIAGNOSIS — N2581 Secondary hyperparathyroidism of renal origin: Secondary | ICD-10-CM | POA: Diagnosis not present

## 2017-12-23 DIAGNOSIS — E872 Acidosis: Secondary | ICD-10-CM | POA: Diagnosis not present

## 2017-12-23 DIAGNOSIS — N186 End stage renal disease: Secondary | ICD-10-CM | POA: Diagnosis not present

## 2017-12-23 DIAGNOSIS — D509 Iron deficiency anemia, unspecified: Secondary | ICD-10-CM | POA: Diagnosis not present

## 2017-12-23 DIAGNOSIS — E440 Moderate protein-calorie malnutrition: Secondary | ICD-10-CM | POA: Diagnosis not present

## 2017-12-24 DIAGNOSIS — Z131 Encounter for screening for diabetes mellitus: Secondary | ICD-10-CM | POA: Diagnosis not present

## 2017-12-24 DIAGNOSIS — E440 Moderate protein-calorie malnutrition: Secondary | ICD-10-CM | POA: Diagnosis not present

## 2017-12-24 DIAGNOSIS — N2581 Secondary hyperparathyroidism of renal origin: Secondary | ICD-10-CM | POA: Diagnosis not present

## 2017-12-24 DIAGNOSIS — N186 End stage renal disease: Secondary | ICD-10-CM | POA: Diagnosis not present

## 2017-12-24 DIAGNOSIS — D509 Iron deficiency anemia, unspecified: Secondary | ICD-10-CM | POA: Diagnosis not present

## 2017-12-24 DIAGNOSIS — E872 Acidosis: Secondary | ICD-10-CM | POA: Diagnosis not present

## 2017-12-25 DIAGNOSIS — E440 Moderate protein-calorie malnutrition: Secondary | ICD-10-CM | POA: Diagnosis not present

## 2017-12-25 DIAGNOSIS — D509 Iron deficiency anemia, unspecified: Secondary | ICD-10-CM | POA: Diagnosis not present

## 2017-12-25 DIAGNOSIS — E872 Acidosis: Secondary | ICD-10-CM | POA: Diagnosis not present

## 2017-12-25 DIAGNOSIS — N186 End stage renal disease: Secondary | ICD-10-CM | POA: Diagnosis not present

## 2017-12-25 DIAGNOSIS — N2581 Secondary hyperparathyroidism of renal origin: Secondary | ICD-10-CM | POA: Diagnosis not present

## 2017-12-25 DIAGNOSIS — Z131 Encounter for screening for diabetes mellitus: Secondary | ICD-10-CM | POA: Diagnosis not present

## 2017-12-26 DIAGNOSIS — E872 Acidosis: Secondary | ICD-10-CM | POA: Diagnosis not present

## 2017-12-26 DIAGNOSIS — N186 End stage renal disease: Secondary | ICD-10-CM | POA: Diagnosis not present

## 2017-12-26 DIAGNOSIS — E440 Moderate protein-calorie malnutrition: Secondary | ICD-10-CM | POA: Diagnosis not present

## 2017-12-26 DIAGNOSIS — D509 Iron deficiency anemia, unspecified: Secondary | ICD-10-CM | POA: Diagnosis not present

## 2017-12-26 DIAGNOSIS — Z131 Encounter for screening for diabetes mellitus: Secondary | ICD-10-CM | POA: Diagnosis not present

## 2017-12-26 DIAGNOSIS — N2581 Secondary hyperparathyroidism of renal origin: Secondary | ICD-10-CM | POA: Diagnosis not present

## 2017-12-27 DIAGNOSIS — D509 Iron deficiency anemia, unspecified: Secondary | ICD-10-CM | POA: Diagnosis not present

## 2017-12-27 DIAGNOSIS — N2581 Secondary hyperparathyroidism of renal origin: Secondary | ICD-10-CM | POA: Diagnosis not present

## 2017-12-27 DIAGNOSIS — N186 End stage renal disease: Secondary | ICD-10-CM | POA: Diagnosis not present

## 2017-12-27 DIAGNOSIS — Z131 Encounter for screening for diabetes mellitus: Secondary | ICD-10-CM | POA: Diagnosis not present

## 2017-12-27 DIAGNOSIS — E872 Acidosis: Secondary | ICD-10-CM | POA: Diagnosis not present

## 2017-12-27 DIAGNOSIS — E440 Moderate protein-calorie malnutrition: Secondary | ICD-10-CM | POA: Diagnosis not present

## 2017-12-28 DIAGNOSIS — E872 Acidosis: Secondary | ICD-10-CM | POA: Diagnosis not present

## 2017-12-28 DIAGNOSIS — Z131 Encounter for screening for diabetes mellitus: Secondary | ICD-10-CM | POA: Diagnosis not present

## 2017-12-28 DIAGNOSIS — N186 End stage renal disease: Secondary | ICD-10-CM | POA: Diagnosis not present

## 2017-12-28 DIAGNOSIS — D509 Iron deficiency anemia, unspecified: Secondary | ICD-10-CM | POA: Diagnosis not present

## 2017-12-28 DIAGNOSIS — E440 Moderate protein-calorie malnutrition: Secondary | ICD-10-CM | POA: Diagnosis not present

## 2017-12-28 DIAGNOSIS — N2581 Secondary hyperparathyroidism of renal origin: Secondary | ICD-10-CM | POA: Diagnosis not present

## 2017-12-29 DIAGNOSIS — N186 End stage renal disease: Secondary | ICD-10-CM | POA: Diagnosis not present

## 2017-12-29 DIAGNOSIS — D509 Iron deficiency anemia, unspecified: Secondary | ICD-10-CM | POA: Diagnosis not present

## 2017-12-29 DIAGNOSIS — E440 Moderate protein-calorie malnutrition: Secondary | ICD-10-CM | POA: Diagnosis not present

## 2017-12-29 DIAGNOSIS — Z131 Encounter for screening for diabetes mellitus: Secondary | ICD-10-CM | POA: Diagnosis not present

## 2017-12-29 DIAGNOSIS — E872 Acidosis: Secondary | ICD-10-CM | POA: Diagnosis not present

## 2017-12-29 DIAGNOSIS — N2581 Secondary hyperparathyroidism of renal origin: Secondary | ICD-10-CM | POA: Diagnosis not present

## 2017-12-30 DIAGNOSIS — D509 Iron deficiency anemia, unspecified: Secondary | ICD-10-CM | POA: Diagnosis not present

## 2017-12-30 DIAGNOSIS — E440 Moderate protein-calorie malnutrition: Secondary | ICD-10-CM | POA: Diagnosis not present

## 2017-12-30 DIAGNOSIS — N2581 Secondary hyperparathyroidism of renal origin: Secondary | ICD-10-CM | POA: Diagnosis not present

## 2017-12-30 DIAGNOSIS — E872 Acidosis: Secondary | ICD-10-CM | POA: Diagnosis not present

## 2017-12-30 DIAGNOSIS — Z131 Encounter for screening for diabetes mellitus: Secondary | ICD-10-CM | POA: Diagnosis not present

## 2017-12-30 DIAGNOSIS — N186 End stage renal disease: Secondary | ICD-10-CM | POA: Diagnosis not present

## 2017-12-31 DIAGNOSIS — E872 Acidosis: Secondary | ICD-10-CM | POA: Diagnosis not present

## 2017-12-31 DIAGNOSIS — Z131 Encounter for screening for diabetes mellitus: Secondary | ICD-10-CM | POA: Diagnosis not present

## 2017-12-31 DIAGNOSIS — D509 Iron deficiency anemia, unspecified: Secondary | ICD-10-CM | POA: Diagnosis not present

## 2017-12-31 DIAGNOSIS — N2581 Secondary hyperparathyroidism of renal origin: Secondary | ICD-10-CM | POA: Diagnosis not present

## 2017-12-31 DIAGNOSIS — E440 Moderate protein-calorie malnutrition: Secondary | ICD-10-CM | POA: Diagnosis not present

## 2017-12-31 DIAGNOSIS — N186 End stage renal disease: Secondary | ICD-10-CM | POA: Diagnosis not present

## 2018-01-01 DIAGNOSIS — E872 Acidosis: Secondary | ICD-10-CM | POA: Diagnosis not present

## 2018-01-01 DIAGNOSIS — E440 Moderate protein-calorie malnutrition: Secondary | ICD-10-CM | POA: Diagnosis not present

## 2018-01-01 DIAGNOSIS — N186 End stage renal disease: Secondary | ICD-10-CM | POA: Diagnosis not present

## 2018-01-01 DIAGNOSIS — Z131 Encounter for screening for diabetes mellitus: Secondary | ICD-10-CM | POA: Diagnosis not present

## 2018-01-01 DIAGNOSIS — N2581 Secondary hyperparathyroidism of renal origin: Secondary | ICD-10-CM | POA: Diagnosis not present

## 2018-01-01 DIAGNOSIS — D509 Iron deficiency anemia, unspecified: Secondary | ICD-10-CM | POA: Diagnosis not present

## 2018-01-02 DIAGNOSIS — N186 End stage renal disease: Secondary | ICD-10-CM | POA: Diagnosis not present

## 2018-01-02 DIAGNOSIS — N2889 Other specified disorders of kidney and ureter: Secondary | ICD-10-CM | POA: Diagnosis not present

## 2018-01-02 DIAGNOSIS — Z992 Dependence on renal dialysis: Secondary | ICD-10-CM | POA: Diagnosis not present

## 2018-01-02 DIAGNOSIS — Z79899 Other long term (current) drug therapy: Secondary | ICD-10-CM | POA: Diagnosis not present

## 2018-01-02 DIAGNOSIS — D509 Iron deficiency anemia, unspecified: Secondary | ICD-10-CM | POA: Diagnosis not present

## 2018-01-02 DIAGNOSIS — D631 Anemia in chronic kidney disease: Secondary | ICD-10-CM | POA: Diagnosis not present

## 2018-01-02 DIAGNOSIS — N2581 Secondary hyperparathyroidism of renal origin: Secondary | ICD-10-CM | POA: Diagnosis not present

## 2018-01-02 DIAGNOSIS — E440 Moderate protein-calorie malnutrition: Secondary | ICD-10-CM | POA: Diagnosis not present

## 2018-01-03 DIAGNOSIS — E440 Moderate protein-calorie malnutrition: Secondary | ICD-10-CM | POA: Diagnosis not present

## 2018-01-03 DIAGNOSIS — D509 Iron deficiency anemia, unspecified: Secondary | ICD-10-CM | POA: Diagnosis not present

## 2018-01-03 DIAGNOSIS — N186 End stage renal disease: Secondary | ICD-10-CM | POA: Diagnosis not present

## 2018-01-03 DIAGNOSIS — D631 Anemia in chronic kidney disease: Secondary | ICD-10-CM | POA: Diagnosis not present

## 2018-01-03 DIAGNOSIS — N2581 Secondary hyperparathyroidism of renal origin: Secondary | ICD-10-CM | POA: Diagnosis not present

## 2018-01-03 DIAGNOSIS — Z79899 Other long term (current) drug therapy: Secondary | ICD-10-CM | POA: Diagnosis not present

## 2018-01-04 DIAGNOSIS — R82998 Other abnormal findings in urine: Secondary | ICD-10-CM | POA: Diagnosis not present

## 2018-01-04 DIAGNOSIS — N2581 Secondary hyperparathyroidism of renal origin: Secondary | ICD-10-CM | POA: Diagnosis not present

## 2018-01-04 DIAGNOSIS — E440 Moderate protein-calorie malnutrition: Secondary | ICD-10-CM | POA: Diagnosis not present

## 2018-01-04 DIAGNOSIS — D631 Anemia in chronic kidney disease: Secondary | ICD-10-CM | POA: Diagnosis not present

## 2018-01-04 DIAGNOSIS — N186 End stage renal disease: Secondary | ICD-10-CM | POA: Diagnosis not present

## 2018-01-04 DIAGNOSIS — Z7682 Awaiting organ transplant status: Secondary | ICD-10-CM | POA: Diagnosis not present

## 2018-01-04 DIAGNOSIS — Z79899 Other long term (current) drug therapy: Secondary | ICD-10-CM | POA: Diagnosis not present

## 2018-01-04 DIAGNOSIS — D509 Iron deficiency anemia, unspecified: Secondary | ICD-10-CM | POA: Diagnosis not present

## 2018-01-05 DIAGNOSIS — D631 Anemia in chronic kidney disease: Secondary | ICD-10-CM | POA: Diagnosis not present

## 2018-01-05 DIAGNOSIS — D509 Iron deficiency anemia, unspecified: Secondary | ICD-10-CM | POA: Diagnosis not present

## 2018-01-05 DIAGNOSIS — N2581 Secondary hyperparathyroidism of renal origin: Secondary | ICD-10-CM | POA: Diagnosis not present

## 2018-01-05 DIAGNOSIS — Z79899 Other long term (current) drug therapy: Secondary | ICD-10-CM | POA: Diagnosis not present

## 2018-01-05 DIAGNOSIS — N186 End stage renal disease: Secondary | ICD-10-CM | POA: Diagnosis not present

## 2018-01-05 DIAGNOSIS — E440 Moderate protein-calorie malnutrition: Secondary | ICD-10-CM | POA: Diagnosis not present

## 2018-01-06 DIAGNOSIS — E440 Moderate protein-calorie malnutrition: Secondary | ICD-10-CM | POA: Diagnosis not present

## 2018-01-06 DIAGNOSIS — N186 End stage renal disease: Secondary | ICD-10-CM | POA: Diagnosis not present

## 2018-01-06 DIAGNOSIS — D509 Iron deficiency anemia, unspecified: Secondary | ICD-10-CM | POA: Diagnosis not present

## 2018-01-06 DIAGNOSIS — D631 Anemia in chronic kidney disease: Secondary | ICD-10-CM | POA: Diagnosis not present

## 2018-01-06 DIAGNOSIS — Z79899 Other long term (current) drug therapy: Secondary | ICD-10-CM | POA: Diagnosis not present

## 2018-01-06 DIAGNOSIS — N2581 Secondary hyperparathyroidism of renal origin: Secondary | ICD-10-CM | POA: Diagnosis not present

## 2018-01-07 DIAGNOSIS — D631 Anemia in chronic kidney disease: Secondary | ICD-10-CM | POA: Diagnosis not present

## 2018-01-07 DIAGNOSIS — N186 End stage renal disease: Secondary | ICD-10-CM | POA: Diagnosis not present

## 2018-01-07 DIAGNOSIS — D509 Iron deficiency anemia, unspecified: Secondary | ICD-10-CM | POA: Diagnosis not present

## 2018-01-07 DIAGNOSIS — E440 Moderate protein-calorie malnutrition: Secondary | ICD-10-CM | POA: Diagnosis not present

## 2018-01-07 DIAGNOSIS — Z79899 Other long term (current) drug therapy: Secondary | ICD-10-CM | POA: Diagnosis not present

## 2018-01-07 DIAGNOSIS — N2581 Secondary hyperparathyroidism of renal origin: Secondary | ICD-10-CM | POA: Diagnosis not present

## 2018-01-08 DIAGNOSIS — D631 Anemia in chronic kidney disease: Secondary | ICD-10-CM | POA: Diagnosis not present

## 2018-01-08 DIAGNOSIS — Z79899 Other long term (current) drug therapy: Secondary | ICD-10-CM | POA: Diagnosis not present

## 2018-01-08 DIAGNOSIS — E440 Moderate protein-calorie malnutrition: Secondary | ICD-10-CM | POA: Diagnosis not present

## 2018-01-08 DIAGNOSIS — N186 End stage renal disease: Secondary | ICD-10-CM | POA: Diagnosis not present

## 2018-01-08 DIAGNOSIS — N2581 Secondary hyperparathyroidism of renal origin: Secondary | ICD-10-CM | POA: Diagnosis not present

## 2018-01-08 DIAGNOSIS — D509 Iron deficiency anemia, unspecified: Secondary | ICD-10-CM | POA: Diagnosis not present

## 2018-01-09 DIAGNOSIS — D631 Anemia in chronic kidney disease: Secondary | ICD-10-CM | POA: Diagnosis not present

## 2018-01-09 DIAGNOSIS — E440 Moderate protein-calorie malnutrition: Secondary | ICD-10-CM | POA: Diagnosis not present

## 2018-01-09 DIAGNOSIS — Z79899 Other long term (current) drug therapy: Secondary | ICD-10-CM | POA: Diagnosis not present

## 2018-01-09 DIAGNOSIS — N2581 Secondary hyperparathyroidism of renal origin: Secondary | ICD-10-CM | POA: Diagnosis not present

## 2018-01-09 DIAGNOSIS — N186 End stage renal disease: Secondary | ICD-10-CM | POA: Diagnosis not present

## 2018-01-09 DIAGNOSIS — D509 Iron deficiency anemia, unspecified: Secondary | ICD-10-CM | POA: Diagnosis not present

## 2018-01-10 DIAGNOSIS — E440 Moderate protein-calorie malnutrition: Secondary | ICD-10-CM | POA: Diagnosis not present

## 2018-01-10 DIAGNOSIS — N186 End stage renal disease: Secondary | ICD-10-CM | POA: Diagnosis not present

## 2018-01-10 DIAGNOSIS — Z79899 Other long term (current) drug therapy: Secondary | ICD-10-CM | POA: Diagnosis not present

## 2018-01-10 DIAGNOSIS — D509 Iron deficiency anemia, unspecified: Secondary | ICD-10-CM | POA: Diagnosis not present

## 2018-01-10 DIAGNOSIS — D631 Anemia in chronic kidney disease: Secondary | ICD-10-CM | POA: Diagnosis not present

## 2018-01-10 DIAGNOSIS — N2581 Secondary hyperparathyroidism of renal origin: Secondary | ICD-10-CM | POA: Diagnosis not present

## 2018-01-11 DIAGNOSIS — N186 End stage renal disease: Secondary | ICD-10-CM | POA: Diagnosis not present

## 2018-01-11 DIAGNOSIS — E440 Moderate protein-calorie malnutrition: Secondary | ICD-10-CM | POA: Diagnosis not present

## 2018-01-11 DIAGNOSIS — D509 Iron deficiency anemia, unspecified: Secondary | ICD-10-CM | POA: Diagnosis not present

## 2018-01-11 DIAGNOSIS — D631 Anemia in chronic kidney disease: Secondary | ICD-10-CM | POA: Diagnosis not present

## 2018-01-11 DIAGNOSIS — N2581 Secondary hyperparathyroidism of renal origin: Secondary | ICD-10-CM | POA: Diagnosis not present

## 2018-01-11 DIAGNOSIS — Z79899 Other long term (current) drug therapy: Secondary | ICD-10-CM | POA: Diagnosis not present

## 2018-01-12 DIAGNOSIS — Z79899 Other long term (current) drug therapy: Secondary | ICD-10-CM | POA: Diagnosis not present

## 2018-01-12 DIAGNOSIS — N2581 Secondary hyperparathyroidism of renal origin: Secondary | ICD-10-CM | POA: Diagnosis not present

## 2018-01-12 DIAGNOSIS — D631 Anemia in chronic kidney disease: Secondary | ICD-10-CM | POA: Diagnosis not present

## 2018-01-12 DIAGNOSIS — E440 Moderate protein-calorie malnutrition: Secondary | ICD-10-CM | POA: Diagnosis not present

## 2018-01-12 DIAGNOSIS — N186 End stage renal disease: Secondary | ICD-10-CM | POA: Diagnosis not present

## 2018-01-12 DIAGNOSIS — D509 Iron deficiency anemia, unspecified: Secondary | ICD-10-CM | POA: Diagnosis not present

## 2018-01-13 DIAGNOSIS — E440 Moderate protein-calorie malnutrition: Secondary | ICD-10-CM | POA: Diagnosis not present

## 2018-01-13 DIAGNOSIS — N2581 Secondary hyperparathyroidism of renal origin: Secondary | ICD-10-CM | POA: Diagnosis not present

## 2018-01-13 DIAGNOSIS — N186 End stage renal disease: Secondary | ICD-10-CM | POA: Diagnosis not present

## 2018-01-13 DIAGNOSIS — Z79899 Other long term (current) drug therapy: Secondary | ICD-10-CM | POA: Diagnosis not present

## 2018-01-13 DIAGNOSIS — D509 Iron deficiency anemia, unspecified: Secondary | ICD-10-CM | POA: Diagnosis not present

## 2018-01-13 DIAGNOSIS — D631 Anemia in chronic kidney disease: Secondary | ICD-10-CM | POA: Diagnosis not present

## 2018-01-14 DIAGNOSIS — N186 End stage renal disease: Secondary | ICD-10-CM | POA: Diagnosis not present

## 2018-01-14 DIAGNOSIS — D509 Iron deficiency anemia, unspecified: Secondary | ICD-10-CM | POA: Diagnosis not present

## 2018-01-14 DIAGNOSIS — N2581 Secondary hyperparathyroidism of renal origin: Secondary | ICD-10-CM | POA: Diagnosis not present

## 2018-01-14 DIAGNOSIS — D631 Anemia in chronic kidney disease: Secondary | ICD-10-CM | POA: Diagnosis not present

## 2018-01-14 DIAGNOSIS — E440 Moderate protein-calorie malnutrition: Secondary | ICD-10-CM | POA: Diagnosis not present

## 2018-01-14 DIAGNOSIS — Z79899 Other long term (current) drug therapy: Secondary | ICD-10-CM | POA: Diagnosis not present

## 2018-01-15 DIAGNOSIS — D631 Anemia in chronic kidney disease: Secondary | ICD-10-CM | POA: Diagnosis not present

## 2018-01-15 DIAGNOSIS — D509 Iron deficiency anemia, unspecified: Secondary | ICD-10-CM | POA: Diagnosis not present

## 2018-01-15 DIAGNOSIS — Z79899 Other long term (current) drug therapy: Secondary | ICD-10-CM | POA: Diagnosis not present

## 2018-01-15 DIAGNOSIS — N186 End stage renal disease: Secondary | ICD-10-CM | POA: Diagnosis not present

## 2018-01-15 DIAGNOSIS — N2581 Secondary hyperparathyroidism of renal origin: Secondary | ICD-10-CM | POA: Diagnosis not present

## 2018-01-15 DIAGNOSIS — E440 Moderate protein-calorie malnutrition: Secondary | ICD-10-CM | POA: Diagnosis not present

## 2018-01-16 DIAGNOSIS — Z79899 Other long term (current) drug therapy: Secondary | ICD-10-CM | POA: Diagnosis not present

## 2018-01-16 DIAGNOSIS — D509 Iron deficiency anemia, unspecified: Secondary | ICD-10-CM | POA: Diagnosis not present

## 2018-01-16 DIAGNOSIS — E440 Moderate protein-calorie malnutrition: Secondary | ICD-10-CM | POA: Diagnosis not present

## 2018-01-16 DIAGNOSIS — N186 End stage renal disease: Secondary | ICD-10-CM | POA: Diagnosis not present

## 2018-01-16 DIAGNOSIS — N2581 Secondary hyperparathyroidism of renal origin: Secondary | ICD-10-CM | POA: Diagnosis not present

## 2018-01-16 DIAGNOSIS — D631 Anemia in chronic kidney disease: Secondary | ICD-10-CM | POA: Diagnosis not present

## 2018-01-17 DIAGNOSIS — N2581 Secondary hyperparathyroidism of renal origin: Secondary | ICD-10-CM | POA: Diagnosis not present

## 2018-01-17 DIAGNOSIS — Z79899 Other long term (current) drug therapy: Secondary | ICD-10-CM | POA: Diagnosis not present

## 2018-01-17 DIAGNOSIS — N186 End stage renal disease: Secondary | ICD-10-CM | POA: Diagnosis not present

## 2018-01-17 DIAGNOSIS — D631 Anemia in chronic kidney disease: Secondary | ICD-10-CM | POA: Diagnosis not present

## 2018-01-17 DIAGNOSIS — E440 Moderate protein-calorie malnutrition: Secondary | ICD-10-CM | POA: Diagnosis not present

## 2018-01-17 DIAGNOSIS — D509 Iron deficiency anemia, unspecified: Secondary | ICD-10-CM | POA: Diagnosis not present

## 2018-01-18 DIAGNOSIS — D631 Anemia in chronic kidney disease: Secondary | ICD-10-CM | POA: Diagnosis not present

## 2018-01-18 DIAGNOSIS — N186 End stage renal disease: Secondary | ICD-10-CM | POA: Diagnosis not present

## 2018-01-18 DIAGNOSIS — E440 Moderate protein-calorie malnutrition: Secondary | ICD-10-CM | POA: Diagnosis not present

## 2018-01-18 DIAGNOSIS — Z79899 Other long term (current) drug therapy: Secondary | ICD-10-CM | POA: Diagnosis not present

## 2018-01-18 DIAGNOSIS — D509 Iron deficiency anemia, unspecified: Secondary | ICD-10-CM | POA: Diagnosis not present

## 2018-01-18 DIAGNOSIS — N2581 Secondary hyperparathyroidism of renal origin: Secondary | ICD-10-CM | POA: Diagnosis not present

## 2018-01-19 DIAGNOSIS — Z79899 Other long term (current) drug therapy: Secondary | ICD-10-CM | POA: Diagnosis not present

## 2018-01-19 DIAGNOSIS — D509 Iron deficiency anemia, unspecified: Secondary | ICD-10-CM | POA: Diagnosis not present

## 2018-01-19 DIAGNOSIS — E440 Moderate protein-calorie malnutrition: Secondary | ICD-10-CM | POA: Diagnosis not present

## 2018-01-19 DIAGNOSIS — N2581 Secondary hyperparathyroidism of renal origin: Secondary | ICD-10-CM | POA: Diagnosis not present

## 2018-01-19 DIAGNOSIS — N186 End stage renal disease: Secondary | ICD-10-CM | POA: Diagnosis not present

## 2018-01-19 DIAGNOSIS — D631 Anemia in chronic kidney disease: Secondary | ICD-10-CM | POA: Diagnosis not present

## 2018-01-20 DIAGNOSIS — N186 End stage renal disease: Secondary | ICD-10-CM | POA: Diagnosis not present

## 2018-01-20 DIAGNOSIS — Z79899 Other long term (current) drug therapy: Secondary | ICD-10-CM | POA: Diagnosis not present

## 2018-01-20 DIAGNOSIS — D509 Iron deficiency anemia, unspecified: Secondary | ICD-10-CM | POA: Diagnosis not present

## 2018-01-20 DIAGNOSIS — E440 Moderate protein-calorie malnutrition: Secondary | ICD-10-CM | POA: Diagnosis not present

## 2018-01-20 DIAGNOSIS — D631 Anemia in chronic kidney disease: Secondary | ICD-10-CM | POA: Diagnosis not present

## 2018-01-20 DIAGNOSIS — N2581 Secondary hyperparathyroidism of renal origin: Secondary | ICD-10-CM | POA: Diagnosis not present

## 2018-01-21 DIAGNOSIS — D509 Iron deficiency anemia, unspecified: Secondary | ICD-10-CM | POA: Diagnosis not present

## 2018-01-21 DIAGNOSIS — E440 Moderate protein-calorie malnutrition: Secondary | ICD-10-CM | POA: Diagnosis not present

## 2018-01-21 DIAGNOSIS — Z79899 Other long term (current) drug therapy: Secondary | ICD-10-CM | POA: Diagnosis not present

## 2018-01-21 DIAGNOSIS — N2581 Secondary hyperparathyroidism of renal origin: Secondary | ICD-10-CM | POA: Diagnosis not present

## 2018-01-21 DIAGNOSIS — D631 Anemia in chronic kidney disease: Secondary | ICD-10-CM | POA: Diagnosis not present

## 2018-01-21 DIAGNOSIS — N186 End stage renal disease: Secondary | ICD-10-CM | POA: Diagnosis not present

## 2018-01-22 DIAGNOSIS — Z79899 Other long term (current) drug therapy: Secondary | ICD-10-CM | POA: Diagnosis not present

## 2018-01-22 DIAGNOSIS — E440 Moderate protein-calorie malnutrition: Secondary | ICD-10-CM | POA: Diagnosis not present

## 2018-01-22 DIAGNOSIS — D509 Iron deficiency anemia, unspecified: Secondary | ICD-10-CM | POA: Diagnosis not present

## 2018-01-22 DIAGNOSIS — D631 Anemia in chronic kidney disease: Secondary | ICD-10-CM | POA: Diagnosis not present

## 2018-01-22 DIAGNOSIS — N186 End stage renal disease: Secondary | ICD-10-CM | POA: Diagnosis not present

## 2018-01-22 DIAGNOSIS — N2581 Secondary hyperparathyroidism of renal origin: Secondary | ICD-10-CM | POA: Diagnosis not present

## 2018-01-23 DIAGNOSIS — D631 Anemia in chronic kidney disease: Secondary | ICD-10-CM | POA: Diagnosis not present

## 2018-01-23 DIAGNOSIS — N2581 Secondary hyperparathyroidism of renal origin: Secondary | ICD-10-CM | POA: Diagnosis not present

## 2018-01-23 DIAGNOSIS — D509 Iron deficiency anemia, unspecified: Secondary | ICD-10-CM | POA: Diagnosis not present

## 2018-01-23 DIAGNOSIS — N186 End stage renal disease: Secondary | ICD-10-CM | POA: Diagnosis not present

## 2018-01-23 DIAGNOSIS — Z79899 Other long term (current) drug therapy: Secondary | ICD-10-CM | POA: Diagnosis not present

## 2018-01-23 DIAGNOSIS — E440 Moderate protein-calorie malnutrition: Secondary | ICD-10-CM | POA: Diagnosis not present

## 2018-01-24 DIAGNOSIS — D631 Anemia in chronic kidney disease: Secondary | ICD-10-CM | POA: Diagnosis not present

## 2018-01-24 DIAGNOSIS — Z79899 Other long term (current) drug therapy: Secondary | ICD-10-CM | POA: Diagnosis not present

## 2018-01-24 DIAGNOSIS — E440 Moderate protein-calorie malnutrition: Secondary | ICD-10-CM | POA: Diagnosis not present

## 2018-01-24 DIAGNOSIS — N2581 Secondary hyperparathyroidism of renal origin: Secondary | ICD-10-CM | POA: Diagnosis not present

## 2018-01-24 DIAGNOSIS — D509 Iron deficiency anemia, unspecified: Secondary | ICD-10-CM | POA: Diagnosis not present

## 2018-01-24 DIAGNOSIS — N186 End stage renal disease: Secondary | ICD-10-CM | POA: Diagnosis not present

## 2018-01-25 DIAGNOSIS — E440 Moderate protein-calorie malnutrition: Secondary | ICD-10-CM | POA: Diagnosis not present

## 2018-01-25 DIAGNOSIS — N2581 Secondary hyperparathyroidism of renal origin: Secondary | ICD-10-CM | POA: Diagnosis not present

## 2018-01-25 DIAGNOSIS — D631 Anemia in chronic kidney disease: Secondary | ICD-10-CM | POA: Diagnosis not present

## 2018-01-25 DIAGNOSIS — D509 Iron deficiency anemia, unspecified: Secondary | ICD-10-CM | POA: Diagnosis not present

## 2018-01-25 DIAGNOSIS — N186 End stage renal disease: Secondary | ICD-10-CM | POA: Diagnosis not present

## 2018-01-25 DIAGNOSIS — Z79899 Other long term (current) drug therapy: Secondary | ICD-10-CM | POA: Diagnosis not present

## 2018-01-26 DIAGNOSIS — D509 Iron deficiency anemia, unspecified: Secondary | ICD-10-CM | POA: Diagnosis not present

## 2018-01-26 DIAGNOSIS — N186 End stage renal disease: Secondary | ICD-10-CM | POA: Diagnosis not present

## 2018-01-26 DIAGNOSIS — Z79899 Other long term (current) drug therapy: Secondary | ICD-10-CM | POA: Diagnosis not present

## 2018-01-26 DIAGNOSIS — N2581 Secondary hyperparathyroidism of renal origin: Secondary | ICD-10-CM | POA: Diagnosis not present

## 2018-01-26 DIAGNOSIS — D631 Anemia in chronic kidney disease: Secondary | ICD-10-CM | POA: Diagnosis not present

## 2018-01-26 DIAGNOSIS — E440 Moderate protein-calorie malnutrition: Secondary | ICD-10-CM | POA: Diagnosis not present

## 2018-01-27 DIAGNOSIS — E440 Moderate protein-calorie malnutrition: Secondary | ICD-10-CM | POA: Diagnosis not present

## 2018-01-27 DIAGNOSIS — D509 Iron deficiency anemia, unspecified: Secondary | ICD-10-CM | POA: Diagnosis not present

## 2018-01-27 DIAGNOSIS — Z79899 Other long term (current) drug therapy: Secondary | ICD-10-CM | POA: Diagnosis not present

## 2018-01-27 DIAGNOSIS — N186 End stage renal disease: Secondary | ICD-10-CM | POA: Diagnosis not present

## 2018-01-27 DIAGNOSIS — D631 Anemia in chronic kidney disease: Secondary | ICD-10-CM | POA: Diagnosis not present

## 2018-01-27 DIAGNOSIS — N2581 Secondary hyperparathyroidism of renal origin: Secondary | ICD-10-CM | POA: Diagnosis not present

## 2018-01-28 DIAGNOSIS — Z79899 Other long term (current) drug therapy: Secondary | ICD-10-CM | POA: Diagnosis not present

## 2018-01-28 DIAGNOSIS — N2581 Secondary hyperparathyroidism of renal origin: Secondary | ICD-10-CM | POA: Diagnosis not present

## 2018-01-28 DIAGNOSIS — N186 End stage renal disease: Secondary | ICD-10-CM | POA: Diagnosis not present

## 2018-01-28 DIAGNOSIS — E440 Moderate protein-calorie malnutrition: Secondary | ICD-10-CM | POA: Diagnosis not present

## 2018-01-28 DIAGNOSIS — D631 Anemia in chronic kidney disease: Secondary | ICD-10-CM | POA: Diagnosis not present

## 2018-01-28 DIAGNOSIS — D509 Iron deficiency anemia, unspecified: Secondary | ICD-10-CM | POA: Diagnosis not present

## 2018-01-29 DIAGNOSIS — E440 Moderate protein-calorie malnutrition: Secondary | ICD-10-CM | POA: Diagnosis not present

## 2018-01-29 DIAGNOSIS — N2581 Secondary hyperparathyroidism of renal origin: Secondary | ICD-10-CM | POA: Diagnosis not present

## 2018-01-29 DIAGNOSIS — N186 End stage renal disease: Secondary | ICD-10-CM | POA: Diagnosis not present

## 2018-01-29 DIAGNOSIS — D631 Anemia in chronic kidney disease: Secondary | ICD-10-CM | POA: Diagnosis not present

## 2018-01-29 DIAGNOSIS — D509 Iron deficiency anemia, unspecified: Secondary | ICD-10-CM | POA: Diagnosis not present

## 2018-01-29 DIAGNOSIS — Z79899 Other long term (current) drug therapy: Secondary | ICD-10-CM | POA: Diagnosis not present

## 2018-01-30 DIAGNOSIS — N186 End stage renal disease: Secondary | ICD-10-CM | POA: Diagnosis not present

## 2018-01-30 DIAGNOSIS — Z79899 Other long term (current) drug therapy: Secondary | ICD-10-CM | POA: Diagnosis not present

## 2018-01-30 DIAGNOSIS — E440 Moderate protein-calorie malnutrition: Secondary | ICD-10-CM | POA: Diagnosis not present

## 2018-01-30 DIAGNOSIS — D509 Iron deficiency anemia, unspecified: Secondary | ICD-10-CM | POA: Diagnosis not present

## 2018-01-30 DIAGNOSIS — D631 Anemia in chronic kidney disease: Secondary | ICD-10-CM | POA: Diagnosis not present

## 2018-01-30 DIAGNOSIS — N2581 Secondary hyperparathyroidism of renal origin: Secondary | ICD-10-CM | POA: Diagnosis not present

## 2018-01-31 DIAGNOSIS — E440 Moderate protein-calorie malnutrition: Secondary | ICD-10-CM | POA: Diagnosis not present

## 2018-01-31 DIAGNOSIS — N2581 Secondary hyperparathyroidism of renal origin: Secondary | ICD-10-CM | POA: Diagnosis not present

## 2018-01-31 DIAGNOSIS — N186 End stage renal disease: Secondary | ICD-10-CM | POA: Diagnosis not present

## 2018-01-31 DIAGNOSIS — D509 Iron deficiency anemia, unspecified: Secondary | ICD-10-CM | POA: Diagnosis not present

## 2018-01-31 DIAGNOSIS — Z79899 Other long term (current) drug therapy: Secondary | ICD-10-CM | POA: Diagnosis not present

## 2018-01-31 DIAGNOSIS — D631 Anemia in chronic kidney disease: Secondary | ICD-10-CM | POA: Diagnosis not present

## 2018-02-01 DIAGNOSIS — D509 Iron deficiency anemia, unspecified: Secondary | ICD-10-CM | POA: Diagnosis not present

## 2018-02-01 DIAGNOSIS — Z79899 Other long term (current) drug therapy: Secondary | ICD-10-CM | POA: Diagnosis not present

## 2018-02-01 DIAGNOSIS — N2581 Secondary hyperparathyroidism of renal origin: Secondary | ICD-10-CM | POA: Diagnosis not present

## 2018-02-01 DIAGNOSIS — N186 End stage renal disease: Secondary | ICD-10-CM | POA: Diagnosis not present

## 2018-02-01 DIAGNOSIS — E440 Moderate protein-calorie malnutrition: Secondary | ICD-10-CM | POA: Diagnosis not present

## 2018-02-01 DIAGNOSIS — D631 Anemia in chronic kidney disease: Secondary | ICD-10-CM | POA: Diagnosis not present

## 2018-02-02 DIAGNOSIS — N2581 Secondary hyperparathyroidism of renal origin: Secondary | ICD-10-CM | POA: Diagnosis not present

## 2018-02-02 DIAGNOSIS — E440 Moderate protein-calorie malnutrition: Secondary | ICD-10-CM | POA: Diagnosis not present

## 2018-02-02 DIAGNOSIS — Z79899 Other long term (current) drug therapy: Secondary | ICD-10-CM | POA: Diagnosis not present

## 2018-02-02 DIAGNOSIS — N186 End stage renal disease: Secondary | ICD-10-CM | POA: Diagnosis not present

## 2018-02-02 DIAGNOSIS — D509 Iron deficiency anemia, unspecified: Secondary | ICD-10-CM | POA: Diagnosis not present

## 2018-02-02 DIAGNOSIS — D631 Anemia in chronic kidney disease: Secondary | ICD-10-CM | POA: Diagnosis not present

## 2018-02-03 DIAGNOSIS — D509 Iron deficiency anemia, unspecified: Secondary | ICD-10-CM | POA: Diagnosis not present

## 2018-02-03 DIAGNOSIS — E440 Moderate protein-calorie malnutrition: Secondary | ICD-10-CM | POA: Diagnosis not present

## 2018-02-03 DIAGNOSIS — D631 Anemia in chronic kidney disease: Secondary | ICD-10-CM | POA: Diagnosis not present

## 2018-02-03 DIAGNOSIS — N2581 Secondary hyperparathyroidism of renal origin: Secondary | ICD-10-CM | POA: Diagnosis not present

## 2018-02-03 DIAGNOSIS — N186 End stage renal disease: Secondary | ICD-10-CM | POA: Diagnosis not present

## 2018-02-03 DIAGNOSIS — Z79899 Other long term (current) drug therapy: Secondary | ICD-10-CM | POA: Diagnosis not present

## 2018-02-04 DIAGNOSIS — Z79899 Other long term (current) drug therapy: Secondary | ICD-10-CM | POA: Diagnosis not present

## 2018-02-04 DIAGNOSIS — D631 Anemia in chronic kidney disease: Secondary | ICD-10-CM | POA: Diagnosis not present

## 2018-02-04 DIAGNOSIS — E440 Moderate protein-calorie malnutrition: Secondary | ICD-10-CM | POA: Diagnosis not present

## 2018-02-04 DIAGNOSIS — N186 End stage renal disease: Secondary | ICD-10-CM | POA: Diagnosis not present

## 2018-02-04 DIAGNOSIS — D509 Iron deficiency anemia, unspecified: Secondary | ICD-10-CM | POA: Diagnosis not present

## 2018-02-04 DIAGNOSIS — N2581 Secondary hyperparathyroidism of renal origin: Secondary | ICD-10-CM | POA: Diagnosis not present

## 2018-02-05 DIAGNOSIS — E440 Moderate protein-calorie malnutrition: Secondary | ICD-10-CM | POA: Diagnosis not present

## 2018-02-05 DIAGNOSIS — N2581 Secondary hyperparathyroidism of renal origin: Secondary | ICD-10-CM | POA: Diagnosis not present

## 2018-02-05 DIAGNOSIS — D631 Anemia in chronic kidney disease: Secondary | ICD-10-CM | POA: Diagnosis not present

## 2018-02-05 DIAGNOSIS — Z79899 Other long term (current) drug therapy: Secondary | ICD-10-CM | POA: Diagnosis not present

## 2018-02-05 DIAGNOSIS — N186 End stage renal disease: Secondary | ICD-10-CM | POA: Diagnosis not present

## 2018-02-05 DIAGNOSIS — D509 Iron deficiency anemia, unspecified: Secondary | ICD-10-CM | POA: Diagnosis not present

## 2018-02-06 DIAGNOSIS — N186 End stage renal disease: Secondary | ICD-10-CM | POA: Diagnosis not present

## 2018-02-06 DIAGNOSIS — Z79899 Other long term (current) drug therapy: Secondary | ICD-10-CM | POA: Diagnosis not present

## 2018-02-06 DIAGNOSIS — E440 Moderate protein-calorie malnutrition: Secondary | ICD-10-CM | POA: Diagnosis not present

## 2018-02-06 DIAGNOSIS — D509 Iron deficiency anemia, unspecified: Secondary | ICD-10-CM | POA: Diagnosis not present

## 2018-02-06 DIAGNOSIS — D631 Anemia in chronic kidney disease: Secondary | ICD-10-CM | POA: Diagnosis not present

## 2018-02-06 DIAGNOSIS — N2581 Secondary hyperparathyroidism of renal origin: Secondary | ICD-10-CM | POA: Diagnosis not present

## 2018-02-07 DIAGNOSIS — N2581 Secondary hyperparathyroidism of renal origin: Secondary | ICD-10-CM | POA: Diagnosis not present

## 2018-02-07 DIAGNOSIS — Z79899 Other long term (current) drug therapy: Secondary | ICD-10-CM | POA: Diagnosis not present

## 2018-02-07 DIAGNOSIS — N186 End stage renal disease: Secondary | ICD-10-CM | POA: Diagnosis not present

## 2018-02-07 DIAGNOSIS — D631 Anemia in chronic kidney disease: Secondary | ICD-10-CM | POA: Diagnosis not present

## 2018-02-07 DIAGNOSIS — E440 Moderate protein-calorie malnutrition: Secondary | ICD-10-CM | POA: Diagnosis not present

## 2018-02-07 DIAGNOSIS — D509 Iron deficiency anemia, unspecified: Secondary | ICD-10-CM | POA: Diagnosis not present

## 2018-02-08 DIAGNOSIS — D631 Anemia in chronic kidney disease: Secondary | ICD-10-CM | POA: Diagnosis not present

## 2018-02-08 DIAGNOSIS — N186 End stage renal disease: Secondary | ICD-10-CM | POA: Diagnosis not present

## 2018-02-08 DIAGNOSIS — E440 Moderate protein-calorie malnutrition: Secondary | ICD-10-CM | POA: Diagnosis not present

## 2018-02-08 DIAGNOSIS — N2581 Secondary hyperparathyroidism of renal origin: Secondary | ICD-10-CM | POA: Diagnosis not present

## 2018-02-08 DIAGNOSIS — D509 Iron deficiency anemia, unspecified: Secondary | ICD-10-CM | POA: Diagnosis not present

## 2018-02-08 DIAGNOSIS — Z79899 Other long term (current) drug therapy: Secondary | ICD-10-CM | POA: Diagnosis not present

## 2018-02-09 DIAGNOSIS — Z79899 Other long term (current) drug therapy: Secondary | ICD-10-CM | POA: Diagnosis not present

## 2018-02-09 DIAGNOSIS — D509 Iron deficiency anemia, unspecified: Secondary | ICD-10-CM | POA: Diagnosis not present

## 2018-02-09 DIAGNOSIS — N2581 Secondary hyperparathyroidism of renal origin: Secondary | ICD-10-CM | POA: Diagnosis not present

## 2018-02-09 DIAGNOSIS — E440 Moderate protein-calorie malnutrition: Secondary | ICD-10-CM | POA: Diagnosis not present

## 2018-02-09 DIAGNOSIS — N186 End stage renal disease: Secondary | ICD-10-CM | POA: Diagnosis not present

## 2018-02-09 DIAGNOSIS — D631 Anemia in chronic kidney disease: Secondary | ICD-10-CM | POA: Diagnosis not present

## 2018-02-10 DIAGNOSIS — D509 Iron deficiency anemia, unspecified: Secondary | ICD-10-CM | POA: Diagnosis not present

## 2018-02-10 DIAGNOSIS — Z79899 Other long term (current) drug therapy: Secondary | ICD-10-CM | POA: Diagnosis not present

## 2018-02-10 DIAGNOSIS — N2581 Secondary hyperparathyroidism of renal origin: Secondary | ICD-10-CM | POA: Diagnosis not present

## 2018-02-10 DIAGNOSIS — E440 Moderate protein-calorie malnutrition: Secondary | ICD-10-CM | POA: Diagnosis not present

## 2018-02-10 DIAGNOSIS — D631 Anemia in chronic kidney disease: Secondary | ICD-10-CM | POA: Diagnosis not present

## 2018-02-10 DIAGNOSIS — N186 End stage renal disease: Secondary | ICD-10-CM | POA: Diagnosis not present

## 2018-02-11 DIAGNOSIS — D509 Iron deficiency anemia, unspecified: Secondary | ICD-10-CM | POA: Diagnosis not present

## 2018-02-11 DIAGNOSIS — E440 Moderate protein-calorie malnutrition: Secondary | ICD-10-CM | POA: Diagnosis not present

## 2018-02-11 DIAGNOSIS — N186 End stage renal disease: Secondary | ICD-10-CM | POA: Diagnosis not present

## 2018-02-11 DIAGNOSIS — Z79899 Other long term (current) drug therapy: Secondary | ICD-10-CM | POA: Diagnosis not present

## 2018-02-11 DIAGNOSIS — N2581 Secondary hyperparathyroidism of renal origin: Secondary | ICD-10-CM | POA: Diagnosis not present

## 2018-02-11 DIAGNOSIS — D631 Anemia in chronic kidney disease: Secondary | ICD-10-CM | POA: Diagnosis not present

## 2018-02-11 DIAGNOSIS — R82998 Other abnormal findings in urine: Secondary | ICD-10-CM | POA: Diagnosis not present

## 2018-02-12 DIAGNOSIS — N186 End stage renal disease: Secondary | ICD-10-CM | POA: Diagnosis not present

## 2018-02-12 DIAGNOSIS — D509 Iron deficiency anemia, unspecified: Secondary | ICD-10-CM | POA: Diagnosis not present

## 2018-02-12 DIAGNOSIS — Z79899 Other long term (current) drug therapy: Secondary | ICD-10-CM | POA: Diagnosis not present

## 2018-02-12 DIAGNOSIS — D631 Anemia in chronic kidney disease: Secondary | ICD-10-CM | POA: Diagnosis not present

## 2018-02-12 DIAGNOSIS — N2581 Secondary hyperparathyroidism of renal origin: Secondary | ICD-10-CM | POA: Diagnosis not present

## 2018-02-12 DIAGNOSIS — E440 Moderate protein-calorie malnutrition: Secondary | ICD-10-CM | POA: Diagnosis not present

## 2018-02-13 DIAGNOSIS — D509 Iron deficiency anemia, unspecified: Secondary | ICD-10-CM | POA: Diagnosis not present

## 2018-02-13 DIAGNOSIS — Z79899 Other long term (current) drug therapy: Secondary | ICD-10-CM | POA: Diagnosis not present

## 2018-02-13 DIAGNOSIS — D631 Anemia in chronic kidney disease: Secondary | ICD-10-CM | POA: Diagnosis not present

## 2018-02-13 DIAGNOSIS — N2581 Secondary hyperparathyroidism of renal origin: Secondary | ICD-10-CM | POA: Diagnosis not present

## 2018-02-13 DIAGNOSIS — E440 Moderate protein-calorie malnutrition: Secondary | ICD-10-CM | POA: Diagnosis not present

## 2018-02-13 DIAGNOSIS — N186 End stage renal disease: Secondary | ICD-10-CM | POA: Diagnosis not present

## 2018-02-14 DIAGNOSIS — D509 Iron deficiency anemia, unspecified: Secondary | ICD-10-CM | POA: Diagnosis not present

## 2018-02-14 DIAGNOSIS — N2581 Secondary hyperparathyroidism of renal origin: Secondary | ICD-10-CM | POA: Diagnosis not present

## 2018-02-14 DIAGNOSIS — E440 Moderate protein-calorie malnutrition: Secondary | ICD-10-CM | POA: Diagnosis not present

## 2018-02-14 DIAGNOSIS — Z79899 Other long term (current) drug therapy: Secondary | ICD-10-CM | POA: Diagnosis not present

## 2018-02-14 DIAGNOSIS — N186 End stage renal disease: Secondary | ICD-10-CM | POA: Diagnosis not present

## 2018-02-14 DIAGNOSIS — D631 Anemia in chronic kidney disease: Secondary | ICD-10-CM | POA: Diagnosis not present

## 2018-02-15 DIAGNOSIS — N2581 Secondary hyperparathyroidism of renal origin: Secondary | ICD-10-CM | POA: Diagnosis not present

## 2018-02-15 DIAGNOSIS — N186 End stage renal disease: Secondary | ICD-10-CM | POA: Diagnosis not present

## 2018-02-15 DIAGNOSIS — Z79899 Other long term (current) drug therapy: Secondary | ICD-10-CM | POA: Diagnosis not present

## 2018-02-15 DIAGNOSIS — E440 Moderate protein-calorie malnutrition: Secondary | ICD-10-CM | POA: Diagnosis not present

## 2018-02-15 DIAGNOSIS — D631 Anemia in chronic kidney disease: Secondary | ICD-10-CM | POA: Diagnosis not present

## 2018-02-15 DIAGNOSIS — D509 Iron deficiency anemia, unspecified: Secondary | ICD-10-CM | POA: Diagnosis not present

## 2018-02-16 DIAGNOSIS — N2581 Secondary hyperparathyroidism of renal origin: Secondary | ICD-10-CM | POA: Diagnosis not present

## 2018-02-16 DIAGNOSIS — E440 Moderate protein-calorie malnutrition: Secondary | ICD-10-CM | POA: Diagnosis not present

## 2018-02-16 DIAGNOSIS — Z79899 Other long term (current) drug therapy: Secondary | ICD-10-CM | POA: Diagnosis not present

## 2018-02-16 DIAGNOSIS — N186 End stage renal disease: Secondary | ICD-10-CM | POA: Diagnosis not present

## 2018-02-16 DIAGNOSIS — D631 Anemia in chronic kidney disease: Secondary | ICD-10-CM | POA: Diagnosis not present

## 2018-02-16 DIAGNOSIS — D509 Iron deficiency anemia, unspecified: Secondary | ICD-10-CM | POA: Diagnosis not present

## 2018-02-17 DIAGNOSIS — E440 Moderate protein-calorie malnutrition: Secondary | ICD-10-CM | POA: Diagnosis not present

## 2018-02-17 DIAGNOSIS — N186 End stage renal disease: Secondary | ICD-10-CM | POA: Diagnosis not present

## 2018-02-17 DIAGNOSIS — D509 Iron deficiency anemia, unspecified: Secondary | ICD-10-CM | POA: Diagnosis not present

## 2018-02-17 DIAGNOSIS — N2581 Secondary hyperparathyroidism of renal origin: Secondary | ICD-10-CM | POA: Diagnosis not present

## 2018-02-17 DIAGNOSIS — D631 Anemia in chronic kidney disease: Secondary | ICD-10-CM | POA: Diagnosis not present

## 2018-02-17 DIAGNOSIS — Z79899 Other long term (current) drug therapy: Secondary | ICD-10-CM | POA: Diagnosis not present

## 2018-02-18 DIAGNOSIS — E440 Moderate protein-calorie malnutrition: Secondary | ICD-10-CM | POA: Diagnosis not present

## 2018-02-18 DIAGNOSIS — D631 Anemia in chronic kidney disease: Secondary | ICD-10-CM | POA: Diagnosis not present

## 2018-02-18 DIAGNOSIS — Z79899 Other long term (current) drug therapy: Secondary | ICD-10-CM | POA: Diagnosis not present

## 2018-02-18 DIAGNOSIS — N2581 Secondary hyperparathyroidism of renal origin: Secondary | ICD-10-CM | POA: Diagnosis not present

## 2018-02-18 DIAGNOSIS — N186 End stage renal disease: Secondary | ICD-10-CM | POA: Diagnosis not present

## 2018-02-18 DIAGNOSIS — D509 Iron deficiency anemia, unspecified: Secondary | ICD-10-CM | POA: Diagnosis not present

## 2018-02-19 DIAGNOSIS — Z79899 Other long term (current) drug therapy: Secondary | ICD-10-CM | POA: Diagnosis not present

## 2018-02-19 DIAGNOSIS — N2581 Secondary hyperparathyroidism of renal origin: Secondary | ICD-10-CM | POA: Diagnosis not present

## 2018-02-19 DIAGNOSIS — D509 Iron deficiency anemia, unspecified: Secondary | ICD-10-CM | POA: Diagnosis not present

## 2018-02-19 DIAGNOSIS — E440 Moderate protein-calorie malnutrition: Secondary | ICD-10-CM | POA: Diagnosis not present

## 2018-02-19 DIAGNOSIS — D631 Anemia in chronic kidney disease: Secondary | ICD-10-CM | POA: Diagnosis not present

## 2018-02-19 DIAGNOSIS — N186 End stage renal disease: Secondary | ICD-10-CM | POA: Diagnosis not present

## 2018-02-20 DIAGNOSIS — N186 End stage renal disease: Secondary | ICD-10-CM | POA: Diagnosis not present

## 2018-02-20 DIAGNOSIS — D631 Anemia in chronic kidney disease: Secondary | ICD-10-CM | POA: Diagnosis not present

## 2018-02-20 DIAGNOSIS — N189 Chronic kidney disease, unspecified: Secondary | ICD-10-CM | POA: Diagnosis not present

## 2018-02-20 DIAGNOSIS — D509 Iron deficiency anemia, unspecified: Secondary | ICD-10-CM | POA: Diagnosis not present

## 2018-02-20 DIAGNOSIS — N2581 Secondary hyperparathyroidism of renal origin: Secondary | ICD-10-CM | POA: Diagnosis not present

## 2018-02-20 DIAGNOSIS — Z79899 Other long term (current) drug therapy: Secondary | ICD-10-CM | POA: Diagnosis not present

## 2018-02-20 DIAGNOSIS — E440 Moderate protein-calorie malnutrition: Secondary | ICD-10-CM | POA: Diagnosis not present

## 2018-02-20 DIAGNOSIS — Z992 Dependence on renal dialysis: Secondary | ICD-10-CM | POA: Diagnosis not present

## 2018-02-20 DIAGNOSIS — I120 Hypertensive chronic kidney disease with stage 5 chronic kidney disease or end stage renal disease: Secondary | ICD-10-CM | POA: Diagnosis not present

## 2018-02-21 DIAGNOSIS — N186 End stage renal disease: Secondary | ICD-10-CM | POA: Diagnosis not present

## 2018-02-21 DIAGNOSIS — N2581 Secondary hyperparathyroidism of renal origin: Secondary | ICD-10-CM | POA: Diagnosis not present

## 2018-02-21 DIAGNOSIS — E440 Moderate protein-calorie malnutrition: Secondary | ICD-10-CM | POA: Diagnosis not present

## 2018-02-21 DIAGNOSIS — Z79899 Other long term (current) drug therapy: Secondary | ICD-10-CM | POA: Diagnosis not present

## 2018-02-21 DIAGNOSIS — D509 Iron deficiency anemia, unspecified: Secondary | ICD-10-CM | POA: Diagnosis not present

## 2018-02-21 DIAGNOSIS — D631 Anemia in chronic kidney disease: Secondary | ICD-10-CM | POA: Diagnosis not present

## 2018-02-22 DIAGNOSIS — N2581 Secondary hyperparathyroidism of renal origin: Secondary | ICD-10-CM | POA: Diagnosis not present

## 2018-02-22 DIAGNOSIS — D509 Iron deficiency anemia, unspecified: Secondary | ICD-10-CM | POA: Diagnosis not present

## 2018-02-22 DIAGNOSIS — D631 Anemia in chronic kidney disease: Secondary | ICD-10-CM | POA: Diagnosis not present

## 2018-02-22 DIAGNOSIS — Z79899 Other long term (current) drug therapy: Secondary | ICD-10-CM | POA: Diagnosis not present

## 2018-02-22 DIAGNOSIS — E440 Moderate protein-calorie malnutrition: Secondary | ICD-10-CM | POA: Diagnosis not present

## 2018-02-22 DIAGNOSIS — N186 End stage renal disease: Secondary | ICD-10-CM | POA: Diagnosis not present

## 2018-02-23 DIAGNOSIS — N2581 Secondary hyperparathyroidism of renal origin: Secondary | ICD-10-CM | POA: Diagnosis not present

## 2018-02-23 DIAGNOSIS — D509 Iron deficiency anemia, unspecified: Secondary | ICD-10-CM | POA: Diagnosis not present

## 2018-02-23 DIAGNOSIS — D631 Anemia in chronic kidney disease: Secondary | ICD-10-CM | POA: Diagnosis not present

## 2018-02-23 DIAGNOSIS — E440 Moderate protein-calorie malnutrition: Secondary | ICD-10-CM | POA: Diagnosis not present

## 2018-02-23 DIAGNOSIS — Z79899 Other long term (current) drug therapy: Secondary | ICD-10-CM | POA: Diagnosis not present

## 2018-02-23 DIAGNOSIS — N186 End stage renal disease: Secondary | ICD-10-CM | POA: Diagnosis not present

## 2018-02-24 DIAGNOSIS — N186 End stage renal disease: Secondary | ICD-10-CM | POA: Diagnosis not present

## 2018-02-24 DIAGNOSIS — D509 Iron deficiency anemia, unspecified: Secondary | ICD-10-CM | POA: Diagnosis not present

## 2018-02-24 DIAGNOSIS — E440 Moderate protein-calorie malnutrition: Secondary | ICD-10-CM | POA: Diagnosis not present

## 2018-02-24 DIAGNOSIS — Z79899 Other long term (current) drug therapy: Secondary | ICD-10-CM | POA: Diagnosis not present

## 2018-02-24 DIAGNOSIS — N2581 Secondary hyperparathyroidism of renal origin: Secondary | ICD-10-CM | POA: Diagnosis not present

## 2018-02-24 DIAGNOSIS — D631 Anemia in chronic kidney disease: Secondary | ICD-10-CM | POA: Diagnosis not present

## 2018-02-25 DIAGNOSIS — D631 Anemia in chronic kidney disease: Secondary | ICD-10-CM | POA: Diagnosis not present

## 2018-02-25 DIAGNOSIS — D509 Iron deficiency anemia, unspecified: Secondary | ICD-10-CM | POA: Diagnosis not present

## 2018-02-25 DIAGNOSIS — N186 End stage renal disease: Secondary | ICD-10-CM | POA: Diagnosis not present

## 2018-02-25 DIAGNOSIS — Z79899 Other long term (current) drug therapy: Secondary | ICD-10-CM | POA: Diagnosis not present

## 2018-02-25 DIAGNOSIS — N2581 Secondary hyperparathyroidism of renal origin: Secondary | ICD-10-CM | POA: Diagnosis not present

## 2018-02-25 DIAGNOSIS — E440 Moderate protein-calorie malnutrition: Secondary | ICD-10-CM | POA: Diagnosis not present

## 2018-02-26 DIAGNOSIS — D631 Anemia in chronic kidney disease: Secondary | ICD-10-CM | POA: Diagnosis not present

## 2018-02-26 DIAGNOSIS — N186 End stage renal disease: Secondary | ICD-10-CM | POA: Diagnosis not present

## 2018-02-26 DIAGNOSIS — D509 Iron deficiency anemia, unspecified: Secondary | ICD-10-CM | POA: Diagnosis not present

## 2018-02-26 DIAGNOSIS — E440 Moderate protein-calorie malnutrition: Secondary | ICD-10-CM | POA: Diagnosis not present

## 2018-02-26 DIAGNOSIS — N2581 Secondary hyperparathyroidism of renal origin: Secondary | ICD-10-CM | POA: Diagnosis not present

## 2018-02-26 DIAGNOSIS — Z79899 Other long term (current) drug therapy: Secondary | ICD-10-CM | POA: Diagnosis not present

## 2018-02-27 DIAGNOSIS — N186 End stage renal disease: Secondary | ICD-10-CM | POA: Diagnosis not present

## 2018-02-27 DIAGNOSIS — B078 Other viral warts: Secondary | ICD-10-CM | POA: Diagnosis not present

## 2018-02-27 DIAGNOSIS — N2581 Secondary hyperparathyroidism of renal origin: Secondary | ICD-10-CM | POA: Diagnosis not present

## 2018-02-27 DIAGNOSIS — E440 Moderate protein-calorie malnutrition: Secondary | ICD-10-CM | POA: Diagnosis not present

## 2018-02-27 DIAGNOSIS — D631 Anemia in chronic kidney disease: Secondary | ICD-10-CM | POA: Diagnosis not present

## 2018-02-27 DIAGNOSIS — Z79899 Other long term (current) drug therapy: Secondary | ICD-10-CM | POA: Diagnosis not present

## 2018-02-27 DIAGNOSIS — D509 Iron deficiency anemia, unspecified: Secondary | ICD-10-CM | POA: Diagnosis not present

## 2018-02-28 DIAGNOSIS — D509 Iron deficiency anemia, unspecified: Secondary | ICD-10-CM | POA: Diagnosis not present

## 2018-02-28 DIAGNOSIS — N2581 Secondary hyperparathyroidism of renal origin: Secondary | ICD-10-CM | POA: Diagnosis not present

## 2018-02-28 DIAGNOSIS — N186 End stage renal disease: Secondary | ICD-10-CM | POA: Diagnosis not present

## 2018-02-28 DIAGNOSIS — E440 Moderate protein-calorie malnutrition: Secondary | ICD-10-CM | POA: Diagnosis not present

## 2018-02-28 DIAGNOSIS — Z79899 Other long term (current) drug therapy: Secondary | ICD-10-CM | POA: Diagnosis not present

## 2018-02-28 DIAGNOSIS — D631 Anemia in chronic kidney disease: Secondary | ICD-10-CM | POA: Diagnosis not present

## 2018-03-01 DIAGNOSIS — N2581 Secondary hyperparathyroidism of renal origin: Secondary | ICD-10-CM | POA: Diagnosis not present

## 2018-03-01 DIAGNOSIS — N186 End stage renal disease: Secondary | ICD-10-CM | POA: Diagnosis not present

## 2018-03-01 DIAGNOSIS — D631 Anemia in chronic kidney disease: Secondary | ICD-10-CM | POA: Diagnosis not present

## 2018-03-01 DIAGNOSIS — E440 Moderate protein-calorie malnutrition: Secondary | ICD-10-CM | POA: Diagnosis not present

## 2018-03-01 DIAGNOSIS — D509 Iron deficiency anemia, unspecified: Secondary | ICD-10-CM | POA: Diagnosis not present

## 2018-03-01 DIAGNOSIS — Z79899 Other long term (current) drug therapy: Secondary | ICD-10-CM | POA: Diagnosis not present

## 2018-03-02 DIAGNOSIS — D509 Iron deficiency anemia, unspecified: Secondary | ICD-10-CM | POA: Diagnosis not present

## 2018-03-02 DIAGNOSIS — D631 Anemia in chronic kidney disease: Secondary | ICD-10-CM | POA: Diagnosis not present

## 2018-03-02 DIAGNOSIS — Z79899 Other long term (current) drug therapy: Secondary | ICD-10-CM | POA: Diagnosis not present

## 2018-03-02 DIAGNOSIS — N186 End stage renal disease: Secondary | ICD-10-CM | POA: Diagnosis not present

## 2018-03-02 DIAGNOSIS — N2581 Secondary hyperparathyroidism of renal origin: Secondary | ICD-10-CM | POA: Diagnosis not present

## 2018-03-02 DIAGNOSIS — E440 Moderate protein-calorie malnutrition: Secondary | ICD-10-CM | POA: Diagnosis not present

## 2018-03-03 DIAGNOSIS — E440 Moderate protein-calorie malnutrition: Secondary | ICD-10-CM | POA: Diagnosis not present

## 2018-03-03 DIAGNOSIS — N186 End stage renal disease: Secondary | ICD-10-CM | POA: Diagnosis not present

## 2018-03-03 DIAGNOSIS — Z992 Dependence on renal dialysis: Secondary | ICD-10-CM | POA: Diagnosis not present

## 2018-03-03 DIAGNOSIS — Z79899 Other long term (current) drug therapy: Secondary | ICD-10-CM | POA: Diagnosis not present

## 2018-03-03 DIAGNOSIS — D631 Anemia in chronic kidney disease: Secondary | ICD-10-CM | POA: Diagnosis not present

## 2018-03-03 DIAGNOSIS — N2889 Other specified disorders of kidney and ureter: Secondary | ICD-10-CM | POA: Diagnosis not present

## 2018-03-03 DIAGNOSIS — N2581 Secondary hyperparathyroidism of renal origin: Secondary | ICD-10-CM | POA: Diagnosis not present

## 2018-03-03 DIAGNOSIS — D509 Iron deficiency anemia, unspecified: Secondary | ICD-10-CM | POA: Diagnosis not present

## 2018-03-04 DIAGNOSIS — N186 End stage renal disease: Secondary | ICD-10-CM | POA: Diagnosis not present

## 2018-03-04 DIAGNOSIS — N2581 Secondary hyperparathyroidism of renal origin: Secondary | ICD-10-CM | POA: Diagnosis not present

## 2018-03-04 DIAGNOSIS — Z4932 Encounter for adequacy testing for peritoneal dialysis: Secondary | ICD-10-CM | POA: Diagnosis not present

## 2018-03-04 DIAGNOSIS — N2589 Other disorders resulting from impaired renal tubular function: Secondary | ICD-10-CM | POA: Diagnosis not present

## 2018-03-04 DIAGNOSIS — E7849 Other hyperlipidemia: Secondary | ICD-10-CM | POA: Diagnosis not present

## 2018-03-04 DIAGNOSIS — R82998 Other abnormal findings in urine: Secondary | ICD-10-CM | POA: Diagnosis not present

## 2018-03-04 DIAGNOSIS — D631 Anemia in chronic kidney disease: Secondary | ICD-10-CM | POA: Diagnosis not present

## 2018-03-04 DIAGNOSIS — D509 Iron deficiency anemia, unspecified: Secondary | ICD-10-CM | POA: Diagnosis not present

## 2018-03-05 DIAGNOSIS — D509 Iron deficiency anemia, unspecified: Secondary | ICD-10-CM | POA: Diagnosis not present

## 2018-03-05 DIAGNOSIS — D631 Anemia in chronic kidney disease: Secondary | ICD-10-CM | POA: Diagnosis not present

## 2018-03-05 DIAGNOSIS — Z4932 Encounter for adequacy testing for peritoneal dialysis: Secondary | ICD-10-CM | POA: Diagnosis not present

## 2018-03-05 DIAGNOSIS — N2589 Other disorders resulting from impaired renal tubular function: Secondary | ICD-10-CM | POA: Diagnosis not present

## 2018-03-05 DIAGNOSIS — N2581 Secondary hyperparathyroidism of renal origin: Secondary | ICD-10-CM | POA: Diagnosis not present

## 2018-03-05 DIAGNOSIS — N186 End stage renal disease: Secondary | ICD-10-CM | POA: Diagnosis not present

## 2018-03-06 DIAGNOSIS — D631 Anemia in chronic kidney disease: Secondary | ICD-10-CM | POA: Diagnosis not present

## 2018-03-06 DIAGNOSIS — N2589 Other disorders resulting from impaired renal tubular function: Secondary | ICD-10-CM | POA: Diagnosis not present

## 2018-03-06 DIAGNOSIS — D509 Iron deficiency anemia, unspecified: Secondary | ICD-10-CM | POA: Diagnosis not present

## 2018-03-06 DIAGNOSIS — Z4932 Encounter for adequacy testing for peritoneal dialysis: Secondary | ICD-10-CM | POA: Diagnosis not present

## 2018-03-06 DIAGNOSIS — N186 End stage renal disease: Secondary | ICD-10-CM | POA: Diagnosis not present

## 2018-03-06 DIAGNOSIS — N2581 Secondary hyperparathyroidism of renal origin: Secondary | ICD-10-CM | POA: Diagnosis not present

## 2018-03-07 DIAGNOSIS — N186 End stage renal disease: Secondary | ICD-10-CM | POA: Diagnosis not present

## 2018-03-07 DIAGNOSIS — D631 Anemia in chronic kidney disease: Secondary | ICD-10-CM | POA: Diagnosis not present

## 2018-03-07 DIAGNOSIS — Z4932 Encounter for adequacy testing for peritoneal dialysis: Secondary | ICD-10-CM | POA: Diagnosis not present

## 2018-03-07 DIAGNOSIS — N2589 Other disorders resulting from impaired renal tubular function: Secondary | ICD-10-CM | POA: Diagnosis not present

## 2018-03-07 DIAGNOSIS — D509 Iron deficiency anemia, unspecified: Secondary | ICD-10-CM | POA: Diagnosis not present

## 2018-03-07 DIAGNOSIS — N2581 Secondary hyperparathyroidism of renal origin: Secondary | ICD-10-CM | POA: Diagnosis not present

## 2018-03-08 DIAGNOSIS — D631 Anemia in chronic kidney disease: Secondary | ICD-10-CM | POA: Diagnosis not present

## 2018-03-08 DIAGNOSIS — Z4932 Encounter for adequacy testing for peritoneal dialysis: Secondary | ICD-10-CM | POA: Diagnosis not present

## 2018-03-08 DIAGNOSIS — N186 End stage renal disease: Secondary | ICD-10-CM | POA: Diagnosis not present

## 2018-03-08 DIAGNOSIS — D509 Iron deficiency anemia, unspecified: Secondary | ICD-10-CM | POA: Diagnosis not present

## 2018-03-08 DIAGNOSIS — N2589 Other disorders resulting from impaired renal tubular function: Secondary | ICD-10-CM | POA: Diagnosis not present

## 2018-03-08 DIAGNOSIS — N2581 Secondary hyperparathyroidism of renal origin: Secondary | ICD-10-CM | POA: Diagnosis not present

## 2018-03-09 DIAGNOSIS — D631 Anemia in chronic kidney disease: Secondary | ICD-10-CM | POA: Diagnosis not present

## 2018-03-09 DIAGNOSIS — Z4932 Encounter for adequacy testing for peritoneal dialysis: Secondary | ICD-10-CM | POA: Diagnosis not present

## 2018-03-09 DIAGNOSIS — N2581 Secondary hyperparathyroidism of renal origin: Secondary | ICD-10-CM | POA: Diagnosis not present

## 2018-03-09 DIAGNOSIS — N186 End stage renal disease: Secondary | ICD-10-CM | POA: Diagnosis not present

## 2018-03-09 DIAGNOSIS — N2589 Other disorders resulting from impaired renal tubular function: Secondary | ICD-10-CM | POA: Diagnosis not present

## 2018-03-09 DIAGNOSIS — D509 Iron deficiency anemia, unspecified: Secondary | ICD-10-CM | POA: Diagnosis not present

## 2018-03-10 DIAGNOSIS — N2581 Secondary hyperparathyroidism of renal origin: Secondary | ICD-10-CM | POA: Diagnosis not present

## 2018-03-10 DIAGNOSIS — N186 End stage renal disease: Secondary | ICD-10-CM | POA: Diagnosis not present

## 2018-03-10 DIAGNOSIS — Z4932 Encounter for adequacy testing for peritoneal dialysis: Secondary | ICD-10-CM | POA: Diagnosis not present

## 2018-03-10 DIAGNOSIS — D509 Iron deficiency anemia, unspecified: Secondary | ICD-10-CM | POA: Diagnosis not present

## 2018-03-10 DIAGNOSIS — D631 Anemia in chronic kidney disease: Secondary | ICD-10-CM | POA: Diagnosis not present

## 2018-03-10 DIAGNOSIS — N2589 Other disorders resulting from impaired renal tubular function: Secondary | ICD-10-CM | POA: Diagnosis not present

## 2018-03-11 DIAGNOSIS — N2589 Other disorders resulting from impaired renal tubular function: Secondary | ICD-10-CM | POA: Diagnosis not present

## 2018-03-11 DIAGNOSIS — Z4932 Encounter for adequacy testing for peritoneal dialysis: Secondary | ICD-10-CM | POA: Diagnosis not present

## 2018-03-11 DIAGNOSIS — N2581 Secondary hyperparathyroidism of renal origin: Secondary | ICD-10-CM | POA: Diagnosis not present

## 2018-03-11 DIAGNOSIS — D509 Iron deficiency anemia, unspecified: Secondary | ICD-10-CM | POA: Diagnosis not present

## 2018-03-11 DIAGNOSIS — N186 End stage renal disease: Secondary | ICD-10-CM | POA: Diagnosis not present

## 2018-03-11 DIAGNOSIS — D631 Anemia in chronic kidney disease: Secondary | ICD-10-CM | POA: Diagnosis not present

## 2018-03-12 DIAGNOSIS — N186 End stage renal disease: Secondary | ICD-10-CM | POA: Diagnosis not present

## 2018-03-12 DIAGNOSIS — D631 Anemia in chronic kidney disease: Secondary | ICD-10-CM | POA: Diagnosis not present

## 2018-03-12 DIAGNOSIS — N2581 Secondary hyperparathyroidism of renal origin: Secondary | ICD-10-CM | POA: Diagnosis not present

## 2018-03-12 DIAGNOSIS — N2589 Other disorders resulting from impaired renal tubular function: Secondary | ICD-10-CM | POA: Diagnosis not present

## 2018-03-12 DIAGNOSIS — Z4932 Encounter for adequacy testing for peritoneal dialysis: Secondary | ICD-10-CM | POA: Diagnosis not present

## 2018-03-12 DIAGNOSIS — D509 Iron deficiency anemia, unspecified: Secondary | ICD-10-CM | POA: Diagnosis not present

## 2018-03-13 DIAGNOSIS — D631 Anemia in chronic kidney disease: Secondary | ICD-10-CM | POA: Diagnosis not present

## 2018-03-13 DIAGNOSIS — N2589 Other disorders resulting from impaired renal tubular function: Secondary | ICD-10-CM | POA: Diagnosis not present

## 2018-03-13 DIAGNOSIS — Z4932 Encounter for adequacy testing for peritoneal dialysis: Secondary | ICD-10-CM | POA: Diagnosis not present

## 2018-03-13 DIAGNOSIS — N2581 Secondary hyperparathyroidism of renal origin: Secondary | ICD-10-CM | POA: Diagnosis not present

## 2018-03-13 DIAGNOSIS — D509 Iron deficiency anemia, unspecified: Secondary | ICD-10-CM | POA: Diagnosis not present

## 2018-03-13 DIAGNOSIS — N186 End stage renal disease: Secondary | ICD-10-CM | POA: Diagnosis not present

## 2018-03-14 DIAGNOSIS — Z4932 Encounter for adequacy testing for peritoneal dialysis: Secondary | ICD-10-CM | POA: Diagnosis not present

## 2018-03-14 DIAGNOSIS — N2589 Other disorders resulting from impaired renal tubular function: Secondary | ICD-10-CM | POA: Diagnosis not present

## 2018-03-14 DIAGNOSIS — N2581 Secondary hyperparathyroidism of renal origin: Secondary | ICD-10-CM | POA: Diagnosis not present

## 2018-03-14 DIAGNOSIS — D509 Iron deficiency anemia, unspecified: Secondary | ICD-10-CM | POA: Diagnosis not present

## 2018-03-14 DIAGNOSIS — D631 Anemia in chronic kidney disease: Secondary | ICD-10-CM | POA: Diagnosis not present

## 2018-03-14 DIAGNOSIS — N186 End stage renal disease: Secondary | ICD-10-CM | POA: Diagnosis not present

## 2018-03-15 DIAGNOSIS — N186 End stage renal disease: Secondary | ICD-10-CM | POA: Diagnosis not present

## 2018-03-15 DIAGNOSIS — D631 Anemia in chronic kidney disease: Secondary | ICD-10-CM | POA: Diagnosis not present

## 2018-03-15 DIAGNOSIS — D509 Iron deficiency anemia, unspecified: Secondary | ICD-10-CM | POA: Diagnosis not present

## 2018-03-15 DIAGNOSIS — N2581 Secondary hyperparathyroidism of renal origin: Secondary | ICD-10-CM | POA: Diagnosis not present

## 2018-03-15 DIAGNOSIS — N2589 Other disorders resulting from impaired renal tubular function: Secondary | ICD-10-CM | POA: Diagnosis not present

## 2018-03-15 DIAGNOSIS — Z4932 Encounter for adequacy testing for peritoneal dialysis: Secondary | ICD-10-CM | POA: Diagnosis not present

## 2018-03-16 DIAGNOSIS — N2581 Secondary hyperparathyroidism of renal origin: Secondary | ICD-10-CM | POA: Diagnosis not present

## 2018-03-16 DIAGNOSIS — D631 Anemia in chronic kidney disease: Secondary | ICD-10-CM | POA: Diagnosis not present

## 2018-03-16 DIAGNOSIS — D509 Iron deficiency anemia, unspecified: Secondary | ICD-10-CM | POA: Diagnosis not present

## 2018-03-16 DIAGNOSIS — N2589 Other disorders resulting from impaired renal tubular function: Secondary | ICD-10-CM | POA: Diagnosis not present

## 2018-03-16 DIAGNOSIS — N186 End stage renal disease: Secondary | ICD-10-CM | POA: Diagnosis not present

## 2018-03-16 DIAGNOSIS — Z4932 Encounter for adequacy testing for peritoneal dialysis: Secondary | ICD-10-CM | POA: Diagnosis not present

## 2018-03-17 DIAGNOSIS — Z4932 Encounter for adequacy testing for peritoneal dialysis: Secondary | ICD-10-CM | POA: Diagnosis not present

## 2018-03-17 DIAGNOSIS — D509 Iron deficiency anemia, unspecified: Secondary | ICD-10-CM | POA: Diagnosis not present

## 2018-03-17 DIAGNOSIS — N2589 Other disorders resulting from impaired renal tubular function: Secondary | ICD-10-CM | POA: Diagnosis not present

## 2018-03-17 DIAGNOSIS — N2581 Secondary hyperparathyroidism of renal origin: Secondary | ICD-10-CM | POA: Diagnosis not present

## 2018-03-17 DIAGNOSIS — N186 End stage renal disease: Secondary | ICD-10-CM | POA: Diagnosis not present

## 2018-03-17 DIAGNOSIS — D631 Anemia in chronic kidney disease: Secondary | ICD-10-CM | POA: Diagnosis not present

## 2018-03-18 DIAGNOSIS — N2589 Other disorders resulting from impaired renal tubular function: Secondary | ICD-10-CM | POA: Diagnosis not present

## 2018-03-18 DIAGNOSIS — N2581 Secondary hyperparathyroidism of renal origin: Secondary | ICD-10-CM | POA: Diagnosis not present

## 2018-03-18 DIAGNOSIS — Z4932 Encounter for adequacy testing for peritoneal dialysis: Secondary | ICD-10-CM | POA: Diagnosis not present

## 2018-03-18 DIAGNOSIS — D631 Anemia in chronic kidney disease: Secondary | ICD-10-CM | POA: Diagnosis not present

## 2018-03-18 DIAGNOSIS — D509 Iron deficiency anemia, unspecified: Secondary | ICD-10-CM | POA: Diagnosis not present

## 2018-03-18 DIAGNOSIS — N186 End stage renal disease: Secondary | ICD-10-CM | POA: Diagnosis not present

## 2018-03-19 DIAGNOSIS — D509 Iron deficiency anemia, unspecified: Secondary | ICD-10-CM | POA: Diagnosis not present

## 2018-03-19 DIAGNOSIS — D631 Anemia in chronic kidney disease: Secondary | ICD-10-CM | POA: Diagnosis not present

## 2018-03-19 DIAGNOSIS — N2581 Secondary hyperparathyroidism of renal origin: Secondary | ICD-10-CM | POA: Diagnosis not present

## 2018-03-19 DIAGNOSIS — N2589 Other disorders resulting from impaired renal tubular function: Secondary | ICD-10-CM | POA: Diagnosis not present

## 2018-03-19 DIAGNOSIS — Z4932 Encounter for adequacy testing for peritoneal dialysis: Secondary | ICD-10-CM | POA: Diagnosis not present

## 2018-03-19 DIAGNOSIS — N186 End stage renal disease: Secondary | ICD-10-CM | POA: Diagnosis not present

## 2018-03-20 DIAGNOSIS — N2589 Other disorders resulting from impaired renal tubular function: Secondary | ICD-10-CM | POA: Diagnosis not present

## 2018-03-20 DIAGNOSIS — Z4932 Encounter for adequacy testing for peritoneal dialysis: Secondary | ICD-10-CM | POA: Diagnosis not present

## 2018-03-20 DIAGNOSIS — D509 Iron deficiency anemia, unspecified: Secondary | ICD-10-CM | POA: Diagnosis not present

## 2018-03-20 DIAGNOSIS — N186 End stage renal disease: Secondary | ICD-10-CM | POA: Diagnosis not present

## 2018-03-20 DIAGNOSIS — D631 Anemia in chronic kidney disease: Secondary | ICD-10-CM | POA: Diagnosis not present

## 2018-03-20 DIAGNOSIS — N2581 Secondary hyperparathyroidism of renal origin: Secondary | ICD-10-CM | POA: Diagnosis not present

## 2018-03-21 DIAGNOSIS — N186 End stage renal disease: Secondary | ICD-10-CM | POA: Diagnosis not present

## 2018-03-21 DIAGNOSIS — D631 Anemia in chronic kidney disease: Secondary | ICD-10-CM | POA: Diagnosis not present

## 2018-03-21 DIAGNOSIS — D509 Iron deficiency anemia, unspecified: Secondary | ICD-10-CM | POA: Diagnosis not present

## 2018-03-21 DIAGNOSIS — Z4932 Encounter for adequacy testing for peritoneal dialysis: Secondary | ICD-10-CM | POA: Diagnosis not present

## 2018-03-21 DIAGNOSIS — N2581 Secondary hyperparathyroidism of renal origin: Secondary | ICD-10-CM | POA: Diagnosis not present

## 2018-03-21 DIAGNOSIS — N2589 Other disorders resulting from impaired renal tubular function: Secondary | ICD-10-CM | POA: Diagnosis not present

## 2018-03-22 DIAGNOSIS — N186 End stage renal disease: Secondary | ICD-10-CM | POA: Diagnosis not present

## 2018-03-22 DIAGNOSIS — Z4932 Encounter for adequacy testing for peritoneal dialysis: Secondary | ICD-10-CM | POA: Diagnosis not present

## 2018-03-22 DIAGNOSIS — D509 Iron deficiency anemia, unspecified: Secondary | ICD-10-CM | POA: Diagnosis not present

## 2018-03-22 DIAGNOSIS — D631 Anemia in chronic kidney disease: Secondary | ICD-10-CM | POA: Diagnosis not present

## 2018-03-22 DIAGNOSIS — N2581 Secondary hyperparathyroidism of renal origin: Secondary | ICD-10-CM | POA: Diagnosis not present

## 2018-03-22 DIAGNOSIS — N2589 Other disorders resulting from impaired renal tubular function: Secondary | ICD-10-CM | POA: Diagnosis not present

## 2018-03-23 DIAGNOSIS — N2581 Secondary hyperparathyroidism of renal origin: Secondary | ICD-10-CM | POA: Diagnosis not present

## 2018-03-23 DIAGNOSIS — D509 Iron deficiency anemia, unspecified: Secondary | ICD-10-CM | POA: Diagnosis not present

## 2018-03-23 DIAGNOSIS — D631 Anemia in chronic kidney disease: Secondary | ICD-10-CM | POA: Diagnosis not present

## 2018-03-23 DIAGNOSIS — N2589 Other disorders resulting from impaired renal tubular function: Secondary | ICD-10-CM | POA: Diagnosis not present

## 2018-03-23 DIAGNOSIS — Z4932 Encounter for adequacy testing for peritoneal dialysis: Secondary | ICD-10-CM | POA: Diagnosis not present

## 2018-03-23 DIAGNOSIS — N186 End stage renal disease: Secondary | ICD-10-CM | POA: Diagnosis not present

## 2018-03-24 DIAGNOSIS — N2589 Other disorders resulting from impaired renal tubular function: Secondary | ICD-10-CM | POA: Diagnosis not present

## 2018-03-24 DIAGNOSIS — N2581 Secondary hyperparathyroidism of renal origin: Secondary | ICD-10-CM | POA: Diagnosis not present

## 2018-03-24 DIAGNOSIS — N186 End stage renal disease: Secondary | ICD-10-CM | POA: Diagnosis not present

## 2018-03-24 DIAGNOSIS — D509 Iron deficiency anemia, unspecified: Secondary | ICD-10-CM | POA: Diagnosis not present

## 2018-03-24 DIAGNOSIS — D631 Anemia in chronic kidney disease: Secondary | ICD-10-CM | POA: Diagnosis not present

## 2018-03-24 DIAGNOSIS — Z4932 Encounter for adequacy testing for peritoneal dialysis: Secondary | ICD-10-CM | POA: Diagnosis not present

## 2018-03-25 DIAGNOSIS — D509 Iron deficiency anemia, unspecified: Secondary | ICD-10-CM | POA: Diagnosis not present

## 2018-03-25 DIAGNOSIS — N2581 Secondary hyperparathyroidism of renal origin: Secondary | ICD-10-CM | POA: Diagnosis not present

## 2018-03-25 DIAGNOSIS — Z4932 Encounter for adequacy testing for peritoneal dialysis: Secondary | ICD-10-CM | POA: Diagnosis not present

## 2018-03-25 DIAGNOSIS — N2589 Other disorders resulting from impaired renal tubular function: Secondary | ICD-10-CM | POA: Diagnosis not present

## 2018-03-25 DIAGNOSIS — D631 Anemia in chronic kidney disease: Secondary | ICD-10-CM | POA: Diagnosis not present

## 2018-03-25 DIAGNOSIS — N186 End stage renal disease: Secondary | ICD-10-CM | POA: Diagnosis not present

## 2018-03-26 DIAGNOSIS — D509 Iron deficiency anemia, unspecified: Secondary | ICD-10-CM | POA: Diagnosis not present

## 2018-03-26 DIAGNOSIS — Z4932 Encounter for adequacy testing for peritoneal dialysis: Secondary | ICD-10-CM | POA: Diagnosis not present

## 2018-03-26 DIAGNOSIS — N2589 Other disorders resulting from impaired renal tubular function: Secondary | ICD-10-CM | POA: Diagnosis not present

## 2018-03-26 DIAGNOSIS — N186 End stage renal disease: Secondary | ICD-10-CM | POA: Diagnosis not present

## 2018-03-26 DIAGNOSIS — N2581 Secondary hyperparathyroidism of renal origin: Secondary | ICD-10-CM | POA: Diagnosis not present

## 2018-03-26 DIAGNOSIS — D631 Anemia in chronic kidney disease: Secondary | ICD-10-CM | POA: Diagnosis not present

## 2018-03-27 DIAGNOSIS — Z4932 Encounter for adequacy testing for peritoneal dialysis: Secondary | ICD-10-CM | POA: Diagnosis not present

## 2018-03-27 DIAGNOSIS — N186 End stage renal disease: Secondary | ICD-10-CM | POA: Diagnosis not present

## 2018-03-27 DIAGNOSIS — N2581 Secondary hyperparathyroidism of renal origin: Secondary | ICD-10-CM | POA: Diagnosis not present

## 2018-03-27 DIAGNOSIS — N2589 Other disorders resulting from impaired renal tubular function: Secondary | ICD-10-CM | POA: Diagnosis not present

## 2018-03-27 DIAGNOSIS — D509 Iron deficiency anemia, unspecified: Secondary | ICD-10-CM | POA: Diagnosis not present

## 2018-03-27 DIAGNOSIS — D631 Anemia in chronic kidney disease: Secondary | ICD-10-CM | POA: Diagnosis not present

## 2018-03-28 DIAGNOSIS — N2589 Other disorders resulting from impaired renal tubular function: Secondary | ICD-10-CM | POA: Diagnosis not present

## 2018-03-28 DIAGNOSIS — Z4932 Encounter for adequacy testing for peritoneal dialysis: Secondary | ICD-10-CM | POA: Diagnosis not present

## 2018-03-28 DIAGNOSIS — N2581 Secondary hyperparathyroidism of renal origin: Secondary | ICD-10-CM | POA: Diagnosis not present

## 2018-03-28 DIAGNOSIS — N186 End stage renal disease: Secondary | ICD-10-CM | POA: Diagnosis not present

## 2018-03-28 DIAGNOSIS — D509 Iron deficiency anemia, unspecified: Secondary | ICD-10-CM | POA: Diagnosis not present

## 2018-03-28 DIAGNOSIS — D631 Anemia in chronic kidney disease: Secondary | ICD-10-CM | POA: Diagnosis not present

## 2018-03-29 DIAGNOSIS — N2581 Secondary hyperparathyroidism of renal origin: Secondary | ICD-10-CM | POA: Diagnosis not present

## 2018-03-29 DIAGNOSIS — N186 End stage renal disease: Secondary | ICD-10-CM | POA: Diagnosis not present

## 2018-03-29 DIAGNOSIS — D631 Anemia in chronic kidney disease: Secondary | ICD-10-CM | POA: Diagnosis not present

## 2018-03-29 DIAGNOSIS — Z4932 Encounter for adequacy testing for peritoneal dialysis: Secondary | ICD-10-CM | POA: Diagnosis not present

## 2018-03-29 DIAGNOSIS — D509 Iron deficiency anemia, unspecified: Secondary | ICD-10-CM | POA: Diagnosis not present

## 2018-03-29 DIAGNOSIS — N2589 Other disorders resulting from impaired renal tubular function: Secondary | ICD-10-CM | POA: Diagnosis not present

## 2018-03-30 DIAGNOSIS — Z4932 Encounter for adequacy testing for peritoneal dialysis: Secondary | ICD-10-CM | POA: Diagnosis not present

## 2018-03-30 DIAGNOSIS — D631 Anemia in chronic kidney disease: Secondary | ICD-10-CM | POA: Diagnosis not present

## 2018-03-30 DIAGNOSIS — D509 Iron deficiency anemia, unspecified: Secondary | ICD-10-CM | POA: Diagnosis not present

## 2018-03-30 DIAGNOSIS — N2581 Secondary hyperparathyroidism of renal origin: Secondary | ICD-10-CM | POA: Diagnosis not present

## 2018-03-30 DIAGNOSIS — N2589 Other disorders resulting from impaired renal tubular function: Secondary | ICD-10-CM | POA: Diagnosis not present

## 2018-03-30 DIAGNOSIS — N186 End stage renal disease: Secondary | ICD-10-CM | POA: Diagnosis not present

## 2018-03-31 DIAGNOSIS — D509 Iron deficiency anemia, unspecified: Secondary | ICD-10-CM | POA: Diagnosis not present

## 2018-03-31 DIAGNOSIS — N2581 Secondary hyperparathyroidism of renal origin: Secondary | ICD-10-CM | POA: Diagnosis not present

## 2018-03-31 DIAGNOSIS — D631 Anemia in chronic kidney disease: Secondary | ICD-10-CM | POA: Diagnosis not present

## 2018-03-31 DIAGNOSIS — N186 End stage renal disease: Secondary | ICD-10-CM | POA: Diagnosis not present

## 2018-03-31 DIAGNOSIS — Z4932 Encounter for adequacy testing for peritoneal dialysis: Secondary | ICD-10-CM | POA: Diagnosis not present

## 2018-03-31 DIAGNOSIS — N2589 Other disorders resulting from impaired renal tubular function: Secondary | ICD-10-CM | POA: Diagnosis not present

## 2018-04-01 DIAGNOSIS — Z4932 Encounter for adequacy testing for peritoneal dialysis: Secondary | ICD-10-CM | POA: Diagnosis not present

## 2018-04-01 DIAGNOSIS — N2581 Secondary hyperparathyroidism of renal origin: Secondary | ICD-10-CM | POA: Diagnosis not present

## 2018-04-01 DIAGNOSIS — N186 End stage renal disease: Secondary | ICD-10-CM | POA: Diagnosis not present

## 2018-04-01 DIAGNOSIS — N2589 Other disorders resulting from impaired renal tubular function: Secondary | ICD-10-CM | POA: Diagnosis not present

## 2018-04-01 DIAGNOSIS — D509 Iron deficiency anemia, unspecified: Secondary | ICD-10-CM | POA: Diagnosis not present

## 2018-04-01 DIAGNOSIS — D631 Anemia in chronic kidney disease: Secondary | ICD-10-CM | POA: Diagnosis not present

## 2018-04-02 DIAGNOSIS — Z4932 Encounter for adequacy testing for peritoneal dialysis: Secondary | ICD-10-CM | POA: Diagnosis not present

## 2018-04-02 DIAGNOSIS — D631 Anemia in chronic kidney disease: Secondary | ICD-10-CM | POA: Diagnosis not present

## 2018-04-02 DIAGNOSIS — D509 Iron deficiency anemia, unspecified: Secondary | ICD-10-CM | POA: Diagnosis not present

## 2018-04-02 DIAGNOSIS — N2589 Other disorders resulting from impaired renal tubular function: Secondary | ICD-10-CM | POA: Diagnosis not present

## 2018-04-02 DIAGNOSIS — N2581 Secondary hyperparathyroidism of renal origin: Secondary | ICD-10-CM | POA: Diagnosis not present

## 2018-04-02 DIAGNOSIS — N186 End stage renal disease: Secondary | ICD-10-CM | POA: Diagnosis not present

## 2018-04-03 DIAGNOSIS — Z4932 Encounter for adequacy testing for peritoneal dialysis: Secondary | ICD-10-CM | POA: Diagnosis not present

## 2018-04-03 DIAGNOSIS — D509 Iron deficiency anemia, unspecified: Secondary | ICD-10-CM | POA: Diagnosis not present

## 2018-04-03 DIAGNOSIS — N186 End stage renal disease: Secondary | ICD-10-CM | POA: Diagnosis not present

## 2018-04-03 DIAGNOSIS — Z992 Dependence on renal dialysis: Secondary | ICD-10-CM | POA: Diagnosis not present

## 2018-04-03 DIAGNOSIS — N2889 Other specified disorders of kidney and ureter: Secondary | ICD-10-CM | POA: Diagnosis not present

## 2018-04-03 DIAGNOSIS — N2581 Secondary hyperparathyroidism of renal origin: Secondary | ICD-10-CM | POA: Diagnosis not present

## 2018-04-03 DIAGNOSIS — N2589 Other disorders resulting from impaired renal tubular function: Secondary | ICD-10-CM | POA: Diagnosis not present

## 2018-04-03 DIAGNOSIS — D631 Anemia in chronic kidney disease: Secondary | ICD-10-CM | POA: Diagnosis not present

## 2018-04-04 DIAGNOSIS — E440 Moderate protein-calorie malnutrition: Secondary | ICD-10-CM | POA: Diagnosis not present

## 2018-04-04 DIAGNOSIS — D631 Anemia in chronic kidney disease: Secondary | ICD-10-CM | POA: Diagnosis not present

## 2018-04-04 DIAGNOSIS — N2581 Secondary hyperparathyroidism of renal origin: Secondary | ICD-10-CM | POA: Diagnosis not present

## 2018-04-04 DIAGNOSIS — N186 End stage renal disease: Secondary | ICD-10-CM | POA: Diagnosis not present

## 2018-04-04 DIAGNOSIS — D509 Iron deficiency anemia, unspecified: Secondary | ICD-10-CM | POA: Diagnosis not present

## 2018-04-04 DIAGNOSIS — Z79899 Other long term (current) drug therapy: Secondary | ICD-10-CM | POA: Diagnosis not present

## 2018-04-05 DIAGNOSIS — D509 Iron deficiency anemia, unspecified: Secondary | ICD-10-CM | POA: Diagnosis not present

## 2018-04-05 DIAGNOSIS — E440 Moderate protein-calorie malnutrition: Secondary | ICD-10-CM | POA: Diagnosis not present

## 2018-04-05 DIAGNOSIS — N2581 Secondary hyperparathyroidism of renal origin: Secondary | ICD-10-CM | POA: Diagnosis not present

## 2018-04-05 DIAGNOSIS — D631 Anemia in chronic kidney disease: Secondary | ICD-10-CM | POA: Diagnosis not present

## 2018-04-05 DIAGNOSIS — N186 End stage renal disease: Secondary | ICD-10-CM | POA: Diagnosis not present

## 2018-04-05 DIAGNOSIS — Z79899 Other long term (current) drug therapy: Secondary | ICD-10-CM | POA: Diagnosis not present

## 2018-04-06 DIAGNOSIS — E440 Moderate protein-calorie malnutrition: Secondary | ICD-10-CM | POA: Diagnosis not present

## 2018-04-06 DIAGNOSIS — Z79899 Other long term (current) drug therapy: Secondary | ICD-10-CM | POA: Diagnosis not present

## 2018-04-06 DIAGNOSIS — N2581 Secondary hyperparathyroidism of renal origin: Secondary | ICD-10-CM | POA: Diagnosis not present

## 2018-04-06 DIAGNOSIS — N186 End stage renal disease: Secondary | ICD-10-CM | POA: Diagnosis not present

## 2018-04-06 DIAGNOSIS — D631 Anemia in chronic kidney disease: Secondary | ICD-10-CM | POA: Diagnosis not present

## 2018-04-06 DIAGNOSIS — D509 Iron deficiency anemia, unspecified: Secondary | ICD-10-CM | POA: Diagnosis not present

## 2018-04-07 DIAGNOSIS — N186 End stage renal disease: Secondary | ICD-10-CM | POA: Diagnosis not present

## 2018-04-07 DIAGNOSIS — D631 Anemia in chronic kidney disease: Secondary | ICD-10-CM | POA: Diagnosis not present

## 2018-04-07 DIAGNOSIS — D509 Iron deficiency anemia, unspecified: Secondary | ICD-10-CM | POA: Diagnosis not present

## 2018-04-07 DIAGNOSIS — E440 Moderate protein-calorie malnutrition: Secondary | ICD-10-CM | POA: Diagnosis not present

## 2018-04-07 DIAGNOSIS — N2581 Secondary hyperparathyroidism of renal origin: Secondary | ICD-10-CM | POA: Diagnosis not present

## 2018-04-07 DIAGNOSIS — Z79899 Other long term (current) drug therapy: Secondary | ICD-10-CM | POA: Diagnosis not present

## 2018-04-08 DIAGNOSIS — E440 Moderate protein-calorie malnutrition: Secondary | ICD-10-CM | POA: Diagnosis not present

## 2018-04-08 DIAGNOSIS — D631 Anemia in chronic kidney disease: Secondary | ICD-10-CM | POA: Diagnosis not present

## 2018-04-08 DIAGNOSIS — N2581 Secondary hyperparathyroidism of renal origin: Secondary | ICD-10-CM | POA: Diagnosis not present

## 2018-04-08 DIAGNOSIS — Z79899 Other long term (current) drug therapy: Secondary | ICD-10-CM | POA: Diagnosis not present

## 2018-04-08 DIAGNOSIS — N186 End stage renal disease: Secondary | ICD-10-CM | POA: Diagnosis not present

## 2018-04-08 DIAGNOSIS — D509 Iron deficiency anemia, unspecified: Secondary | ICD-10-CM | POA: Diagnosis not present

## 2018-04-09 DIAGNOSIS — Z79899 Other long term (current) drug therapy: Secondary | ICD-10-CM | POA: Diagnosis not present

## 2018-04-09 DIAGNOSIS — N2581 Secondary hyperparathyroidism of renal origin: Secondary | ICD-10-CM | POA: Diagnosis not present

## 2018-04-09 DIAGNOSIS — E440 Moderate protein-calorie malnutrition: Secondary | ICD-10-CM | POA: Diagnosis not present

## 2018-04-09 DIAGNOSIS — D631 Anemia in chronic kidney disease: Secondary | ICD-10-CM | POA: Diagnosis not present

## 2018-04-09 DIAGNOSIS — D509 Iron deficiency anemia, unspecified: Secondary | ICD-10-CM | POA: Diagnosis not present

## 2018-04-09 DIAGNOSIS — N186 End stage renal disease: Secondary | ICD-10-CM | POA: Diagnosis not present

## 2018-04-10 DIAGNOSIS — D631 Anemia in chronic kidney disease: Secondary | ICD-10-CM | POA: Diagnosis not present

## 2018-04-10 DIAGNOSIS — E440 Moderate protein-calorie malnutrition: Secondary | ICD-10-CM | POA: Diagnosis not present

## 2018-04-10 DIAGNOSIS — N186 End stage renal disease: Secondary | ICD-10-CM | POA: Diagnosis not present

## 2018-04-10 DIAGNOSIS — N2581 Secondary hyperparathyroidism of renal origin: Secondary | ICD-10-CM | POA: Diagnosis not present

## 2018-04-10 DIAGNOSIS — Z79899 Other long term (current) drug therapy: Secondary | ICD-10-CM | POA: Diagnosis not present

## 2018-04-10 DIAGNOSIS — D509 Iron deficiency anemia, unspecified: Secondary | ICD-10-CM | POA: Diagnosis not present

## 2018-04-11 DIAGNOSIS — E440 Moderate protein-calorie malnutrition: Secondary | ICD-10-CM | POA: Diagnosis not present

## 2018-04-11 DIAGNOSIS — D509 Iron deficiency anemia, unspecified: Secondary | ICD-10-CM | POA: Diagnosis not present

## 2018-04-11 DIAGNOSIS — D631 Anemia in chronic kidney disease: Secondary | ICD-10-CM | POA: Diagnosis not present

## 2018-04-11 DIAGNOSIS — Z79899 Other long term (current) drug therapy: Secondary | ICD-10-CM | POA: Diagnosis not present

## 2018-04-11 DIAGNOSIS — N186 End stage renal disease: Secondary | ICD-10-CM | POA: Diagnosis not present

## 2018-04-11 DIAGNOSIS — N2581 Secondary hyperparathyroidism of renal origin: Secondary | ICD-10-CM | POA: Diagnosis not present

## 2018-04-11 DIAGNOSIS — R82998 Other abnormal findings in urine: Secondary | ICD-10-CM | POA: Diagnosis not present

## 2018-04-12 DIAGNOSIS — D509 Iron deficiency anemia, unspecified: Secondary | ICD-10-CM | POA: Diagnosis not present

## 2018-04-12 DIAGNOSIS — Z79899 Other long term (current) drug therapy: Secondary | ICD-10-CM | POA: Diagnosis not present

## 2018-04-12 DIAGNOSIS — N2581 Secondary hyperparathyroidism of renal origin: Secondary | ICD-10-CM | POA: Diagnosis not present

## 2018-04-12 DIAGNOSIS — N186 End stage renal disease: Secondary | ICD-10-CM | POA: Diagnosis not present

## 2018-04-12 DIAGNOSIS — D631 Anemia in chronic kidney disease: Secondary | ICD-10-CM | POA: Diagnosis not present

## 2018-04-12 DIAGNOSIS — E440 Moderate protein-calorie malnutrition: Secondary | ICD-10-CM | POA: Diagnosis not present

## 2018-04-13 DIAGNOSIS — E440 Moderate protein-calorie malnutrition: Secondary | ICD-10-CM | POA: Diagnosis not present

## 2018-04-13 DIAGNOSIS — N186 End stage renal disease: Secondary | ICD-10-CM | POA: Diagnosis not present

## 2018-04-13 DIAGNOSIS — D631 Anemia in chronic kidney disease: Secondary | ICD-10-CM | POA: Diagnosis not present

## 2018-04-13 DIAGNOSIS — Z79899 Other long term (current) drug therapy: Secondary | ICD-10-CM | POA: Diagnosis not present

## 2018-04-13 DIAGNOSIS — D509 Iron deficiency anemia, unspecified: Secondary | ICD-10-CM | POA: Diagnosis not present

## 2018-04-13 DIAGNOSIS — N2581 Secondary hyperparathyroidism of renal origin: Secondary | ICD-10-CM | POA: Diagnosis not present

## 2018-04-14 DIAGNOSIS — D631 Anemia in chronic kidney disease: Secondary | ICD-10-CM | POA: Diagnosis not present

## 2018-04-14 DIAGNOSIS — N2581 Secondary hyperparathyroidism of renal origin: Secondary | ICD-10-CM | POA: Diagnosis not present

## 2018-04-14 DIAGNOSIS — D509 Iron deficiency anemia, unspecified: Secondary | ICD-10-CM | POA: Diagnosis not present

## 2018-04-14 DIAGNOSIS — E440 Moderate protein-calorie malnutrition: Secondary | ICD-10-CM | POA: Diagnosis not present

## 2018-04-14 DIAGNOSIS — Z79899 Other long term (current) drug therapy: Secondary | ICD-10-CM | POA: Diagnosis not present

## 2018-04-14 DIAGNOSIS — N186 End stage renal disease: Secondary | ICD-10-CM | POA: Diagnosis not present

## 2018-04-15 DIAGNOSIS — N186 End stage renal disease: Secondary | ICD-10-CM | POA: Diagnosis not present

## 2018-04-15 DIAGNOSIS — Z79899 Other long term (current) drug therapy: Secondary | ICD-10-CM | POA: Diagnosis not present

## 2018-04-15 DIAGNOSIS — D631 Anemia in chronic kidney disease: Secondary | ICD-10-CM | POA: Diagnosis not present

## 2018-04-15 DIAGNOSIS — E440 Moderate protein-calorie malnutrition: Secondary | ICD-10-CM | POA: Diagnosis not present

## 2018-04-15 DIAGNOSIS — N2581 Secondary hyperparathyroidism of renal origin: Secondary | ICD-10-CM | POA: Diagnosis not present

## 2018-04-15 DIAGNOSIS — D509 Iron deficiency anemia, unspecified: Secondary | ICD-10-CM | POA: Diagnosis not present

## 2018-04-16 DIAGNOSIS — N2581 Secondary hyperparathyroidism of renal origin: Secondary | ICD-10-CM | POA: Diagnosis not present

## 2018-04-16 DIAGNOSIS — E440 Moderate protein-calorie malnutrition: Secondary | ICD-10-CM | POA: Diagnosis not present

## 2018-04-16 DIAGNOSIS — D509 Iron deficiency anemia, unspecified: Secondary | ICD-10-CM | POA: Diagnosis not present

## 2018-04-16 DIAGNOSIS — D631 Anemia in chronic kidney disease: Secondary | ICD-10-CM | POA: Diagnosis not present

## 2018-04-16 DIAGNOSIS — Z79899 Other long term (current) drug therapy: Secondary | ICD-10-CM | POA: Diagnosis not present

## 2018-04-16 DIAGNOSIS — N186 End stage renal disease: Secondary | ICD-10-CM | POA: Diagnosis not present

## 2018-04-17 DIAGNOSIS — N186 End stage renal disease: Secondary | ICD-10-CM | POA: Diagnosis not present

## 2018-04-17 DIAGNOSIS — D509 Iron deficiency anemia, unspecified: Secondary | ICD-10-CM | POA: Diagnosis not present

## 2018-04-17 DIAGNOSIS — Z79899 Other long term (current) drug therapy: Secondary | ICD-10-CM | POA: Diagnosis not present

## 2018-04-17 DIAGNOSIS — N2581 Secondary hyperparathyroidism of renal origin: Secondary | ICD-10-CM | POA: Diagnosis not present

## 2018-04-17 DIAGNOSIS — E440 Moderate protein-calorie malnutrition: Secondary | ICD-10-CM | POA: Diagnosis not present

## 2018-04-17 DIAGNOSIS — D631 Anemia in chronic kidney disease: Secondary | ICD-10-CM | POA: Diagnosis not present

## 2018-04-18 DIAGNOSIS — D509 Iron deficiency anemia, unspecified: Secondary | ICD-10-CM | POA: Diagnosis not present

## 2018-04-18 DIAGNOSIS — D631 Anemia in chronic kidney disease: Secondary | ICD-10-CM | POA: Diagnosis not present

## 2018-04-18 DIAGNOSIS — Z79899 Other long term (current) drug therapy: Secondary | ICD-10-CM | POA: Diagnosis not present

## 2018-04-18 DIAGNOSIS — N186 End stage renal disease: Secondary | ICD-10-CM | POA: Diagnosis not present

## 2018-04-18 DIAGNOSIS — N2581 Secondary hyperparathyroidism of renal origin: Secondary | ICD-10-CM | POA: Diagnosis not present

## 2018-04-18 DIAGNOSIS — E440 Moderate protein-calorie malnutrition: Secondary | ICD-10-CM | POA: Diagnosis not present

## 2018-04-19 DIAGNOSIS — N186 End stage renal disease: Secondary | ICD-10-CM | POA: Diagnosis not present

## 2018-04-19 DIAGNOSIS — N2581 Secondary hyperparathyroidism of renal origin: Secondary | ICD-10-CM | POA: Diagnosis not present

## 2018-04-19 DIAGNOSIS — D631 Anemia in chronic kidney disease: Secondary | ICD-10-CM | POA: Diagnosis not present

## 2018-04-19 DIAGNOSIS — Z79899 Other long term (current) drug therapy: Secondary | ICD-10-CM | POA: Diagnosis not present

## 2018-04-19 DIAGNOSIS — D509 Iron deficiency anemia, unspecified: Secondary | ICD-10-CM | POA: Diagnosis not present

## 2018-04-19 DIAGNOSIS — E440 Moderate protein-calorie malnutrition: Secondary | ICD-10-CM | POA: Diagnosis not present

## 2018-04-20 DIAGNOSIS — N2581 Secondary hyperparathyroidism of renal origin: Secondary | ICD-10-CM | POA: Diagnosis not present

## 2018-04-20 DIAGNOSIS — D631 Anemia in chronic kidney disease: Secondary | ICD-10-CM | POA: Diagnosis not present

## 2018-04-20 DIAGNOSIS — N186 End stage renal disease: Secondary | ICD-10-CM | POA: Diagnosis not present

## 2018-04-20 DIAGNOSIS — E440 Moderate protein-calorie malnutrition: Secondary | ICD-10-CM | POA: Diagnosis not present

## 2018-04-20 DIAGNOSIS — D509 Iron deficiency anemia, unspecified: Secondary | ICD-10-CM | POA: Diagnosis not present

## 2018-04-20 DIAGNOSIS — Z79899 Other long term (current) drug therapy: Secondary | ICD-10-CM | POA: Diagnosis not present

## 2018-04-21 DIAGNOSIS — D631 Anemia in chronic kidney disease: Secondary | ICD-10-CM | POA: Diagnosis not present

## 2018-04-21 DIAGNOSIS — E440 Moderate protein-calorie malnutrition: Secondary | ICD-10-CM | POA: Diagnosis not present

## 2018-04-21 DIAGNOSIS — Z79899 Other long term (current) drug therapy: Secondary | ICD-10-CM | POA: Diagnosis not present

## 2018-04-21 DIAGNOSIS — N186 End stage renal disease: Secondary | ICD-10-CM | POA: Diagnosis not present

## 2018-04-21 DIAGNOSIS — D509 Iron deficiency anemia, unspecified: Secondary | ICD-10-CM | POA: Diagnosis not present

## 2018-04-21 DIAGNOSIS — N2581 Secondary hyperparathyroidism of renal origin: Secondary | ICD-10-CM | POA: Diagnosis not present

## 2018-04-22 DIAGNOSIS — N2581 Secondary hyperparathyroidism of renal origin: Secondary | ICD-10-CM | POA: Diagnosis not present

## 2018-04-22 DIAGNOSIS — N186 End stage renal disease: Secondary | ICD-10-CM | POA: Diagnosis not present

## 2018-04-22 DIAGNOSIS — D509 Iron deficiency anemia, unspecified: Secondary | ICD-10-CM | POA: Diagnosis not present

## 2018-04-22 DIAGNOSIS — D631 Anemia in chronic kidney disease: Secondary | ICD-10-CM | POA: Diagnosis not present

## 2018-04-22 DIAGNOSIS — E440 Moderate protein-calorie malnutrition: Secondary | ICD-10-CM | POA: Diagnosis not present

## 2018-04-22 DIAGNOSIS — Z79899 Other long term (current) drug therapy: Secondary | ICD-10-CM | POA: Diagnosis not present

## 2018-04-23 DIAGNOSIS — E440 Moderate protein-calorie malnutrition: Secondary | ICD-10-CM | POA: Diagnosis not present

## 2018-04-23 DIAGNOSIS — Z79899 Other long term (current) drug therapy: Secondary | ICD-10-CM | POA: Diagnosis not present

## 2018-04-23 DIAGNOSIS — D631 Anemia in chronic kidney disease: Secondary | ICD-10-CM | POA: Diagnosis not present

## 2018-04-23 DIAGNOSIS — N2581 Secondary hyperparathyroidism of renal origin: Secondary | ICD-10-CM | POA: Diagnosis not present

## 2018-04-23 DIAGNOSIS — N186 End stage renal disease: Secondary | ICD-10-CM | POA: Diagnosis not present

## 2018-04-23 DIAGNOSIS — D509 Iron deficiency anemia, unspecified: Secondary | ICD-10-CM | POA: Diagnosis not present

## 2018-04-24 ENCOUNTER — Other Ambulatory Visit: Payer: Self-pay | Admitting: Nephrology

## 2018-04-24 DIAGNOSIS — N186 End stage renal disease: Secondary | ICD-10-CM | POA: Diagnosis not present

## 2018-04-24 DIAGNOSIS — D631 Anemia in chronic kidney disease: Secondary | ICD-10-CM | POA: Diagnosis not present

## 2018-04-24 DIAGNOSIS — D509 Iron deficiency anemia, unspecified: Secondary | ICD-10-CM | POA: Diagnosis not present

## 2018-04-24 DIAGNOSIS — N2581 Secondary hyperparathyroidism of renal origin: Secondary | ICD-10-CM | POA: Diagnosis not present

## 2018-04-24 DIAGNOSIS — Z79899 Other long term (current) drug therapy: Secondary | ICD-10-CM | POA: Diagnosis not present

## 2018-04-24 DIAGNOSIS — E440 Moderate protein-calorie malnutrition: Secondary | ICD-10-CM | POA: Diagnosis not present

## 2018-04-25 ENCOUNTER — Other Ambulatory Visit: Payer: Self-pay | Admitting: Nephrology

## 2018-04-25 ENCOUNTER — Other Ambulatory Visit (HOSPITAL_COMMUNITY): Payer: Self-pay | Admitting: Nephrology

## 2018-04-25 ENCOUNTER — Ambulatory Visit (HOSPITAL_COMMUNITY)
Admission: RE | Admit: 2018-04-25 | Discharge: 2018-04-25 | Disposition: A | Discharge status: Home / Self Care | Payer: MEDICARE | Source: Ambulatory Visit | Attending: Nephrology | Admitting: Nephrology

## 2018-04-25 DIAGNOSIS — I700 Atherosclerosis of aorta: Secondary | ICD-10-CM | POA: Diagnosis not present

## 2018-04-25 DIAGNOSIS — R109 Unspecified abdominal pain: Secondary | ICD-10-CM | POA: Diagnosis not present

## 2018-04-25 DIAGNOSIS — N2581 Secondary hyperparathyroidism of renal origin: Secondary | ICD-10-CM | POA: Diagnosis not present

## 2018-04-25 DIAGNOSIS — N186 End stage renal disease: Secondary | ICD-10-CM | POA: Diagnosis not present

## 2018-04-25 DIAGNOSIS — D509 Iron deficiency anemia, unspecified: Secondary | ICD-10-CM | POA: Diagnosis not present

## 2018-04-25 DIAGNOSIS — N261 Atrophy of kidney (terminal): Secondary | ICD-10-CM | POA: Diagnosis not present

## 2018-04-25 DIAGNOSIS — Z79899 Other long term (current) drug therapy: Secondary | ICD-10-CM | POA: Diagnosis not present

## 2018-04-25 DIAGNOSIS — D631 Anemia in chronic kidney disease: Secondary | ICD-10-CM | POA: Diagnosis not present

## 2018-04-25 DIAGNOSIS — E440 Moderate protein-calorie malnutrition: Secondary | ICD-10-CM | POA: Diagnosis not present

## 2018-04-25 MED ORDER — IOPAMIDOL (ISOVUE-300) INJECTION 61%
30.0000 mL | Freq: Once | INTRAVENOUS | Status: AC | PRN
  Administered 2018-04-25: 30 mL via ORAL

## 2018-04-25 MED ORDER — IOHEXOL 300 MG/ML  SOLN
100.0000 mL | Freq: Once | INTRAMUSCULAR | Status: AC | PRN
  Administered 2018-04-25: 100 mL via INTRAVENOUS

## 2018-04-25 MED ORDER — IOPAMIDOL (ISOVUE-300) INJECTION 61%
INTRAVENOUS | Status: AC
  Filled 2018-04-25: qty 30

## 2018-04-26 DIAGNOSIS — Z79899 Other long term (current) drug therapy: Secondary | ICD-10-CM | POA: Diagnosis not present

## 2018-04-26 DIAGNOSIS — Z992 Dependence on renal dialysis: Secondary | ICD-10-CM | POA: Diagnosis not present

## 2018-04-26 DIAGNOSIS — N186 End stage renal disease: Secondary | ICD-10-CM | POA: Diagnosis not present

## 2018-04-26 DIAGNOSIS — D509 Iron deficiency anemia, unspecified: Secondary | ICD-10-CM | POA: Diagnosis not present

## 2018-04-26 DIAGNOSIS — N2581 Secondary hyperparathyroidism of renal origin: Secondary | ICD-10-CM | POA: Diagnosis not present

## 2018-04-26 DIAGNOSIS — E039 Hypothyroidism, unspecified: Secondary | ICD-10-CM | POA: Diagnosis not present

## 2018-04-26 DIAGNOSIS — E440 Moderate protein-calorie malnutrition: Secondary | ICD-10-CM | POA: Diagnosis not present

## 2018-04-26 DIAGNOSIS — Z712 Person consulting for explanation of examination or test findings: Secondary | ICD-10-CM | POA: Diagnosis not present

## 2018-04-26 DIAGNOSIS — D631 Anemia in chronic kidney disease: Secondary | ICD-10-CM | POA: Diagnosis not present

## 2018-04-26 LAB — TSH: TSH: 4.75 (ref <=5.90)

## 2018-04-27 DIAGNOSIS — N186 End stage renal disease: Secondary | ICD-10-CM | POA: Diagnosis not present

## 2018-04-27 DIAGNOSIS — Z79899 Other long term (current) drug therapy: Secondary | ICD-10-CM | POA: Diagnosis not present

## 2018-04-27 DIAGNOSIS — E440 Moderate protein-calorie malnutrition: Secondary | ICD-10-CM | POA: Diagnosis not present

## 2018-04-27 DIAGNOSIS — N2581 Secondary hyperparathyroidism of renal origin: Secondary | ICD-10-CM | POA: Diagnosis not present

## 2018-04-27 DIAGNOSIS — D509 Iron deficiency anemia, unspecified: Secondary | ICD-10-CM | POA: Diagnosis not present

## 2018-04-27 DIAGNOSIS — D631 Anemia in chronic kidney disease: Secondary | ICD-10-CM | POA: Diagnosis not present

## 2018-04-28 DIAGNOSIS — D509 Iron deficiency anemia, unspecified: Secondary | ICD-10-CM | POA: Diagnosis not present

## 2018-04-28 DIAGNOSIS — E440 Moderate protein-calorie malnutrition: Secondary | ICD-10-CM | POA: Diagnosis not present

## 2018-04-28 DIAGNOSIS — D631 Anemia in chronic kidney disease: Secondary | ICD-10-CM | POA: Diagnosis not present

## 2018-04-28 DIAGNOSIS — N2581 Secondary hyperparathyroidism of renal origin: Secondary | ICD-10-CM | POA: Diagnosis not present

## 2018-04-28 DIAGNOSIS — Z79899 Other long term (current) drug therapy: Secondary | ICD-10-CM | POA: Diagnosis not present

## 2018-04-28 DIAGNOSIS — N186 End stage renal disease: Secondary | ICD-10-CM | POA: Diagnosis not present

## 2018-04-29 DIAGNOSIS — E440 Moderate protein-calorie malnutrition: Secondary | ICD-10-CM | POA: Diagnosis not present

## 2018-04-29 DIAGNOSIS — D631 Anemia in chronic kidney disease: Secondary | ICD-10-CM | POA: Diagnosis not present

## 2018-04-29 DIAGNOSIS — N186 End stage renal disease: Secondary | ICD-10-CM | POA: Diagnosis not present

## 2018-04-29 DIAGNOSIS — D509 Iron deficiency anemia, unspecified: Secondary | ICD-10-CM | POA: Diagnosis not present

## 2018-04-29 DIAGNOSIS — N2581 Secondary hyperparathyroidism of renal origin: Secondary | ICD-10-CM | POA: Diagnosis not present

## 2018-04-29 DIAGNOSIS — Z79899 Other long term (current) drug therapy: Secondary | ICD-10-CM | POA: Diagnosis not present

## 2018-04-30 DIAGNOSIS — D509 Iron deficiency anemia, unspecified: Secondary | ICD-10-CM | POA: Diagnosis not present

## 2018-04-30 DIAGNOSIS — D631 Anemia in chronic kidney disease: Secondary | ICD-10-CM | POA: Diagnosis not present

## 2018-04-30 DIAGNOSIS — E440 Moderate protein-calorie malnutrition: Secondary | ICD-10-CM | POA: Diagnosis not present

## 2018-04-30 DIAGNOSIS — N186 End stage renal disease: Secondary | ICD-10-CM | POA: Diagnosis not present

## 2018-04-30 DIAGNOSIS — N2581 Secondary hyperparathyroidism of renal origin: Secondary | ICD-10-CM | POA: Diagnosis not present

## 2018-04-30 DIAGNOSIS — Z79899 Other long term (current) drug therapy: Secondary | ICD-10-CM | POA: Diagnosis not present

## 2018-05-01 ENCOUNTER — Other Ambulatory Visit: Payer: MEDICARE

## 2018-05-01 DIAGNOSIS — E440 Moderate protein-calorie malnutrition: Secondary | ICD-10-CM | POA: Diagnosis not present

## 2018-05-01 DIAGNOSIS — D631 Anemia in chronic kidney disease: Secondary | ICD-10-CM | POA: Diagnosis not present

## 2018-05-01 DIAGNOSIS — N186 End stage renal disease: Secondary | ICD-10-CM | POA: Diagnosis not present

## 2018-05-01 DIAGNOSIS — N2581 Secondary hyperparathyroidism of renal origin: Secondary | ICD-10-CM | POA: Diagnosis not present

## 2018-05-01 DIAGNOSIS — D509 Iron deficiency anemia, unspecified: Secondary | ICD-10-CM | POA: Diagnosis not present

## 2018-05-01 DIAGNOSIS — Z79899 Other long term (current) drug therapy: Secondary | ICD-10-CM | POA: Diagnosis not present

## 2018-05-02 DIAGNOSIS — Z79899 Other long term (current) drug therapy: Secondary | ICD-10-CM | POA: Diagnosis not present

## 2018-05-02 DIAGNOSIS — N186 End stage renal disease: Secondary | ICD-10-CM | POA: Diagnosis not present

## 2018-05-02 DIAGNOSIS — D631 Anemia in chronic kidney disease: Secondary | ICD-10-CM | POA: Diagnosis not present

## 2018-05-02 DIAGNOSIS — N2581 Secondary hyperparathyroidism of renal origin: Secondary | ICD-10-CM | POA: Diagnosis not present

## 2018-05-02 DIAGNOSIS — E440 Moderate protein-calorie malnutrition: Secondary | ICD-10-CM | POA: Diagnosis not present

## 2018-05-02 DIAGNOSIS — D509 Iron deficiency anemia, unspecified: Secondary | ICD-10-CM | POA: Diagnosis not present

## 2018-05-03 DIAGNOSIS — E440 Moderate protein-calorie malnutrition: Secondary | ICD-10-CM | POA: Diagnosis not present

## 2018-05-03 DIAGNOSIS — N186 End stage renal disease: Secondary | ICD-10-CM | POA: Diagnosis not present

## 2018-05-03 DIAGNOSIS — D631 Anemia in chronic kidney disease: Secondary | ICD-10-CM | POA: Diagnosis not present

## 2018-05-03 DIAGNOSIS — Z79899 Other long term (current) drug therapy: Secondary | ICD-10-CM | POA: Diagnosis not present

## 2018-05-03 DIAGNOSIS — D509 Iron deficiency anemia, unspecified: Secondary | ICD-10-CM | POA: Diagnosis not present

## 2018-05-03 DIAGNOSIS — N2581 Secondary hyperparathyroidism of renal origin: Secondary | ICD-10-CM | POA: Diagnosis not present

## 2018-05-04 DIAGNOSIS — Z79899 Other long term (current) drug therapy: Secondary | ICD-10-CM | POA: Diagnosis not present

## 2018-05-04 DIAGNOSIS — N186 End stage renal disease: Secondary | ICD-10-CM | POA: Diagnosis not present

## 2018-05-04 DIAGNOSIS — D631 Anemia in chronic kidney disease: Secondary | ICD-10-CM | POA: Diagnosis not present

## 2018-05-04 DIAGNOSIS — N2581 Secondary hyperparathyroidism of renal origin: Secondary | ICD-10-CM | POA: Diagnosis not present

## 2018-05-04 DIAGNOSIS — D509 Iron deficiency anemia, unspecified: Secondary | ICD-10-CM | POA: Diagnosis not present

## 2018-05-04 DIAGNOSIS — Z992 Dependence on renal dialysis: Secondary | ICD-10-CM | POA: Diagnosis not present

## 2018-05-04 DIAGNOSIS — N2889 Other specified disorders of kidney and ureter: Secondary | ICD-10-CM | POA: Diagnosis not present

## 2018-05-04 DIAGNOSIS — E440 Moderate protein-calorie malnutrition: Secondary | ICD-10-CM | POA: Diagnosis not present

## 2018-05-05 DIAGNOSIS — N2581 Secondary hyperparathyroidism of renal origin: Secondary | ICD-10-CM | POA: Diagnosis not present

## 2018-05-05 DIAGNOSIS — N186 End stage renal disease: Secondary | ICD-10-CM | POA: Diagnosis not present

## 2018-05-06 DIAGNOSIS — N2581 Secondary hyperparathyroidism of renal origin: Secondary | ICD-10-CM | POA: Diagnosis not present

## 2018-05-06 DIAGNOSIS — N186 End stage renal disease: Secondary | ICD-10-CM | POA: Diagnosis not present

## 2018-05-07 DIAGNOSIS — N2581 Secondary hyperparathyroidism of renal origin: Secondary | ICD-10-CM | POA: Diagnosis not present

## 2018-05-07 DIAGNOSIS — N186 End stage renal disease: Secondary | ICD-10-CM | POA: Diagnosis not present

## 2018-05-08 DIAGNOSIS — N2581 Secondary hyperparathyroidism of renal origin: Secondary | ICD-10-CM | POA: Diagnosis not present

## 2018-05-08 DIAGNOSIS — N186 End stage renal disease: Secondary | ICD-10-CM | POA: Diagnosis not present

## 2018-05-09 DIAGNOSIS — N2581 Secondary hyperparathyroidism of renal origin: Secondary | ICD-10-CM | POA: Diagnosis not present

## 2018-05-09 DIAGNOSIS — N186 End stage renal disease: Secondary | ICD-10-CM | POA: Diagnosis not present

## 2018-05-10 DIAGNOSIS — Z992 Dependence on renal dialysis: Secondary | ICD-10-CM | POA: Diagnosis not present

## 2018-05-10 DIAGNOSIS — N200 Calculus of kidney: Secondary | ICD-10-CM | POA: Diagnosis present

## 2018-05-10 DIAGNOSIS — D573 Sickle-cell trait: Secondary | ICD-10-CM | POA: Diagnosis present

## 2018-05-10 DIAGNOSIS — Z881 Allergy status to other antibiotic agents status: Secondary | ICD-10-CM | POA: Diagnosis not present

## 2018-05-10 DIAGNOSIS — N2581 Secondary hyperparathyroidism of renal origin: Secondary | ICD-10-CM | POA: Diagnosis present

## 2018-05-10 DIAGNOSIS — N185 Chronic kidney disease, stage 5: Secondary | ICD-10-CM | POA: Diagnosis not present

## 2018-05-10 DIAGNOSIS — E785 Hyperlipidemia, unspecified: Secondary | ICD-10-CM | POA: Diagnosis present

## 2018-05-10 DIAGNOSIS — R739 Hyperglycemia, unspecified: Secondary | ICD-10-CM | POA: Diagnosis present

## 2018-05-10 DIAGNOSIS — R7881 Bacteremia: Secondary | ICD-10-CM | POA: Diagnosis not present

## 2018-05-10 DIAGNOSIS — D899 Disorder involving the immune mechanism, unspecified: Secondary | ICD-10-CM | POA: Diagnosis not present

## 2018-05-10 DIAGNOSIS — Z792 Long term (current) use of antibiotics: Secondary | ICD-10-CM | POA: Diagnosis not present

## 2018-05-10 DIAGNOSIS — Z1623 Resistance to quinolones and fluoroquinolones: Secondary | ICD-10-CM | POA: Diagnosis present

## 2018-05-10 DIAGNOSIS — R918 Other nonspecific abnormal finding of lung field: Secondary | ICD-10-CM | POA: Diagnosis not present

## 2018-05-10 DIAGNOSIS — I10 Essential (primary) hypertension: Secondary | ICD-10-CM | POA: Diagnosis not present

## 2018-05-10 DIAGNOSIS — J449 Chronic obstructive pulmonary disease, unspecified: Secondary | ICD-10-CM | POA: Diagnosis present

## 2018-05-10 DIAGNOSIS — Z9189 Other specified personal risk factors, not elsewhere classified: Secondary | ICD-10-CM | POA: Diagnosis not present

## 2018-05-10 DIAGNOSIS — Z886 Allergy status to analgesic agent status: Secondary | ICD-10-CM | POA: Diagnosis not present

## 2018-05-10 DIAGNOSIS — E0590 Thyrotoxicosis, unspecified without thyrotoxic crisis or storm: Secondary | ICD-10-CM | POA: Diagnosis present

## 2018-05-10 DIAGNOSIS — Z940 Kidney transplant status: Secondary | ICD-10-CM | POA: Diagnosis not present

## 2018-05-10 DIAGNOSIS — I120 Hypertensive chronic kidney disease with stage 5 chronic kidney disease or end stage renal disease: Secondary | ICD-10-CM | POA: Diagnosis present

## 2018-05-10 DIAGNOSIS — N2889 Other specified disorders of kidney and ureter: Secondary | ICD-10-CM | POA: Diagnosis present

## 2018-05-10 DIAGNOSIS — Z1629 Resistance to other single specified antibiotic: Secondary | ICD-10-CM | POA: Diagnosis present

## 2018-05-10 DIAGNOSIS — D849 Immunodeficiency, unspecified: Secondary | ICD-10-CM | POA: Insufficient documentation

## 2018-05-10 DIAGNOSIS — T380X5A Adverse effect of glucocorticoids and synthetic analogues, initial encounter: Secondary | ICD-10-CM | POA: Diagnosis present

## 2018-05-10 DIAGNOSIS — N186 End stage renal disease: Secondary | ICD-10-CM | POA: Diagnosis present

## 2018-05-10 DIAGNOSIS — Z7682 Awaiting organ transplant status: Secondary | ICD-10-CM | POA: Diagnosis not present

## 2018-05-11 DIAGNOSIS — N2581 Secondary hyperparathyroidism of renal origin: Secondary | ICD-10-CM | POA: Diagnosis not present

## 2018-05-11 DIAGNOSIS — N186 End stage renal disease: Secondary | ICD-10-CM | POA: Diagnosis not present

## 2018-05-12 DIAGNOSIS — N2581 Secondary hyperparathyroidism of renal origin: Secondary | ICD-10-CM | POA: Diagnosis not present

## 2018-05-12 DIAGNOSIS — N186 End stage renal disease: Secondary | ICD-10-CM | POA: Diagnosis not present

## 2018-05-13 HISTORY — PX: KIDNEY TRANSPLANT: SHX239

## 2018-05-17 DIAGNOSIS — Z005 Encounter for examination of potential donor of organ and tissue: Secondary | ICD-10-CM | POA: Diagnosis not present

## 2018-05-17 DIAGNOSIS — Z940 Kidney transplant status: Secondary | ICD-10-CM | POA: Diagnosis not present

## 2018-05-17 DIAGNOSIS — D899 Disorder involving the immune mechanism, unspecified: Secondary | ICD-10-CM | POA: Diagnosis not present

## 2018-05-17 DIAGNOSIS — Z792 Long term (current) use of antibiotics: Secondary | ICD-10-CM | POA: Diagnosis not present

## 2018-05-17 DIAGNOSIS — I10 Essential (primary) hypertension: Secondary | ICD-10-CM | POA: Diagnosis not present

## 2018-05-17 DIAGNOSIS — R7881 Bacteremia: Secondary | ICD-10-CM | POA: Diagnosis not present

## 2018-05-22 DIAGNOSIS — I120 Hypertensive chronic kidney disease with stage 5 chronic kidney disease or end stage renal disease: Secondary | ICD-10-CM | POA: Insufficient documentation

## 2018-05-22 DIAGNOSIS — E559 Vitamin D deficiency, unspecified: Secondary | ICD-10-CM | POA: Insufficient documentation

## 2018-05-22 DIAGNOSIS — N186 End stage renal disease: Secondary | ICD-10-CM

## 2018-05-22 DIAGNOSIS — E039 Hypothyroidism, unspecified: Secondary | ICD-10-CM | POA: Insufficient documentation

## 2018-05-24 DIAGNOSIS — Z792 Long term (current) use of antibiotics: Secondary | ICD-10-CM | POA: Diagnosis not present

## 2018-05-24 DIAGNOSIS — R739 Hyperglycemia, unspecified: Secondary | ICD-10-CM | POA: Diagnosis not present

## 2018-05-24 DIAGNOSIS — T380X5A Adverse effect of glucocorticoids and synthetic analogues, initial encounter: Secondary | ICD-10-CM | POA: Diagnosis not present

## 2018-05-24 DIAGNOSIS — Z09 Encounter for follow-up examination after completed treatment for conditions other than malignant neoplasm: Secondary | ICD-10-CM | POA: Diagnosis not present

## 2018-05-24 DIAGNOSIS — N2889 Other specified disorders of kidney and ureter: Secondary | ICD-10-CM | POA: Diagnosis not present

## 2018-05-24 DIAGNOSIS — Z940 Kidney transplant status: Secondary | ICD-10-CM | POA: Diagnosis not present

## 2018-05-24 DIAGNOSIS — E0590 Thyrotoxicosis, unspecified without thyrotoxic crisis or storm: Secondary | ICD-10-CM | POA: Diagnosis not present

## 2018-05-24 DIAGNOSIS — R7881 Bacteremia: Secondary | ICD-10-CM | POA: Diagnosis not present

## 2018-05-24 DIAGNOSIS — D899 Disorder involving the immune mechanism, unspecified: Secondary | ICD-10-CM | POA: Diagnosis not present

## 2018-05-24 DIAGNOSIS — R8290 Unspecified abnormal findings in urine: Secondary | ICD-10-CM | POA: Diagnosis not present

## 2018-05-24 DIAGNOSIS — Z005 Encounter for examination of potential donor of organ and tissue: Secondary | ICD-10-CM | POA: Diagnosis not present

## 2018-05-24 DIAGNOSIS — N2581 Secondary hyperparathyroidism of renal origin: Secondary | ICD-10-CM | POA: Diagnosis not present

## 2018-05-24 DIAGNOSIS — D573 Sickle-cell trait: Secondary | ICD-10-CM | POA: Diagnosis not present

## 2018-05-24 DIAGNOSIS — I10 Essential (primary) hypertension: Secondary | ICD-10-CM | POA: Diagnosis not present

## 2018-05-31 DIAGNOSIS — Z940 Kidney transplant status: Secondary | ICD-10-CM | POA: Diagnosis not present

## 2018-05-31 DIAGNOSIS — B259 Cytomegaloviral disease, unspecified: Secondary | ICD-10-CM | POA: Diagnosis not present

## 2018-05-31 DIAGNOSIS — Z5181 Encounter for therapeutic drug level monitoring: Secondary | ICD-10-CM | POA: Diagnosis not present

## 2018-05-31 DIAGNOSIS — B349 Viral infection, unspecified: Secondary | ICD-10-CM | POA: Diagnosis not present

## 2018-06-06 DIAGNOSIS — Z940 Kidney transplant status: Secondary | ICD-10-CM | POA: Diagnosis not present

## 2018-06-06 DIAGNOSIS — R799 Abnormal finding of blood chemistry, unspecified: Secondary | ICD-10-CM | POA: Diagnosis not present

## 2018-06-17 DIAGNOSIS — Z5181 Encounter for therapeutic drug level monitoring: Secondary | ICD-10-CM | POA: Diagnosis not present

## 2018-06-17 DIAGNOSIS — B259 Cytomegaloviral disease, unspecified: Secondary | ICD-10-CM | POA: Diagnosis not present

## 2018-06-17 DIAGNOSIS — Z940 Kidney transplant status: Secondary | ICD-10-CM | POA: Diagnosis not present

## 2018-06-17 DIAGNOSIS — B349 Viral infection, unspecified: Secondary | ICD-10-CM | POA: Diagnosis not present

## 2018-06-19 ENCOUNTER — Ambulatory Visit (INDEPENDENT_AMBULATORY_CARE_PROVIDER_SITE_OTHER): Payer: MEDICARE

## 2018-06-19 ENCOUNTER — Ambulatory Visit (INDEPENDENT_AMBULATORY_CARE_PROVIDER_SITE_OTHER): Payer: MEDICARE | Admitting: Nurse Practitioner

## 2018-06-19 VITALS — BP 118/78 | HR 96 | Temp 98.4°F | Ht 61.0 in | Wt 138.0 lb

## 2018-06-19 DIAGNOSIS — Z0000 Encounter for general adult medical examination without abnormal findings: Secondary | ICD-10-CM | POA: Diagnosis not present

## 2018-06-19 DIAGNOSIS — I10 Essential (primary) hypertension: Secondary | ICD-10-CM | POA: Diagnosis not present

## 2018-06-19 DIAGNOSIS — E559 Vitamin D deficiency, unspecified: Secondary | ICD-10-CM | POA: Diagnosis not present

## 2018-06-19 DIAGNOSIS — E039 Hypothyroidism, unspecified: Secondary | ICD-10-CM

## 2018-06-19 DIAGNOSIS — Z940 Kidney transplant status: Secondary | ICD-10-CM

## 2018-06-19 NOTE — Patient Instructions (Addendum)
Ms. Wilfong , Thank you for taking time to come for your Medicare Wellness Visit. I appreciate your ongoing commitment to your health goals. Please review the following plan we discussed and let me know if I can assist you in the future.   Screening recommendations/referrals: Colonoscopy: up to date Mammogram: up to date Bone Density: up to date Recommended yearly ophthalmology/optometry visit for glaucoma screening and checkup Recommended yearly dental visit for hygiene and checkup  Vaccinations: Influenza vaccine: declined Pneumococcal vaccine: declined Tdap vaccine: up to date Shingles vaccine: declined    Advanced directives: Please bring a copy of your POA (Power of Attorney) and/or Living Will to your next appointment.    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 All vaccinations are on hold until further notice.  Next appointment: 06/19/2018 at 4:00p  Preventive Care 40-64 Years, Female Preventive care refers to lifestyle choices and visits with your health care provider that can promote health and wellness. What does preventive care include?  A yearly physical exam. This is also called an annual well check.  Dental exams once or twice a year.  Routine eye exams. Ask your health care provider how often you should have your eyes checked.  Personal lifestyle choices, including:  Daily care of your teeth and gums.  Regular physical activity.  Eating a healthy diet.  Avoiding tobacco and drug use.  Limiting alcohol use.  Practicing safe sex.  Taking low-dose aspirin daily starting at age 101.  Taking vitamin and mineral supplements as recommended by your health care provider. What happens during an annual well check? The services and screenings done by your health care provider during your annual well check will depend on your age, overall health, lifestyle risk factors, and family history of disease. Counseling  Your health care  provider may ask you questions about your:  Alcohol use.  Tobacco use.  Drug use.  Emotional well-being.  Home and relationship well-being.  Sexual activity.  Eating habits.  Work and work Statistician.  Method of birth control.  Menstrual cycle.  Pregnancy history. Screening  You may have the following tests or measurements:  Height, weight, and BMI.  Blood pressure.  Lipid and cholesterol levels. These may be checked every 5 years, or more frequently if you are over 72 years old.  Skin check.  Lung cancer screening. You may have this screening every year starting at age 45 if you have a 30-pack-year history of smoking and currently smoke or have quit within the past 15 years.  Fecal occult blood test (FOBT) of the stool. You may have this test every year starting at age 53.  Flexible sigmoidoscopy or colonoscopy. You may have a sigmoidoscopy every 5 years or a colonoscopy every 10 years starting at age 61.  Hepatitis C blood test.  Hepatitis B blood test.  Sexually transmitted disease (STD) testing.  Diabetes screening. This is done by checking your blood sugar (glucose) after you have not eaten for a while (fasting). You may have this done every 1-3 years.  Mammogram. This may be done every 1-2 years. Talk to your health care provider about when you should start having regular mammograms. This may depend on whether you have a family history of breast cancer.  BRCA-related cancer screening. This may be done if you have a family history of breast, ovarian, tubal, or peritoneal cancers.  Pelvic exam and Pap test. This may be done every 3 years starting at age 5. Starting at age 29, this 8  be done every 5 years if you have a Pap test in combination with an HPV test.  Bone density scan. This is done to screen for osteoporosis. You may have this scan if you are at high risk for osteoporosis. Discuss your test results, treatment options, and if necessary, the need  for more tests with your health care provider. Vaccines  Your health care provider may recommend certain vaccines, such as:  Influenza vaccine. This is recommended every year.  Tetanus, diphtheria, and acellular pertussis (Tdap, Td) vaccine. You may need a Td booster every 10 years.  Zoster vaccine. You may need this after age 21.  Pneumococcal 13-valent conjugate (PCV13) vaccine. You may need this if you have certain conditions and were not previously vaccinated.  Pneumococcal polysaccharide (PPSV23) vaccine. You may need one or two doses if you smoke cigarettes or if you have certain conditions. Talk to your health care provider about which screenings and vaccines you need and how often you need them. This information is not intended to replace advice given to you by your health care provider. Make sure you discuss any questions you have with your health care provider. Document Released: 09/17/2015 Document Revised: 05/10/2016 Document Reviewed: 06/22/2015 Elsevier Interactive Patient Education  2017 [place] Prevention in the Home Falls can cause injuries. They can happen to people of all ages. There are many things you can do to make your home safe and to help prevent falls. What can I do on the outside of my home?  Regularly fix the edges of walkways and driveways and fix any cracks.  Remove anything that might make you trip as you walk through a door, such as a raised step or threshold.  Trim any bushes or trees on the path to your home.  Use bright outdoor lighting.  Clear any walking paths of anything that might make someone trip, such as rocks or tools.  Regularly check to see if handrails are loose or broken. Make sure that both sides of any steps have handrails.  Any raised decks and porches should have guardrails on the edges.  Have any leaves, snow, or ice cleared regularly.  Use sand or salt on walking paths during winter.  Clean up any spills in  your garage right away. This includes oil or grease spills. What can I do in the bathroom?  Use night lights.  Install grab bars by the toilet and in the tub and shower. Do not use towel bars as grab bars.  Use non-skid mats or decals in the tub or shower.  If you need to sit down in the shower, use a plastic, non-slip stool.  Keep the floor dry. Clean up any water that spills on the floor as soon as it happens.  Remove soap buildup in the tub or shower regularly.  Attach bath mats securely with double-sided non-slip rug tape.  Do not have throw rugs and other things on the floor that can make you trip. What can I do in the bedroom?  Use night lights.  Make sure that you have a light by your bed that is easy to reach.  Do not use any sheets or blankets that are too big for your bed. They should not hang down onto the floor.  Have a firm chair that has side arms. You can use this for support while you get dressed.  Do not have throw rugs and other things on the floor that can make you trip.  What can I do in the kitchen?  Clean up any spills right away.  Avoid walking on wet floors.  Keep items that you use a lot in easy-to-reach places.  If you need to reach something above you, use a strong step stool that has a grab bar.  Keep electrical cords out of the way.  Do not use floor polish or wax that makes floors slippery. If you must use wax, use non-skid floor wax.  Do not have throw rugs and other things on the floor that can make you trip. What can I do with my stairs?  Do not leave any items on the stairs.  Make sure that there are handrails on both sides of the stairs and use them. Fix handrails that are broken or loose. Make sure that handrails are as long as the stairways.  Check any carpeting to make sure that it is firmly attached to the stairs. Fix any carpet that is loose or worn.  Avoid having throw rugs at the top or bottom of the stairs. If you do have  throw rugs, attach them to the floor with carpet tape.  Make sure that you have a light switch at the top of the stairs and the bottom of the stairs. If you do not have them, ask someone to add them for you. What else can I do to help prevent falls?  Wear shoes that:  Do not have high heels.  Have rubber bottoms.  Are comfortable and fit you well.  Are closed at the toe. Do not wear sandals.  If you use a stepladder:  Make sure that it is fully opened. Do not climb a closed stepladder.  Make sure that both sides of the stepladder are locked into place.  Ask someone to hold it for you, if possible.  Clearly mark and make sure that you can see:  Any grab bars or handrails.  First and last steps.  Where the edge of each step is.  Use tools that help you move around (mobility aids) if they are needed. These include:  Canes.  Walkers.  Scooters.  Crutches.  Turn on the lights when you go into a dark area. Replace any light bulbs as soon as they burn out.  Set up your furniture so you have a clear path. Avoid moving your furniture around.  If any of your floors are uneven, fix them.  If there are any pets around you, be aware of where they are.  Review your medicines with your doctor. Some medicines can make you feel dizzy. This can increase your chance of falling. Ask your doctor what other things that you can do to help prevent falls. This information is not intended to replace advice given to you by your health care provider. Make sure you discuss any questions you have with your health care provider. Document Released: 06/17/2009 Document Revised: 01/27/2016 Document Reviewed: 09/25/2014 Elsevier Interactive Patient Education  2017 [employer].

## 2018-06-19 NOTE — Progress Notes (Signed)
Subjective:   Yvette Jenkins is a 62 y.o. female who presents for Medicare Annual (Subsequent) preventive examination.  Review of Systems:  N/A Cardiac Risk Factors include: hypertension;Other (see comment), Risk factor comments: patient just had a kidney transplant     Objective:     Vitals: BP 118/78 (BP Location: Left Arm)   Pulse 96   Temp 98.4 F (36.9 C)   Ht 5\' 1"  (1.549 m)   Wt 138 lb (62.6 kg)   SpO2 99%   BMI 26.07 kg/m   Body mass index is 26.07 kg/m.  Advanced Directives 06/19/2018 07/11/2016 06/24/2015 06/27/2014  Does Patient Have a Medical Advance Directive? Yes No No No  Type of Advance Directive [place]  Does patient want to make changes to medical advance directive? No - Patient declined - - -  Copy of [place] in Chart? No - copy requested - - -  Would patient like information on creating a medical advance directive? - No - patient declined information No - patient declined information No - patient declined information    Tobacco Social History   Tobacco Use  Smoking Status Never Smoker  Smokeless Tobacco Never Used     Counseling given: Not Answered   Clinical Intake:  Pre-visit preparation completed: Yes  Pain : No/denies pain Pain Score: 0-No pain     Nutritional Risks: Nausea/ vomitting/ diarrhea(Just had kidney transplant 05/13/2018) Diabetes: No  How often do you need to have someone help you when you read instructions, pamphlets, or other written materials from your doctor or pharmacy?: 1 - Never What is the last grade level you completed in school?: [employer]'s Degree  Interpreter Needed?: No  Information entered by :: NAllen LPN  Past Medical History:  Diagnosis Date  . Acquired renal cyst of right kidney   . Anemia associated with chronic renal failure    IV Iron infusion   . Arthritis   . Coronary artery disease    per pt cardiac cath at [facility] 10-26-2015  non-obstructive cad  of one vessel  . End-stage renal disease on peritoneal dialysis [facility])    nephrologist-  dr Justin Mend [facility])  daily dialysis start 7-8pm to 7-8am-- currently on kidney transplant list [facility]  . Gross hematuria   . History of Graves' disease   . Hypertension   . Hypothyroidism, postradioiodine therapy   . Kidney transplant candidate    on waiting list [facility]  . Left nephrolithiasis    non-obstructive per pt   . PONV (postoperative nausea and vomiting)   . Secondary hyperparathyroidism of renal origin [facility])    Past Surgical History:  Procedure Laterality Date  . CARDIAC CATHETERIZATION  11-17-2015   [facility]- dr Marcello Moores povsic   abnormal stress test w/ ischemia (received from nephrologist dr webb office) non-obstrucitve CAD- diffuse mid to distal LAD 60% and insignificant RCA,  stress test does not really match up with this lesion and this lesion is likely to remain insignificant  . CARDIOVASCULAR STRESS TEST  10/15/2015   Intermediate risk nuclear study w/ moderate size and intensity, basal to mid inferior wall reversible perfusion defect suggestive of ischemia/  ef 57%,  inferior and inferoseptal hypokinesis to dyskinesis  . [place]  . CYSTOSCOPY W/ RETROGRADES Bilateral 07/11/2016   Procedure: CYSTOSCOPY WITH RETROGRADE PYELOGRAM;  Surgeon: Nickie Retort, MD;  Location: [facility];  Service: Urology;  Laterality:  Bilateral;  . CYSTOSCOPY WITH BIOPSY Left 07/11/2016   Procedure: CYSTOSCOPY WITH  LEFT RENAL PELVIS BIOPSY;  Surgeon: Nickie Retort, MD;  Location[facility];  Service: Urology;  Laterality: Left;  . CYSTOSCOPY WITH STENT PLACEMENT Left 07/11/2016   Procedure: CYSTOSCOPY WITH STENT PLACEMENT;  Surgeon: Nickie Retort, MD;  Location: [facility];  Service: Urology;  Laterality: Left;  . CYSTOSCOPY WITH URETEROSCOPY Left 07/11/2016   Procedure: CYSTOSCOPY WITH URETEROSCOPY;  Surgeon: Nickie Retort, MD;  Location: [facility];  Service: Urology;  Laterality: Left;  . INSERTION OF DIALYSIS CATHETER  06-23-2015  at [facility]   Tenckhoff coiled peritoneal catheter  . 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 TRANSTHORACIC ECHOCARDIOGRAM  10/15/2015   poor acoustic windows limit study-  ef 50% with akinesis of the basal posterior, inferior , and lateral walls,  grade 1 diastolic dysfunction/;  . WRIST ARTHROSCOPY W/ TRIANGULAR FIBROCARTILAGE REPAIR Right 03/10/2010   Family History  Problem Relation Age of Onset  . Hypertension Other   . Diabetes Other    Social History   Socioeconomic History  . Marital status: Married    Spouse name: Not on file  . Number of children: Not on file  . Years of education: Not on file  . Highest education level: Not on file  Occupational History  . Occupation: Child care  Social Needs  . Financial resource strain: Not hard at all  . Food insecurity:    Worry: Never true    Inability: Never true  . Transportation needs:    Medical: No    Non-medical: No  Tobacco Use  . Smoking status: Never Smoker  . Smokeless tobacco: Never Used  Substance and Sexual Activity  . Alcohol use: No  . Drug use: No  . Sexual activity: Not Currently  Lifestyle  . Physical activity:    Days per week: 7 days    Minutes per session: 20 min  . Stress: Not at all  Relationships  . Social connections:    Talks on phone: Not on file    Gets together: Not on file    Attends religious service: Not on file    Active member of club or organization: Not on file    Attends meetings of clubs or organizations: Not on file    Relationship status: Not on file  Other Topics Concern  . Not on file  Social History Narrative  . Not on file    Outpatient Encounter Medications as of 06/19/2018  Medication Sig  . acetaminophen (TYLENOL) 500 MG tablet Take 500 mg by mouth every 8 (eight) hours as needed.  Marland Kitchen  amLODipine (NORVASC) 5 MG tablet Take 5 mg by mouth every evening.   Marland Kitchen levothyroxine (SYNTHROID, LEVOTHROID) 150 MCG tablet Take 150 mcg by mouth daily before breakfast.   . mycophenolate (CELLCEPT) 250 MG capsule Take 1,000 mg by mouth 2 (two) times daily.  . predniSONE (DELTASONE) 5 MG tablet Take 15 mg by mouth daily with breakfast.  . ranitidine (ZANTAC) 150 MG capsule Take 150 mg by mouth every evening.  . senna-docusate (SENOKOT-S) 8.6-50 MG tablet Take 2 tablets by mouth 2 (two) times daily.  Marland Kitchen sulfamethoxazole-trimethoprim (BACTRIM DS,SEPTRA DS) 800-160 MG tablet Take 1 tablet by mouth 3 (three) times a week.  . tacrolimus (PROGRAF) 5 MG capsule Take 5 mg by mouth 2 (two) times daily.  . valGANciclovir (VALCYTE) 450  MG tablet Take 450 mg by mouth daily.  Marland Kitchen b complex-vitamin c-folic acid (NEPHRO-VITE) 0.8 MG TABS tablet Take 1 tablet by mouth at bedtime.  . Cinacalcet HCl (SENSIPAR PO) Take 1 tablet by mouth daily with lunch.  . ferric citrate (AURYXIA) 1 GM 210 MG(Fe) tablet Take 210 mg by mouth 4 (four) times daily.  Marland Kitchen HYDROcodone-acetaminophen (NORCO/VICODIN) 5-325 MG tablet Take 1-2 tablets by mouth every 4 (four) hours as needed for moderate pain or severe pain. (Patient not taking: Reported on 06/19/2018)  . Multiple Vitamins-Minerals (RENAPLEX-D) TABS Take 1 tablet by mouth daily.  . sevelamer (RENAGEL) 800 MG tablet Take 800 mg by mouth 3 (three) times daily with meals.  . Wheat Dextrin (BENEFIBER) POWD Take by mouth daily.   No facility-administered encounter medications on file as of 06/19/2018.     Activities of Daily Living In your present state of health, do you have any difficulty performing the following activities: 06/19/2018  Hearing? N  Vision? N  Difficulty concentrating or making decisions? N  Walking or climbing stairs? N  Dressing or bathing? N  Doing errands, shopping? N  Preparing Food and eating ? N  Using the Toilet? N  In the past six months, have you  accidently leaked urine? N  Do you have problems with loss of bowel control? N  Managing your Medications? N  Managing your Finances? N  Housekeeping or managing your Housekeeping? N  Some recent data might be hidden    Patient Care Team: Glendale Chard, MD as PCP - General (Internal Medicine) [place], Heather, [place] (Optometry) Edrick Oh, MD as Consulting Physician (Nephrology)    Assessment:   This is a routine wellness examination for Dawsyn.  Exercise Activities and Dietary recommendations Current Exercise Habits: Home exercise routine, Time (Minutes): 15, Frequency (Times/Week): 7, Weekly Exercise (Minutes/Week): 105, Intensity: Mild, Exercise limited by: None identified  Goals    . Planning to write a book (pt-stated)       Fall Risk Fall Risk  06/19/2018  Falls in the past year? No  Risk for fall due to : Medication side effect   Is the patient's home free of loose throw rugs in walkways, pet beds, electrical cords, etc?   yes      Grab bars in the bathroom? no      Handrails on the stairs?   yes      Adequate lighting?   yes  Timed Get Up and Go performed: N/A  Depression Screen PHQ 2/9 Scores 06/19/2018  PHQ - 2 Score 0     Cognitive Function     6CIT Screen 06/19/2018  What Year? 0 points  What month? 0 points  What time? 0 points  Count back from 20 0 points  Months in reverse 0 points  Repeat phrase 0 points  Total Score 0     There is no immunization history on file for this patient.  Qualifies for Shingles Vaccine? no  Screening Tests Health Maintenance  Topic Date Due  . Hepatitis C Screening  04-28-1956  . HIV Screening  08/27/1971  . PAP SMEAR  06/09/2018  . INFLUENZA VACCINE  07/20/2018 (Originally 04/04/2018)  . MAMMOGRAM  09/01/2019  . COLONOSCOPY  05/05/2021  . TETANUS/TDAP  08/07/2023    Cancer Screenings: Lung: Low Dose CT Chest recommended if Age 61-80 years, 30 pack-year currently smoking OR have quit w/in 15years. Patient  does not qualify. Breast:  Up to date on Mammogram? Yes   Up to  date of Bone Density/Dexa? Yes Colorectal: up to date  Additional Screenings: : Hepatitis C Screening: due     Plan:    Patient has had a renal transplant on 05/13/2018.  All vaccinations are on hold until further notice.   I have personally reviewed and noted the following in the patient's chart:   . Medical and social history . Use of alcohol, tobacco or illicit drugs  . Current medications and supplements . Functional ability and status . Nutritional status . Physical activity . Advanced directives . List of other physicians . Hospitalizations, surgeries, and ER visits in previous 12 months . Vitals . Screenings to include cognitive, depression, and falls . Referrals and appointments  In addition, I have reviewed and discussed with patient certain preventive protocols, quality metrics, and best practice recommendations. A written personalized care plan for preventive services as well as general preventive health recommendations were provided to patient.     Kellie Simmering, LPN  [id number]

## 2018-06-19 NOTE — Progress Notes (Signed)
Subjective:     Patient ID: Yvette Jenkins , female    DOB: Oct 25, 1955 , 62 y.o.   MRN: [id number]   Thyroid Problem  Presents for follow-up visit. Symptoms include diarrhea and fatigue. Patient reports no constipation or palpitations.     Has GYN at [place] - scheduled for mammogram and PAP in December   The patient states she uses post menopausal status for birth control. Last LMP was No LMP recorded. Patient is postmenopausal.. Negative for Dysmenorrhea and Negative for Menorrhagia @MAMMOFINDINGS @. Negative for: breast discharge, breast lump(s), breast pain and breast self exam. Associated symptoms include abnormal vaginal bleeding. Pertinent negatives include abnormal bleeding (hematology), anxiety, decreased libido, depression, difficulty falling sleep, dyspareunia, history of infertility, nocturia, sexual dysfunction, sleep disturbances, urinary incontinence, urinary urgency, vaginal discharge and vaginal itching. Diet regular.The patient states her exercise level is   none currently due to recent kidney transplant   . The patient's tobacco use is:  Social History   Tobacco Use  Smoking Status Never Smoker  Smokeless Tobacco Never Used  . She has been exposed to passive smoke. The patient's alcohol use is:  Social History   Substance and Sexual Activity  Alcohol Use No  . Additional information: Last pap followed by GYN .  Past Medical History:  Diagnosis Date  . Acquired renal cyst of right kidney   . Anemia associated with chronic renal failure    IV Iron infusion   . Arthritis   . Coronary artery disease    per pt cardiac cath at [facility] 10-26-2015  non-obstructive cad of one vessel  . End-stage renal disease on peritoneal dialysis [facility])    nephrologist-  dr Justin Mend [facility])  daily dialysis start 7-8pm to 7-8am-- currently on kidney transplant list at [facility]  . Gross hematuria   . History of Graves' disease   . Hypertension   . Hypothyroidism,  postradioiodine therapy   . Kidney transplant candidate    on waiting list at [facility]  . Left nephrolithiasis    non-obstructive per pt   . PONV (postoperative nausea and vomiting)   . Secondary hyperparathyroidism of renal origin [facility])       Current Outpatient Medications:  .  acetaminophen (TYLENOL) 500 MG tablet, Take 500 mg by mouth every 8 (eight) hours as needed., Disp: , Rfl:  .  amLODipine (NORVASC) 5 MG tablet, Take 5 mg by mouth every evening. , Disp: , Rfl:  .  b complex-vitamin c-folic acid (NEPHRO-VITE) 0.8 MG TABS tablet, Take 1 tablet by mouth at bedtime., Disp: , Rfl:  .  Cinacalcet HCl (SENSIPAR PO), Take 1 tablet by mouth daily with lunch., Disp: , Rfl:  .  ferric citrate (AURYXIA) 1 GM 210 MG(Fe) tablet, Take 210 mg by mouth 4 (four) times daily., Disp: , Rfl:  .  HYDROcodone-acetaminophen (NORCO/VICODIN) 5-325 MG tablet, Take 1-2 tablets by mouth every 4 (four) hours as needed for moderate pain or severe pain. (Patient not taking: Reported on 06/19/2018), Disp: 12 tablet, Rfl: 0 .  levothyroxine (SYNTHROID, LEVOTHROID) 150 MCG tablet, Take 150 mcg by mouth daily before breakfast. , Disp: , Rfl:  .  Multiple Vitamins-Minerals (RENAPLEX-D) TABS, Take 1 tablet by mouth daily., Disp: , Rfl:  .  mycophenolate (CELLCEPT) 250 MG capsule, Take 1,000 mg by mouth 2 (two) times daily., Disp: , Rfl:  .  predniSONE (DELTASONE) 5 MG tablet, Take 15 mg by mouth daily with breakfast., Disp: , Rfl:  .  ranitidine (  ZANTAC) 150 MG capsule, Take 150 mg by mouth every evening., Disp: , Rfl:  .  senna-docusate (SENOKOT-S) 8.6-50 MG tablet, Take 2 tablets by mouth 2 (two) times daily., Disp: , Rfl:  .  sevelamer (RENAGEL) 800 MG tablet, Take 800 mg by mouth 3 (three) times daily with meals., Disp: , Rfl:  .  sulfamethoxazole-trimethoprim (BACTRIM DS,SEPTRA DS) 800-160 MG tablet, Take 1 tablet by mouth 3 (three) times a week., Disp: , Rfl:  .  tacrolimus (PROGRAF) 5 MG capsule, Take 5 mg by mouth 2  (two) times daily., Disp: , Rfl:  .  valGANciclovir (VALCYTE) 450 MG tablet, Take 450 mg by mouth daily., Disp: , Rfl:  .  Wheat Dextrin (BENEFIBER) POWD, Take by mouth daily., Disp: , Rfl:    Allergies  Allergen Reactions  . Aspirin Hives  . Tetracyclines & Related Hives and Other (See Comments)    GI intolerance -"Messes with stomach"     Review of Systems  Constitutional: Positive for fatigue.  HENT: Negative.   Eyes: Negative.   Respiratory: Negative.   Cardiovascular: Negative.  Negative for palpitations.  Gastrointestinal: Positive for diarrhea. Negative for constipation.  Endocrine: Negative.   Genitourinary: Negative.   Musculoskeletal: Negative.   Skin: Negative.   Allergic/Immunologic: Negative.   Neurological: Negative.   Hematological: Negative.   Psychiatric/Behavioral: Negative.      There were no vitals filed for this visit. There is no height or weight on file to calculate BMI.   Objective:  Physical Exam  Constitutional: She is oriented to person, place, and time. She appears well-developed and well-nourished.  HENT:  Head: Normocephalic.  Right Ear: External ear normal.  Left Ear: External ear normal.  Nose: Nose normal.  Mouth/Throat: Oropharynx is clear and moist.  Eyes: Pupils are equal, round, and reactive to light. Conjunctivae and EOM are normal.  Neck: Normal range of motion. Neck supple.  Cardiovascular: Normal rate, regular rhythm and normal heart sounds.  Pulmonary/Chest: Effort normal and breath sounds normal.  Abdominal: Soft. Bowel sounds are normal.  Musculoskeletal: Normal range of motion.  Neurological: She is alert and oriented to person, place, and time.  Skin: Skin is warm and dry. Capillary refill takes less than 2 seconds.  Psychiatric: She has a normal mood and affect.        Assessment And Plan:     1. Essential hypertension  Chronic, controlled  Continue with current medications  2. Acquired  hypothyroidism  Chronic, controlled  Continue with current medications - TSH - T3, free - T4  3. Vitamin D deficiency  Chronic, controlled  Continue with current medications  4. Kidney transplant recipient  Recent kidney transplant approximately 3 weeks ago doing well.   She is being followed by [facility]       Minette Brine, [place]

## 2018-06-20 ENCOUNTER — Other Ambulatory Visit: Payer: Self-pay | Admitting: Nurse Practitioner

## 2018-06-20 DIAGNOSIS — Z7952 Long term (current) use of systemic steroids: Secondary | ICD-10-CM | POA: Diagnosis not present

## 2018-06-20 DIAGNOSIS — D649 Anemia, unspecified: Secondary | ICD-10-CM | POA: Diagnosis not present

## 2018-06-20 DIAGNOSIS — Z792 Long term (current) use of antibiotics: Secondary | ICD-10-CM | POA: Diagnosis not present

## 2018-06-20 DIAGNOSIS — N2581 Secondary hyperparathyroidism of renal origin: Secondary | ICD-10-CM | POA: Diagnosis not present

## 2018-06-20 DIAGNOSIS — E0580 Other thyrotoxicosis without thyrotoxic crisis or storm: Secondary | ICD-10-CM | POA: Diagnosis not present

## 2018-06-20 DIAGNOSIS — Z940 Kidney transplant status: Secondary | ICD-10-CM | POA: Diagnosis not present

## 2018-06-20 DIAGNOSIS — Z452 Encounter for adjustment and management of vascular access device: Secondary | ICD-10-CM | POA: Diagnosis not present

## 2018-06-20 DIAGNOSIS — Z79899 Other long term (current) drug therapy: Secondary | ICD-10-CM | POA: Diagnosis not present

## 2018-06-20 DIAGNOSIS — Z881 Allergy status to other antibiotic agents status: Secondary | ICD-10-CM | POA: Diagnosis not present

## 2018-06-20 DIAGNOSIS — D573 Sickle-cell trait: Secondary | ICD-10-CM | POA: Diagnosis not present

## 2018-06-20 DIAGNOSIS — Z79890 Hormone replacement therapy (postmenopausal): Secondary | ICD-10-CM | POA: Diagnosis not present

## 2018-06-20 DIAGNOSIS — E039 Hypothyroidism, unspecified: Secondary | ICD-10-CM

## 2018-06-20 DIAGNOSIS — I10 Essential (primary) hypertension: Secondary | ICD-10-CM | POA: Diagnosis not present

## 2018-06-20 DIAGNOSIS — R739 Hyperglycemia, unspecified: Secondary | ICD-10-CM | POA: Diagnosis not present

## 2018-06-20 DIAGNOSIS — Z886 Allergy status to analgesic agent status: Secondary | ICD-10-CM | POA: Diagnosis not present

## 2018-06-20 DIAGNOSIS — Z4902 Encounter for fitting and adjustment of peritoneal dialysis catheter: Secondary | ICD-10-CM | POA: Diagnosis not present

## 2018-06-20 DIAGNOSIS — K219 Gastro-esophageal reflux disease without esophagitis: Secondary | ICD-10-CM | POA: Diagnosis not present

## 2018-06-20 LAB — T4: T4 TOTAL: 7.8 ug/dL (ref 4.5–12.0)

## 2018-06-20 LAB — TSH: TSH: 0.028 u[IU]/mL — ABNORMAL LOW (ref 0.450–4.500)

## 2018-06-20 LAB — T3, FREE: T3 FREE: 2.7 pg/mL (ref 2.0–4.4)

## 2018-06-27 DIAGNOSIS — Z940 Kidney transplant status: Secondary | ICD-10-CM | POA: Diagnosis not present

## 2018-06-27 DIAGNOSIS — Z5181 Encounter for therapeutic drug level monitoring: Secondary | ICD-10-CM | POA: Diagnosis not present

## 2018-06-27 DIAGNOSIS — R197 Diarrhea, unspecified: Secondary | ICD-10-CM | POA: Diagnosis not present

## 2018-06-28 DIAGNOSIS — B259 Cytomegaloviral disease, unspecified: Secondary | ICD-10-CM | POA: Diagnosis not present

## 2018-06-28 DIAGNOSIS — Z940 Kidney transplant status: Secondary | ICD-10-CM | POA: Diagnosis not present

## 2018-06-28 DIAGNOSIS — Z5181 Encounter for therapeutic drug level monitoring: Secondary | ICD-10-CM | POA: Diagnosis not present

## 2018-06-28 DIAGNOSIS — B349 Viral infection, unspecified: Secondary | ICD-10-CM | POA: Diagnosis not present

## 2018-07-04 ENCOUNTER — Ambulatory Visit: Payer: Self-pay | Admitting: Internal Medicine

## 2018-07-04 ENCOUNTER — Ambulatory Visit: Payer: Self-pay

## 2018-07-04 DIAGNOSIS — Z23 Encounter for immunization: Secondary | ICD-10-CM | POA: Diagnosis not present

## 2018-07-04 DIAGNOSIS — D899 Disorder involving the immune mechanism, unspecified: Secondary | ICD-10-CM | POA: Diagnosis not present

## 2018-07-04 DIAGNOSIS — Z940 Kidney transplant status: Secondary | ICD-10-CM | POA: Diagnosis not present

## 2018-08-04 ENCOUNTER — Other Ambulatory Visit: Payer: Self-pay | Admitting: Internal Medicine

## 2018-08-05 ENCOUNTER — Other Ambulatory Visit: Payer: Self-pay | Admitting: Nurse Practitioner

## 2018-08-06 DIAGNOSIS — Z940 Kidney transplant status: Secondary | ICD-10-CM | POA: Diagnosis not present

## 2018-08-06 DIAGNOSIS — B349 Viral infection, unspecified: Secondary | ICD-10-CM | POA: Diagnosis not present

## 2018-08-06 DIAGNOSIS — Z5181 Encounter for therapeutic drug level monitoring: Secondary | ICD-10-CM | POA: Diagnosis not present

## 2018-08-06 DIAGNOSIS — B259 Cytomegaloviral disease, unspecified: Secondary | ICD-10-CM | POA: Diagnosis not present

## 2018-08-15 DIAGNOSIS — J9811 Atelectasis: Secondary | ICD-10-CM | POA: Diagnosis not present

## 2018-08-15 DIAGNOSIS — Z4822 Encounter for aftercare following kidney transplant: Secondary | ICD-10-CM | POA: Diagnosis not present

## 2018-08-15 DIAGNOSIS — R0981 Nasal congestion: Secondary | ICD-10-CM | POA: Diagnosis not present

## 2018-08-15 DIAGNOSIS — R197 Diarrhea, unspecified: Secondary | ICD-10-CM | POA: Diagnosis not present

## 2018-08-15 DIAGNOSIS — Z940 Kidney transplant status: Secondary | ICD-10-CM | POA: Diagnosis not present

## 2018-08-15 DIAGNOSIS — J986 Disorders of diaphragm: Secondary | ICD-10-CM | POA: Diagnosis not present

## 2018-08-15 DIAGNOSIS — R918 Other nonspecific abnormal finding of lung field: Secondary | ICD-10-CM | POA: Diagnosis not present

## 2018-08-15 DIAGNOSIS — R300 Dysuria: Secondary | ICD-10-CM | POA: Diagnosis not present

## 2018-08-15 DIAGNOSIS — Z79899 Other long term (current) drug therapy: Secondary | ICD-10-CM | POA: Diagnosis not present

## 2018-08-15 DIAGNOSIS — R05 Cough: Secondary | ICD-10-CM | POA: Diagnosis not present

## 2018-08-21 ENCOUNTER — Ambulatory Visit (INDEPENDENT_AMBULATORY_CARE_PROVIDER_SITE_OTHER): Payer: MEDICARE | Admitting: Internal Medicine

## 2018-08-21 ENCOUNTER — Other Ambulatory Visit: Payer: Self-pay

## 2018-08-21 ENCOUNTER — Encounter: Payer: Self-pay | Admitting: Internal Medicine

## 2018-08-21 VITALS — BP 138/92 | HR 92 | Temp 97.9°F | Ht 61.0 in | Wt 144.8 lb

## 2018-08-21 DIAGNOSIS — N183 Chronic kidney disease, stage 3 unspecified: Secondary | ICD-10-CM

## 2018-08-21 DIAGNOSIS — E039 Hypothyroidism, unspecified: Secondary | ICD-10-CM | POA: Diagnosis not present

## 2018-08-21 DIAGNOSIS — I129 Hypertensive chronic kidney disease with stage 1 through stage 4 chronic kidney disease, or unspecified chronic kidney disease: Secondary | ICD-10-CM

## 2018-08-21 DIAGNOSIS — Z940 Kidney transplant status: Secondary | ICD-10-CM

## 2018-08-21 MED ORDER — LEVOTHYROXINE SODIUM 150 MCG PO TABS: ORAL_TABLET | ORAL | 1 refills | Status: DC

## 2018-08-21 NOTE — Patient Instructions (Signed)
Hypothyroidism  Hypothyroidism is when the thyroid gland does not make enough of certain hormones (it is underactive). The thyroid gland is a small gland located in the lower front part of the neck, just in front of the windpipe (trachea). This gland makes hormones that help control how the body uses food for energy (metabolism) as well as how the heart and brain function. These hormones also play a role in keeping your bones strong. When the thyroid is underactive, it produces too little of the hormones thyroxine (T4) and triiodothyronine (T3). What are the causes? This condition may be caused by:  Hashimoto's disease. This is a disease in which the body's disease-fighting system (immune system) attacks the thyroid gland. This is the most common cause.  Viral infections.  Pregnancy.  Certain medicines.  Birth defects.  Past radiation treatments to the head or neck for cancer.  Past treatment with radioactive iodine.  Past exposure to radiation in the environment.  Past surgical removal of part or all of the thyroid.  Problems with a gland in the center of the brain (pituitary gland).  Lack of enough iodine in the diet. What increases the risk? You are more likely to develop this condition if:  You are female.  You have a family history of thyroid conditions.  You use a medicine called lithium.  You take medicines that affect the immune system (immunosuppressants). What are the signs or symptoms? Symptoms of this condition include:  Feeling as though you have no energy (lethargy).  Not being able to tolerate cold.  Weight gain that is not explained by a change in diet or exercise habits.  Lack of appetite.  Dry skin.  Coarse hair.  Menstrual irregularity.  Slowing of thought processes.  Constipation.  Sadness or depression. How is this diagnosed? This condition may be diagnosed based on:  Your symptoms, your medical history, and a physical exam.  Blood  tests. You may also have imaging tests, such as an ultrasound or MRI. How is this treated? This condition is treated with medicine that replaces the thyroid hormones that your body does not make. After you begin treatment, it may take several weeks for symptoms to go away. Follow these instructions at home:  Take over-the-counter and prescription medicines only as told by your health care provider.  If you start taking any new medicines, tell your health care provider.  Keep all follow-up visits as told by your health care provider. This is important. ? As your condition improves, your dosage of thyroid hormone medicine may change. ? You will need to have blood tests regularly so that your health care provider can monitor your condition. Contact a health care provider if:  Your symptoms do not get better with treatment.  You are taking thyroid replacement medicine and you: ? Sweat a lot. ? Have tremors. ? Feel anxious. ? Lose weight rapidly. ? Cannot tolerate heat. ? Have emotional swings. ? Have diarrhea. ? Feel weak. Get help right away if you have:  Chest pain.  An irregular heartbeat.  A rapid heartbeat.  Difficulty breathing. Summary  Hypothyroidism is when the thyroid gland does not make enough of certain hormones (it is underactive).  When the thyroid is underactive, it produces too little of the hormones thyroxine (T4) and triiodothyronine (T3).  The most common cause is Hashimoto's disease, a disease in which the body's disease-fighting system (immune system) attacks the thyroid gland. The condition can also be caused by viral infections, medicine, pregnancy, or past   radiation treatment to the head or neck.  Symptoms may include weight gain, dry skin, constipation, feeling as though you do not have energy, and not being able to tolerate cold.  This condition is treated with medicine to replace the thyroid hormones that your body does not make. This information  is not intended to replace advice given to you by your health care provider. Make sure you discuss any questions you have with your health care provider. Document Released: 08/21/2005 Document Revised: 08/01/2017 Document Reviewed: 08/01/2017 Elsevier Interactive Patient Education  2019 Elsevier Inc.  

## 2018-08-22 ENCOUNTER — Ambulatory Visit: Payer: Self-pay | Admitting: Internal Medicine

## 2018-08-22 ENCOUNTER — Ambulatory Visit: Payer: MEDICARE | Admitting: Internal Medicine

## 2018-08-22 LAB — TSH: TSH: 0.012 u[IU]/mL — ABNORMAL LOW (ref 0.450–4.500)

## 2018-08-22 LAB — T4, FREE: FREE T4: 1.69 ng/dL (ref 0.82–1.77)

## 2018-08-23 DIAGNOSIS — B349 Viral infection, unspecified: Secondary | ICD-10-CM | POA: Diagnosis not present

## 2018-08-23 DIAGNOSIS — B259 Cytomegaloviral disease, unspecified: Secondary | ICD-10-CM | POA: Diagnosis not present

## 2018-08-23 DIAGNOSIS — Z940 Kidney transplant status: Secondary | ICD-10-CM | POA: Diagnosis not present

## 2018-08-23 DIAGNOSIS — Z5181 Encounter for therapeutic drug level monitoring: Secondary | ICD-10-CM | POA: Diagnosis not present

## 2018-09-01 NOTE — Progress Notes (Signed)
Your tsh is still low. pls confirm dose of thyroid meds she is taking.

## 2018-09-02 ENCOUNTER — Encounter: Payer: Self-pay | Admitting: Internal Medicine

## 2018-09-02 NOTE — Progress Notes (Signed)
Subjective:     Patient ID: Yvette Jenkins , female    DOB: [birth year] , 62 y.o.   MRN: [id number]   Chief Complaint  Patient presents with  . Thyroid Problem  . Hypertension    HPI  Thyroid Problem  Presents for follow-up visit. Patient reports no constipation, depressed mood, diaphoresis, heat intolerance or palpitations. The symptoms have been stable.  Hypertension  This is a chronic problem. The current episode started more than 1 year ago. The problem has been gradually improving since onset. The problem is controlled. Pertinent negatives include no blurred vision, chest pain, palpitations or shortness of breath. Identifiable causes of hypertension include a thyroid problem.   She reports compliance with meds.   Past Medical History:  Diagnosis Date  . Acquired renal cyst of right kidney   . Anemia associated with chronic renal failure    IV Iron infusion   . Arthritis   . Coronary artery disease    per pt cardiac cath at [facility] 10-26-2015  non-obstructive cad of one vessel  . End-stage renal disease on peritoneal dialysis [facility])    nephrologist-  dr Justin [facility])  daily dialysis start 7-8pm to 7-8am-- currently on kidney transplant list at [facility]  . Gross hematuria   . History of Graves' disease   . Hypertension   . Hypothyroidism, postradioiodine therapy   . Kidney transplant candidate    on waiting list at [facility]  . Left nephrolithiasis    non-obstructive per pt   . PONV (postoperative nausea and vomiting)   . Secondary hyperparathyroidism of renal origin [facility])      Family History  Problem Relation Age of Onset  . Hypertension Other   . Diabetes Other   . Renal Disease Mother   . Heart attack Father      Current Outpatient Medications:  .  acetaminophen (TYLENOL) 500 MG tablet, Take 500 mg by mouth every 8 (eight) hours as needed., Disp: , Rfl:  .  amLODipine (NORVASC) 10 MG tablet, Take 10 mg by mouth daily., Disp: , Rfl:  .  levothyroxine  (SYNTHROID, LEVOTHROID) 150 MCG tablet, One tablet Monday- Saturday, Disp: 30 tablet, Rfl: 1 .  mycophenolate (CELLCEPT) 250 MG capsule, Take 1,000 mg by mouth 2 (two) times daily., Disp: , Rfl:  .  predniSONE (DELTASONE) 5 MG tablet, Take 15 mg by mouth daily with breakfast., Disp: , Rfl:  .  ranitidine (ZANTAC) 150 MG capsule, Take 150 mg by mouth every evening., Disp: , Rfl:  .  senna-docusate (SENOKOT-S) 8.6-50 MG tablet, Take 2 tablets by mouth 2 (two) times daily., Disp: , Rfl:  .  tacrolimus (PROGRAF) 5 MG capsule, Take 5 mg by mouth 2 (two) times daily., Disp: , Rfl:  .  valGANciclovir (VALCYTE) 450 MG tablet, Take 450 mg by mouth daily., Disp: , Rfl:  .  sevelamer (RENAGEL) 800 MG tablet, Take 800 mg by mouth 3 (three) times daily with meals., Disp: , Rfl:  .  sulfamethoxazole-trimethoprim (BACTRIM DS,SEPTRA DS) 800-160 MG tablet, Take 1 tablet by mouth 3 (three) times a week., Disp: , Rfl:  .  Wheat Dextrin (BENEFIBER) POWD, Take by mouth daily., Disp: , Rfl:    Allergies  Allergen Reactions  . Aspirin Hives  . Tetracyclines & Related Hives and Other (See Comments)    GI intolerance -"Messes with stomach"     Review of Systems  Constitutional: Negative.  Negative for diaphoresis.  Eyes: Negative for blurred vision.  Respiratory: Negative.  Negative for shortness of breath.   Cardiovascular: Negative.  Negative for chest pain and palpitations.  Gastrointestinal: Negative.  Negative for constipation.  Endocrine: Negative for heat intolerance.  Neurological: Negative.   Psychiatric/Behavioral: Negative.      Today's Vitals   08/21/18 1616  BP: (!) 138/92  Pulse: 92  Temp: 97.9 F (36.6 C)  Weight: 144 lb 12.8 oz (65.7 kg)  Height: 5\' 1"  (1.549 m)   Body mass index is 27.36 kg/m.   Objective:  Physical Exam Vitals signs and nursing note reviewed.  Constitutional:      Appearance: Normal appearance.  Neck:     Musculoskeletal: Normal range of motion.   Cardiovascular:     Rate and Rhythm: Normal rate and regular rhythm.     Heart sounds: Normal heart sounds.  Pulmonary:     Effort: Pulmonary effort is normal.     Breath sounds: Normal breath sounds.  Skin:    General: Skin is warm.  Neurological:     General: No focal deficit present.     Mental Status: She is alert.  Psychiatric:        Mood and Affect: Mood normal.         Assessment And Plan:     1. Primary hypothyroidism  I will check a thyroid panel and adjust meds as needed.  Importance of medication compliance was discussed with the patient. She will rto in four months for re-evaluation.   - TSH - T4, Free  2. Hypertensive nephropathy  Fair control. She will continue with current meds for now. She is encouraged to avoid adding salt to her foods.   3. Chronic renal disease, stage III ([place])  She reports she now has stage 3 ckd s/p renal transplant.   4. Kidney transplant recipient   Maximino Greenland, MD

## 2018-09-06 ENCOUNTER — Other Ambulatory Visit: Payer: Self-pay

## 2018-09-07 NOTE — Progress Notes (Signed)
pls be sure she has lab appt in six weeks. She needs to call us when brand samples are running out so we can put aside more for her.

## 2018-09-11 ENCOUNTER — Other Ambulatory Visit: Payer: Self-pay

## 2018-09-20 DIAGNOSIS — Z5181 Encounter for therapeutic drug level monitoring: Secondary | ICD-10-CM | POA: Diagnosis not present

## 2018-09-20 DIAGNOSIS — B259 Cytomegaloviral disease, unspecified: Secondary | ICD-10-CM | POA: Diagnosis not present

## 2018-09-20 DIAGNOSIS — B349 Viral infection, unspecified: Secondary | ICD-10-CM | POA: Diagnosis not present

## 2018-09-20 DIAGNOSIS — Z940 Kidney transplant status: Secondary | ICD-10-CM | POA: Diagnosis not present

## 2018-09-26 DIAGNOSIS — D638 Anemia in other chronic diseases classified elsewhere: Secondary | ICD-10-CM | POA: Diagnosis not present

## 2018-09-26 DIAGNOSIS — Z940 Kidney transplant status: Secondary | ICD-10-CM | POA: Diagnosis not present

## 2018-10-14 ENCOUNTER — Other Ambulatory Visit: Payer: MEDICARE

## 2018-10-14 DIAGNOSIS — E039 Hypothyroidism, unspecified: Secondary | ICD-10-CM | POA: Diagnosis not present

## 2018-10-15 LAB — T3, FREE: T3, Free: 2.3 pg/mL (ref 2.0–4.4)

## 2018-10-15 LAB — T4: T4 TOTAL: 7 ug/dL (ref 4.5–12.0)

## 2018-10-24 DIAGNOSIS — B259 Cytomegaloviral disease, unspecified: Secondary | ICD-10-CM | POA: Diagnosis not present

## 2018-10-24 DIAGNOSIS — Z940 Kidney transplant status: Secondary | ICD-10-CM | POA: Diagnosis not present

## 2018-10-24 DIAGNOSIS — B349 Viral infection, unspecified: Secondary | ICD-10-CM | POA: Diagnosis not present

## 2018-10-24 DIAGNOSIS — Z5181 Encounter for therapeutic drug level monitoring: Secondary | ICD-10-CM | POA: Diagnosis not present

## 2018-10-25 LAB — SPECIMEN STATUS REPORT

## 2018-10-29 DIAGNOSIS — B349 Viral infection, unspecified: Secondary | ICD-10-CM | POA: Diagnosis not present

## 2018-10-29 DIAGNOSIS — Z940 Kidney transplant status: Secondary | ICD-10-CM | POA: Diagnosis not present

## 2018-10-29 DIAGNOSIS — B259 Cytomegaloviral disease, unspecified: Secondary | ICD-10-CM | POA: Diagnosis not present

## 2018-10-29 DIAGNOSIS — Z5181 Encounter for therapeutic drug level monitoring: Secondary | ICD-10-CM | POA: Diagnosis not present

## 2018-11-01 ENCOUNTER — Ambulatory Visit: Payer: MEDICARE | Admitting: Internal Medicine

## 2018-11-12 DIAGNOSIS — B259 Cytomegaloviral disease, unspecified: Secondary | ICD-10-CM | POA: Diagnosis not present

## 2018-11-12 DIAGNOSIS — B349 Viral infection, unspecified: Secondary | ICD-10-CM | POA: Diagnosis not present

## 2018-11-12 DIAGNOSIS — Z940 Kidney transplant status: Secondary | ICD-10-CM | POA: Diagnosis not present

## 2018-11-12 DIAGNOSIS — Z5181 Encounter for therapeutic drug level monitoring: Secondary | ICD-10-CM | POA: Diagnosis not present

## 2018-11-28 DIAGNOSIS — B349 Viral infection, unspecified: Secondary | ICD-10-CM | POA: Diagnosis not present

## 2018-11-28 DIAGNOSIS — Z5181 Encounter for therapeutic drug level monitoring: Secondary | ICD-10-CM | POA: Diagnosis not present

## 2018-11-28 DIAGNOSIS — B259 Cytomegaloviral disease, unspecified: Secondary | ICD-10-CM | POA: Diagnosis not present

## 2018-11-28 DIAGNOSIS — Z940 Kidney transplant status: Secondary | ICD-10-CM | POA: Diagnosis not present

## 2018-12-05 DIAGNOSIS — L299 Pruritus, unspecified: Secondary | ICD-10-CM | POA: Diagnosis not present

## 2018-12-05 DIAGNOSIS — Z792 Long term (current) use of antibiotics: Secondary | ICD-10-CM | POA: Diagnosis not present

## 2018-12-05 DIAGNOSIS — Z940 Kidney transplant status: Secondary | ICD-10-CM | POA: Diagnosis not present

## 2018-12-05 DIAGNOSIS — Z79899 Other long term (current) drug therapy: Secondary | ICD-10-CM | POA: Diagnosis not present

## 2018-12-19 DIAGNOSIS — Z5181 Encounter for therapeutic drug level monitoring: Secondary | ICD-10-CM | POA: Diagnosis not present

## 2018-12-19 DIAGNOSIS — Z940 Kidney transplant status: Secondary | ICD-10-CM | POA: Diagnosis not present

## 2018-12-19 DIAGNOSIS — B259 Cytomegaloviral disease, unspecified: Secondary | ICD-10-CM | POA: Diagnosis not present

## 2018-12-19 DIAGNOSIS — B349 Viral infection, unspecified: Secondary | ICD-10-CM | POA: Diagnosis not present

## 2018-12-25 ENCOUNTER — Other Ambulatory Visit: Payer: Self-pay

## 2018-12-25 MED ORDER — SYNTHROID 150 MCG PO TABS: 150.0000 ug | ORAL_TABLET | Freq: Every day | ORAL | 0 refills | Status: DC

## 2018-12-25 NOTE — Telephone Encounter (Signed)
Patient called requesting samples of synthroid I returned pt call and notified her that we dont have anymore samples of her dose so I will send it to the pharmacy. YRL,RMA

## 2019-01-10 DIAGNOSIS — Z5181 Encounter for therapeutic drug level monitoring: Secondary | ICD-10-CM | POA: Diagnosis not present

## 2019-01-10 DIAGNOSIS — B259 Cytomegaloviral disease, unspecified: Secondary | ICD-10-CM | POA: Diagnosis not present

## 2019-01-10 DIAGNOSIS — Z940 Kidney transplant status: Secondary | ICD-10-CM | POA: Diagnosis not present

## 2019-01-10 DIAGNOSIS — B349 Viral infection, unspecified: Secondary | ICD-10-CM | POA: Diagnosis not present

## 2019-01-20 ENCOUNTER — Ambulatory Visit: Payer: Self-pay

## 2019-01-20 DIAGNOSIS — E039 Hypothyroidism, unspecified: Secondary | ICD-10-CM

## 2019-01-20 DIAGNOSIS — I129 Hypertensive chronic kidney disease with stage 1 through stage 4 chronic kidney disease, or unspecified chronic kidney disease: Secondary | ICD-10-CM

## 2019-01-20 DIAGNOSIS — N183 Chronic kidney disease, stage 3 unspecified: Secondary | ICD-10-CM

## 2019-01-20 NOTE — Chronic Care Management (AMB) (Signed)
  Chronic Care Management   Telephone Outreach Note  01/20/2019 Name: Yvette Jenkins MRN: [id number] DOB: 07/02/1956  Referred by: patient's health plan.   I reached out to Ms. Yvette Jenkins today by phone in response to a referral sent by Ms. Yvette Jenkins's health plan. Ms. Yvette Jenkins and I briefly discussed care management needs related to ESRD  Yvette Jenkins was given information about Chronic Care Management services today including:  1. CCM service includes personalized support from designated clinical staff supervised by her physician, including individualized plan of care and coordination with other care providers 2. 24/7 contact phone numbers for assistance for urgent and routine care needs. 3. Service will only be billed when office clinical staff spend 20 minutes or more in a month to coordinate care. 4. Only one practitioner may furnish and bill the service in a calendar month. 5. The patient may stop CCM services at any time (effective at the end of the month) by phone call to the office staff. 6. The patient will be responsible for cost sharing (co-pay) of up to 20% of the service fee (after annual deductible is met).  Patient did not agree to services and does not wish to consider at this time. The patient is currently receiving care through [place] kidney transplant program and reports having a medical team to assist with all areas the CCM team would assist with.  No further follow up required: The patient was given program information and will contact her primary care provider in the future in enrollment is desired.   Glendale Chard, MD has been notified of this outreach and Ms. Yvette Jenkins decision and plan.   Daneen Schick, BSW, CDP [facility] Care Management Social Worker [phone]  Total time spent performing care coordination and/or care management activities with the patient by phone or face to face = 6 minutes.

## 2019-01-20 NOTE — Patient Instructions (Signed)
Social Worker Visit Information   Ms. Kuwahara was given information about Chronic Care Management services today including:  1. CCM service includes personalized support from designated clinical staff supervised by her physician, including individualized plan of care and coordination with other care providers 2. 24/7 contact phone numbers for assistance for urgent and routine care needs. 3. Service will only be billed when office clinical staff spend 20 minutes or more in a month to coordinate care. 4. Only one practitioner may furnish and bill the service in a calendar month. 5. The patient may stop CCM services at any time (effective at the end of the month) by phone call to the office staff. 6. The patient will be responsible for cost sharing (co-pay) of up to 20% of the service fee (after annual deductible is met).  Patient did not agree to services and does not wish to consider at this time.  The patient verbalized understanding of instructions provided today and declined a print copy of patient instruction materials.   Follow up plan: No further follow up planned.   Daneen Schick, BSW, CDP [facility] Care Management Social Worker [phone]

## 2019-02-19 ENCOUNTER — Telehealth: Payer: Self-pay

## 2019-02-19 NOTE — Telephone Encounter (Signed)
The patient was notified that her lab order slip to take to [facility] to check her thyroid level is ready for pickup.

## 2019-02-20 ENCOUNTER — Other Ambulatory Visit: Payer: Self-pay | Admitting: Internal Medicine

## 2019-02-20 DIAGNOSIS — Z940 Kidney transplant status: Secondary | ICD-10-CM | POA: Diagnosis not present

## 2019-02-20 DIAGNOSIS — Z5181 Encounter for therapeutic drug level monitoring: Secondary | ICD-10-CM | POA: Diagnosis not present

## 2019-02-20 DIAGNOSIS — E039 Hypothyroidism, unspecified: Secondary | ICD-10-CM | POA: Diagnosis not present

## 2019-02-20 DIAGNOSIS — B259 Cytomegaloviral disease, unspecified: Secondary | ICD-10-CM | POA: Diagnosis not present

## 2019-02-20 DIAGNOSIS — B349 Viral infection, unspecified: Secondary | ICD-10-CM | POA: Diagnosis not present

## 2019-02-21 LAB — TSH: TSH: 0.863 u[IU]/mL (ref 0.450–4.500)

## 2019-02-21 LAB — T4, FREE: Free T4: 1.71 ng/dL (ref 0.82–1.77)

## 2019-02-26 ENCOUNTER — Other Ambulatory Visit: Payer: Self-pay

## 2019-02-28 ENCOUNTER — Telehealth: Payer: Self-pay

## 2019-02-28 DIAGNOSIS — D899 Disorder involving the immune mechanism, unspecified: Secondary | ICD-10-CM | POA: Diagnosis not present

## 2019-02-28 DIAGNOSIS — Z940 Kidney transplant status: Secondary | ICD-10-CM | POA: Diagnosis not present

## 2019-02-28 NOTE — Telephone Encounter (Signed)
Called pt with a note from the provider And I also reminded pt of her appt on Tuesday and she said that she doesn't need to come bc she already had her lab work done and doesn't need this visit. I informed her that if she doesn't come to her follow up appt that we wont be able to refill her medication and that she hasnt been here since 08/21/18. I asked her if she wanted to reschedule and she refused

## 2019-03-04 ENCOUNTER — Ambulatory Visit: Payer: MEDICARE | Admitting: Internal Medicine

## 2019-03-11 DIAGNOSIS — B349 Viral infection, unspecified: Secondary | ICD-10-CM | POA: Diagnosis not present

## 2019-03-11 DIAGNOSIS — Z940 Kidney transplant status: Secondary | ICD-10-CM | POA: Diagnosis not present

## 2019-03-11 DIAGNOSIS — B259 Cytomegaloviral disease, unspecified: Secondary | ICD-10-CM | POA: Diagnosis not present

## 2019-03-11 DIAGNOSIS — Z5181 Encounter for therapeutic drug level monitoring: Secondary | ICD-10-CM | POA: Diagnosis not present

## 2019-03-12 ENCOUNTER — Ambulatory Visit: Payer: MEDICARE | Admitting: Nurse Practitioner

## 2019-03-12 ENCOUNTER — Encounter (HOSPITAL_COMMUNITY): Payer: Self-pay

## 2019-03-12 ENCOUNTER — Other Ambulatory Visit: Payer: Self-pay

## 2019-03-12 ENCOUNTER — Telehealth: Payer: Self-pay | Admitting: General Practice

## 2019-03-12 ENCOUNTER — Ambulatory Visit (HOSPITAL_COMMUNITY)
Admission: EM | Admit: 2019-03-12 | Discharge: 2019-03-12 | Disposition: A | Discharge status: Home / Self Care | Payer: MEDICARE | Attending: Emergency Medicine | Admitting: Emergency Medicine

## 2019-03-12 ENCOUNTER — Ambulatory Visit (INDEPENDENT_AMBULATORY_CARE_PROVIDER_SITE_OTHER): Payer: MEDICARE

## 2019-03-12 DIAGNOSIS — J181 Lobar pneumonia, unspecified organism: Secondary | ICD-10-CM

## 2019-03-12 DIAGNOSIS — R05 Cough: Secondary | ICD-10-CM | POA: Diagnosis not present

## 2019-03-12 DIAGNOSIS — Z20822 Contact with and (suspected) exposure to covid-19: Secondary | ICD-10-CM

## 2019-03-12 DIAGNOSIS — J189 Pneumonia, unspecified organism: Secondary | ICD-10-CM

## 2019-03-12 MED ORDER — ACETAMINOPHEN 325 MG PO TABS
ORAL_TABLET | ORAL | Status: AC
  Filled 2019-03-12: qty 2

## 2019-03-12 MED ORDER — AZITHROMYCIN 250 MG PO TABS: 250.0000 mg | ORAL_TABLET | Freq: Every day | ORAL | 0 refills | Status: DC

## 2019-03-12 MED ORDER — ACETAMINOPHEN 325 MG PO TABS
650.0000 mg | ORAL_TABLET | Freq: Once | ORAL | Status: AC
  Administered 2019-03-12: 650 mg via ORAL

## 2019-03-12 MED ORDER — BENZONATATE 100 MG PO CAPS: 100.0000 mg | ORAL_CAPSULE | Freq: Three times a day (TID) | ORAL | 0 refills | Status: DC

## 2019-03-12 MED ORDER — CEFDINIR 300 MG PO CAPS: 300.0000 mg | ORAL_CAPSULE | Freq: Every day | ORAL | 0 refills | Status: DC

## 2019-03-12 NOTE — Telephone Encounter (Signed)
Pt has been scheduled for Covid testing.   Pt was referred by: Glendale Chard, MD

## 2019-03-12 NOTE — Addendum Note (Signed)
Addended by: Dimple Nanas on: 03/12/2019 05:09 PM   Modules accepted: Orders

## 2019-03-12 NOTE — Discharge Instructions (Signed)
Take Zpack as directed. Important to finish your cefdinir until all tabs are gone (10 days) Please follow-up with your PCP and transplant team regarding your pneumonia diagnosis. Return if you develop worsening cough, shortness of breath, fever does not resolve.

## 2019-03-12 NOTE — ED Triage Notes (Signed)
Pt states she has been coughing started 5 days ago. Pt states that she has back pain. This started last night.

## 2019-03-12 NOTE — ED Notes (Signed)
Went to get patient for cxr, patient was not in room

## 2019-03-12 NOTE — ED Provider Notes (Signed)
[place]    CSN: [id number] Arrival date & time: 03/12/19  8786     History   Chief Complaint Chief Complaint  Patient presents with  . Cough  . Back Pain    HPI Yvette Jenkins is a 63 y.o. female with history of hypertension, Graves' disease, renal transplant presenting for 5-day course of nonproductive cough.  Patient followed by renal transplant team at [facility] routinely: Last serum creatinine 1.7 per patient, done 3 months ago.  Patient endorsing nonproductive, not hemoptic cough, fever, malaise.  Patient also had single episode of diarrhea that was without blood or melena the other night.  Patient denies known sick contacts or contacts with COVID positive persons.  Denies myalgias, chest pain, shortness of breath, abdominal pain, nausea, vomiting, lower extremity edema.  Patient denies change in urinary habits, hematuria, burning with urination, anuria.  Change in medication since her last appointment with her transplant doctor.   Past Medical History:  Diagnosis Date  . Acquired renal cyst of right kidney   . Anemia associated with chronic renal failure    IV Iron infusion   . Arthritis   . Coronary artery disease    per pt cardiac cath at [facility] 10-26-2015  non-obstructive cad of one vessel  . End-stage renal disease on peritoneal dialysis [facility])    nephrologist-  dr Justin Mend [facility])  daily dialysis start 7-8pm to 7-8am-- currently on kidney transplant list [facility]  . Gross hematuria   . History of Graves' disease   . Hypertension   . Hypothyroidism, postradioiodine therapy   . Kidney transplant candidate    on waiting list at [facility]  . Left nephrolithiasis    non-obstructive per pt   . PONV (postoperative nausea and vomiting)   . Secondary hyperparathyroidism of renal origin [facility])     Patient Active Problem List   Diagnosis Date Noted  . Hypothyroid 05/22/2018  . Vitamin D deficiency 05/22/2018  . Benign hypertension with ESRD (end-stage renal  disease) ([place]) 05/22/2018    Past Surgical History:  Procedure Laterality Date  . CARDIAC CATHETERIZATION  11-17-2015   [facility]- dr Marcello Moores povsic   abnormal stress test w/ ischemia (received from nephrologist dr webb office) non-obstrucitve CAD- diffuse mid to distal LAD 60% and insignificant RCA,  stress test does not really match up with this lesion and this lesion is likely to remain insignificant  . CARDIOVASCULAR STRESS TEST  10/15/2015   Intermediate risk nuclear study w/ moderate size and intensity, basal to mid inferior wall reversible perfusion defect suggestive of ischemia/  ef 57%,  inferior and inferoseptal hypokinesis to dyskinesis  . [place]  . CYSTOSCOPY W/ RETROGRADES Bilateral 07/11/2016   Procedure: CYSTOSCOPY WITH RETROGRADE PYELOGRAM;  Surgeon: Nickie Retort, MD;  Location: [facility];  Service: Urology;  Laterality: Bilateral;  . CYSTOSCOPY WITH BIOPSY Left 07/11/2016   Procedure: CYSTOSCOPY WITH  LEFT RENAL PELVIS BIOPSY;  Surgeon: Nickie Retort, MD;  Location[facility];  Service: Urology;  Laterality: Left;  . CYSTOSCOPY WITH STENT PLACEMENT Left 07/11/2016   Procedure: CYSTOSCOPY WITH STENT PLACEMENT;  Surgeon: Nickie Retort, MD;  Location: [facility];  Service: Urology;  Laterality: Left;  . CYSTOSCOPY WITH URETEROSCOPY Left 07/11/2016   Procedure: CYSTOSCOPY WITH URETEROSCOPY;  Surgeon: Nickie Retort, MD;  Location: [facility];  Service: Urology;  Laterality: Left;  . INSERTION OF DIALYSIS CATHETER  06-23-2015  at [facility]   Tenckhoff coiled peritoneal catheter  . 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 TRANSTHORACIC ECHOCARDIOGRAM  10/15/2015   poor acoustic windows limit study-  ef 50% with akinesis of the basal posterior, inferior , and lateral walls,  grade 1 diastolic dysfunction/;  . WRIST ARTHROSCOPY W/ TRIANGULAR  FIBROCARTILAGE REPAIR Right 03/10/2010    OB History   No obstetric history on file.      Home Medications    Prior to Admission medications   Medication Sig Start Date End Date Taking? Authorizing Provider  acetaminophen (TYLENOL) 500 MG tablet Take 500 mg by mouth every 8 (eight) hours as needed.    [provider]  amLODipine (NORVASC) 10 MG tablet Take 10 mg by mouth daily.    [provider]  azithromycin (ZITHROMAX) 250 MG tablet Take 1 tablet (250 mg total) by mouth daily. Take first 2 tablets together, then 1 every day until finished. 03/12/19   Hall-Potvin, [place], PA-C  benzonatate (TESSALON) 100 MG capsule Take 1 capsule (100 mg total) by mouth every 8 (eight) hours. 03/12/19   Hall-Potvin, [place], PA-C  cefdinir (OMNICEF) 300 MG capsule Take 1 capsule (300 mg total) by mouth daily. 03/12/19   Hall-Potvin, [place], PA-C  mycophenolate (CELLCEPT) 250 MG capsule Take 1,000 mg by mouth 2 (two) times daily.    [provider]  predniSONE (DELTASONE) 5 MG tablet Take 15 mg by mouth daily with breakfast.    [provider]  ranitidine (ZANTAC) 150 MG capsule Take 150 mg by mouth every evening.    [provider]  senna-docusate (SENOKOT-S) 8.6-50 MG tablet Take 2 tablets by mouth 2 (two) times daily.    [provider]  sevelamer (RENAGEL) 800 MG tablet Take 800 mg by mouth 3 (three) times daily with meals.    [provider]  sulfamethoxazole-trimethoprim (BACTRIM DS,SEPTRA DS) 800-160 MG tablet Take 1 tablet by mouth 3 (three) times a week.    [provider]  SYNTHROID 150 MCG tablet Take 1 tablet (150 mcg total) by mouth daily before breakfast. Take 1 tablet by mouth Mon- Fri and 1/2 tab on Sun Patient taking differently: Take 150 mcg by mouth daily before breakfast. Take  1 tablet by mouth Monday - Friday, 1/2 tab on Saturday  and nothing on Sunday 12/25/18   [place] Chard, MD  tacrolimus (PROGRAF) 5 MG capsule  Take 5 mg by mouth 2 (two) times daily.    [provider]  valGANciclovir (VALCYTE) 450 MG tablet Take 450 mg by mouth daily.    [provider]  Wheat Dextrin (BENEFIBER) POWD Take by mouth daily.    [provider]    Family History Family History  Problem Relation Age of Onset  . Hypertension Other   . Diabetes Other   . Renal Disease Mother   . Heart attack Father     Social History Social History   Tobacco Use  . Smoking status: Never Smoker  . Smokeless tobacco: Never Used  Substance Use Topics  . Alcohol use: No  . Drug use: No     Allergies   Aspirin and Tetracyclines & related   Review of Systems Review of Systems  Constitutional: Positive for fever. Negative for appetite change.  HENT: Negative for congestion, postnasal drip, sore throat and trouble swallowing.   Respiratory: Positive for cough. Negative for chest tightness, shortness of breath and wheezing.   Cardiovascular: Negative for  chest pain and palpitations.  Gastrointestinal: Positive for diarrhea. Negative for abdominal pain, nausea and vomiting.  Genitourinary: Negative for decreased urine volume, difficulty urinating, flank pain, hematuria and urgency.     Physical Exam Triage Vital Signs ED Triage Vitals  Enc Vitals Group     BP      Pulse      Resp      Temp      Temp src      SpO2      Weight      Height      Head Circumference      Peak Flow      Pain Score      Pain Loc      Pain Edu?      Excl. in [place]?    No data found.  Updated Vital Signs BP 114/66 (BP Location: Right Arm)   Pulse 99   Temp (!) 103.1 F (39.5 C) (Oral)   Resp 18   Wt 160 lb (72.6 kg)   SpO2 97%   BMI 30.23 kg/m   Visual Acuity Right Eye Distance:   Left Eye Distance:   Bilateral Distance:    Right Eye Near:   Left Eye Near:    Bilateral Near:     Physical Exam Constitutional:      General: She is not in acute distress.    Appearance: She is obese. She is not  ill-appearing.  HENT:     Head: Normocephalic and atraumatic.     Right Ear: Tympanic membrane, ear canal and external ear normal.     Left Ear: Tympanic membrane, ear canal and external ear normal.     Nose: Nose normal.     Mouth/Throat:     Mouth: Mucous membranes are moist.     Pharynx: No oropharyngeal exudate or posterior oropharyngeal erythema.  Eyes:     General: No scleral icterus.    Conjunctiva/sclera: Conjunctivae normal.     Pupils: Pupils are equal, round, and reactive to light.  Neck:     Musculoskeletal: Neck supple. No muscular tenderness.  Cardiovascular:     Rate and Rhythm: Normal rate and regular rhythm.     Pulses: Normal pulses.     Heart sounds: Normal heart sounds.  Pulmonary:     Effort: Pulmonary effort is normal. No respiratory distress.     Breath sounds: No wheezing.  Lymphadenopathy:     Cervical: No cervical adenopathy.  Skin:    General: Skin is warm.     Capillary Refill: Capillary refill takes less than 2 seconds.     Coloration: Skin is not jaundiced or pale.  Neurological:     General: No focal deficit present.     Mental Status: She is alert and oriented to person, place, and time.  Psychiatric:        Mood and Affect: Mood normal.        Thought Content: Thought content normal.      UC Treatments / Results  Labs (all labs ordered are listed, but only abnormal results are displayed) Labs Reviewed - No data to display  EKG   Radiology Dg Chest 2 View  Result Date: 03/12/2019 CLINICAL DATA:  Cough since 03/08/2019.  Fever today. EXAM: CHEST - 2 VIEW COMPARISON:  None. FINDINGS: They were is right lower lobe airspace disease consistent with pneumonia. Mild streaky opacity in left lung base is likely due to atelectasis with elevation of the left hemidiaphragm noted. No  pneumothorax or pleural effusion. Heart size is normal. Aortic atherosclerosis noted. No acute or focal bony abnormality. IMPRESSION: Right lower lobe pneumonia. Streaky  opacity left lung base is likely due to atelectasis. Atherosclerosis. Electronically Signed   By: Inge Rise M.D.   On: 03/12/2019 10:19    Procedures Procedures (including critical care time)  Medications Ordered in UC Medications  acetaminophen (TYLENOL) tablet 650 mg (650 mg Oral Given 03/12/19 1004)  acetaminophen (TYLENOL) 325 MG tablet (has no administration in time range)    Initial Impression / Assessment and Plan / UC Course  I have reviewed the triage vital signs and the nursing notes.  Pertinent labs & imaging results that were available during my care of the patient were reviewed by me and considered in my medical decision making (see chart for details).     63 year old female with history of hypertension, Graves' disease, renal disease status post renal transplant presenting for 5-day course of productive cough.  COVID testing ordered, patient guided on isolation precautions in the interim.  Advised patient use Tylenol and focus on water consumption throughout the day for any fevers, myalgias.  Chest x-ray done in office, reviewed by radiologist and myself: Right lower lobe pneumonia.  Creatinine clearance per Cockcroft-Gault formula: 39.  Reviewed azithromycin and Ceftin renal dosing as well as return precautions.  Patient verbalized understanding, is agreeable to plan.  We will follow-up with her PCP and transplant team. Final Clinical Impressions(s) / UC Diagnoses   Final diagnoses:  Pneumonia of right lower lobe due to infectious organism [facility])     Discharge Instructions     Take Zpack as directed. Important to finish your cefdinir until all tabs are gone (10 days) Please follow-up with your PCP and transplant team regarding your pneumonia diagnosis. Return if you develop worsening cough, shortness of breath, fever does not resolve.    ED Prescriptions    Medication Sig Dispense Auth. Provider   benzonatate (TESSALON) 100 MG capsule Take 1 capsule (100 mg  total) by mouth every 8 (eight) hours. 21 capsule Hall-Potvin, [place], PA-C   azithromycin (ZITHROMAX) 250 MG tablet Take 1 tablet (250 mg total) by mouth daily. Take first 2 tablets together, then 1 every day until finished. 6 tablet Hall-Potvin, [place], PA-C   cefdinir (OMNICEF) 300 MG capsule Take 1 capsule (300 mg total) by mouth daily. 10 capsule Hall-Potvin, [place], PA-C     Controlled Substance Prescriptions [place] Controlled Substance Registry consulted? Not Applicable   [place] Sheehan, [place] 03/12/19 1108

## 2019-03-13 ENCOUNTER — Other Ambulatory Visit: Payer: MEDICARE

## 2019-03-13 ENCOUNTER — Telehealth: Payer: Self-pay | Admitting: *Deleted

## 2019-03-13 DIAGNOSIS — R6889 Other general symptoms and signs: Secondary | ICD-10-CM | POA: Diagnosis not present

## 2019-03-13 DIAGNOSIS — Z20822 Contact with and (suspected) exposure to covid-19: Secondary | ICD-10-CM

## 2019-03-13 NOTE — Telephone Encounter (Signed)
-----   Message from [place], [place] sent at 03/12/2019 10:28 AM EDT ----- Regarding: needs covid testing Cough x 5 days. CXR showing RLL PNA. H/o renal transplant in Sept 2019.

## 2019-03-13 NOTE — Telephone Encounter (Signed)
Encounter opened in error

## 2019-03-17 ENCOUNTER — Telehealth: Payer: Self-pay

## 2019-03-17 LAB — NOVEL CORONAVIRUS, NAA: SARS-CoV-2, NAA: NOT DETECTED

## 2019-03-17 NOTE — Telephone Encounter (Signed)
I returned the pt's husband call and left a message that I was returning his call.  He left a message that he needed clarification about the pt's appt for this Wednesday.

## 2019-03-18 ENCOUNTER — Telehealth: Payer: Self-pay | Admitting: Internal Medicine

## 2019-03-18 NOTE — Telephone Encounter (Signed)
RESULTS WAS DELIVER TO HUSBAND

## 2019-03-19 ENCOUNTER — Encounter (HOSPITAL_COMMUNITY): Payer: Self-pay

## 2019-03-19 ENCOUNTER — Inpatient Hospital Stay (HOSPITAL_COMMUNITY)
Admission: EM | Admit: 2019-03-19 | Discharge: 2019-03-21 | DRG: 194 | Disposition: A | Discharge status: Home / Self Care | Payer: MEDICARE | Attending: Internal Medicine | Admitting: Internal Medicine

## 2019-03-19 ENCOUNTER — Ambulatory Visit (INDEPENDENT_AMBULATORY_CARE_PROVIDER_SITE_OTHER): Payer: MEDICARE | Admitting: Nurse Practitioner

## 2019-03-19 ENCOUNTER — Other Ambulatory Visit: Payer: Self-pay

## 2019-03-19 ENCOUNTER — Encounter: Payer: Self-pay | Admitting: Nurse Practitioner

## 2019-03-19 ENCOUNTER — Emergency Department (HOSPITAL_COMMUNITY): Payer: MEDICARE

## 2019-03-19 VITALS — BP 113/74 | HR 96 | Temp 97.7°F | Wt 163.0 lb

## 2019-03-19 DIAGNOSIS — Z940 Kidney transplant status: Secondary | ICD-10-CM

## 2019-03-19 DIAGNOSIS — E039 Hypothyroidism, unspecified: Secondary | ICD-10-CM | POA: Diagnosis present

## 2019-03-19 DIAGNOSIS — J189 Pneumonia, unspecified organism: Principal | ICD-10-CM | POA: Diagnosis present

## 2019-03-19 DIAGNOSIS — Z79890 Hormone replacement therapy (postmenopausal): Secondary | ICD-10-CM

## 2019-03-19 DIAGNOSIS — R197 Diarrhea, unspecified: Secondary | ICD-10-CM | POA: Insufficient documentation

## 2019-03-19 DIAGNOSIS — I2510 Atherosclerotic heart disease of native coronary artery without angina pectoris: Secondary | ICD-10-CM | POA: Diagnosis not present

## 2019-03-19 DIAGNOSIS — D649 Anemia, unspecified: Secondary | ICD-10-CM | POA: Diagnosis present

## 2019-03-19 DIAGNOSIS — Z7952 Long term (current) use of systemic steroids: Secondary | ICD-10-CM

## 2019-03-19 DIAGNOSIS — E871 Hypo-osmolality and hyponatremia: Secondary | ICD-10-CM | POA: Diagnosis present

## 2019-03-19 DIAGNOSIS — Z1159 Encounter for screening for other viral diseases: Secondary | ICD-10-CM

## 2019-03-19 DIAGNOSIS — I10 Essential (primary) hypertension: Secondary | ICD-10-CM | POA: Diagnosis present

## 2019-03-19 DIAGNOSIS — Z79899 Other long term (current) drug therapy: Secondary | ICD-10-CM

## 2019-03-19 DIAGNOSIS — E890 Postprocedural hypothyroidism: Secondary | ICD-10-CM | POA: Diagnosis present

## 2019-03-19 DIAGNOSIS — Z20828 Contact with and (suspected) exposure to other viral communicable diseases: Secondary | ICD-10-CM | POA: Diagnosis not present

## 2019-03-19 DIAGNOSIS — Z09 Encounter for follow-up examination after completed treatment for conditions other than malignant neoplasm: Secondary | ICD-10-CM

## 2019-03-19 DIAGNOSIS — J181 Lobar pneumonia, unspecified organism: Secondary | ICD-10-CM

## 2019-03-19 DIAGNOSIS — R0602 Shortness of breath: Secondary | ICD-10-CM | POA: Diagnosis not present

## 2019-03-19 LAB — URINALYSIS, ROUTINE W REFLEX MICROSCOPIC
Bilirubin Urine: NEGATIVE
Glucose, UA: NEGATIVE mg/dL
Hgb urine dipstick: NEGATIVE
Ketones, ur: NEGATIVE mg/dL
Leukocytes,Ua: NEGATIVE
Nitrite: NEGATIVE
Protein, ur: NEGATIVE mg/dL
Specific Gravity, Urine: 1.011 (ref 1.005–1.030)
pH: 6 (ref 5.0–8.0)

## 2019-03-19 LAB — CBC
HCT: 34.2 % — ABNORMAL LOW (ref 36.0–46.0)
Hemoglobin: 10.9 g/dL — ABNORMAL LOW (ref 12.0–15.0)
MCH: 26.7 pg (ref 26.0–34.0)
MCHC: 31.9 g/dL (ref 30.0–36.0)
MCV: 83.6 fL (ref 80.0–100.0)
Platelets: 540 10*3/uL — ABNORMAL HIGH (ref 150–400)
RBC: 4.09 MIL/uL (ref 3.87–5.11)
RDW: 13.6 % (ref 11.5–15.5)
WBC: 19.9 10*3/uL — ABNORMAL HIGH (ref 4.0–10.5)
nRBC: 0 % (ref 0.0–0.2)

## 2019-03-19 LAB — COMPREHENSIVE METABOLIC PANEL
ALT: 26 U/L (ref 0–44)
AST: 28 U/L (ref 15–41)
Albumin: 2.7 g/dL — ABNORMAL LOW (ref 3.5–5.0)
Alkaline Phosphatase: 92 U/L (ref 38–126)
Anion gap: 9 (ref 5–15)
BUN: 13 mg/dL (ref 8–23)
CO2: 21 mmol/L — ABNORMAL LOW (ref 22–32)
Calcium: 10 mg/dL (ref 8.9–10.3)
Chloride: 98 mmol/L (ref 98–111)
Creatinine, Ser: 1.41 mg/dL — ABNORMAL HIGH (ref 0.44–1.00)
GFR calc Af Amer: 46 mL/min — ABNORMAL LOW (ref >60)
GFR calc non Af Amer: 40 mL/min — ABNORMAL LOW (ref >60)
Glucose, Bld: 115 mg/dL — ABNORMAL HIGH (ref 70–99)
Potassium: 4.6 mmol/L (ref 3.5–5.1)
Sodium: 128 mmol/L — ABNORMAL LOW (ref 135–145)
Total Bilirubin: 0.4 mg/dL (ref 0.3–1.2)
Total Protein: 6.8 g/dL (ref 6.5–8.1)

## 2019-03-19 LAB — SARS CORONAVIRUS 2 BY RT PCR (HOSPITAL ORDER, PERFORMED IN ~~LOC~~ HOSPITAL LAB): SARS Coronavirus 2: NEGATIVE

## 2019-03-19 LAB — LIPASE, BLOOD: Lipase: 23 U/L (ref 11–51)

## 2019-03-19 MED ORDER — SODIUM CHLORIDE 0.9 % IV BOLUS
500.0000 mL | Freq: Once | INTRAVENOUS | Status: AC
  Administered 2019-03-19: 500 mL via INTRAVENOUS

## 2019-03-19 MED ORDER — VANCOMYCIN HCL 10 G IV SOLR: 1750.0000 mg | INTRAVENOUS | Status: DC

## 2019-03-19 MED ORDER — VANCOMYCIN HCL 10 G IV SOLR
1500.0000 mg | Freq: Once | INTRAVENOUS | Status: AC
  Administered 2019-03-19: 23:00:00 1500 mg via INTRAVENOUS
  Filled 2019-03-19: qty 1500

## 2019-03-19 MED ORDER — SODIUM CHLORIDE 0.9 % IV SOLN
2.0000 g | Freq: Two times a day (BID) | INTRAVENOUS | Status: DC
  Administered 2019-03-19 – 2019-03-21 (×4): 2 g via INTRAVENOUS
  Filled 2019-03-19 (×5): qty 2

## 2019-03-19 MED ORDER — SODIUM CHLORIDE 0.9% FLUSH: 3.0000 mL | Freq: Once | INTRAVENOUS | Status: DC

## 2019-03-19 NOTE — Progress Notes (Signed)
Virtual Visit via video   This visit type was conducted due to national recommendations for restrictions regarding the COVID-19 Pandemic (e.g. social distancing) in an effort to limit this patient's exposure and mitigate transmission in our community.  Due to her co-morbid illnesses, this patient is at least at moderate risk for complications without adequate follow up.  This format is felt to be most appropriate for this patient at this time.  All issues noted in this document were discussed and addressed.  A limited physical exam was performed with this format.    This visit type was conducted due to national recommendations for restrictions regarding the COVID-19 Pandemic (e.g. social distancing) in an effort to limit this patient's exposure and mitigate transmission in our community.  Patients identity confirmed using two different identifiers.  This format is felt to be most appropriate for this patient at this time.  All issues noted in this document were discussed and addressed.  No physical exam was performed (except for noted visual exam findings with Video Visits).    Date:  03/19/2019   ID:  Yvette Jenkins, DOB 08/22/1956, MRN [id number]  Patient Location:  Home - spoke with [name]  Provider location:   Office   Chief Complaint:  Follow up Urgent Care  History of Present Illness:    Yvette Jenkins is a 63 y.o. female who presents via video conferencing for a telehealth visit today.    The patient does have symptoms concerning for COVID-19 infection (fever, chills, cough, or new shortness of breath).   She was seen in Urgent Care on 7/8 for cough and fever up to 103.  She was treated with Cefdinir and azithromycin for pneumonia.  She continues to have diarrhea.  She is taking a cough capsule with minimal effect.  She is using an incentive spirometer.    Since her visit to the Urgent Care she still does not feel well, continues to have diarrhea.    Diarrhea  This is  a new problem. The current episode started 1 to 4 weeks ago. The problem occurs 2 to 4 times per day. The problem has been unchanged. The stool consistency is described as watery. The patient states that diarrhea does not awaken her from sleep. Associated symptoms include coughing. Pertinent negatives include no abdominal pain, fever, headaches or vomiting. Nothing aggravates the symptoms. There are no known risk factors. The treatment provided no relief. There is no history of bowel resection.     Past Medical History:  Diagnosis Date  . Acquired renal cyst of right kidney   . Anemia associated with chronic renal failure    IV Iron infusion   . Arthritis   . Coronary artery disease    per pt cardiac cath at [facility] 10-26-2015  non-obstructive cad of one vessel  . End-stage renal disease on peritoneal dialysis [facility])    nephrologist-  dr Justin [facility])  daily dialysis start 7-8pm to 7-8am-- currently on kidney transplant list at [facility]  . Gross hematuria   . History of Graves' disease   . Hypertension   . Hypothyroidism, postradioiodine therapy   . Kidney transplant candidate    on waiting list at [facility]  . Left nephrolithiasis    non-obstructive per pt   . PONV (postoperative nausea and vomiting)   . Secondary hyperparathyroidism of renal origin [facility])    Past Surgical History:  Procedure Laterality Date  . CARDIAC CATHETERIZATION  11-17-2015   [facility]- dr Marcello Moores  povsic   abnormal stress test w/ ischemia (received from nephrologist dr webb office) non-obstrucitve CAD- diffuse mid to distal LAD 60% and insignificant RCA,  stress test does not really match up with this lesion and this lesion is likely to remain insignificant  . CARDIOVASCULAR STRESS TEST  10/15/2015   Intermediate risk nuclear study w/ moderate size and intensity, basal to mid inferior wall reversible perfusion defect suggestive of ischemia/  ef 57%,  inferior and inferoseptal hypokinesis to dyskinesis  . [place]  . CYSTOSCOPY W/ RETROGRADES Bilateral 07/11/2016   Procedure: CYSTOSCOPY WITH RETROGRADE PYELOGRAM;  Surgeon: Nickie Retort, MD;  Location: [facility];  Service: Urology;  Laterality: Bilateral;  . CYSTOSCOPY WITH BIOPSY Left 07/11/2016   Procedure: CYSTOSCOPY WITH  LEFT RENAL PELVIS BIOPSY;  Surgeon: Nickie Retort, MD;  Location: [facility];  Service: Urology;  Laterality: Left;  . CYSTOSCOPY WITH STENT PLACEMENT Left 07/11/2016   Procedure: CYSTOSCOPY WITH STENT PLACEMENT;  Surgeon: Nickie Retort, MD;  Location: [facility];  Service: Urology;  Laterality: Left;  . CYSTOSCOPY WITH URETEROSCOPY Left 07/11/2016   Procedure: CYSTOSCOPY WITH URETEROSCOPY;  Surgeon: Nickie Retort, MD;  [facility];  Service: Urology;  Laterality: Left;  . INSERTION OF DIALYSIS CATHETER  06-23-2015  at [facility]   Tenckhoff coiled peritoneal catheter  . 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 TRANSTHORACIC ECHOCARDIOGRAM  10/15/2015   poor acoustic windows limit study-  ef 50% with akinesis of the basal posterior, inferior , and lateral walls,  grade 1 diastolic dysfunction/;  . WRIST ARTHROSCOPY W/ TRIANGULAR FIBROCARTILAGE REPAIR Right 03/10/2010     Current Meds  Medication Sig  . acetaminophen (TYLENOL) 500 MG tablet Take 500 mg by mouth every 8 (eight) hours as needed.  Marland Kitchen amLODipine (NORVASC) 10 MG tablet Take 10 mg by mouth daily. 1/2 tablet  . atorvastatin (LIPITOR) 10 MG tablet Take 10 mg by mouth daily.  Marland Kitchen azithromycin (ZITHROMAX) 250 MG tablet Take 1 tablet (250 mg total) by mouth daily. Take first 2 tablets together, then 1 every day until finished.  . benzonatate (TESSALON) 100 MG capsule Take 1 capsule (100 mg total) by mouth every 8 (eight) hours.  . cefdinir (OMNICEF) 300 MG capsule Take 1 capsule (300 mg total) by mouth daily.  . mycophenolate (CELLCEPT)  250 MG capsule Take 1,000 mg by mouth 2 (two) times daily. 2 tabs in the morning and 2 tabs in the evening  . predniSONE (DELTASONE) 5 MG tablet Take 15 mg by mouth daily with breakfast.  . SYNTHROID 150 MCG tablet Take 1 tablet (150 mcg total) by mouth daily before breakfast. Take 1 tablet by mouth Mon- Fri and 1/2 tab on Sun (Patient taking differently: Take 150 mcg by mouth daily before breakfast. Take  1 tablet by mouth Monday - Friday, 1/2 tab on Saturday  and nothing on Sunday)  . tacrolimus (PROGRAF) 5 MG capsule Take 5 mg by mouth 2 (two) times daily.     Allergies:   Aspirin and Tetracyclines & related   Social History   Tobacco Use  . Smoking status: Never Smoker  . Smokeless tobacco: Never Used  Substance Use Topics  . Alcohol use: No  . Drug use: No     Family Hx: The patient's family history includes Diabetes in an other family member; Heart attack in her father;  Hypertension in an other family member; Renal Disease in her mother.  ROS:   Please see the history of present illness.    Review of Systems  Constitutional: Negative for fever.  Respiratory: Positive for cough. Negative for hemoptysis, sputum production and shortness of breath.   Cardiovascular: Negative.   Gastrointestinal: Positive for diarrhea. Negative for abdominal pain, nausea and vomiting.  Neurological: Negative for dizziness and headaches.    All other systems reviewed and are negative.   Labs/Other Tests and Data Reviewed:    Recent Labs: 02/20/2019: TSH 0.863   Recent Lipid Panel No results found for: CHOL, TRIG, HDL, CHOLHDL, LDLCALC, LDLDIRECT  Wt Readings from Last 3 Encounters:  03/19/19 163 lb (73.9 kg)  03/12/19 160 lb (72.6 kg)  08/21/18 144 lb 12.8 oz (65.7 kg)     Exam:    Vital Signs:  BP 113/74 (BP Location: Left Arm, Patient Position: Sitting, Cuff Size: Small)   Pulse 96   Temp 97.7 F (36.5 C) (Oral)   Wt 163 lb (73.9 kg)   BMI 30.80 kg/m     Physical Exam   Constitutional: No distress.  Pulmonary/Chest:  She has persistent coughing when speaking for any period of time longer than about 5-10 seconds.  No distress noted via video  Psychiatric: Mood, memory, affect and judgment normal.    ASSESSMENT & PLAN:    1. Pneumonia of left lower lobe due to infectious organism [facility])  She was treated with cefdinir and azithromycin one week ago and continues to not feel well  Coughing is persistent and has difficulty holding a conversation without coughing.    Due to her not feeling much better and her history of kidney transplant I have advised her to return to the ER for further evaluation and treatment  2. Diarrhea, unspecified type  This has been persistent in the last week, I am concerned she may be dehydrated and furthermore will increase the risk to cause damage to her kidneys.  She is advised to go to the ER for further evaluation and labs, she may need IV fluids  3. Kidney transplant recipient  Done on 05/12/2018, I have spoken with Georgie Chard - transplant coordinator at [facility] who agrees the patient should be seen at the local ER for further evaluation.  She has advised if needed can call the transplant provider on call.      COVID-19 Education: The signs and symptoms of COVID-19 were discussed with the patient and how to seek care for testing (follow up with PCP or arrange E-visit).  The importance of social distancing was discussed today.  Patient Risk:   After full review of this patients clinical status, I feel that they are at least moderate risk at this time.  Time:   Today, I have spent 18 minutes/ seconds with the patient with telehealth technology discussing above diagnoses.     Medication Adjustments/Labs and Tests Ordered: Current medicines are reviewed at length with the patient today.  Concerns regarding medicines are outlined above.   Tests Ordered: No orders of the defined types were placed in this encounter.    Medication Changes: No orders of the defined types were placed in this encounter.   Disposition:  Follow up prn  Signed, Minette Brine, FNP

## 2019-03-19 NOTE — ED Notes (Addendum)
Pt states that episodes are infrequent so she is unsure if she can provide a sample. Bedside commode placed at bedside. Pt also requesting to take home medications, EDP informed and states it is ok. Pt instructed on EDP response, and given water and crackers.

## 2019-03-19 NOTE — ED Notes (Signed)
X-ray at bedside

## 2019-03-19 NOTE — ED Notes (Signed)
ED TO INPATIENT HANDOFF REPORT  ED Nurse Name and Phone #: [phone]  S Name/Age/Gender Yvette [place] 63 y.o. female Room/Bed: 025C/025C  Code Status   Code Status: Not on file  Home/SNF/Other Home Patient oriented to: self, place, time and situation Is this baseline? Yes   Triage Complete: Triage complete  Chief Complaint pneumonia, diarrhea, cough  Triage Note Onset 11 days ago NPcough, fever and bodyaches. Onset 8 days ago diarrhea, x 3 today, watery, dark brown. Pt had a negativeCovidtest. Has been on antibiotics forpneumonia since  03-11-19.  Diarrhea started before started taking antibiotics. Cough not improving.    Allergies Allergies  Allergen Reactions  . Aspirin Hives  . Tetracyclines & Related Hives and Other (See Comments)    GI intolerance -"Messes with stomach"    Level of Care/Admitting Diagnosis ED Disposition    ED Disposition Condition [facility]: [place] [100100]  Level of Care: Telemetry Medical [104]  I expect the patient will be discharged within 24 hours: No (not a candidate for 5C-Observation unit)  Covid Evaluation: Asymptomatic Screening Protocol (No Symptoms)  Diagnosis: CAP (community acquired pneumonia) [263335]  Admitting Physician: Rise Patience [phone]  Attending Physician: Rise Patience Lei.Right  PT Class (Do Not Modify): Observation [104]  PT Acc Code (Do Not Modify): Observation [10022]       B Medical/Surgery History Past Medical History:  Diagnosis Date  . Acquired renal cyst of right kidney   . Anemia associated with chronic renal failure    IV Iron infusion   . Arthritis   . Coronary artery disease    per pt cardiac cath at [facility] 10-26-2015  non-obstructive cad of one vessel  . End-stage renal disease on peritoneal dialysis [facility])    nephrologist-  dr Justin Mend [facility])  daily dialysis start 7-8pm to 7-8am-- currently on kidney transplant list at [facility]  .  Gross hematuria   . History of Graves' disease   . Hypertension   . Hypothyroidism, postradioiodine therapy   . Kidney transplant candidate    on waiting list at [facility]  . Left nephrolithiasis    non-obstructive per pt   . PONV (postoperative nausea and vomiting)   . Secondary hyperparathyroidism of renal origin [facility])    Past Surgical History:  Procedure Laterality Date  . CARDIAC CATHETERIZATION  11-17-2015   [facility]- dr Marcello Moores povsic   abnormal stress test w/ ischemia (received from nephrologist dr webb office) non-obstrucitve CAD- diffuse mid to distal LAD 60% and insignificant RCA,  stress test does not really match up with this lesion and this lesion is likely to remain insignificant  . CARDIOVASCULAR STRESS TEST  10/15/2015   Intermediate risk nuclear study w/ moderate size and intensity, basal to mid inferior wall reversible perfusion defect suggestive of ischemia/  ef 57%,  inferior and inferoseptal hypokinesis to dyskinesis  . [place]  . CYSTOSCOPY W/ RETROGRADES Bilateral 07/11/2016   Procedure: CYSTOSCOPY WITH RETROGRADE PYELOGRAM;  Surgeon: Nickie Retort, MD;  Location: [facility];  Service: Urology;  Laterality: Bilateral;  . CYSTOSCOPY WITH BIOPSY Left 07/11/2016   Procedure: CYSTOSCOPY WITH  LEFT RENAL PELVIS BIOPSY;  Surgeon: Nickie Retort, MD;  Location: [facility];  Service: Urology;  Laterality: Left;  . CYSTOSCOPY WITH STENT PLACEMENT Left 07/11/2016   Procedure: CYSTOSCOPY WITH STENT PLACEMENT;  Surgeon: Nickie Retort, MD;  Location: [facility];  Service: Urology;  Laterality: Left;  . CYSTOSCOPY WITH URETEROSCOPY Left 07/11/2016   Procedure: CYSTOSCOPY WITH URETEROSCOPY;  Surgeon: Nickie Retort, MD;  Location: [facility];  Service: Urology;  Laterality: Left;  . INSERTION OF DIALYSIS CATHETER  06-23-2015  at [facility]   Tenckhoff coiled peritoneal catheter  . KIDNEY  TRANSPLANT N/A 05/13/2018   Added a kidney to the bladder  . TRANSTHORACIC ECHOCARDIOGRAM  10/15/2015   poor acoustic windows limit study-  ef 50% with akinesis of the basal posterior, inferior , and lateral walls,  grade 1 diastolic dysfunction/;  . WRIST ARTHROSCOPY W/ TRIANGULAR FIBROCARTILAGE REPAIR Right 03/10/2010     A IV Location/Drains/Wounds Patient Lines/Drains/Airways Status   Active Line/Drains/Airways    Name:   Placement date:   Placement time:   Site:   Days:   Peripheral IV 04/25/18 Right Antecubital   04/25/18    1439    Antecubital   328   Peripheral IV 03/19/19 Left Antecubital   03/19/19    2059    Antecubital   less than 1   Ureteral Drain/Stent Left ureter 6 Fr.   07/11/16    1236    Left ureter   981   Incision (Closed) 07/11/16 Perineum Left   07/11/16    1215     981          Intake/Output Last 24 hours No intake or output data in the 24 hours ending 03/19/19 2319  Labs/Imaging Results for orders placed or performed during the hospital encounter of 03/19/19 (from the past 48 hour(s))  Lipase, blood     Status: None   Collection Time: 03/19/19  6:22 PM  Result Value Ref Range   Lipase 23 11 - 51 U/L    Comment: Performed at [facility], [place] [address], [place], [place] [address]  Comprehensive metabolic panel     Status: Abnormal   Collection Time: 03/19/19  6:22 PM  Result Value Ref Range   Sodium 128 (L) 135 - 145 mmol/L   Potassium 4.6 3.5 - 5.1 mmol/L   Chloride 98 98 - 111 mmol/L   CO2 21 (L) 22 - 32 mmol/L   Glucose, Bld 115 (H) 70 - 99 mg/dL   BUN 13 8 - 23 mg/dL   Creatinine, Ser 1.41 (H) 0.44 - 1.00 mg/dL   Calcium 10.0 8.9 - 10.3 mg/dL   Total Protein 6.8 6.5 - 8.1 g/dL   Albumin 2.7 (L) 3.5 - 5.0 g/dL   AST 28 15 - 41 U/L   ALT 26 0 - 44 U/L   Alkaline Phosphatase 92 38 - 126 U/L   Total Bilirubin 0.4 0.3 - 1.2 mg/dL   GFR calc non Af Amer 40 (L) >60 mL/min   GFR calc Af Amer 46 (L) >60 mL/min   Anion gap 9 5 - 15     Comment: Performed at [facility], [place] [address] [place], [place] [address]  CBC     Status: Abnormal   Collection Time: 03/19/19  6:22 PM  Result Value Ref Range   WBC 19.9 (H) 4.0 - 10.5 K/uL   RBC 4.09 3.87 - 5.11 MIL/uL   Hemoglobin 10.9 (L) 12.0 - 15.0 g/dL   HCT 34.2 (L) 36.0 - 46.0 %   MCV 83.6 80.0 - 100.0 fL   MCH 26.7 26.0 - 34.0 pg   MCHC 31.9 30.0 - 36.0 g/dL   RDW 13.6 11.5 - 15.5 %  Platelets 540 (H) 150 - 400 K/uL   nRBC 0.0 0.0 - 0.2 %    Comment: Performed at [facility], [place] [address], [place], [place] [address]  Urinalysis, Routine w reflex microscopic     Status: None   Collection Time: 03/19/19  7:30 PM  Result Value Ref Range   Color, Urine YELLOW YELLOW   APPearance CLEAR CLEAR   Specific Gravity, Urine 1.011 1.005 - 1.030   pH 6.0 5.0 - 8.0   Glucose, UA NEGATIVE NEGATIVE mg/dL   Hgb urine dipstick NEGATIVE NEGATIVE   Bilirubin Urine NEGATIVE NEGATIVE   Ketones, ur NEGATIVE NEGATIVE mg/dL   Protein, ur NEGATIVE NEGATIVE mg/dL   Nitrite NEGATIVE NEGATIVE   Leukocytes,Ua NEGATIVE NEGATIVE    Comment: Performed at [facility] [address], [place], [place] [address]  SARS Coronavirus 2 (CEPHEID- Performed in [facility] lab), Hosp Order     Status: None   Collection Time: 03/19/19  8:54 PM   Specimen: Nasopharyngeal Swab  Result Value Ref Range   SARS Coronavirus 2 NEGATIVE NEGATIVE    Comment: (NOTE) If result is NEGATIVE SARS-CoV-2 target nucleic acids are NOT DETECTED. The SARS-CoV-2 RNA is generally detectable in upper and lower  respiratory specimens during the acute phase of infection. The lowest  concentration of SARS-CoV-2 viral copies this assay can detect is 250  copies / mL. A negative result does not preclude SARS-CoV-2 infection  and should not be used as the sole basis for treatment or other  patient management decisions.  A negative result may occur with  improper specimen collection / handling,  submission of specimen other  than nasopharyngeal swab, presence of viral mutation(s) within the  areas targeted by this assay, and inadequate number of viral copies  (<250 copies / mL). A negative result must be combined with clinical  observations, patient history, and epidemiological information. If result is POSITIVE SARS-CoV-2 target nucleic acids are DETECTED. The SARS-CoV-2 RNA is generally detectable in upper and lower  respiratory specimens dur ing the acute phase of infection.  Positive  results are indicative of active infection with SARS-CoV-2.  Clinical  correlation with patient history and other diagnostic information is  necessary to determine patient infection status.  Positive results do  not rule out bacterial infection or co-infection with other viruses. If result is PRESUMPTIVE POSTIVE SARS-CoV-2 nucleic acids MAY BE PRESENT.   A presumptive positive result was obtained on the submitted specimen  and confirmed on repeat testing.  While 2019 novel coronavirus  (SARS-CoV-2) nucleic acids may be present in the submitted sample  additional confirmatory testing may be necessary for epidemiological  and / or clinical management purposes  to differentiate between  SARS-CoV-2 and other Sarbecovirus currently known to infect humans.  If clinically indicated additional testing with an alternate test  methodology [phone]) is advised. The SARS-CoV-2 RNA is generally  detectable in upper and lower respiratory sp ecimens during the acute  phase of infection. The expected result is Negative. Fact Sheet for Patients:  StrictlyIdeas.no Fact Sheet for Healthcare Providers: BankingDealers.co.za This test is not yet approved or cleared by the [place] FDA and has been authorized for detection and/or diagnosis of SARS-CoV-2 by FDA under an Emergency Use Authorization (EUA).  This EUA will remain in effect (meaning this test can be  used) for the duration of the COVID-19 declaration under Section 564(b)(1) of the Act, 21 U.S.C. section 360bbb-3(b)(1), unless the authorization is terminated or revoked  sooner. Performed at [facility], [place] [address], [place], [place] [address]    Dg Chest Portable 1 View  Result Date: 03/19/2019 CLINICAL DATA:  Possible covid EXAM: PORTABLE CHEST 1 VIEW COMPARISON:  March 12, 2019 FINDINGS: The heart size and mediastinal contours are stable. The heart size is mildly enlarged. Opacity of right lung base is identified worse compared to prior exam. There is linear opacity left lung base probably due to atelectasis. There is no pulmonary edema. The visualized skeletal structures are unremarkable. IMPRESSION: Right lung base pneumonia, worse compared prior exam. Probable atelectasis of left lung base. Electronically Signed   By: Abelardo Diesel M.D.   On: 03/19/2019 21:06    Pending Labs Unresulted Labs (From admission, onward)    Start     Ordered   03/19/19 2045  Gastrointestinal Panel by PCR , Stool  (Gastrointestinal Panel by PCR, Stool)  Once,   STAT     03/19/19 2044   03/19/19 2040  C difficile quick scan w PCR reflex  (C Difficile quick screen w PCR reflex panel)  Once, for 24 hours,   STAT     03/19/19 2039          Vitals/Pain Today's Vitals   03/19/19 2145 03/19/19 2215 03/19/19 2300 03/19/19 2304  BP: 135/68 (!) 156/95 (!) 141/81   Pulse:   96   Resp: 20 19  (!) 22  Temp:      TempSrc:      SpO2: 95% 98% 97%   Weight:      Height:      PainSc:        Isolation Precautions Enteric precautions (UV disinfection)  Medications Medications  sodium chloride flush (NS) 0.9 % injection 3 mL (3 mLs Intravenous Not Given 03/19/19 2100)  ceFEPIme (MAXIPIME) 2 g in sodium chloride 0.9 % 100 mL IVPB (0 g Intravenous Stopped 03/19/19 2307)  vancomycin (VANCOCIN) 1,500 mg in sodium chloride 0.9 % 500 mL IVPB (1,500 mg Intravenous New Bag/Given 03/19/19 2304)  vancomycin  (VANCOCIN) 1,750 mg in sodium chloride 0.9 % 500 mL IVPB (has no administration in time range)  sodium chloride 0.9 % bolus 500 mL (500 mLs Intravenous New Bag/Given 03/19/19 2100)    Mobility walks Low fall risk   Focused Assessments Pulmonary Assessment Handoff:  Lung sounds: Bilateral Breath Sounds: Clear O2 Device: Room Air        R Recommendations: See Admitting Provider Note  Report given to:   Additional Notes: worsening pneumonia on xray, covid negative, took her own night time medications already.

## 2019-03-19 NOTE — Progress Notes (Addendum)
Pharmacy Antibiotic Note  Yvette [place] is a 63 y.o. female admitted on 03/19/2019 with pneumonia.  Pharmacy has been consulted for cefepime dosing. On cefdinir and azithromycin since 7/8 PTA. PMH of renal transplant - SCr 1.41 on admit (baseline 1.4-1.7)  Plan: Cefepime 2g IV q12h Monitor clinical progress, c/s, renal function F/u de-escalation plan/LOT  ADDENDUM:  Pharmacy consulted to add vancomycin.  Plan: Vancomycin 1500mg  IV x 1; then Vancomycin 1750 mg IV Q 48 hrs. Goal AUC 400-550. Expected AUC: 521 SCr used: 1.41     Temp (24hrs), Avg:98.2 F (36.8 C), Min:97.7 F (36.5 C), Max:98.6 F (37 C)  Recent Labs  Lab 03/19/19 1822  WBC 19.9*  CREATININE 1.41*    CrCl cannot be calculated (Unknown ideal weight.).    Allergies  Allergen Reactions  . Aspirin Hives  . Tetracyclines & Related Hives and Other (See Comments)    GI intolerance -"Messes with stomach"    Elicia Lamp, PharmD, BCPS Please check AMION for all [facility] contact numbers Clinical Pharmacist 03/19/2019 9:32 PM

## 2019-03-19 NOTE — ED Provider Notes (Addendum)
[place] EMERGENCY DEPARTMENT Provider Note   CSN: [id number] Arrival date & time: 03/19/19  1743    History   Chief Complaint Chief Complaint  Patient presents with  . Diarrhea  . Cough    HPI Yvette Jenkins is a 63 y.o. female with a PMH of renal transplant, CAD, hypertension, and Graves' disease who presents with continued cough and diarrhea.  She presented to urgent care on 7/8 with the symptoms and was treated for community-acquired pneumonia.  She was given a course of azithromycin and cefdinir.  She says that she has taken these medications as prescribed and has continued to have the symptoms, and she says that her cough has worsened.  She has also felt intermittent shortness of breath.  She is having liquid bowel movements that are now yellow in color.  Her appetite has reduced and she will occasionally feel chills.  She denies abdominal pain and vomiting.  She denies exposure to anyone with known COVID.  She says her baseline creatinine is about 1.4-1.7.     Past Medical History:  Diagnosis Date  . Acquired renal cyst of right kidney   . Anemia associated with chronic renal failure    IV Iron infusion   . Arthritis   . Coronary artery disease    per pt cardiac cath at [facility] 10-26-2015  non-obstructive cad of one vessel  . End-stage renal disease on peritoneal dialysis [facility])    nephrologist-  dr Justin Mend [facility])  daily dialysis start 7-8pm to 7-8am-- currently on kidney transplant list at [facility]  . Gross hematuria   . History of Graves' disease   . Hypertension   . Hypothyroidism, postradioiodine therapy   . Kidney transplant candidate    on waiting list at [facility]  . Left nephrolithiasis    non-obstructive per pt   . PONV (postoperative nausea and vomiting)   . Secondary hyperparathyroidism of renal origin [facility])     Patient Active Problem List   Diagnosis Date Noted  . Pneumonia of left lower lobe due to infectious organism ([place])  03/19/2019  . Diarrhea 03/19/2019  . Hypothyroid 05/22/2018  . Vitamin D deficiency 05/22/2018  . Benign hypertension with ESRD (end-stage renal disease) ([place]) 05/22/2018    Past Surgical History:  Procedure Laterality Date  . CARDIAC CATHETERIZATION  11-17-2015   [facility]- dr Marcello Moores povsic   abnormal stress test w/ ischemia (received from nephrologist dr webb office) non-obstrucitve CAD- diffuse mid to distal LAD 60% and insignificant RCA,  stress test does not really match up with this lesion and this lesion is likely to remain insignificant  . CARDIOVASCULAR STRESS TEST  10/15/2015   Intermediate risk nuclear study w/ moderate size and intensity, basal to mid inferior wall reversible perfusion defect suggestive of ischemia/  ef 57%,  inferior and inferoseptal hypokinesis to dyskinesis  . [place]  . CYSTOSCOPY W/ RETROGRADES Bilateral 07/11/2016   Procedure: CYSTOSCOPY WITH RETROGRADE PYELOGRAM;  Surgeon: Nickie Retort, MD;  Location[facility];  Service: Urology;  Laterality: Bilateral;  . CYSTOSCOPY WITH BIOPSY Left 07/11/2016   Procedure: CYSTOSCOPY WITH  LEFT RENAL PELVIS BIOPSY;  Surgeon: Nickie Retort, MD;  Location: [facility];  Service: Urology;  Laterality: Left;  . CYSTOSCOPY WITH STENT PLACEMENT Left 07/11/2016   Procedure: CYSTOSCOPY WITH STENT PLACEMENT;  Surgeon: Nickie Retort, MD;  Location: [facility];  Service: Urology;  Laterality: Left;  .  CYSTOSCOPY WITH URETEROSCOPY Left 07/11/2016   Procedure: CYSTOSCOPY WITH URETEROSCOPY;  Surgeon: Nickie Retort, MD;  Location: [facility];  Service: Urology;  Laterality: Left;  . INSERTION OF DIALYSIS CATHETER  06-23-2015  at [facility]   Tenckhoff coiled peritoneal catheter  . 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 TRANSTHORACIC ECHOCARDIOGRAM  10/15/2015   poor acoustic windows limit study-  ef 50%  with akinesis of the basal posterior, inferior , and lateral walls,  grade 1 diastolic dysfunction/;  . WRIST ARTHROSCOPY W/ TRIANGULAR FIBROCARTILAGE REPAIR Right 03/10/2010     OB History   No obstetric history on file.      Home Medications    Prior to Admission medications   Medication Sig Start Date End Date Taking? Authorizing Provider  acetaminophen (TYLENOL) 500 MG tablet Take 500 mg by mouth every 8 (eight) hours as needed.    [provider]  amLODipine (NORVASC) 10 MG tablet Take 10 mg by mouth daily. 1/2 tablet    [provider]  atorvastatin (LIPITOR) 10 MG tablet Take 10 mg by mouth daily.    [provider]  azithromycin (ZITHROMAX) 250 MG tablet Take 1 tablet (250 mg total) by mouth daily. Take first 2 tablets together, then 1 every day until finished. 03/12/19   Hall-Potvin, [place], PA-C  benzonatate (TESSALON) 100 MG capsule Take 1 capsule (100 mg total) by mouth every 8 (eight) hours. 03/12/19   Hall-Potvin, [place], PA-C  cefdinir (OMNICEF) 300 MG capsule Take 1 capsule (300 mg total) by mouth daily. 03/12/19   Hall-Potvin, [place], PA-C  mycophenolate (CELLCEPT) 250 MG capsule Take 1,000 mg by mouth 2 (two) times daily. 2 tabs in the morning and 2 tabs in the evening    [provider]  predniSONE (DELTASONE) 5 MG tablet Take 15 mg by mouth daily with breakfast.    [provider]  SYNTHROID 150 MCG tablet Take 1 tablet (150 mcg total) by mouth daily before breakfast. Take 1 tablet by mouth Mon- Fri and 1/2 tab on Sun Patient taking differently: Take 150 mcg by mouth daily before breakfast. Take  1 tablet by mouth Monday - Friday, 1/2 tab on Saturday  and nothing on Sunday 12/25/18   Glendale Chard, MD  tacrolimus (PROGRAF) 5 MG capsule Take 5 mg by mouth 2 (two) times daily.    [provider]    Family History Family History  Problem Relation Age of Onset  . Hypertension Other   . Diabetes Other   . Renal  Disease Mother   . Heart attack Father     Social History Social History   Tobacco Use  . Smoking status: Never Smoker  . Smokeless tobacco: Never Used  Substance Use Topics  . Alcohol use: No  . Drug use: No     Allergies   Aspirin and Tetracyclines & related   Review of Systems Review of Systems  Constitutional: Positive for activity change, appetite change, chills and fatigue. Negative for fever.  HENT: Negative for congestion and sore throat.   Respiratory: Positive for cough and shortness of breath.   Cardiovascular: Negative for chest pain and leg swelling.  Gastrointestinal: Positive for diarrhea. Negative for abdominal pain, nausea and vomiting.  Genitourinary: Negative for dysuria.  Musculoskeletal: Positive for back pain.  Neurological: Negative for headaches.  Psychiatric/Behavioral: The patient is not nervous/anxious.      Physical Exam Updated Vital Signs BP 135/68  Pulse 93   Temp 98.6 F (37 C) (Oral)   Resp 20   Ht 5\' 2"  (1.575 m)   Wt 73.9 kg   SpO2 95%   BMI 29.81 kg/m   Physical Exam Constitutional:      Appearance: Normal appearance. She is ill-appearing.  HENT:     Head: Normocephalic and atraumatic.     Right Ear: External ear normal.     Left Ear: External ear normal.     Nose: Nose normal. No congestion or rhinorrhea.     Mouth/Throat:     Mouth: Mucous membranes are moist.  Eyes:     Conjunctiva/sclera: Conjunctivae normal.  Neck:     Musculoskeletal: Normal range of motion.  Cardiovascular:     Rate and Rhythm: Normal rate and regular rhythm.     Pulses: Normal pulses.  Pulmonary:     Breath sounds: Rhonchi (in RLQ ) present. No wheezing.     Comments: Appears slightly short of breath Abdominal:     General: Abdomen is flat.     Palpations: Abdomen is soft.  Musculoskeletal: Normal range of motion.        General: No swelling or tenderness.  Skin:    General: Skin is warm and dry.  Neurological:     General: No  focal deficit present.     Mental Status: She is alert and oriented to person, place, and time.  Psychiatric:        Mood and Affect: Mood normal.        Behavior: Behavior normal.      ED Treatments / Results  Labs (all labs ordered are listed, but only abnormal results are displayed) Labs Reviewed  COMPREHENSIVE METABOLIC PANEL - Abnormal; Notable for the following components:      Result Value   Sodium 128 (*)    CO2 21 (*)    Glucose, Bld 115 (*)    Creatinine, Ser 1.41 (*)    Albumin 2.7 (*)    GFR calc non Af Amer 40 (*)    GFR calc Af Amer 46 (*)    All other components within normal limits  CBC - Abnormal; Notable for the following components:   WBC 19.9 (*)    Hemoglobin 10.9 (*)    HCT 34.2 (*)    Platelets 540 (*)    All other components within normal limits  SARS CORONAVIRUS 2 (HOSPITAL ORDER, [facility])  C DIFFICILE QUICK SCREEN W PCR REFLEX  GASTROINTESTINAL PANEL BY PCR, STOOL (REPLACES STOOL CULTURE)  LIPASE, BLOOD  URINALYSIS, ROUTINE W REFLEX MICROSCOPIC    EKG None  Radiology Dg Chest Portable 1 View  Result Date: 03/19/2019 CLINICAL DATA:  Possible covid EXAM: PORTABLE CHEST 1 VIEW COMPARISON:  March 12, 2019 FINDINGS: The heart size and mediastinal contours are stable. The heart size is mildly enlarged. Opacity of right lung base is identified worse compared to prior exam. There is linear opacity left lung base probably due to atelectasis. There is no pulmonary edema. The visualized skeletal structures are unremarkable. IMPRESSION: Right lung base pneumonia, worse compared prior exam. Probable atelectasis of left lung base. Electronically Signed   By: Abelardo Diesel M.D.   On: 03/19/2019 21:06    Procedures Procedures (including critical care time)  Medications Ordered in ED Medications  sodium chloride flush (NS) 0.9 % injection 3 mL (3 mLs Intravenous Not Given 03/19/19 2100)  ceFEPIme (MAXIPIME) 2 g in sodium  chloride 0.9 %  100 mL IVPB (2 g Intravenous New Bag/Given 03/19/19 2211)  vancomycin (VANCOCIN) 1,500 mg in sodium chloride 0.9 % 500 mL IVPB (has no administration in time range)  vancomycin (VANCOCIN) 1,750 mg in sodium chloride 0.9 % 500 mL IVPB (has no administration in time range)  sodium chloride 0.9 % bolus 500 mL (500 mLs Intravenous New Bag/Given 03/19/19 2100)     Initial Impression / Assessment and Plan / ED Course  I have reviewed the triage vital signs and the nursing notes.  Pertinent labs & imaging results that were available during my care of the patient were reviewed by me and considered in my medical decision making (see chart for details).        Although patient appears short of breath, her SPO2 is 98 to 100%.  We will repeat a chest x-ray, COVID test, CBC, and BMP.  We will also test for C. difficile and GI pathogen panel due to immunosuppression.   Due to the infrequency of her bowel movements, we have not been able to obtain a sample for C. difficile or GI pathogen panel testing.  We will start cefepime and vancomycin for pneumonia treatment and consult to hospitalist for admission.  COVID is negative.  Updated husband and daughter and answered all questions.  They would like daily updates from her primary team.   Final Clinical Impressions(s) / ED Diagnoses   Final diagnoses:  Community acquired pneumonia of right lower lobe of lung [facility])    ED Discharge Orders    None       Kathrene Alu, MD 03/19/19 2242    Kathrene Alu, MD 03/19/19 2340    Gareth Morgan, MD 03/21/19 1637

## 2019-03-19 NOTE — ED Triage Notes (Signed)
Onset 11 days ago NPcough, fever and bodyaches. Onset 8 days ago diarrhea, x 3 today, watery, dark brown. Pt had a negativeCovidtest. Has been on antibiotics forpneumonia since  03-11-19.  Diarrhea started before started taking antibiotics. Cough not improving.

## 2019-03-20 ENCOUNTER — Encounter (HOSPITAL_COMMUNITY): Payer: Self-pay | Admitting: Internal Medicine

## 2019-03-20 DIAGNOSIS — Z940 Kidney transplant status: Secondary | ICD-10-CM | POA: Diagnosis not present

## 2019-03-20 DIAGNOSIS — J181 Lobar pneumonia, unspecified organism: Secondary | ICD-10-CM | POA: Diagnosis not present

## 2019-03-20 DIAGNOSIS — J189 Pneumonia, unspecified organism: Secondary | ICD-10-CM | POA: Diagnosis present

## 2019-03-20 DIAGNOSIS — D649 Anemia, unspecified: Secondary | ICD-10-CM | POA: Diagnosis present

## 2019-03-20 DIAGNOSIS — I2510 Atherosclerotic heart disease of native coronary artery without angina pectoris: Secondary | ICD-10-CM | POA: Diagnosis present

## 2019-03-20 DIAGNOSIS — I10 Essential (primary) hypertension: Secondary | ICD-10-CM | POA: Diagnosis present

## 2019-03-20 DIAGNOSIS — R197 Diarrhea, unspecified: Secondary | ICD-10-CM

## 2019-03-20 DIAGNOSIS — E871 Hypo-osmolality and hyponatremia: Secondary | ICD-10-CM | POA: Diagnosis present

## 2019-03-20 DIAGNOSIS — Z1159 Encounter for screening for other viral diseases: Secondary | ICD-10-CM | POA: Diagnosis not present

## 2019-03-20 DIAGNOSIS — Z79899 Other long term (current) drug therapy: Secondary | ICD-10-CM | POA: Diagnosis not present

## 2019-03-20 DIAGNOSIS — Z79890 Hormone replacement therapy (postmenopausal): Secondary | ICD-10-CM | POA: Diagnosis not present

## 2019-03-20 DIAGNOSIS — Z7952 Long term (current) use of systemic steroids: Secondary | ICD-10-CM | POA: Diagnosis not present

## 2019-03-20 DIAGNOSIS — R0602 Shortness of breath: Secondary | ICD-10-CM | POA: Diagnosis not present

## 2019-03-20 DIAGNOSIS — E890 Postprocedural hypothyroidism: Secondary | ICD-10-CM | POA: Diagnosis present

## 2019-03-20 LAB — GASTROINTESTINAL PANEL BY PCR, STOOL (REPLACES STOOL CULTURE)

## 2019-03-20 LAB — CBC WITH DIFFERENTIAL/PLATELET
Abs Immature Granulocytes: 0 10*3/uL (ref 0.00–0.07)
Band Neutrophils: 1 %
Basophils Absolute: 0.1 10*3/uL (ref 0.0–0.1)
Basophils Relative: 1 %
Eosinophils Absolute: 0.1 10*3/uL (ref 0.0–0.5)
Eosinophils Relative: 1 %
HCT: 32.1 % — ABNORMAL LOW (ref 36.0–46.0)
Hemoglobin: 10.4 g/dL — ABNORMAL LOW (ref 12.0–15.0)
Lymphocytes Relative: 5 %
Lymphs Abs: 0.7 10*3/uL (ref 0.7–4.0)
MCH: 26.7 pg (ref 26.0–34.0)
MCHC: 32.4 g/dL (ref 30.0–36.0)
MCV: 82.5 fL (ref 80.0–100.0)
Monocytes Absolute: 0.9 10*3/uL (ref 0.1–1.0)
Monocytes Relative: 6 %
Neutro Abs: 12.8 10*3/uL — ABNORMAL HIGH (ref 1.7–7.7)
Neutrophils Relative %: 86 %
Platelets: 528 10*3/uL — ABNORMAL HIGH (ref 150–400)
RBC: 3.89 MIL/uL (ref 3.87–5.11)
RDW: 13.6 % (ref 11.5–15.5)
WBC: 14.7 10*3/uL — ABNORMAL HIGH (ref 4.0–10.5)
nRBC: 0 % (ref 0.0–0.2)
nRBC: 0 /100 WBC

## 2019-03-20 LAB — MRSA PCR SCREENING: MRSA by PCR: NEGATIVE

## 2019-03-20 LAB — HIV ANTIBODY (ROUTINE TESTING W REFLEX): HIV Screen 4th Generation wRfx: NONREACTIVE

## 2019-03-20 LAB — COMPREHENSIVE METABOLIC PANEL
ALT: 20 U/L (ref 0–44)
AST: 20 U/L (ref 15–41)
Albumin: 2.4 g/dL — ABNORMAL LOW (ref 3.5–5.0)
Alkaline Phosphatase: 89 U/L (ref 38–126)
Anion gap: 9 (ref 5–15)
BUN: 12 mg/dL (ref 8–23)
CO2: 20 mmol/L — ABNORMAL LOW (ref 22–32)
Calcium: 9.8 mg/dL (ref 8.9–10.3)
Chloride: 105 mmol/L (ref 98–111)
Creatinine, Ser: 1.19 mg/dL — ABNORMAL HIGH (ref 0.44–1.00)
GFR calc Af Amer: 57 mL/min — ABNORMAL LOW (ref >60)
GFR calc non Af Amer: 49 mL/min — ABNORMAL LOW (ref >60)
Glucose, Bld: 93 mg/dL (ref 70–99)
Potassium: 4.3 mmol/L (ref 3.5–5.1)
Sodium: 134 mmol/L — ABNORMAL LOW (ref 135–145)
Total Bilirubin: 0.4 mg/dL (ref 0.3–1.2)
Total Protein: 6.2 g/dL — ABNORMAL LOW (ref 6.5–8.1)

## 2019-03-20 MED ORDER — GUAIFENESIN-DM 100-10 MG/5ML PO SYRP
5.0000 mL | ORAL_SOLUTION | ORAL | Status: DC | PRN
  Administered 2019-03-20 – 2019-03-21 (×3): 5 mL via ORAL
  Filled 2019-03-20 (×3): qty 5

## 2019-03-20 MED ORDER — AMLODIPINE BESYLATE 10 MG PO TABS
10.0000 mg | ORAL_TABLET | Freq: Every day | ORAL | Status: DC
  Filled 2019-03-20: qty 1

## 2019-03-20 MED ORDER — ONDANSETRON HCL 4 MG/2ML IJ SOLN: 4.0000 mg | Freq: Four times a day (QID) | INTRAMUSCULAR | Status: DC | PRN

## 2019-03-20 MED ORDER — LEVOTHYROXINE SODIUM 75 MCG PO TABS
150.0000 ug | ORAL_TABLET | Freq: Every day | ORAL | Status: DC
  Filled 2019-03-20: qty 2

## 2019-03-20 MED ORDER — ACETAMINOPHEN 650 MG RE SUPP: 650.0000 mg | Freq: Four times a day (QID) | RECTAL | Status: DC | PRN

## 2019-03-20 MED ORDER — LEVOTHYROXINE SODIUM 75 MCG PO TABS: 150.0000 ug | ORAL_TABLET | Freq: Every day | ORAL | Status: DC

## 2019-03-20 MED ORDER — ONDANSETRON HCL 4 MG PO TABS: 4.0000 mg | ORAL_TABLET | Freq: Four times a day (QID) | ORAL | Status: DC | PRN

## 2019-03-20 MED ORDER — TACROLIMUS 1 MG PO CAPS
3.0000 mg | ORAL_CAPSULE | Freq: Two times a day (BID) | ORAL | Status: DC
  Administered 2019-03-20 – 2019-03-21 (×3): 3 mg via ORAL
  Filled 2019-03-20 (×2): qty 3

## 2019-03-20 MED ORDER — ATORVASTATIN CALCIUM 10 MG PO TABS
10.0000 mg | ORAL_TABLET | Freq: Every day | ORAL | Status: DC
  Administered 2019-03-20: 10 mg via ORAL
  Filled 2019-03-20 (×2): qty 1

## 2019-03-20 MED ORDER — MYCOPHENOLATE MOFETIL 250 MG PO CAPS
500.0000 mg | ORAL_CAPSULE | Freq: Two times a day (BID) | ORAL | Status: DC
  Administered 2019-03-20 – 2019-03-21 (×3): 500 mg via ORAL
  Filled 2019-03-20 (×2): qty 2

## 2019-03-20 MED ORDER — TACROLIMUS 1 MG PO CAPS: 5.0000 mg | ORAL_CAPSULE | Freq: Two times a day (BID) | ORAL | Status: DC

## 2019-03-20 MED ORDER — LEVOTHYROXINE SODIUM 75 MCG PO TABS
150.0000 ug | ORAL_TABLET | ORAL | Status: DC
  Administered 2019-03-21: 150 ug via ORAL
  Filled 2019-03-20: qty 2

## 2019-03-20 MED ORDER — ACETAMINOPHEN 325 MG PO TABS
650.0000 mg | ORAL_TABLET | Freq: Four times a day (QID) | ORAL | Status: DC | PRN
  Administered 2019-03-20 – 2019-03-21 (×2): 650 mg via ORAL
  Filled 2019-03-20 (×2): qty 2

## 2019-03-20 MED ORDER — TACROLIMUS 1 MG PO TB24: 5.0000 mg | ORAL_TABLET | Freq: Every day | ORAL | Status: DC

## 2019-03-20 MED ORDER — ENOXAPARIN SODIUM 40 MG/0.4ML ~~LOC~~ SOLN
40.0000 mg | SUBCUTANEOUS | Status: DC
  Filled 2019-03-20: qty 0.4

## 2019-03-20 MED ORDER — SODIUM CHLORIDE 0.9 % IV SOLN
INTRAVENOUS | Status: AC
  Administered 2019-03-20 (×2): via INTRAVENOUS

## 2019-03-20 MED ORDER — AMLODIPINE BESYLATE 5 MG PO TABS
5.0000 mg | ORAL_TABLET | Freq: Every day | ORAL | Status: DC
  Administered 2019-03-20: 5 mg via ORAL
  Filled 2019-03-20: qty 1

## 2019-03-20 MED ORDER — LEVOTHYROXINE SODIUM 75 MCG PO TABS
150.0000 ug | ORAL_TABLET | Freq: Every day | ORAL | Status: DC
  Administered 2019-03-20: 150 ug via ORAL

## 2019-03-20 MED ORDER — MYCOPHENOLATE MOFETIL 250 MG PO CAPS
500.0000 mg | ORAL_CAPSULE | Freq: Two times a day (BID) | ORAL | Status: DC
  Filled 2019-03-20: qty 2

## 2019-03-20 MED ORDER — PREDNISONE 5 MG PO TABS
5.0000 mg | ORAL_TABLET | Freq: Every day | ORAL | Status: DC
  Administered 2019-03-20 – 2019-03-21 (×2): 5 mg via ORAL
  Filled 2019-03-20 (×4): qty 1

## 2019-03-20 NOTE — Progress Notes (Signed)
Patient seen and examined.  She report cough, and SOB.  She feels weak.   1-PNA; continue with IV antibiotics.  Check MRSA PCR if negative will discontinue vancomycin.  Guaifenesin for cough prn.   2-Renal transplant; renal function improved. Resume transplant medications, home dose reviewed by pharmacy.   3-Diarrhea;  Sample not watery enough to check for c diff.  GI pathogen negative.   4-hyponatremia improved.   Niel Hummer, MD

## 2019-03-20 NOTE — Progress Notes (Signed)
Spoke to miss Matlin this morning who vocalized she is having worsening back pain that tylenol is no addressed and d/t he kidney transplant is not able to take NSAIDs. Her transplant coordinator at [facility] [phone] would like to be informed of any opoid medications the patient may be prescribed in advance. Patient also expressed concern regarding cough which tessalon is not helping. Will reiterate all of this information to Dr. Tyrell Antonio.

## 2019-03-20 NOTE — H&P (Addendum)
History and Physical    Yvette Jenkins XBL:[id number] DOB: [birth year] DOA: 03/19/2019  PCP: Glendale Chard, MD  Patient coming from: Home.  Chief Complaint: Cough diarrhea weakness.  HPI: Yvette Jenkins is a 63 y.o. female with history of renal transplant on immunosuppressants, hypothyroidism, hypertension presents to the ER because of persistent productive cough with diarrhea and weakness.  Patient has been having subjective feeling of fever chills.  Patient symptoms also started 10 days ago with cough.  2 days later patient started having diarrhea.  Body aches subjective feeling of fever chills.  This point patient went to urgent care and was prescribed 2 antibiotics for pneumonia.  COVID test at that time was negative.  Patient has been taking antibiotic for last 5 days despite which patient has been persistent productive cough worsening diarrhea weakness body aches and subjective feeling of fever chills.  ED Course: In the ER chest x-ray shows worsening infiltrates and lab work show sodium of 128 WBC count 19.9 creatinine at baseline of 1.4.  Given immunosuppressed state and worsening infiltrates patient is admitted for pneumonia and started on empiric antibiotics.  Patient was given IV fluids.  Stools test has been ordered.  Call with test was again negative.  Review of Systems: As per HPI, rest all negative.   Past Medical History:  Diagnosis Date  . Acquired renal cyst of right kidney   . Anemia associated with chronic renal failure    IV Iron infusion   . Arthritis   . Coronary artery disease    per pt cardiac cath at [facility] 10-26-2015  non-obstructive cad of one vessel  . End-stage renal disease on peritoneal dialysis [facility])    nephrologist-  dr Justin Mend [facility])  daily dialysis start 7-8pm to 7-8am-- currently on kidney transplant list at [facility]  . Gross hematuria   . History of Graves' disease   . Hypertension   . Hypothyroidism, postradioiodine therapy   . Kidney  transplant candidate    on waiting list at [facility]  . Left nephrolithiasis    non-obstructive per pt   . PONV (postoperative nausea and vomiting)   . Secondary hyperparathyroidism of renal origin [facility])     Past Surgical History:  Procedure Laterality Date  . CARDIAC CATHETERIZATION  11-17-2015   [facility]- dr Marcello Moores povsic   abnormal stress test w/ ischemia (received from nephrologist dr webb office) non-obstrucitve CAD- diffuse mid to distal LAD 60% and insignificant RCA,  stress test does not really match up with this lesion and this lesion is likely to remain insignificant  . CARDIOVASCULAR STRESS TEST  10/15/2015   Intermediate risk nuclear study w/ moderate size and intensity, basal to mid inferior wall reversible perfusion defect suggestive of ischemia/  ef 57%,  inferior and inferoseptal hypokinesis to dyskinesis  . [place]  . CYSTOSCOPY W/ RETROGRADES Bilateral 07/11/2016   Procedure: CYSTOSCOPY WITH RETROGRADE PYELOGRAM;  Surgeon: Nickie Retort, MD;  Location: [facility];  Service: Urology;  Laterality: Bilateral;  . CYSTOSCOPY WITH BIOPSY Left 07/11/2016   Procedure: CYSTOSCOPY WITH  LEFT RENAL PELVIS BIOPSY;  Surgeon: Nickie Retort, MD;  Location: [facility];  Service: Urology;  Laterality: Left;  . CYSTOSCOPY WITH STENT PLACEMENT Left 07/11/2016   Procedure: CYSTOSCOPY WITH STENT PLACEMENT;  Surgeon: Nickie Retort, MD;  Location: [facility];  Service: Urology;  Laterality: Left;  . CYSTOSCOPY WITH URETEROSCOPY Left 07/11/2016   Procedure:  CYSTOSCOPY WITH URETEROSCOPY;  Surgeon: Nickie Retort, MD;  Location: [facility];  Service: Urology;  Laterality: Left;  . INSERTION OF DIALYSIS CATHETER  06-23-2015  at [facility]   Tenckhoff coiled peritoneal catheter  . 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 TRANSTHORACIC ECHOCARDIOGRAM  10/15/2015   poor acoustic  windows limit study-  ef 50% with akinesis of the basal posterior, inferior , and lateral walls,  grade 1 diastolic dysfunction/;  . WRIST ARTHROSCOPY W/ TRIANGULAR FIBROCARTILAGE REPAIR Right 03/10/2010     reports that she has never smoked. She has never used smokeless tobacco. She reports that she does not drink alcohol or use drugs.  Allergies  Allergen Reactions  . Aspirin Hives  . Tetracyclines & Related Hives and Other (See Comments)    GI intolerance -"Messes with stomach"    Family History  Problem Relation Age of Onset  . Hypertension Other   . Diabetes Other   . Renal Disease Mother   . Heart attack Father     Prior to Admission medications   Medication Sig Start Date End Date Taking? Authorizing Provider  acetaminophen (TYLENOL) 500 MG tablet Take 500 mg by mouth every 8 (eight) hours as needed.    [provider]  amLODipine (NORVASC) 10 MG tablet Take 10 mg by mouth daily. 1/2 tablet    [provider]  atorvastatin (LIPITOR) 10 MG tablet Take 10 mg by mouth daily.    [provider]  azithromycin (ZITHROMAX) 250 MG tablet Take 1 tablet (250 mg total) by mouth daily. Take first 2 tablets together, then 1 every day until finished. 03/12/19   Hall-Potvin, [place], PA-C  benzonatate (TESSALON) 100 MG capsule Take 1 capsule (100 mg total) by mouth every 8 (eight) hours. 03/12/19   Hall-Potvin, [place], PA-C  cefdinir (OMNICEF) 300 MG capsule Take 1 capsule (300 mg total) by mouth daily. 03/12/19   Hall-Potvin, [place], PA-C  mycophenolate (CELLCEPT) 250 MG capsule Take 1,000 mg by mouth 2 (two) times daily. 2 tabs in the morning and 2 tabs in the evening    [provider]  predniSONE (DELTASONE) 5 MG tablet Take 15 mg by mouth daily with breakfast.    [provider]  SYNTHROID 150 MCG tablet Take 1 tablet (150 mcg total) by mouth daily before breakfast. Take 1 tablet by mouth Mon- Fri and 1/2 tab on Sun Patient taking differently:  Take 150 mcg by mouth daily before breakfast. Take  1 tablet by mouth Monday - Friday, 1/2 tab on Saturday  and nothing on Sunday 12/25/18   Glendale Chard, MD  tacrolimus (PROGRAF) 5 MG capsule Take 5 mg by mouth 2 (two) times daily.    [provider]    Physical Exam: Constitutional: Moderately built and nourished. Vitals:   03/19/19 2300 03/19/19 2304 03/19/19 2355 03/19/19 2358  BP: (!) 141/81   (!) 135/93  Pulse: 96     Resp: (!) 28 (!) 22  19  Temp:    98.3 F (36.8 C)  TempSrc:    Oral  SpO2: 97%   99%  Weight:   75.5 kg   Height:   5\' 3"  (1.6 m)    Eyes: Anicteric no pallor. ENMT: No discharge from the ears eyes nose or mouth. Neck: No mass or.  No neck rigidity. Respiratory: No rhonchi or crepitations. Cardiovascular: S1-S2 heard. Abdomen: Soft nontender bowel sounds are seen. Musculoskeletal: No edema.  No  joint effusion. Skin: No rash. Neurologic: Alert awake oriented to time place and person.  Moves all extremities. Psychiatric: Appears normal per normal affect.   Labs on Admission: I have personally reviewed following labs and imaging studies  CBC: Recent Labs  Lab 03/19/19 1822  WBC 19.9*  HGB 10.9*  HCT 34.2*  MCV 83.6  PLT 132*   Basic Metabolic Panel: Recent Labs  Lab 03/19/19 1822  NA 128*  K 4.6  CL 98  CO2 21*  GLUCOSE 115*  BUN 13  CREATININE 1.41*  CALCIUM 10.0   GFR: Estimated Creatinine Clearance: 40.2 mL/min (A) (by C-G formula based on SCr of 1.41 mg/dL (H)). Liver Function Tests: Recent Labs  Lab 03/19/19 1822  AST 28  ALT 26  ALKPHOS 92  BILITOT 0.4  PROT 6.8  ALBUMIN 2.7*   Recent Labs  Lab 03/19/19 1822  LIPASE 23   No results for input(s): AMMONIA in the last 168 hours. Coagulation Profile: No results for input(s): INR, PROTIME in the last 168 hours. Cardiac Enzymes: No results for input(s): CKTOTAL, CKMB, CKMBINDEX, TROPONINI in the last 168 hours. BNP (last 3 results) No results for input(s):  PROBNP in the last 8760 hours. HbA1C: No results for input(s): HGBA1C in the last 72 hours. CBG: No results for input(s): GLUCAP in the last 168 hours. Lipid Profile: No results for input(s): CHOL, HDL, LDLCALC, TRIG, CHOLHDL, LDLDIRECT in the last 72 hours. Thyroid Function Tests: No results for input(s): TSH, T4TOTAL, FREET4, T3FREE, THYROIDAB in the last 72 hours. Anemia Panel: No results for input(s): VITAMINB12, FOLATE, FERRITIN, TIBC, IRON, RETICCTPCT in the last 72 hours. Urine analysis:    Component Value Date/Time   COLORURINE YELLOW 03/19/2019 1930   APPEARANCEUR CLEAR 03/19/2019 1930   LABSPEC 1.011 03/19/2019 1930   PHURINE 6.0 03/19/2019 1930   GLUCOSEU NEGATIVE 03/19/2019 1930   HGBUR NEGATIVE 03/19/2019 1930   BILIRUBINUR NEGATIVE 03/19/2019 1930   KETONESUR NEGATIVE 03/19/2019 1930   PROTEINUR NEGATIVE 03/19/2019 1930   UROBILINOGEN 0.2 10/20/2013 0954   NITRITE NEGATIVE 03/19/2019 1930   LEUKOCYTESUR NEGATIVE 03/19/2019 1930   Sepsis Labs: @LABRCNTIP (procalcitonin:4,lacticidven:4) ) Recent Results (from the past 240 hour(s))  Novel Coronavirus, NAA ([facility])     Status: None   Collection Time: 03/13/19  8:47 AM  Result Value Ref Range Status   SARS-CoV-2, NAA Not Detected Not Detected Final    Comment: Testing was performed using the Aptima SARS-CoV-2 assay. This test was developed and its performance characteristics determined by [employer]. This test has not been FDA cleared or approved. This test has been authorized by FDA under an Emergency Use Authorization (EUA). This test is only authorized for the duration of time the declaration that circumstances exist justifying the authorization of the emergency use of in vitro diagnostic tests for detection of SARS-CoV-2 virus and/or diagnosis of COVID-19 infection under section 564(b)(1) of the Act, 21 U.S.C. 440NUU-7(O)(5), unless the authorization is terminated or revoked sooner. When diagnostic  testing is negative, the possibility of a false negative result should be considered in the context of a patient's recent exposures and the presence of clinical signs and symptoms consistent with COVID-19. An individual without symptoms of COVID-19 and who is not shedding SARS-CoV-2 virus would expect to have a negativ e (not detected) result in this assay.   SARS Coronavirus 2 (CEPHEID- Performed in [place] hospital lab), Hosp Order     Status: None   Collection Time: 03/19/19  8:54 PM   Specimen:  Nasopharyngeal Swab  Result Value Ref Range Status   SARS Coronavirus 2 NEGATIVE NEGATIVE Final    Comment: (NOTE) If result is NEGATIVE SARS-CoV-2 target nucleic acids are NOT DETECTED. The SARS-CoV-2 RNA is generally detectable in upper and lower  respiratory specimens during the acute phase of infection. The lowest  concentration of SARS-CoV-2 viral copies this assay can detect is 250  copies / mL. A negative result does not preclude SARS-CoV-2 infection  and should not be used as the sole basis for treatment or other  patient management decisions.  A negative result may occur with  improper specimen collection / handling, submission of specimen other  than nasopharyngeal swab, presence of viral mutation(s) within the  areas targeted by this assay, and inadequate number of viral copies  (<250 copies / mL). A negative result must be combined with clinical  observations, patient history, and epidemiological information. If result is POSITIVE SARS-CoV-2 target nucleic acids are DETECTED. The SARS-CoV-2 RNA is generally detectable in upper and lower  respiratory specimens dur ing the acute phase of infection.  Positive  results are indicative of active infection with SARS-CoV-2.  Clinical  correlation with patient history and other diagnostic information is  necessary to determine patient infection status.  Positive results do  not rule out bacterial infection or co-infection with  other viruses. If result is PRESUMPTIVE POSTIVE SARS-CoV-2 nucleic acids MAY BE PRESENT.   A presumptive positive result was obtained on the submitted specimen  and confirmed on repeat testing.  While 2019 novel coronavirus  (SARS-CoV-2) nucleic acids may be present in the submitted sample  additional confirmatory testing may be necessary for epidemiological  and / or clinical management purposes  to differentiate between  SARS-CoV-2 and other Sarbecovirus currently known to infect humans.  If clinically indicated additional testing with an alternate test  methodology [phone]) is advised. The SARS-CoV-2 RNA is generally  detectable in upper and lower respiratory sp ecimens during the acute  phase of infection. The expected result is Negative. Fact Sheet for Patients:  StrictlyIdeas.no Fact Sheet for Healthcare Providers: BankingDealers.co.za This test is not yet approved or cleared by the [place] FDA and has been authorized for detection and/or diagnosis of SARS-CoV-2 by FDA under an Emergency Use Authorization (EUA).  This EUA will remain in effect (meaning this test can be used) for the duration of the COVID-19 declaration under Section 564(b)(1) of the Act, 21 U.S.C. section 360bbb-3(b)(1), unless the authorization is terminated or revoked sooner. Performed at [facility], [place] [address] [place], [place] [address]      Radiological Exams on Admission: Dg Chest Portable 1 View  Result Date: 03/19/2019 CLINICAL DATA:  Possible covid EXAM: PORTABLE CHEST 1 VIEW COMPARISON:  March 12, 2019 FINDINGS: The heart size and mediastinal contours are stable. The heart size is mildly enlarged. Opacity of right lung base is identified worse compared to prior exam. There is linear opacity left lung base probably due to atelectasis. There is no pulmonary edema. The visualized skeletal structures are unremarkable. IMPRESSION: Right lung  base pneumonia, worse compared prior exam. Probable atelectasis of left lung base. Electronically Signed   By: Abelardo Diesel M.D.   On: 03/19/2019 21:06     Assessment/Plan Principal Problem:   CAP (community acquired pneumonia) Active Problems:   Hypothyroid   Diarrhea   History of renal transplant    1. Community-acquired pneumonia in a immunosuppressed state -was on Zithromax and Omnicef.  Patient at this time is  been placed on vancomycin and cefepime.  Follow cultures.  Continue to closely monitor.  Check urine for Legionella and strep antigen. 2. Diarrhea cause not clear.  Was present even before patient starting antibiotic.  Check stool studies when patient has further diarrhea. 3. Hypothyroidism on Synthroid.  Brand-name requested.  Discussed with pharmacy. 4. Hypertension on amlodipine. 5. Renal transplant with creatinine at baseline when compared to care everywhere.  On 3 immunosuppressants which will be continued. 6. Hyponatremia likely from diarrhea.  Patient receiving fluids.  Follow metabolic panel.   DVT prophylaxis: Lovenox. Code Status: Full code. Family Communication: Discussed with patient. Disposition Plan: Home. Consults called: None. Admission status: Observation.   Rise Patience MD [facility] Pager [phone].  If 7PM-7AM, please contact night-coverage [email or web address] Password [facility]  03/20/2019, 12:54 AM

## 2019-03-20 NOTE — Plan of Care (Signed)
  Problem: Education: Goal: Knowledge of General Education information will improve Description: Including pain rating scale, medication(s)/side effects and non-pharmacologic comfort measures Outcome: Progressing   Problem: Health Behavior/Discharge Planning: well aware of protocol s/p renal transplant  Goal: Ability to manage health-related needs will improve Outcome: Progressing   Problem: Clinical Measurements: verbalize understanding of lab and related diagnostic indicators Goal: Ability to maintain clinical measurements within normal limits will improve Outcome: Progressing Goal: Will remain free from infection Outcome: Progressing

## 2019-03-21 LAB — BASIC METABOLIC PANEL
Anion gap: 6 (ref 5–15)
BUN: 9 mg/dL (ref 8–23)
CO2: 22 mmol/L (ref 22–32)
Calcium: 10.1 mg/dL (ref 8.9–10.3)
Chloride: 110 mmol/L (ref 98–111)
Creatinine, Ser: 1.21 mg/dL — ABNORMAL HIGH (ref 0.44–1.00)
GFR calc Af Amer: 56 mL/min — ABNORMAL LOW (ref >60)
GFR calc non Af Amer: 48 mL/min — ABNORMAL LOW (ref >60)
Glucose, Bld: 94 mg/dL (ref 70–99)
Potassium: 4.8 mmol/L (ref 3.5–5.1)
Sodium: 138 mmol/L (ref 135–145)

## 2019-03-21 LAB — CBC
HCT: 33.2 % — ABNORMAL LOW (ref 36.0–46.0)
Hemoglobin: 10.5 g/dL — ABNORMAL LOW (ref 12.0–15.0)
MCH: 26.6 pg (ref 26.0–34.0)
MCHC: 31.6 g/dL (ref 30.0–36.0)
MCV: 84.3 fL (ref 80.0–100.0)
Platelets: 561 10*3/uL — ABNORMAL HIGH (ref 150–400)
RBC: 3.94 MIL/uL (ref 3.87–5.11)
RDW: 13.8 % (ref 11.5–15.5)
WBC: 12.4 10*3/uL — ABNORMAL HIGH (ref 4.0–10.5)
nRBC: 0 % (ref 0.0–0.2)

## 2019-03-21 MED ORDER — GUAIFENESIN-DM 100-10 MG/5ML PO SYRP: 5.0000 mL | ORAL_SOLUTION | ORAL | 0 refills | Status: DC | PRN

## 2019-03-21 MED ORDER — LEVOFLOXACIN 500 MG PO TABS: 500.0000 mg | ORAL_TABLET | Freq: Every day | ORAL | 0 refills | Status: AC

## 2019-03-21 NOTE — Progress Notes (Signed)
All meds returned from pharmacy.

## 2019-03-21 NOTE — TOC Transition Note (Signed)
Transition of Care [facility]) - CM/SW Discharge Note   Patient Details  Name: Yvette Jenkins MRN: [id number] Date of Birth: 1956/02/09  [facility] CM/SW Contact:  Pollie Friar, RN Phone Number: 03/21/2019, 1:42 PM   Clinical Narrative:    Pt is discharging home with self care. Pt has PCP, insurance and transportation home.  Final next level of care: Home/Self Care Barriers to Discharge: No Barriers Identified   Patient Goals and CMS Choice        Discharge Placement                       Discharge Plan and Services                                     Social Determinants of Health (SDOH) Interventions     Readmission Risk Interventions No flowsheet data found.

## 2019-03-21 NOTE — Discharge Summary (Signed)
Physician Discharge Summary  ISZABELLA Jenkins QIO:[id number] DOB: 20-Aug-1956 DOA: 03/19/2019  PCP: Glendale Chard, MD  Admit date: 03/19/2019 Discharge date: 03/21/2019  Admitted From: Home  Disposition:  Home   Recommendations for Outpatient Follow-up:  1. Follow up with PCP in 1-2 weeks 2. Please obtain BMP/CBC in one week 3. Needs renal function and follow up with [facility] for further care of renal function post transplant.  4. Follow up on resolution of PNA  Home Health: none  Discharge Condition: Stable.  CODE STATUS: Full code Diet recommendation: Heart Healthy   Brief/Interim Summary:  Yvette Jenkins is a 63 y.o. female with history of renal transplant on immunosuppressants, hypothyroidism, hypertension presents to the ER because of persistent productive cough with diarrhea and weakness.  Patient has been having subjective feeling of fever chills.  Patient symptoms also started 10 days ago with cough.  2 days later patient started having diarrhea.  Body aches subjective feeling of fever chills.  This point patient went to urgent care and was prescribed 2 antibiotics for pneumonia.  COVID test at that time was negative.  Patient has been taking antibiotic for last 5 days despite which patient has been persistent productive cough worsening diarrhea weakness body aches and subjective feeling of fever chills.  ED Course: In the ER chest x-ray shows worsening infiltrates and lab work show sodium of 128 WBC count 19.9 creatinine at baseline of 1.4.  Given immunosuppressed state and worsening infiltrates patient is admitted for pneumonia and started on empiric antibiotics.  Patient was given IV fluids.  Stools test has been ordered. COVID  test was again negative.   PNA; continue with IV antibiotics.  Patient admitted with leukocytosis, chest x-ray with worsening pneumonia.  She recently finished 5 days of antibiotics as an outpatient. She was a started on vancomycin and  subsequently stopped because MRSA PCR was negative. White count has decreased from 19-12 on the day of discharge.  She received 3 days of IV cefepime. She will be discharged on Levaquin, QT on EKG is stable. Guaifenesin for cough prn.  She feels better today, cough has been controlled on cough syrup.  2-Renal transplant; renal function improved. Resume transplant medications, home dose reviewed by pharmacy.  Renal function has been stable during this hospitalization. She will need close follow-up with her renal transplant team, to have medications level checked.  3-Diarrhea;  Sample not watery enough to check for c diff.  GI pathogen negative.  Improving.  4-hyponatremia; improved.  5-anemia: Needs further evaluation as an outpatient.  Hemoglobin has been stable.  Discharge Diagnoses:  Principal Problem:   CAP (community acquired pneumonia) Active Problems:   Hypothyroid   Diarrhea   History of renal transplant    Discharge Instructions  Discharge Instructions    Diet - low sodium heart healthy   Complete by: As directed    Increase activity slowly   Complete by: As directed      Allergies as of 03/21/2019      Reactions   Aspirin Hives   Tetracyclines & Related Hives, Other (See Comments)   GI intolerance -"Messes with stomach"      Medication List    STOP taking these medications   azithromycin 250 MG tablet Commonly known as: ZITHROMAX   cefdinir 300 MG capsule Commonly known as: OMNICEF     TAKE these medications   acetaminophen 500 MG tablet Commonly known as: TYLENOL Take 500 mg by mouth every 8 (eight) hours as  needed for mild pain, fever or headache.   amLODipine 10 MG tablet Commonly known as: NORVASC Take 5 mg by mouth daily with supper. Patient takes 1/2 of 10mg  tablet (5mg )   atorvastatin 10 MG tablet Commonly known as: LIPITOR Take 10 mg by mouth daily at 6 PM.   benzonatate 100 MG capsule Commonly known as: TESSALON Take 1 capsule (100  mg total) by mouth every 8 (eight) hours.   guaiFENesin-dextromethorphan 100-10 MG/5ML syrup Commonly known as: ROBITUSSIN DM Take 5 mLs by mouth every 4 (four) hours as needed for cough.   levofloxacin 500 MG tablet Commonly known as: Levaquin Take 1 tablet (500 mg total) by mouth daily for 5 days.   mycophenolate 250 MG capsule Commonly known as: CELLCEPT Take 500 mg by mouth 2 (two) times daily. 9am and 9pm   predniSONE 5 MG tablet Commonly known as: DELTASONE Take 5 mg by mouth daily with breakfast. Patient takes at 9am   Synthroid 150 MCG tablet Generic drug: levothyroxine Take 1 tablet (150 mcg total) by mouth daily before breakfast. Take 1 tablet by mouth Mon- Fri and 1/2 tab on Sun What changed: additional instructions   tacrolimus 1 MG capsule Commonly known as: PROGRAF Take 3 mg by mouth 2 (two) times daily. 9am and 9pm       Allergies  Allergen Reactions  . Aspirin Hives  . Tetracyclines & Related Hives and Other (See Comments)    GI intolerance -"Messes with stomach"    Consultations:  None   Procedures/Studies: Dg Chest 2 View  Result Date: 03/12/2019 CLINICAL DATA:  Cough since 03/08/2019.  Fever today. EXAM: CHEST - 2 VIEW COMPARISON:  None. FINDINGS: They were is right lower lobe airspace disease consistent with pneumonia. Mild streaky opacity in left lung base is likely due to atelectasis with elevation of the left hemidiaphragm noted. No pneumothorax or pleural effusion. Heart size is normal. Aortic atherosclerosis noted. No acute or focal bony abnormality. IMPRESSION: Right lower lobe pneumonia. Streaky opacity left lung base is likely due to atelectasis. Atherosclerosis. Electronically Signed   By: Inge Rise M.D.   On: 03/12/2019 10:19   Dg Chest Portable 1 View  Result Date: 03/19/2019 CLINICAL DATA:  Possible covid EXAM: PORTABLE CHEST 1 VIEW COMPARISON:  March 12, 2019 FINDINGS: The heart size and mediastinal contours are stable. The heart  size is mildly enlarged. Opacity of right lung base is identified worse compared to prior exam. There is linear opacity left lung base probably due to atelectasis. There is no pulmonary edema. The visualized skeletal structures are unremarkable. IMPRESSION: Right lung base pneumonia, worse compared prior exam. Probable atelectasis of left lung base. Electronically Signed   By: Abelardo Diesel M.D.   On: 03/19/2019 21:06       Subjective: She is feeling better today, cough has improved.  Discharge Exam: Vitals:   03/20/19 2307 03/21/19 0815  BP: 131/70 129/72  Pulse: 93 87  Resp:  18  Temp: 98.3 F (36.8 C) 99.2 F (37.3 C)  SpO2: 96% 93%     General: Pt is alert, awake, not in acute distress Cardiovascular: RRR, S1/S2 +, no rubs, no gallops Respiratory: CTA bilaterally, no wheezing, no rhonchi Abdominal: Soft, NT, ND, bowel sounds + Extremities: no edema, no cyanosis    The results of significant diagnostics from this hospitalization (including imaging, microbiology, ancillary and laboratory) are listed below for reference.     Microbiology: Recent Results (from the past 240 hour(s))  Novel Coronavirus,  NAA ([facility])     Status: None   Collection Time: 03/13/19  8:47 AM  Result Value Ref Range Status   SARS-CoV-2, NAA Not Detected Not Detected Final    Comment: Testing was performed using the Aptima SARS-CoV-2 assay. This test was developed and its performance characteristics determined by [employer]. This test has not been FDA cleared or approved. This test has been authorized by FDA under an Emergency Use Authorization (EUA). This test is only authorized for the duration of time the declaration that circumstances exist justifying the authorization of the emergency use of in vitro diagnostic tests for detection of SARS-CoV-2 virus and/or diagnosis of COVID-19 infection under section 564(b)(1) of the Act, 21 U.S.C. 710GYI-9(S)(8), unless the authorization is  terminated or revoked sooner. When diagnostic testing is negative, the possibility of a false negative result should be considered in the context of a patient's recent exposures and the presence of clinical signs and symptoms consistent with COVID-19. An individual without symptoms of COVID-19 and who is not shedding SARS-CoV-2 virus would expect to have a negativ e (not detected) result in this assay.   SARS Coronavirus 2 (CEPHEID- Performed in [place] hospital lab), Hosp Order     Status: None   Collection Time: 03/19/19  8:54 PM   Specimen: Nasopharyngeal Swab  Result Value Ref Range Status   SARS Coronavirus 2 NEGATIVE NEGATIVE Final    Comment: (NOTE) If result is NEGATIVE SARS-CoV-2 target nucleic acids are NOT DETECTED. The SARS-CoV-2 RNA is generally detectable in upper and lower  respiratory specimens during the acute phase of infection. The lowest  concentration of SARS-CoV-2 viral copies this assay can detect is 250  copies / mL. A negative result does not preclude SARS-CoV-2 infection  and should not be used as the sole basis for treatment or other  patient management decisions.  A negative result may occur with  improper specimen collection / handling, submission of specimen other  than nasopharyngeal swab, presence of viral mutation(s) within the  areas targeted by this assay, and inadequate number of viral copies  (<250 copies / mL). A negative result must be combined with clinical  observations, patient history, and epidemiological information. If result is POSITIVE SARS-CoV-2 target nucleic acids are DETECTED. The SARS-CoV-2 RNA is generally detectable in upper and lower  respiratory specimens dur ing the acute phase of infection.  Positive  results are indicative of active infection with SARS-CoV-2.  Clinical  correlation with patient history and other diagnostic information is  necessary to determine patient infection status.  Positive results do  not rule  out bacterial infection or co-infection with other viruses. If result is PRESUMPTIVE POSTIVE SARS-CoV-2 nucleic acids MAY BE PRESENT.   A presumptive positive result was obtained on the submitted specimen  and confirmed on repeat testing.  While 2019 novel coronavirus  (SARS-CoV-2) nucleic acids may be present in the submitted sample  additional confirmatory testing may be necessary for epidemiological  and / or clinical management purposes  to differentiate between  SARS-CoV-2 and other Sarbecovirus currently known to infect humans.  If clinically indicated additional testing with an alternate test  methodology [phone]) is advised. The SARS-CoV-2 RNA is generally  detectable in upper and lower respiratory sp ecimens during the acute  phase of infection. The expected result is Negative. Fact Sheet for Patients:  StrictlyIdeas.no Fact Sheet for Healthcare Providers: BankingDealers.co.za This test is not yet approved or cleared by the [place] FDA and has been authorized for detection  and/or diagnosis of SARS-CoV-2 by FDA under an Emergency Use Authorization (EUA).  This EUA will remain in effect (meaning this test can be used) for the duration of the COVID-19 declaration under Section 564(b)(1) of the Act, 21 U.S.C. section 360bbb-3(b)(1), unless the authorization is terminated or revoked sooner. Performed at [facility], [place] [address] [place], [place] [address]   Gastrointestinal Panel by PCR , Stool     Status: None   Collection Time: 03/20/19  9:36 AM   Specimen: STOOL  Result Value Ref Range Status   Campylobacter species NOT DETECTED NOT DETECTED Final   Plesimonas shigelloides NOT DETECTED NOT DETECTED Final   Salmonella species NOT DETECTED NOT DETECTED Final   Yersinia enterocolitica NOT DETECTED NOT DETECTED Final   Vibrio species NOT DETECTED NOT DETECTED Final   Vibrio cholerae NOT DETECTED NOT DETECTED Final    Enteroaggregative E coli (EAEC) NOT DETECTED NOT DETECTED Final   Enteropathogenic E coli (EPEC) NOT DETECTED NOT DETECTED Final   Enterotoxigenic E coli (ETEC) NOT DETECTED NOT DETECTED Final   Shiga like toxin producing E coli (STEC) NOT DETECTED NOT DETECTED Final   Shigella/Enteroinvasive E coli (EIEC) NOT DETECTED NOT DETECTED Final   Cryptosporidium NOT DETECTED NOT DETECTED Final   Cyclospora cayetanensis NOT DETECTED NOT DETECTED Final   Entamoeba histolytica NOT DETECTED NOT DETECTED Final   Giardia lamblia NOT DETECTED NOT DETECTED Final   Adenovirus F40/41 NOT DETECTED NOT DETECTED Final   Astrovirus NOT DETECTED NOT DETECTED Final   Norovirus GI/GII NOT DETECTED NOT DETECTED Final   Rotavirus A NOT DETECTED NOT DETECTED Final   Sapovirus (I, II, IV, and V) NOT DETECTED NOT DETECTED Final    Comment: Performed at [facility], [address] [place], [place] [address]  MRSA PCR Screening     Status: None   Collection Time: 03/20/19 10:17 AM   Specimen: Nasopharyngeal  Result Value Ref Range Status   MRSA by PCR NEGATIVE NEGATIVE Final    Comment:        The GeneXpert MRSA Assay (FDA approved for NASAL specimens only), is one component of a comprehensive MRSA colonization surveillance program. It is not intended to diagnose MRSA infection nor to guide or monitor treatment for MRSA infections. Performed at [facility], [place] [address], [place],  [address]      Labs: BNP (last 3 results) No results for input(s): BNP in the last 8760 hours. Basic Metabolic Panel: Recent Labs  Lab 03/19/19 1822 03/20/19 0500 03/21/19 0427  NA 128* 134* 138  K 4.6 4.3 4.8  CL 98 105 110  CO2 21* 20* 22  GLUCOSE 115* 93 94  BUN 13 12 9   CREATININE 1.41* 1.19* 1.21*  CALCIUM 10.0 9.8 10.1   Liver Function Tests: Recent Labs  Lab 03/19/19 1822 03/20/19 0500  AST 28 20  ALT 26 20  ALKPHOS 92 89  BILITOT 0.4 0.4  PROT 6.8 6.2*  ALBUMIN 2.7* 2.4*    Recent Labs  Lab 03/19/19 1822  LIPASE 23   No results for input(s): AMMONIA in the last 168 hours. CBC: Recent Labs  Lab 03/19/19 1822 03/20/19 0500 03/21/19 0427  WBC 19.9* 14.7* 12.4*  NEUTROABS  --  12.8*  --   HGB 10.9* 10.4* 10.5*  HCT 34.2* 32.1* 33.2*  MCV 83.6 82.5 84.3  PLT 540* 528* 561*   Cardiac Enzymes: No results for input(s): CKTOTAL, CKMB, CKMBINDEX, TROPONINI in the last 168 hours. BNP:  Invalid input(s): POCBNP CBG: No results for input(s): GLUCAP in the last 168 hours. D-Dimer No results for input(s): DDIMER in the last 72 hours. Hgb A1c No results for input(s): HGBA1C in the last 72 hours. Lipid Profile No results for input(s): CHOL, HDL, LDLCALC, TRIG, CHOLHDL, LDLDIRECT in the last 72 hours. Thyroid function studies No results for input(s): TSH, T4TOTAL, T3FREE, THYROIDAB in the last 72 hours.  Invalid input(s): FREET3 Anemia work up No results for input(s): VITAMINB12, FOLATE, FERRITIN, TIBC, IRON, RETICCTPCT in the last 72 hours. Urinalysis    Component Value Date/Time   COLORURINE YELLOW 03/19/2019 1930   APPEARANCEUR CLEAR 03/19/2019 1930   LABSPEC 1.011 03/19/2019 1930   PHURINE 6.0 03/19/2019 1930   GLUCOSEU NEGATIVE 03/19/2019 1930   HGBUR NEGATIVE 03/19/2019 1930   BILIRUBINUR NEGATIVE 03/19/2019 1930   KETONESUR NEGATIVE 03/19/2019 1930   PROTEINUR NEGATIVE 03/19/2019 1930   UROBILINOGEN 0.2 10/20/2013 0954   NITRITE NEGATIVE 03/19/2019 1930   LEUKOCYTESUR NEGATIVE 03/19/2019 1930   Sepsis Labs Invalid input(s): PROCALCITONIN,  WBC,  LACTICIDVEN Microbiology Recent Results (from the past 240 hour(s))  Novel Coronavirus, NAA ([facility])     Status: None   Collection Time: 03/13/19  8:47 AM  Result Value Ref Range Status   SARS-CoV-2, NAA Not Detected Not Detected Final    Comment: Testing was performed using the Aptima SARS-CoV-2 assay. This test was developed and its performance characteristics determined by [employer]. This test has not been FDA cleared or approved. This test has been authorized by FDA under an Emergency Use Authorization (EUA). This test is only authorized for the duration of time the declaration that circumstances exist justifying the authorization of the emergency use of in vitro diagnostic tests for detection of SARS-CoV-2 virus and/or diagnosis of COVID-19 infection under section 564(b)(1) of the Act, 21 U.S.C. 245YKD-9(I)(3), unless the authorization is terminated or revoked sooner. When diagnostic testing is negative, the possibility of a false negative result should be considered in the context of a patient's recent exposures and the presence of clinical signs and symptoms consistent with COVID-19. An individual without symptoms of COVID-19 and who is not shedding SARS-CoV-2 virus would expect to have a negativ e (not detected) result in this assay.   SARS Coronavirus 2 (CEPHEID- Performed in [place] hospital lab), Hosp Order     Status: None   Collection Time: 03/19/19  8:54 PM   Specimen: Nasopharyngeal Swab  Result Value Ref Range Status   SARS Coronavirus 2 NEGATIVE NEGATIVE Final    Comment: (NOTE) If result is NEGATIVE SARS-CoV-2 target nucleic acids are NOT DETECTED. The SARS-CoV-2 RNA is generally detectable in upper and lower  respiratory specimens during the acute phase of infection. The lowest  concentration of SARS-CoV-2 viral copies this assay can detect is 250  copies / mL. A negative result does not preclude SARS-CoV-2 infection  and should not be used as the sole basis for treatment or other  patient management decisions.  A negative result may occur with  improper specimen collection / handling, submission of specimen other  than nasopharyngeal swab, presence of viral mutation(s) within the  areas targeted by this assay, and inadequate number of viral copies  (<250 copies / mL). A negative result must be combined with clinical   observations, patient history, and epidemiological information. If result is POSITIVE SARS-CoV-2 target nucleic acids are DETECTED. The SARS-CoV-2 RNA is generally detectable in upper and lower  respiratory specimens dur ing the acute phase of  infection.  Positive  results are indicative of active infection with SARS-CoV-2.  Clinical  correlation with patient history and other diagnostic information is  necessary to determine patient infection status.  Positive results do  not rule out bacterial infection or co-infection with other viruses. If result is PRESUMPTIVE POSTIVE SARS-CoV-2 nucleic acids MAY BE PRESENT.   A presumptive positive result was obtained on the submitted specimen  and confirmed on repeat testing.  While 2019 novel coronavirus  (SARS-CoV-2) nucleic acids may be present in the submitted sample  additional confirmatory testing may be necessary for epidemiological  and / or clinical management purposes  to differentiate between  SARS-CoV-2 and other Sarbecovirus currently known to infect humans.  If clinically indicated additional testing with an alternate test  methodology [phone]) is advised. The SARS-CoV-2 RNA is generally  detectable in upper and lower respiratory sp ecimens during the acute  phase of infection. The expected result is Negative. Fact Sheet for Patients:  StrictlyIdeas.no Fact Sheet for Healthcare Providers: BankingDealers.co.za This test is not yet approved or cleared by the [place] FDA and has been authorized for detection and/or diagnosis of SARS-CoV-2 by FDA under an Emergency Use Authorization (EUA).  This EUA will remain in effect (meaning this test can be used) for the duration of the COVID-19 declaration under Section 564(b)(1) of the Act, 21 U.S.C. section 360bbb-3(b)(1), unless the authorization is terminated or revoked sooner. Performed at [facility], [place] [address]  [place], [place] [address]   Gastrointestinal Panel by PCR , Stool     Status: None   Collection Time: 03/20/19  9:36 AM   Specimen: STOOL  Result Value Ref Range Status   Campylobacter species NOT DETECTED NOT DETECTED Final   Plesimonas shigelloides NOT DETECTED NOT DETECTED Final   Salmonella species NOT DETECTED NOT DETECTED Final   Yersinia enterocolitica NOT DETECTED NOT DETECTED Final   Vibrio species NOT DETECTED NOT DETECTED Final   Vibrio cholerae NOT DETECTED NOT DETECTED Final   Enteroaggregative E coli (EAEC) NOT DETECTED NOT DETECTED Final   Enteropathogenic E coli (EPEC) NOT DETECTED NOT DETECTED Final   Enterotoxigenic E coli (ETEC) NOT DETECTED NOT DETECTED Final   Shiga like toxin producing E coli (STEC) NOT DETECTED NOT DETECTED Final   Shigella/Enteroinvasive E coli (EIEC) NOT DETECTED NOT DETECTED Final   Cryptosporidium NOT DETECTED NOT DETECTED Final   Cyclospora cayetanensis NOT DETECTED NOT DETECTED Final   Entamoeba histolytica NOT DETECTED NOT DETECTED Final   Giardia lamblia NOT DETECTED NOT DETECTED Final   Adenovirus F40/41 NOT DETECTED NOT DETECTED Final   Astrovirus NOT DETECTED NOT DETECTED Final   Norovirus GI/GII NOT DETECTED NOT DETECTED Final   Rotavirus A NOT DETECTED NOT DETECTED Final   Sapovirus (I, II, IV, and V) NOT DETECTED NOT DETECTED Final    Comment: Performed at [facility], [address] [place], [place] [address]  MRSA PCR Screening     Status: None   Collection Time: 03/20/19 10:17 AM   Specimen: Nasopharyngeal  Result Value Ref Range Status   MRSA by PCR NEGATIVE NEGATIVE Final    Comment:        The GeneXpert MRSA Assay (FDA approved for NASAL specimens only), is one component of a comprehensive MRSA colonization surveillance program. It is not intended to diagnose MRSA infection nor to guide or monitor treatment for MRSA infections. Performed at [facility], [place] [address], [place], [place] [address]  Time coordinating discharge: 40 minutes  SIGNED:   Elmarie Shiley, MD  [facility]

## 2019-03-24 ENCOUNTER — Telehealth: Payer: Self-pay

## 2019-03-24 ENCOUNTER — Other Ambulatory Visit: Payer: Self-pay

## 2019-03-24 DIAGNOSIS — J189 Pneumonia, unspecified organism: Secondary | ICD-10-CM

## 2019-03-24 DIAGNOSIS — E0500 Thyrotoxicosis with diffuse goiter without thyrotoxic crisis or storm: Secondary | ICD-10-CM | POA: Diagnosis not present

## 2019-03-24 NOTE — Telephone Encounter (Signed)
Left message for pt to call back to schedule hfu and do tcm call

## 2019-04-03 ENCOUNTER — Other Ambulatory Visit: Payer: Self-pay

## 2019-04-03 ENCOUNTER — Ambulatory Visit (INDEPENDENT_AMBULATORY_CARE_PROVIDER_SITE_OTHER): Payer: MEDICARE | Admitting: Internal Medicine

## 2019-04-03 ENCOUNTER — Encounter: Payer: Self-pay | Admitting: Internal Medicine

## 2019-04-03 VITALS — BP 122/82 | HR 93 | Temp 98.1°F

## 2019-04-03 DIAGNOSIS — J181 Lobar pneumonia, unspecified organism: Secondary | ICD-10-CM | POA: Diagnosis not present

## 2019-04-03 DIAGNOSIS — R509 Fever, unspecified: Secondary | ICD-10-CM | POA: Diagnosis not present

## 2019-04-03 DIAGNOSIS — L989 Disorder of the skin and subcutaneous tissue, unspecified: Secondary | ICD-10-CM | POA: Diagnosis not present

## 2019-04-03 DIAGNOSIS — Z940 Kidney transplant status: Secondary | ICD-10-CM

## 2019-04-03 DIAGNOSIS — J189 Pneumonia, unspecified organism: Secondary | ICD-10-CM

## 2019-04-03 MED ORDER — CEFTRIAXONE SODIUM 500 MG IJ SOLR
500.0000 mg | Freq: Once | INTRAMUSCULAR | Status: AC
  Administered 2019-04-03: 500 mg via INTRAMUSCULAR

## 2019-04-03 MED ORDER — ALBUTEROL SULFATE HFA 108 (90 BASE) MCG/ACT IN AERS: 2.0000 | INHALATION_SPRAY | Freq: Four times a day (QID) | RESPIRATORY_TRACT | 0 refills | Status: DC | PRN

## 2019-04-03 NOTE — Addendum Note (Signed)
Addended by: Steward Ros on: 04/03/2019 05:29 PM   Modules accepted: Orders

## 2019-04-03 NOTE — Patient Instructions (Signed)
TAKE METAMUCIL 2-3 TIMES A DAY TO HELP BULK UP YOUR STOOL  CALL us TOMORROW AFTER 2 PM IF YOU DONT HERE FROM YOUR CHEST XRAY TO FIND OUT THE RESULTS.   KEEP SORES ON YOUR BUTTOCKS WASHED WITH SOAP AND WATER, AND COVERED.   I DID VIRAL AND BACTERIAL CULTURE ON THE SORES  USE THE INHALER EVERY 4-6 HOURS FOR COUGH AND WHEEZING AND PUSH FLUIDS.  IF YOU GET WORSE TONIGHT, FO TO ED.

## 2019-04-03 NOTE — Progress Notes (Signed)
Subjective:     Patient ID: Yvette Jenkins , female    DOB: Sep 02, 1956 , 63 y.o.   MRN: [id number]   Chief Complaint  Patient presents with  . Cough    HPI  Pt is here for FU RLL pneumonia diagnosed on 03/08/2019. She was admitted on 7/15 and discharged 03/21/2019. She was discharged with Levaquin and has finished all of it 2 days ago. Has een having fevers of 100.7 this am and on the weekend had 100.4, yesterday was 99.9 with tylenol in her system. Around 4 am she wakes up with coughing fits and HA and takes another dose of tylenol. Her cough is productive with redish orange color x 4 days, where at the hospital was white, and at home had been yellow. She was not given any inhalers or nebs for home. Has been taking cough med.  The diarrhea has not resolved, goes about 4 times a day. Had negative stool studies while at the hospital. .  Past Medical History:  Diagnosis Date  . Acquired renal cyst of right kidney   . Anemia associated with chronic renal failure    IV Iron infusion   . Arthritis   . Coronary artery disease    per pt cardiac cath at [facility] 10-26-2015  non-obstructive cad of one vessel  . End-stage renal disease on peritoneal dialysis [facility])    nephrologist-  dr Justin Mend [facility])  daily dialysis start 7-8pm to 7-8am-- currently on kidney transplant list at [facility]  . Gross hematuria   . History of Graves' disease   . Hypertension   . Hypothyroidism, postradioiodine therapy   . Kidney transplant candidate    on waiting list at [facility]  . Left nephrolithiasis    non-obstructive per pt   . PONV (postoperative nausea and vomiting)   . Secondary hyperparathyroidism of renal origin [facility])      Family History  Problem Relation Age of Onset  . Hypertension Other   . Diabetes Other   . Renal Disease Mother   . Heart attack Father      Current Outpatient Medications:  .  acetaminophen (TYLENOL) 500 MG tablet, Take 500 mg by mouth every 8 (eight) hours as needed for  mild pain, fever or headache. , Disp: , Rfl:  .  amLODipine (NORVASC) 10 MG tablet, Take 5 mg by mouth daily with supper. Patient takes 1/2 of 10mg  tablet (5mg ), Disp: , Rfl:  .  atorvastatin (LIPITOR) 10 MG tablet, Take 10 mg by mouth daily at 6 PM. , Disp: , Rfl:  .  benzonatate (TESSALON) 100 MG capsule, Take 1 capsule (100 mg total) by mouth every 8 (eight) hours., Disp: 21 capsule, Rfl: 0 .  guaiFENesin-dextromethorphan (ROBITUSSIN DM) 100-10 MG/5ML syrup, Take 5 mLs by mouth every 4 (four) hours as needed for cough., Disp: 118 mL, Rfl: 0 .  mycophenolate (CELLCEPT) 250 MG capsule, Take 500 mg by mouth 2 (two) times daily. 9am and 9pm, Disp: , Rfl:  .  predniSONE (DELTASONE) 5 MG tablet, Take 5 mg by mouth daily with breakfast. Patient takes at 9am, Disp: , Rfl:  .  SYNTHROID 150 MCG tablet, Take 1 tablet (150 mcg total) by mouth daily before breakfast. Take 1 tablet by mouth Mon- Fri and 1/2 tab on Sun (Patient taking differently: Take 150 mcg by mouth daily before breakfast. Take  1 tablet by mouth Monday - Friday, none on Saturday and Sunday), Disp: 90 tablet, Rfl: 0 .  tacrolimus (PROGRAF)  1 MG capsule, Take 3 mg by mouth 2 (two) times daily. 9am and 9pm, Disp: , Rfl:    Allergies  Allergen Reactions  . Aspirin Hives  . Tetracyclines & Related Hives and Other (See Comments)    GI intolerance -"Messes with stomach"     Review of Systems  + cough with pink thick mucous, no blood seen, no body aches, has been wheezing when she lays down, no CP or SOB. Has wound on R buttocks x 3 days. Mildly sore.  Today's Vitals   04/03/19 1605  BP: 122/82  Pulse: 93  Temp: 98.1 F (36.7 C)  TempSrc: Oral   There is no height or weight on file to calculate BMI.   Objective:  Physical Exam Vitals signs and nursing note reviewed.  Constitutional:      Appearance: She is ill-appearing. She is not toxic-appearing or diaphoretic.  HENT:     Head: Normocephalic.     Right Ear: External ear  normal.     Left Ear: External ear normal.  Eyes:     General: No scleral icterus.    Conjunctiva/sclera: Conjunctivae normal.  Neck:     Musculoskeletal: Neck supple.  Cardiovascular:     Rate and Rhythm: Normal rate and regular rhythm.  Pulmonary:     Effort: Pulmonary effort is normal.     Breath sounds: Wheezing present.     Comments: Has mild scattered wheezing, no crackles heard. Musculoskeletal: Normal range of motion.  Lymphadenopathy:     Cervical: No cervical adenopathy.  Skin:    General: Skin is warm.     Findings: Lesion present.     Comments: Has 3 clusters of skin color 4x4 cm raised lesion that looked like doughnut shape, and when I opened it for a culture with sterile 15G needle small amt of white matter come out. No erythema or induration of this area noted.   Neurological:     Mental Status: She is alert and oriented to person, place, and time.  Psychiatric:        Mood and Affect: Mood normal.        Behavior: Behavior normal.        Thought Content: Thought content normal.     Assessment And Plan:   1. Pneumonia of left lower lobe due to infectious organism [facility])- unresolved. I p;aced her on Proair inhaler 2 puffs q 4h x 7 days.  - cefTRIAXone (ROCEPHIN) injection 500 mg - DG Chest 2 View; Future - Novel Coronavirus, NAA ([facility]) - Herpes Culture  We will call her tomorrow with results. If she does not hear from [place] by 2 pm she needs to call us.  2. Fever, unspecified fever cause- recurrent.  - Novel Coronavirus, NAA ([facility])   CBC ordered 3. Skin lesions - Culture, Wound and viral culture ordered.  4- Hx of renal transplant- CMP ordered.   We will inform her when the results come.      Sadiq Mccauley RODRIGUEZ-SOUTHWORTH, PA-C    THE PATIENT IS ENCOURAGED TO PRACTICE SOCIAL DISTANCING DUE TO THE COVID-19 PANDEMIC.

## 2019-04-04 ENCOUNTER — Ambulatory Visit
Admission: RE | Admit: 2019-04-04 | Discharge: 2019-04-04 | Disposition: A | Discharge status: Home / Self Care | Payer: MEDICARE | Source: Ambulatory Visit | Attending: Internal Medicine | Admitting: Internal Medicine

## 2019-04-04 DIAGNOSIS — J189 Pneumonia, unspecified organism: Secondary | ICD-10-CM | POA: Diagnosis not present

## 2019-04-05 LAB — HERPES SIMPLEX VIRUS CULTURE

## 2019-04-05 LAB — WOUND CULTURE: Organism ID, Bacteria: NONE SEEN

## 2019-04-06 LAB — CBC WITH DIFFERENTIAL/PLATELET
Basophils Absolute: 0.8 10*3/uL — ABNORMAL HIGH (ref 0.0–0.2)
Basos: 5 %
EOS (ABSOLUTE): 0 10*3/uL (ref 0.0–0.4)
Eos: 0 %
Hematocrit: 34.1 % (ref 34.0–46.6)
Hemoglobin: 11 g/dL — ABNORMAL LOW (ref 11.1–15.9)
Lymphocytes Absolute: 6.4 10*3/uL — ABNORMAL HIGH (ref 0.7–3.1)
Lymphs: 42 %
MCH: 26.6 pg (ref 26.6–33.0)
MCHC: 32.3 g/dL (ref 31.5–35.7)
MCV: 83 fL (ref 79–97)
Monocytes Absolute: 1.4 10*3/uL — ABNORMAL HIGH (ref 0.1–0.9)
Monocytes: 9 %
Neutrophils Absolute: 6.7 10*3/uL (ref 1.4–7.0)
Neutrophils: 44 %
Platelets: 430 10*3/uL (ref 150–450)
RBC: 4.13 x10E6/uL (ref 3.77–5.28)
RDW: 13.6 % (ref 11.7–15.4)
WBC: 15.3 10*3/uL — ABNORMAL HIGH (ref 3.4–10.8)

## 2019-04-06 LAB — CMP14+EGFR
ALT: 7 IU/L (ref 0–32)
AST: 14 IU/L (ref 0–40)
Albumin/Globulin Ratio: 1.4 (ref 1.2–2.2)
Albumin: 3.8 g/dL (ref 3.8–4.8)
Alkaline Phosphatase: 108 IU/L (ref 39–117)
BUN/Creatinine Ratio: 12 (ref 12–28)
BUN: 17 mg/dL (ref 8–27)
Bilirubin Total: <0.2 mg/dL (ref 0.0–1.2)
CO2: 20 mmol/L (ref 20–29)
Calcium: 9.6 mg/dL (ref 8.7–10.3)
Chloride: 101 mmol/L (ref 96–106)
Creatinine, Ser: 1.37 mg/dL — ABNORMAL HIGH (ref 0.57–1.00)
GFR calc Af Amer: 48 mL/min/{1.73_m2} — ABNORMAL LOW (ref >59)
GFR calc non Af Amer: 41 mL/min/{1.73_m2} — ABNORMAL LOW (ref >59)
Globulin, Total: 2.8 g/dL (ref 1.5–4.5)
Glucose: 113 mg/dL — ABNORMAL HIGH (ref 65–99)
Potassium: 5.4 mmol/L — ABNORMAL HIGH (ref 3.5–5.2)
Sodium: 138 mmol/L (ref 134–144)
Total Protein: 6.6 g/dL (ref 6.0–8.5)

## 2019-04-06 LAB — NOVEL CORONAVIRUS, NAA: SARS-CoV-2, NAA: NOT DETECTED

## 2019-04-07 ENCOUNTER — Telehealth: Payer: Self-pay

## 2019-04-08 ENCOUNTER — Encounter: Payer: Self-pay | Admitting: Internal Medicine

## 2019-04-08 ENCOUNTER — Telehealth: Payer: Self-pay

## 2019-04-08 ENCOUNTER — Other Ambulatory Visit: Payer: Self-pay

## 2019-04-08 DIAGNOSIS — J189 Pneumonia, unspecified organism: Secondary | ICD-10-CM

## 2019-04-08 MED ORDER — AMOXICILLIN-POT CLAVULANATE 875-125 MG PO TABS: 1.0000 | ORAL_TABLET | Freq: Two times a day (BID) | ORAL | 0 refills | Status: AC

## 2019-04-08 NOTE — Telephone Encounter (Signed)
Spoke with pt about lab results.

## 2019-04-08 NOTE — Telephone Encounter (Signed)
Called to inform pt that rx had been sent for augmentin

## 2019-04-09 DIAGNOSIS — Z7952 Long term (current) use of systemic steroids: Secondary | ICD-10-CM | POA: Diagnosis not present

## 2019-04-09 DIAGNOSIS — E875 Hyperkalemia: Secondary | ICD-10-CM | POA: Diagnosis not present

## 2019-04-09 DIAGNOSIS — Z886 Allergy status to analgesic agent status: Secondary | ICD-10-CM | POA: Diagnosis not present

## 2019-04-09 DIAGNOSIS — J984 Other disorders of lung: Secondary | ICD-10-CM | POA: Insufficient documentation

## 2019-04-09 DIAGNOSIS — J189 Pneumonia, unspecified organism: Secondary | ICD-10-CM | POA: Diagnosis not present

## 2019-04-09 DIAGNOSIS — A430 Pulmonary nocardiosis: Secondary | ICD-10-CM | POA: Diagnosis present

## 2019-04-09 DIAGNOSIS — Z9181 History of falling: Secondary | ICD-10-CM | POA: Diagnosis not present

## 2019-04-09 DIAGNOSIS — Z20828 Contact with and (suspected) exposure to other viral communicable diseases: Secondary | ICD-10-CM | POA: Diagnosis present

## 2019-04-09 DIAGNOSIS — N186 End stage renal disease: Secondary | ICD-10-CM | POA: Diagnosis not present

## 2019-04-09 DIAGNOSIS — I10 Essential (primary) hypertension: Secondary | ICD-10-CM | POA: Diagnosis present

## 2019-04-09 DIAGNOSIS — E785 Hyperlipidemia, unspecified: Secondary | ICD-10-CM | POA: Diagnosis not present

## 2019-04-09 DIAGNOSIS — B029 Zoster without complications: Secondary | ICD-10-CM | POA: Diagnosis present

## 2019-04-09 DIAGNOSIS — G43109 Migraine with aura, not intractable, without status migrainosus: Secondary | ICD-10-CM | POA: Diagnosis not present

## 2019-04-09 DIAGNOSIS — Z119 Encounter for screening for infectious and parasitic diseases, unspecified: Secondary | ICD-10-CM | POA: Diagnosis not present

## 2019-04-09 DIAGNOSIS — A609 Anogenital herpesviral infection, unspecified: Secondary | ICD-10-CM | POA: Diagnosis not present

## 2019-04-09 DIAGNOSIS — Z881 Allergy status to other antibiotic agents status: Secondary | ICD-10-CM | POA: Diagnosis not present

## 2019-04-09 DIAGNOSIS — Z452 Encounter for adjustment and management of vascular access device: Secondary | ICD-10-CM | POA: Diagnosis not present

## 2019-04-09 DIAGNOSIS — I120 Hypertensive chronic kidney disease with stage 5 chronic kidney disease or end stage renal disease: Secondary | ICD-10-CM | POA: Diagnosis not present

## 2019-04-09 DIAGNOSIS — Z940 Kidney transplant status: Secondary | ICD-10-CM | POA: Diagnosis not present

## 2019-04-09 DIAGNOSIS — R05 Cough: Secondary | ICD-10-CM | POA: Diagnosis not present

## 2019-04-09 DIAGNOSIS — N183 Chronic kidney disease, stage 3 (moderate): Secondary | ICD-10-CM | POA: Diagnosis not present

## 2019-04-09 DIAGNOSIS — Z880 Allergy status to penicillin: Secondary | ICD-10-CM | POA: Diagnosis not present

## 2019-04-09 DIAGNOSIS — E0590 Thyrotoxicosis, unspecified without thyrotoxic crisis or storm: Secondary | ICD-10-CM | POA: Diagnosis not present

## 2019-04-09 DIAGNOSIS — R591 Generalized enlarged lymph nodes: Secondary | ICD-10-CM | POA: Diagnosis present

## 2019-04-09 DIAGNOSIS — R509 Fever, unspecified: Secondary | ICD-10-CM | POA: Diagnosis not present

## 2019-04-09 DIAGNOSIS — Z7901 Long term (current) use of anticoagulants: Secondary | ICD-10-CM | POA: Diagnosis not present

## 2019-04-09 DIAGNOSIS — A09 Infectious gastroenteritis and colitis, unspecified: Secondary | ICD-10-CM | POA: Diagnosis not present

## 2019-04-09 DIAGNOSIS — R042 Hemoptysis: Secondary | ICD-10-CM | POA: Diagnosis present

## 2019-04-09 DIAGNOSIS — D899 Disorder involving the immune mechanism, unspecified: Secondary | ICD-10-CM | POA: Diagnosis not present

## 2019-04-09 DIAGNOSIS — L989 Disorder of the skin and subcutaneous tissue, unspecified: Secondary | ICD-10-CM | POA: Diagnosis present

## 2019-04-09 DIAGNOSIS — J219 Acute bronchiolitis, unspecified: Secondary | ICD-10-CM | POA: Diagnosis not present

## 2019-04-09 DIAGNOSIS — D631 Anemia in chronic kidney disease: Secondary | ICD-10-CM | POA: Diagnosis not present

## 2019-04-09 DIAGNOSIS — R5381 Other malaise: Secondary | ICD-10-CM | POA: Diagnosis present

## 2019-04-09 DIAGNOSIS — A419 Sepsis, unspecified organism: Secondary | ICD-10-CM | POA: Diagnosis not present

## 2019-04-09 DIAGNOSIS — G44209 Tension-type headache, unspecified, not intractable: Secondary | ICD-10-CM | POA: Diagnosis not present

## 2019-04-09 DIAGNOSIS — R918 Other nonspecific abnormal finding of lung field: Secondary | ICD-10-CM | POA: Diagnosis not present

## 2019-04-09 DIAGNOSIS — Z79899 Other long term (current) drug therapy: Secondary | ICD-10-CM | POA: Diagnosis not present

## 2019-04-09 DIAGNOSIS — R0989 Other specified symptoms and signs involving the circulatory and respiratory systems: Secondary | ICD-10-CM | POA: Diagnosis not present

## 2019-04-09 DIAGNOSIS — Z03818 Encounter for observation for suspected exposure to other biological agents ruled out: Secondary | ICD-10-CM | POA: Diagnosis not present

## 2019-04-09 DIAGNOSIS — E039 Hypothyroidism, unspecified: Secondary | ICD-10-CM | POA: Diagnosis not present

## 2019-04-09 DIAGNOSIS — J188 Other pneumonia, unspecified organism: Secondary | ICD-10-CM | POA: Diagnosis not present

## 2019-04-09 DIAGNOSIS — R51 Headache: Secondary | ICD-10-CM | POA: Diagnosis not present

## 2019-04-09 DIAGNOSIS — A4189 Other specified sepsis: Secondary | ICD-10-CM | POA: Diagnosis not present

## 2019-04-10 ENCOUNTER — Ambulatory Visit: Payer: MEDICARE | Admitting: Internal Medicine

## 2019-04-10 ENCOUNTER — Other Ambulatory Visit: Payer: Self-pay | Admitting: Internal Medicine

## 2019-04-10 DIAGNOSIS — J188 Other pneumonia, unspecified organism: Secondary | ICD-10-CM | POA: Diagnosis not present

## 2019-04-10 DIAGNOSIS — R918 Other nonspecific abnormal finding of lung field: Secondary | ICD-10-CM | POA: Diagnosis not present

## 2019-04-10 DIAGNOSIS — J189 Pneumonia, unspecified organism: Secondary | ICD-10-CM

## 2019-04-10 NOTE — Progress Notes (Signed)
CT chest ordered.

## 2019-04-12 DIAGNOSIS — A430 Pulmonary nocardiosis: Secondary | ICD-10-CM | POA: Insufficient documentation

## 2019-04-12 DIAGNOSIS — Z940 Kidney transplant status: Secondary | ICD-10-CM | POA: Diagnosis not present

## 2019-04-12 DIAGNOSIS — J984 Other disorders of lung: Secondary | ICD-10-CM | POA: Diagnosis not present

## 2019-04-18 DIAGNOSIS — N186 End stage renal disease: Secondary | ICD-10-CM | POA: Diagnosis not present

## 2019-04-18 DIAGNOSIS — E039 Hypothyroidism, unspecified: Secondary | ICD-10-CM

## 2019-04-18 DIAGNOSIS — D631 Anemia in chronic kidney disease: Secondary | ICD-10-CM | POA: Diagnosis not present

## 2019-04-18 DIAGNOSIS — I120 Hypertensive chronic kidney disease with stage 5 chronic kidney disease or end stage renal disease: Secondary | ICD-10-CM | POA: Diagnosis not present

## 2019-04-18 DIAGNOSIS — A430 Pulmonary nocardiosis: Secondary | ICD-10-CM | POA: Diagnosis not present

## 2019-04-18 DIAGNOSIS — Z940 Kidney transplant status: Secondary | ICD-10-CM

## 2019-04-18 DIAGNOSIS — Z7901 Long term (current) use of anticoagulants: Secondary | ICD-10-CM

## 2019-04-18 DIAGNOSIS — Z452 Encounter for adjustment and management of vascular access device: Secondary | ICD-10-CM

## 2019-04-18 DIAGNOSIS — E785 Hyperlipidemia, unspecified: Secondary | ICD-10-CM

## 2019-04-18 DIAGNOSIS — Z9181 History of falling: Secondary | ICD-10-CM

## 2019-04-21 ENCOUNTER — Other Ambulatory Visit: Payer: Self-pay

## 2019-04-21 MED ORDER — ALBUTEROL SULFATE HFA 108 (90 BASE) MCG/ACT IN AERS: 2.0000 | INHALATION_SPRAY | Freq: Four times a day (QID) | RESPIRATORY_TRACT | 0 refills | Status: DC | PRN

## 2019-04-23 DIAGNOSIS — A430 Pulmonary nocardiosis: Secondary | ICD-10-CM | POA: Diagnosis not present

## 2019-04-23 DIAGNOSIS — N186 End stage renal disease: Secondary | ICD-10-CM | POA: Diagnosis not present

## 2019-04-23 DIAGNOSIS — I120 Hypertensive chronic kidney disease with stage 5 chronic kidney disease or end stage renal disease: Secondary | ICD-10-CM | POA: Diagnosis not present

## 2019-04-23 DIAGNOSIS — E785 Hyperlipidemia, unspecified: Secondary | ICD-10-CM | POA: Diagnosis not present

## 2019-04-23 DIAGNOSIS — D631 Anemia in chronic kidney disease: Secondary | ICD-10-CM | POA: Diagnosis not present

## 2019-04-23 DIAGNOSIS — E039 Hypothyroidism, unspecified: Secondary | ICD-10-CM | POA: Diagnosis not present

## 2019-04-29 DIAGNOSIS — I120 Hypertensive chronic kidney disease with stage 5 chronic kidney disease or end stage renal disease: Secondary | ICD-10-CM | POA: Diagnosis not present

## 2019-04-29 DIAGNOSIS — E785 Hyperlipidemia, unspecified: Secondary | ICD-10-CM | POA: Diagnosis not present

## 2019-04-29 DIAGNOSIS — A430 Pulmonary nocardiosis: Secondary | ICD-10-CM | POA: Diagnosis not present

## 2019-04-29 DIAGNOSIS — D631 Anemia in chronic kidney disease: Secondary | ICD-10-CM | POA: Diagnosis not present

## 2019-04-29 DIAGNOSIS — N186 End stage renal disease: Secondary | ICD-10-CM | POA: Diagnosis not present

## 2019-04-29 DIAGNOSIS — E039 Hypothyroidism, unspecified: Secondary | ICD-10-CM | POA: Diagnosis not present

## 2019-05-05 DIAGNOSIS — J853 Abscess of mediastinum: Secondary | ICD-10-CM | POA: Diagnosis not present

## 2019-05-05 DIAGNOSIS — Z7952 Long term (current) use of systemic steroids: Secondary | ICD-10-CM | POA: Diagnosis not present

## 2019-05-05 DIAGNOSIS — I10 Essential (primary) hypertension: Secondary | ICD-10-CM | POA: Diagnosis not present

## 2019-05-05 DIAGNOSIS — Z940 Kidney transplant status: Secondary | ICD-10-CM | POA: Diagnosis not present

## 2019-05-05 DIAGNOSIS — N186 End stage renal disease: Secondary | ICD-10-CM | POA: Diagnosis not present

## 2019-05-05 DIAGNOSIS — A430 Pulmonary nocardiosis: Secondary | ICD-10-CM | POA: Diagnosis not present

## 2019-05-05 DIAGNOSIS — F329 Major depressive disorder, single episode, unspecified: Secondary | ICD-10-CM | POA: Diagnosis not present

## 2019-05-05 DIAGNOSIS — Z4822 Encounter for aftercare following kidney transplant: Secondary | ICD-10-CM | POA: Diagnosis not present

## 2019-05-05 DIAGNOSIS — N183 Chronic kidney disease, stage 3 (moderate): Secondary | ICD-10-CM | POA: Diagnosis not present

## 2019-05-05 DIAGNOSIS — D899 Disorder involving the immune mechanism, unspecified: Secondary | ICD-10-CM | POA: Diagnosis not present

## 2019-05-05 DIAGNOSIS — Z79899 Other long term (current) drug therapy: Secondary | ICD-10-CM | POA: Diagnosis not present

## 2019-05-05 DIAGNOSIS — I120 Hypertensive chronic kidney disease with stage 5 chronic kidney disease or end stage renal disease: Secondary | ICD-10-CM | POA: Diagnosis not present

## 2019-05-05 DIAGNOSIS — E0590 Thyrotoxicosis, unspecified without thyrotoxic crisis or storm: Secondary | ICD-10-CM | POA: Diagnosis not present

## 2019-05-05 DIAGNOSIS — R110 Nausea: Secondary | ICD-10-CM | POA: Diagnosis not present

## 2019-05-05 DIAGNOSIS — R112 Nausea with vomiting, unspecified: Secondary | ICD-10-CM | POA: Diagnosis not present

## 2019-05-06 ENCOUNTER — Other Ambulatory Visit: Payer: Self-pay

## 2019-05-06 MED ORDER — ALBUTEROL SULFATE HFA 108 (90 BASE) MCG/ACT IN AERS: 2.0000 | INHALATION_SPRAY | Freq: Four times a day (QID) | RESPIRATORY_TRACT | 0 refills | Status: DC | PRN

## 2019-05-12 DIAGNOSIS — A430 Pulmonary nocardiosis: Secondary | ICD-10-CM | POA: Diagnosis not present

## 2019-05-12 DIAGNOSIS — I120 Hypertensive chronic kidney disease with stage 5 chronic kidney disease or end stage renal disease: Secondary | ICD-10-CM | POA: Diagnosis not present

## 2019-05-12 DIAGNOSIS — E785 Hyperlipidemia, unspecified: Secondary | ICD-10-CM | POA: Diagnosis not present

## 2019-05-12 DIAGNOSIS — N186 End stage renal disease: Secondary | ICD-10-CM | POA: Diagnosis not present

## 2019-05-12 DIAGNOSIS — E039 Hypothyroidism, unspecified: Secondary | ICD-10-CM | POA: Diagnosis not present

## 2019-05-12 DIAGNOSIS — D631 Anemia in chronic kidney disease: Secondary | ICD-10-CM | POA: Diagnosis not present

## 2019-05-18 DIAGNOSIS — I120 Hypertensive chronic kidney disease with stage 5 chronic kidney disease or end stage renal disease: Secondary | ICD-10-CM | POA: Diagnosis not present

## 2019-05-18 DIAGNOSIS — Z940 Kidney transplant status: Secondary | ICD-10-CM | POA: Diagnosis not present

## 2019-05-18 DIAGNOSIS — D631 Anemia in chronic kidney disease: Secondary | ICD-10-CM | POA: Diagnosis not present

## 2019-05-18 DIAGNOSIS — Z7901 Long term (current) use of anticoagulants: Secondary | ICD-10-CM | POA: Diagnosis not present

## 2019-05-18 DIAGNOSIS — N186 End stage renal disease: Secondary | ICD-10-CM | POA: Diagnosis not present

## 2019-05-18 DIAGNOSIS — Z9181 History of falling: Secondary | ICD-10-CM | POA: Diagnosis not present

## 2019-05-18 DIAGNOSIS — Z452 Encounter for adjustment and management of vascular access device: Secondary | ICD-10-CM | POA: Diagnosis not present

## 2019-05-18 DIAGNOSIS — E039 Hypothyroidism, unspecified: Secondary | ICD-10-CM | POA: Diagnosis not present

## 2019-05-18 DIAGNOSIS — A430 Pulmonary nocardiosis: Secondary | ICD-10-CM | POA: Diagnosis not present

## 2019-05-18 DIAGNOSIS — E785 Hyperlipidemia, unspecified: Secondary | ICD-10-CM | POA: Diagnosis not present

## 2019-05-19 DIAGNOSIS — D631 Anemia in chronic kidney disease: Secondary | ICD-10-CM | POA: Diagnosis not present

## 2019-05-19 DIAGNOSIS — E785 Hyperlipidemia, unspecified: Secondary | ICD-10-CM | POA: Diagnosis not present

## 2019-05-19 DIAGNOSIS — E039 Hypothyroidism, unspecified: Secondary | ICD-10-CM | POA: Diagnosis not present

## 2019-05-19 DIAGNOSIS — Z940 Kidney transplant status: Secondary | ICD-10-CM | POA: Diagnosis not present

## 2019-05-19 DIAGNOSIS — Z79899 Other long term (current) drug therapy: Secondary | ICD-10-CM | POA: Diagnosis not present

## 2019-05-19 DIAGNOSIS — D899 Disorder involving the immune mechanism, unspecified: Secondary | ICD-10-CM | POA: Diagnosis not present

## 2019-05-19 DIAGNOSIS — N183 Chronic kidney disease, stage 3 (moderate): Secondary | ICD-10-CM | POA: Diagnosis not present

## 2019-05-19 DIAGNOSIS — A430 Pulmonary nocardiosis: Secondary | ICD-10-CM | POA: Diagnosis not present

## 2019-05-19 DIAGNOSIS — I120 Hypertensive chronic kidney disease with stage 5 chronic kidney disease or end stage renal disease: Secondary | ICD-10-CM | POA: Diagnosis not present

## 2019-05-19 DIAGNOSIS — N186 End stage renal disease: Secondary | ICD-10-CM | POA: Diagnosis not present

## 2019-05-26 DIAGNOSIS — D631 Anemia in chronic kidney disease: Secondary | ICD-10-CM | POA: Diagnosis not present

## 2019-05-26 DIAGNOSIS — A430 Pulmonary nocardiosis: Secondary | ICD-10-CM | POA: Diagnosis not present

## 2019-05-26 DIAGNOSIS — E039 Hypothyroidism, unspecified: Secondary | ICD-10-CM | POA: Diagnosis not present

## 2019-05-26 DIAGNOSIS — N186 End stage renal disease: Secondary | ICD-10-CM | POA: Diagnosis not present

## 2019-05-26 DIAGNOSIS — I120 Hypertensive chronic kidney disease with stage 5 chronic kidney disease or end stage renal disease: Secondary | ICD-10-CM | POA: Diagnosis not present

## 2019-05-26 DIAGNOSIS — E785 Hyperlipidemia, unspecified: Secondary | ICD-10-CM | POA: Diagnosis not present

## 2019-06-02 DIAGNOSIS — E039 Hypothyroidism, unspecified: Secondary | ICD-10-CM | POA: Diagnosis not present

## 2019-06-02 DIAGNOSIS — A430 Pulmonary nocardiosis: Secondary | ICD-10-CM | POA: Diagnosis not present

## 2019-06-02 DIAGNOSIS — D631 Anemia in chronic kidney disease: Secondary | ICD-10-CM | POA: Diagnosis not present

## 2019-06-02 DIAGNOSIS — I120 Hypertensive chronic kidney disease with stage 5 chronic kidney disease or end stage renal disease: Secondary | ICD-10-CM | POA: Diagnosis not present

## 2019-06-02 DIAGNOSIS — E785 Hyperlipidemia, unspecified: Secondary | ICD-10-CM | POA: Diagnosis not present

## 2019-06-02 DIAGNOSIS — N186 End stage renal disease: Secondary | ICD-10-CM | POA: Diagnosis not present

## 2019-06-06 DIAGNOSIS — B259 Cytomegaloviral disease, unspecified: Secondary | ICD-10-CM | POA: Diagnosis not present

## 2019-06-06 DIAGNOSIS — Z940 Kidney transplant status: Secondary | ICD-10-CM | POA: Diagnosis not present

## 2019-06-06 DIAGNOSIS — Z5181 Encounter for therapeutic drug level monitoring: Secondary | ICD-10-CM | POA: Diagnosis not present

## 2019-06-06 DIAGNOSIS — B349 Viral infection, unspecified: Secondary | ICD-10-CM | POA: Diagnosis not present

## 2019-06-13 DIAGNOSIS — Z940 Kidney transplant status: Secondary | ICD-10-CM | POA: Diagnosis not present

## 2019-06-13 DIAGNOSIS — A430 Pulmonary nocardiosis: Secondary | ICD-10-CM | POA: Diagnosis not present

## 2019-06-13 DIAGNOSIS — R6889 Other general symptoms and signs: Secondary | ICD-10-CM | POA: Diagnosis not present

## 2019-06-18 DIAGNOSIS — Z940 Kidney transplant status: Secondary | ICD-10-CM | POA: Diagnosis not present

## 2019-06-25 ENCOUNTER — Other Ambulatory Visit: Payer: Self-pay

## 2019-06-25 ENCOUNTER — Encounter: Payer: Self-pay | Admitting: Internal Medicine

## 2019-06-25 ENCOUNTER — Ambulatory Visit (INDEPENDENT_AMBULATORY_CARE_PROVIDER_SITE_OTHER): Payer: MEDICARE | Admitting: Internal Medicine

## 2019-06-25 ENCOUNTER — Ambulatory Visit: Payer: MEDICARE

## 2019-06-25 VITALS — BP 142/90 | HR 90 | Temp 97.2°F | Ht 63.0 in | Wt 168.2 lb

## 2019-06-25 DIAGNOSIS — I129 Hypertensive chronic kidney disease with stage 1 through stage 4 chronic kidney disease, or unspecified chronic kidney disease: Secondary | ICD-10-CM | POA: Diagnosis not present

## 2019-06-25 DIAGNOSIS — Z6829 Body mass index (BMI) 29.0-29.9, adult: Secondary | ICD-10-CM

## 2019-06-25 DIAGNOSIS — E039 Hypothyroidism, unspecified: Secondary | ICD-10-CM | POA: Diagnosis not present

## 2019-06-25 DIAGNOSIS — R635 Abnormal weight gain: Secondary | ICD-10-CM

## 2019-06-25 DIAGNOSIS — Z0000 Encounter for general adult medical examination without abnormal findings: Secondary | ICD-10-CM | POA: Diagnosis not present

## 2019-06-25 DIAGNOSIS — Z940 Kidney transplant status: Secondary | ICD-10-CM

## 2019-06-25 DIAGNOSIS — E663 Overweight: Secondary | ICD-10-CM

## 2019-06-25 DIAGNOSIS — Z1231 Encounter for screening mammogram for malignant neoplasm of breast: Secondary | ICD-10-CM

## 2019-06-25 DIAGNOSIS — N183 Chronic kidney disease, stage 3 unspecified: Secondary | ICD-10-CM | POA: Diagnosis not present

## 2019-06-25 DIAGNOSIS — E7800 Pure hypercholesterolemia, unspecified: Secondary | ICD-10-CM | POA: Diagnosis not present

## 2019-06-25 LAB — POCT URINALYSIS DIPSTICK
Bilirubin, UA: NEGATIVE
Blood, UA: NEGATIVE
Glucose, UA: NEGATIVE
Ketones, UA: NEGATIVE
Leukocytes, UA: NEGATIVE
Nitrite, UA: NEGATIVE
Protein, UA: NEGATIVE
Spec Grav, UA: 1.02 (ref 1.010–1.025)
Urobilinogen, UA: 0.2 E.U./dL
pH, UA: 5.5 (ref 5.0–8.0)

## 2019-06-25 LAB — POCT UA - MICROALBUMIN
Albumin/Creatinine Ratio, Urine, POC: <30
Creatinine, POC: 200 mg/dL
Microalbumin Ur, POC: 30 mg/L

## 2019-06-25 NOTE — Patient Instructions (Addendum)
  Yvette Jenkins , Thank you for taking time to come for your Medicare Wellness Visit. I appreciate your ongoing commitment to your health goals. Please review the following plan we discussed and let me know if I can assist you in the future.   These are the goals we discussed: Goals    . Planning to write a book (pt-stated)    . Weight (lb) < 200 lb (90.7 kg)       This is a list of the screening recommended for you and due dates:  Health Maintenance  Topic Date Due  . Pap Smear  06/09/2018  . Flu Shot  12/03/2019*  . Mammogram  09/01/2019  . Colon Cancer Screening  05/05/2021  . Tetanus Vaccine  08/07/2023  .  Hepatitis C: One time screening is recommended by Center for Disease Control  (CDC) for  adults born from [birth year] through 1965.   Completed  . HIV Screening  Completed  *Topic was postponed. The date shown is not the original due date.

## 2019-06-26 ENCOUNTER — Encounter: Payer: Self-pay | Admitting: Internal Medicine

## 2019-06-26 LAB — LIPID PANEL
Chol/HDL Ratio: 4.1 ratio (ref 0.0–4.4)
Cholesterol, Total: 203 mg/dL — ABNORMAL HIGH (ref 100–199)
HDL: 50 mg/dL (ref >39)
LDL Chol Calc (NIH): 132 mg/dL — ABNORMAL HIGH (ref 0–99)
Triglycerides: 119 mg/dL (ref 0–149)
VLDL Cholesterol Cal: 21 mg/dL (ref 5–40)

## 2019-07-01 ENCOUNTER — Encounter: Payer: Self-pay | Admitting: Internal Medicine

## 2019-07-01 NOTE — Progress Notes (Addendum)
Subjective:    Yvette Jenkins is a 63 y.o. female who presents for a Welcome to Medicare exam.   She is here today for her annual wellness visit. She has no specific concerns at this time.   Hypertension This is a chronic problem. The current episode started more than 1 month ago. The problem has been gradually improving since onset. The problem is controlled. Pertinent negatives include no blurred vision, chest pain, palpitations or shortness of breath. Risk factors for coronary artery disease include post-menopausal state. The current treatment provides moderate improvement. Hypertensive end-organ damage includes kidney disease.     Review of Systems  Review of Systems  Constitutional: Negative.        She c/o weight gain. She denies change in her eating habits.   HENT: Negative.   Eyes: Negative.  Negative for blurred vision.  Respiratory: Negative.  Negative for shortness of breath.   Cardiovascular: Negative.  Negative for chest pain and palpitations.  Gastrointestinal: Negative.   Genitourinary: Negative.   Musculoskeletal: Negative.   Skin: Negative.   Neurological: Negative.   Endo/Heme/Allergies: Negative.   Psychiatric/Behavioral: Negative.    Cardiac Risk Factors include: Other (see comment), Risk factor comments: Kidney transplan      Objective:    Today's Vitals   06/25/19 1428  BP: (!) 142/90  Pulse: 90  Temp: (!) 97.2 F (36.2 C)  TempSrc: Oral  Weight: 168 lb 3.2 oz (76.3 kg)  Height: 5\' 3"  (1.6 m)  PainSc: 0-No pain  Body mass index is 29.8 kg/m.  Medications Outpatient Encounter Medications as of 06/25/2019  Medication Sig   acetaminophen (TYLENOL) 500 MG tablet Take 500 mg by mouth every 8 (eight) hours as needed for mild pain, fever or headache.    albuterol (VENTOLIN HFA) 108 (90 Base) MCG/ACT inhaler Inhale 2 puffs into the lungs every 6 (six) hours as needed for wheezing or shortness of breath (q 4h for wheezing and cough x 7 days).  Please educate how to use   amLODipine (NORVASC) 5 MG tablet Take 5 mg by mouth daily.   atorvastatin (LIPITOR) 10 MG tablet Take by mouth.   azithromycin (ZITHROMAX) 250 MG tablet Take 250 mg by mouth once.    Cholecalciferol (VITAMIN D3) 125 MCG (5000 UT) CAPS Take by mouth.   Cyanocobalamin (VITAMIN B12) 1000 MCG TBCR Take by mouth.   mycophenolate (CELLCEPT) 250 MG capsule Take 250 mg by mouth 2 (two) times daily. 9am and 9pm   predniSONE (DELTASONE) 5 MG tablet Take 5 mg by mouth daily with breakfast. Patient takes at [place] Take by mouth. 1/2 tab   sulfamethoxazole-trimethoprim (BACTRIM DS) 800-160 MG tablet TK 1 T PO Q 8 H   SYNTHROID 150 MCG tablet Take 1 tablet (150 mcg total) by mouth daily before breakfast. Take  1 tablet by mouth Monday - Friday, none on Saturday and Sunday   Tacrolimus ER (ENVARSUS XR) 1 MG TB24 Take by mouth. 3 tab daily   [DISCONTINUED] amLODipine (NORVASC) 10 MG tablet Take 5 mg by mouth daily with supper. Patient takes 1/2 of 10mg  tablet (5mg )   [DISCONTINUED] atorvastatin (LIPITOR) 10 MG tablet Take 10 mg by mouth daily at 6 PM.    [DISCONTINUED] benzonatate (TESSALON) 100 MG capsule Take 1 capsule (100 mg total) by mouth every 8 (eight) hours.   [DISCONTINUED] guaiFENesin-dextromethorphan (ROBITUSSIN DM) 100-10 MG/5ML syrup Take 5 mLs by mouth every 4 (four) hours as needed for cough.   [  DISCONTINUED] tacrolimus (PROGRAF) 1 MG capsule Take 3 mg by mouth 2 (two) times daily. 9am and 9pm   No facility-administered encounter medications on file as of 06/25/2019.      History: Past Medical History:  Diagnosis Date   Acquired renal cyst of right kidney    Anemia associated with chronic renal failure    IV Iron infusion    Arthritis    Coronary artery disease    per pt cardiac cath at [facility] 10-26-2015  non-obstructive cad of one vessel   End-stage renal disease on peritoneal dialysis [facility])    nephrologist-  dr Justin Mend  [facility])  daily dialysis start 7-8pm to 7-8am-- [facility] on [facility] list at [facility]   Gross hematuria    History of Graves' disease    Hypertension    Hypothyroidism, postradioiodine therapy    Kidney transplant candidate    on waiting list at [facility]   Left nephrolithiasis    non-obstructive per pt    PONV (postoperative nausea and vomiting)    Secondary hyperparathyroidism of renal origin [facility])    Past Surgical History:  Procedure Laterality Date   CARDIAC CATHETERIZATION  11-17-2015   [facility]- dr Marcello Moores povsic   abnormal stress test w/ ischemia (received from nephrologist dr Justin Mend office) non-obstrucitve CAD- diffuse mid to distal LAD 60% and insignificant RCA,  stress test does not really match up with this lesion and this lesion is likely to remain insignificant   CARDIOVASCULAR STRESS TEST  10/15/2015   Intermediate risk nuclear study w/ moderate size and intensity, basal to mid inferior wall reversible perfusion defect suggestive of ischemia/  ef 57%,  inferior and inferoseptal hypokinesis to dyskinesis   CESAREAN SECTION  1987  and 1976   CYSTOSCOPY W/ RETROGRADES Bilateral 07/11/2016   Procedure: CYSTOSCOPY WITH RETROGRADE PYELOGRAM;  Surgeon: Nickie Retort, MD;  Location: [facility];  Service: Urology;  Laterality: Bilateral;   CYSTOSCOPY WITH BIOPSY Left 07/11/2016   Procedure: CYSTOSCOPY WITH  LEFT RENAL PELVIS BIOPSY;  Surgeon: Nickie Retort, MD;  Location: [facility];  Service: Urology;  Laterality: Left;   CYSTOSCOPY WITH STENT PLACEMENT Left 07/11/2016   Procedure: CYSTOSCOPY WITH STENT PLACEMENT;  Surgeon: Nickie Retort, MD;  Location: [facility];  Service: Urology;  Laterality: Left;   CYSTOSCOPY WITH URETEROSCOPY Left 07/11/2016   Procedure: CYSTOSCOPY WITH URETEROSCOPY;  Surgeon: Nickie Retort, MD;  Location: [facility];  Service: Urology;  Laterality: Left;     INSERTION OF DIALYSIS CATHETER  06-23-2015  at [facility]   Tenckhoff coiled peritoneal catheter   KIDNEY TRANSPLANT N/A 05/13/2018   Added a kidney to the bladder   TRANSTHORACIC ECHOCARDIOGRAM  10/15/2015   poor acoustic windows limit study-  ef 50% with akinesis of the basal posterior, inferior , and lateral walls,  grade 1 diastolic dysfunction/;   WRIST ARTHROSCOPY W/ TRIANGULAR FIBROCARTILAGE REPAIR Right 03/10/2010    Family History  Problem Relation Age of Onset   Hypertension Other    Diabetes Other    Renal Disease Mother    Heart attack Father    Social History   Occupational History   Occupation: Child care  Tobacco Use   Smoking status: Never Smoker   Smokeless tobacco: Never Used  Substance and Sexual Activity   Alcohol use: No   Drug use: No   Sexual activity: Not Currently    Tobacco Counseling Counseling given: Not Answered   Immunizations  and Health Maintenance  There is no immunization history on file for this patient. Health Maintenance Due  Topic Date Due   PAP SMEAR-Modifier  06/09/2018    Activities of Daily Living In your present state of health, do you have any difficulty performing the following activities: 06/25/2019 06/25/2019  Hearing? N N  Vision? N N  Difficulty concentrating or making decisions? N Y  Walking or climbing stairs? N N  Dressing or bathing? N N  Doing errands, shopping? - Scientist, forensic and eating ? N -  Using the Toilet? N -  In the past six months, have you accidently leaked urine? N -  Do you have problems with loss of bowel control? N -  Managing your Medications? N -  Managing your Finances? N -  Housekeeping or managing your Housekeeping? N -  Some recent data might be hidden    Physical Exam  Today's Vitals   06/25/19 1428  BP: (!) 142/90  Pulse: 90  Temp: (!) 97.2 F (36.2 C)  TempSrc: Oral  Weight: 168 lb 3.2 oz (76.3 kg)  Height: 5\' 3"  (1.6 m)  PainSc: 0-No pain   Body  mass index is 29.8 kg/m.  Physical Exam  Constitutional: She is oriented to person, place, and time and well-developed, well-nourished, and in no distress.  HENT:  Head: Normocephalic and atraumatic.  Right Ear: External ear normal.  Left Ear: External ear normal.  Eyes: Pupils are equal, round, and reactive to light. Conjunctivae and EOM are normal.  Neck: Neck supple.  Cardiovascular: Normal rate, regular rhythm and normal heart sounds.  Pulmonary/Chest: Effort normal and breath sounds normal.  Abdominal: Soft.  Genitourinary:    Genitourinary Comments: deferred   Musculoskeletal: Normal range of motion.  Neurological: She is alert and oriented to person, place, and time. GCS score is 15.  Skin: Skin is warm and dry.  Psychiatric: Affect normal.  Nursing note and vitals reviewed.   Advanced Directives: Does Patient Have a Medical Advance Directive?: Yes Type of Advance Directive: [place] Does patient want to make changes to medical advance directive?: Yes (ED - Information included in AVS) Would patient like information on creating a medical advance directive?: Yes (ED - Information included in AVS)    Assessment:    This is a routine wellness examination for this patient .  Vision/Hearing screen No exam data present  Dietary issues and exercise activities discussed:  Current Exercise Habits: Home exercise routine, Type of exercise: treadmill, Time (Minutes): 10, Frequency (Times/Week): 4, Weekly Exercise (Minutes/Week): 40, Intensity: Mild, Exercise limited by: Other - see comments(Kidney transplant)  Goals     Planning to write a book (pt-stated)     Weight (lb) < 200 lb (90.7 kg)      Depression Screen PHQ 2/9 Scores 07/03/2019 04/03/2019 03/19/2019 06/19/2018  PHQ - 2 Score 0 0 0 0     Fall Risk Fall Risk  07/03/2019  Falls in the past year? 0  Number falls in past yr: 0  Injury with Fall? 0  Risk for fall due to : -    Cognitive  Function:     6CIT Screen 06/25/2019 06/19/2018  What Year? 0 points 0 points  What month? 0 points 0 points  What time? 0 points 0 points  Count back from 20 0 points 0 points  Months in reverse 0 points 0 points  Repeat phrase 0 points 0 points  Total Score 0 0  Patient Care Team: Glendale Chard, MD as PCP - General (Internal Medicine) [place], [place], [place] (Optometry) Edrick Oh, MD as Consulting Physician (Nephrology)     Plan:    1. Encounter for Medicare annual wellness exam  The annual wellness visit was performed including discussion of advanced directives, assessment of functional status and cognitive function. PATIENT HAS BEEN ADVISED TO GET 30-45 MINUTES REGULAR EXERCISE NO LESS THAN FOUR TO FIVE DAYS PER WEEK - BOTH WEIGHTBEARING EXERCISES AND AEROBIC ARE RECOMMENDED.  SHE WAS ADVISED TO FOLLOW A HEALTHY DIET WITH AT LEAST SIX FRUITS/VEGGIES PER DAY, DECREASE INTAKE OF RED MEAT, AND TO INCREASE FISH INTAKE TO TWO DAYS PER WEEK.  MEATS/FISH SHOULD NOT BE FRIED, BAKED OR BROILED IS PREFERABLE.  I SUGGEST WEARING SPF 50 SUNSCREEN ON EXPOSED PARTS AND ESPECIALLY WHEN IN THE DIRECT SUNLIGHT FOR AN EXTENDED PERIOD OF TIME.  PLEASE AVOID FAST FOOD RESTAURANTS AND INCREASE YOUR WATER INTAKE.  2. Hypertensive nephropathy  Fair control. She will continue with current meds. She is encouraged to avoid adding salt to her foods.   - POCT Urinalysis Dipstick (81002) - POCT UA - Microalbumin - Lipid panel  3. Stage 3 chronic kidney disease, unspecified whether stage 3a or 3b CKD  Chronic. Her labs from [facility] were reviewed in full detail. She is encouraged to stay well hydrated. She is s/p renal transplant.   4. Primary hypothyroidism  She recently had labwork performed by [facility], these were reviewed in full detail. There is no adjustment needed at this time.   5. Pure hypercholesterolemia  I will check non-fasting lipid panel today. She is encouraged to take meds as prescribed,  avoid fried foods and to increase her weekly exercise. She is encouraged to strive for 123min weekly.    6. Weight gain  She thinks she has gained 6 - 10 pounds in the past six months. She is advised to exercise 150 minutes per week and to avoid sugary beverages.   7. Overweight with body mass index (BMI) of 29 to 29.9 in adult  Importance of achieving optimal weight to decrease risk of cardiovascular disease and cancers was discussed with the patient in full detail. She is encouraged to start slowly - start with 10 minutes twice daily at least three to four days per week and to gradually build to 30 minutes five days weekly. She was given tips to incorporate more activity into her daily routine - take stairs when possible, park farther away from grocery stores, etc.    8. History of renal transplant  9. Breast cancer screening by mammogram  I will refer her to GYN for annual mammogram. Last study was in 2018.   I have personally reviewed and noted the following in the patients chart:    Medical and social history  Use of alcohol, tobacco or illicit drugs   Current medications and supplements  Functional ability and status  Nutritional status  Physical activity  Advanced directives  List of other physicians  Hospitalizations, surgeries, and ER visits in previous 12 months  Vitals  Screenings to include cognitive, depression, and falls  Referrals and appointments  In addition, I have reviewed and discussed with patient certain preventive protocols, quality metrics, and best practice recommendations. A written personalized care plan for preventive services as well as general preventive health recommendations were provided to patient.     Maximino Greenland, MD 07/03/2019

## 2019-07-02 DIAGNOSIS — Z940 Kidney transplant status: Secondary | ICD-10-CM | POA: Diagnosis not present

## 2019-07-07 ENCOUNTER — Other Ambulatory Visit: Payer: Self-pay | Admitting: Internal Medicine

## 2019-07-07 DIAGNOSIS — Z1231 Encounter for screening mammogram for malignant neoplasm of breast: Secondary | ICD-10-CM

## 2019-07-08 ENCOUNTER — Encounter: Payer: Self-pay | Admitting: Internal Medicine

## 2019-07-08 ENCOUNTER — Telehealth: Payer: Self-pay

## 2019-07-08 NOTE — Telephone Encounter (Signed)
The pt called and said that she wanted Dr. Baird Cancer to know that she has an appt with [facility] Du for a pap smear and that Dr. Baird Cancer wanted to know the pt's last GFR and the pt said it was 37 and done at [place] done on 07/02/2019.

## 2019-07-09 DIAGNOSIS — H40013 Open angle with borderline findings, low risk, bilateral: Secondary | ICD-10-CM | POA: Diagnosis not present

## 2019-07-14 DIAGNOSIS — R918 Other nonspecific abnormal finding of lung field: Secondary | ICD-10-CM | POA: Diagnosis not present

## 2019-07-14 DIAGNOSIS — Z940 Kidney transplant status: Secondary | ICD-10-CM | POA: Diagnosis not present

## 2019-07-14 DIAGNOSIS — A430 Pulmonary nocardiosis: Secondary | ICD-10-CM | POA: Diagnosis not present

## 2019-07-14 DIAGNOSIS — T8619 Other complication of kidney transplant: Secondary | ICD-10-CM | POA: Diagnosis not present

## 2019-07-14 DIAGNOSIS — D849 Immunodeficiency, unspecified: Secondary | ICD-10-CM | POA: Diagnosis not present

## 2019-07-14 DIAGNOSIS — A439 Nocardiosis, unspecified: Secondary | ICD-10-CM | POA: Diagnosis not present

## 2019-07-14 DIAGNOSIS — B259 Cytomegaloviral disease, unspecified: Secondary | ICD-10-CM | POA: Diagnosis not present

## 2019-07-14 DIAGNOSIS — Z79899 Other long term (current) drug therapy: Secondary | ICD-10-CM | POA: Diagnosis not present

## 2019-07-14 DIAGNOSIS — Z23 Encounter for immunization: Secondary | ICD-10-CM | POA: Diagnosis not present

## 2019-07-28 ENCOUNTER — Other Ambulatory Visit: Payer: Self-pay

## 2019-07-28 ENCOUNTER — Ambulatory Visit
Admission: RE | Admit: 2019-07-28 | Discharge: 2019-07-28 | Disposition: A | Discharge status: Home / Self Care | Payer: MEDICARE | Source: Ambulatory Visit | Attending: Internal Medicine | Admitting: Internal Medicine

## 2019-07-28 DIAGNOSIS — Z1231 Encounter for screening mammogram for malignant neoplasm of breast: Secondary | ICD-10-CM

## 2019-07-30 DIAGNOSIS — Z01419 Encounter for gynecological examination (general) (routine) without abnormal findings: Secondary | ICD-10-CM | POA: Diagnosis not present

## 2019-07-30 DIAGNOSIS — Z1211 Encounter for screening for malignant neoplasm of colon: Secondary | ICD-10-CM | POA: Diagnosis not present

## 2019-07-30 DIAGNOSIS — Z124 Encounter for screening for malignant neoplasm of cervix: Secondary | ICD-10-CM | POA: Diagnosis not present

## 2019-08-06 DIAGNOSIS — Z940 Kidney transplant status: Secondary | ICD-10-CM | POA: Diagnosis not present

## 2019-08-18 DIAGNOSIS — Z940 Kidney transplant status: Secondary | ICD-10-CM | POA: Diagnosis not present

## 2019-10-06 DIAGNOSIS — Z792 Long term (current) use of antibiotics: Secondary | ICD-10-CM | POA: Diagnosis not present

## 2019-10-06 DIAGNOSIS — E785 Hyperlipidemia, unspecified: Secondary | ICD-10-CM | POA: Diagnosis not present

## 2019-10-06 DIAGNOSIS — R918 Other nonspecific abnormal finding of lung field: Secondary | ICD-10-CM | POA: Diagnosis not present

## 2019-10-06 DIAGNOSIS — D849 Immunodeficiency, unspecified: Secondary | ICD-10-CM | POA: Diagnosis not present

## 2019-10-06 DIAGNOSIS — A430 Pulmonary nocardiosis: Secondary | ICD-10-CM | POA: Diagnosis not present

## 2019-10-06 DIAGNOSIS — Z6841 Body Mass Index (BMI) 40.0 and over, adult: Secondary | ICD-10-CM | POA: Diagnosis not present

## 2019-10-06 DIAGNOSIS — N1832 Chronic kidney disease, stage 3b: Secondary | ICD-10-CM | POA: Diagnosis not present

## 2019-10-06 DIAGNOSIS — Z4822 Encounter for aftercare following kidney transplant: Secondary | ICD-10-CM | POA: Diagnosis not present

## 2019-10-06 DIAGNOSIS — I10 Essential (primary) hypertension: Secondary | ICD-10-CM | POA: Diagnosis not present

## 2019-10-06 DIAGNOSIS — E669 Obesity, unspecified: Secondary | ICD-10-CM | POA: Diagnosis not present

## 2019-10-06 DIAGNOSIS — Z940 Kidney transplant status: Secondary | ICD-10-CM | POA: Diagnosis not present

## 2019-10-06 DIAGNOSIS — I120 Hypertensive chronic kidney disease with stage 5 chronic kidney disease or end stage renal disease: Secondary | ICD-10-CM | POA: Diagnosis not present

## 2019-10-06 DIAGNOSIS — J986 Disorders of diaphragm: Secondary | ICD-10-CM | POA: Diagnosis not present

## 2019-10-06 DIAGNOSIS — K069 Disorder of gingiva and edentulous alveolar ridge, unspecified: Secondary | ICD-10-CM | POA: Diagnosis not present

## 2019-10-06 DIAGNOSIS — N186 End stage renal disease: Secondary | ICD-10-CM | POA: Diagnosis not present

## 2019-10-06 DIAGNOSIS — R05 Cough: Secondary | ICD-10-CM | POA: Diagnosis not present

## 2019-10-06 DIAGNOSIS — Z79899 Other long term (current) drug therapy: Secondary | ICD-10-CM | POA: Diagnosis not present

## 2019-10-06 DIAGNOSIS — E039 Hypothyroidism, unspecified: Secondary | ICD-10-CM | POA: Diagnosis not present

## 2019-10-06 DIAGNOSIS — J189 Pneumonia, unspecified organism: Secondary | ICD-10-CM | POA: Diagnosis not present

## 2019-10-06 DIAGNOSIS — R911 Solitary pulmonary nodule: Secondary | ICD-10-CM | POA: Diagnosis not present

## 2019-10-22 ENCOUNTER — Other Ambulatory Visit: Payer: Self-pay

## 2019-10-22 DIAGNOSIS — K117 Disturbances of salivary secretion: Secondary | ICD-10-CM | POA: Diagnosis not present

## 2019-10-22 DIAGNOSIS — Z940 Kidney transplant status: Secondary | ICD-10-CM | POA: Diagnosis not present

## 2019-10-22 DIAGNOSIS — K0262 Dental caries on smooth surface penetrating into dentin: Secondary | ICD-10-CM | POA: Diagnosis not present

## 2019-10-22 MED ORDER — SYNTHROID 150 MCG PO TABS: 150.0000 ug | ORAL_TABLET | Freq: Every day | ORAL | 1 refills | Status: DC

## 2019-10-22 NOTE — Telephone Encounter (Signed)
I left a message that the pt's synthroid dose will stay the same and that her refill has been faxed to her pharmacy.

## 2019-10-24 DIAGNOSIS — Z940 Kidney transplant status: Secondary | ICD-10-CM | POA: Diagnosis not present

## 2019-11-25 DIAGNOSIS — Z940 Kidney transplant status: Secondary | ICD-10-CM | POA: Diagnosis not present

## 2019-12-12 DIAGNOSIS — Z940 Kidney transplant status: Secondary | ICD-10-CM | POA: Diagnosis not present

## 2019-12-24 ENCOUNTER — Other Ambulatory Visit: Payer: Self-pay

## 2019-12-24 ENCOUNTER — Encounter: Payer: Self-pay | Admitting: Internal Medicine

## 2019-12-24 ENCOUNTER — Ambulatory Visit (INDEPENDENT_AMBULATORY_CARE_PROVIDER_SITE_OTHER): Payer: MEDICARE

## 2019-12-24 ENCOUNTER — Ambulatory Visit (INDEPENDENT_AMBULATORY_CARE_PROVIDER_SITE_OTHER): Payer: MEDICARE | Admitting: Internal Medicine

## 2019-12-24 VITALS — BP 122/80 | HR 93 | Temp 97.8°F | Ht 61.0 in | Wt 177.0 lb

## 2019-12-24 VITALS — BP 122/80 | HR 93 | Temp 97.8°F | Ht 61.0 in | Wt 177.6 lb

## 2019-12-24 DIAGNOSIS — Z0000 Encounter for general adult medical examination without abnormal findings: Secondary | ICD-10-CM

## 2019-12-24 DIAGNOSIS — M2550 Pain in unspecified joint: Secondary | ICD-10-CM | POA: Diagnosis not present

## 2019-12-24 DIAGNOSIS — N183 Chronic kidney disease, stage 3 unspecified: Secondary | ICD-10-CM | POA: Diagnosis not present

## 2019-12-24 DIAGNOSIS — R202 Paresthesia of skin: Secondary | ICD-10-CM

## 2019-12-24 DIAGNOSIS — M25541 Pain in joints of right hand: Secondary | ICD-10-CM | POA: Diagnosis not present

## 2019-12-24 DIAGNOSIS — M791 Myalgia, unspecified site: Secondary | ICD-10-CM

## 2019-12-24 DIAGNOSIS — I129 Hypertensive chronic kidney disease with stage 1 through stage 4 chronic kidney disease, or unspecified chronic kidney disease: Secondary | ICD-10-CM | POA: Diagnosis not present

## 2019-12-24 NOTE — Patient Instructions (Signed)
Yvette Jenkins , Thank you for taking time to come for your Medicare Wellness Visit. I appreciate your ongoing commitment to your health goals. Please review the following plan we discussed and let me know if I can assist you in the future.   Screening recommendations/referrals: Colonoscopy: 05/2011 Mammogram: 07/2019 Bone Density: not required Recommended yearly ophthalmology/optometry visit for glaucoma screening and checkup Recommended yearly dental visit for hygiene and checkup  Vaccinations: Influenza vaccine: 07/2019 Pneumococcal vaccine: 09/2015 Tdap vaccine: 08/2013 Shingles vaccine: discussed    Advanced directives: Please bring a copy of your POA (Power of [place]) and/or Living Will to your next appointment.   Conditions/risks identified: obesity  Next appointment: 12/30/2020 at 2:00  Preventive Care 40-64 Years, Female Preventive care refers to lifestyle choices and visits with your health care provider that can promote health and wellness. What does preventive care include?  A yearly physical exam. This is also called an annual well check.  Dental exams once or twice a year.  Routine eye exams. Ask your health care provider how often you should have your eyes checked.  Personal lifestyle choices, including:  Daily care of your teeth and gums.  Regular physical activity.  Eating a healthy diet.  Avoiding tobacco and drug use.  Limiting alcohol use.  Practicing safe sex.  Taking low-dose aspirin daily starting at age 73.  Taking vitamin and mineral supplements as recommended by your health care provider. What happens during an annual well check? The services and screenings done by your health care provider during your annual well check will depend on your age, overall health, lifestyle risk factors, and family history of disease. Counseling  Your health care provider may ask you questions about your:  Alcohol use.  Tobacco use.  Drug use.  Emotional  well-being.  Home and relationship well-being.  Sexual activity.  Eating habits.  Work and work Statistician.  Method of birth control.  Menstrual cycle.  Pregnancy history. Screening  You may have the following tests or measurements:  Height, weight, and BMI.  Blood pressure.  Lipid and cholesterol levels. These may be checked every 5 years, or more frequently if you are over 4 years old.  Skin check.  Lung cancer screening. You may have this screening every year starting at age 74 if you have a 30-pack-year history of smoking and currently smoke or have quit within the past 15 years.  Fecal occult blood test (FOBT) of the stool. You may have this test every year starting at age 64.  Flexible sigmoidoscopy or colonoscopy. You may have a sigmoidoscopy every 5 years or a colonoscopy every 10 years starting at age 67.  Hepatitis C blood test.  Hepatitis B blood test.  Sexually transmitted disease (STD) testing.  Diabetes screening. This is done by checking your blood sugar (glucose) after you have not eaten for a while (fasting). You may have this done every 1-3 years.  Mammogram. This may be done every 1-2 years. Talk to your health care provider about when you should start having regular mammograms. This may depend on whether you have a family history of breast cancer.  BRCA-related cancer screening. This may be done if you have a family history of breast, ovarian, tubal, or peritoneal cancers.  Pelvic exam and Pap test. This may be done every 3 years starting at age 30. Starting at age 3, this may be done every 5 years if you have a Pap test in combination with an HPV test.  Bone density scan.  This is done to screen for osteoporosis. You may have this scan if you are at high risk for osteoporosis. Discuss your test results, treatment options, and if necessary, the need for more tests with your health care provider. Vaccines  Your health care provider may recommend  certain vaccines, such as:  Influenza vaccine. This is recommended every year.  Tetanus, diphtheria, and acellular pertussis (Tdap, Td) vaccine. You may need a Td booster every 10 years.  Zoster vaccine. You may need this after age 26.  Pneumococcal 13-valent conjugate (PCV13) vaccine. You may need this if you have certain conditions and were not previously vaccinated.  Pneumococcal polysaccharide (PPSV23) vaccine. You may need one or two doses if you smoke cigarettes or if you have certain conditions. Talk to your health care provider about which screenings and vaccines you need and how often you need them. This information is not intended to replace advice given to you by your health care provider. Make sure you discuss any questions you have with your health care provider. Document Released: 09/17/2015 Document Revised: 05/10/2016 Document Reviewed: 06/22/2015 Elsevier Interactive Patient Education  2017 [place] Prevention in the Home Falls can cause injuries. They can happen to people of all ages. There are many things you can do to make your home safe and to help prevent falls. What can I do on the outside of my home?  Regularly fix the edges of walkways and driveways and fix any cracks.  Remove anything that might make you trip as you walk through a door, such as a raised step or threshold.  Trim any bushes or trees on the path to your home.  Use bright outdoor lighting.  Clear any walking paths of anything that might make someone trip, such as rocks or tools.  Regularly check to see if handrails are loose or broken. Make sure that both sides of any steps have handrails.  Any raised decks and porches should have guardrails on the edges.  Have any leaves, snow, or ice cleared regularly.  Use sand or salt on walking paths during winter.  Clean up any spills in your garage right away. This includes oil or grease spills. What can I do in the bathroom?  Use  night lights.  Install grab bars by the toilet and in the tub and shower. Do not use towel bars as grab bars.  Use non-skid mats or decals in the tub or shower.  If you need to sit down in the shower, use a plastic, non-slip stool.  Keep the floor dry. Clean up any water that spills on the floor as soon as it happens.  Remove soap buildup in the tub or shower regularly.  Attach bath mats securely with double-sided non-slip rug tape.  Do not have throw rugs and other things on the floor that can make you trip. What can I do in the bedroom?  Use night lights.  Make sure that you have a light by your bed that is easy to reach.  Do not use any sheets or blankets that are too big for your bed. They should not hang down onto the floor.  Have a firm chair that has side arms. You can use this for support while you get dressed.  Do not have throw rugs and other things on the floor that can make you trip. What can I do in the kitchen?  Clean up any spills right away.  Avoid walking on wet floors.  Keep items that you use a lot in easy-to-reach places.  If you need to reach something above you, use a strong step stool that has a grab bar.  Keep electrical cords out of the way.  Do not use floor polish or wax that makes floors slippery. If you must use wax, use non-skid floor wax.  Do not have throw rugs and other things on the floor that can make you trip. What can I do with my stairs?  Do not leave any items on the stairs.  Make sure that there are handrails on both sides of the stairs and use them. Fix handrails that are broken or loose. Make sure that handrails are as long as the stairways.  Check any carpeting to make sure that it is firmly attached to the stairs. Fix any carpet that is loose or worn.  Avoid having throw rugs at the top or bottom of the stairs. If you do have throw rugs, attach them to the floor with carpet tape.  Make sure that you have a light switch at  the top of the stairs and the bottom of the stairs. If you do not have them, ask someone to add them for you. What else can I do to help prevent falls?  Wear shoes that:  Do not have high heels.  Have rubber bottoms.  Are comfortable and fit you well.  Are closed at the toe. Do not wear sandals.  If you use a stepladder:  Make sure that it is fully opened. Do not climb a closed stepladder.  Make sure that both sides of the stepladder are locked into place.  Ask someone to hold it for you, if possible.  Clearly mark and make sure that you can see:  Any grab bars or handrails.  First and last steps.  Where the edge of each step is.  Use tools that help you move around (mobility aids) if they are needed. These include:  Canes.  Walkers.  Scooters.  Crutches.  Turn on the lights when you go into a dark area. Replace any light bulbs as soon as they burn out.  Set up your furniture so you have a clear path. Avoid moving your furniture around.  If any of your floors are uneven, fix them.  If there are any pets around you, be aware of where they are.  Review your medicines with your doctor. Some medicines can make you feel dizzy. This can increase your chance of falling. Ask your doctor what other things that you can do to help prevent falls. This information is not intended to replace advice given to you by your health care provider. Make sure you discuss any questions you have with your health care provider. Document Released: 06/17/2009 Document Revised: 01/27/2016 Document Reviewed: 09/25/2014 Elsevier Interactive Patient Education  2017 [employer].

## 2019-12-24 NOTE — Progress Notes (Signed)
This visit occurred during the SARS-CoV-2 public health emergency.  Safety protocols were in place, including screening questions prior to the visit, additional usage of staff PPE, and extensive cleaning of exam room while observing appropriate contact time as indicated for disinfecting solutions.  Subjective:   Kaedynce Tapp is a 64 y.o. female who presents for Medicare Annual (Subsequent) preventive examination.  Review of Systems:  n/a Cardiac Risk Factors include: obesity (BMI >30kg/m2)     Objective:     Vitals: BP 122/80   Pulse 93   Temp 97.8 F (36.6 C) (Oral)   Ht 5\' 1"  (1.549 m)   Wt 177 lb (80.3 kg)   SpO2 96%   BMI 33.44 kg/m   Body mass index is 33.44 kg/m.  Advanced Directives 12/24/2019 06/25/2019 03/20/2019 03/19/2019 06/19/2018 07/11/2016 06/24/2015  Does Patient Have a Medical Advance Directive? Yes Yes No No Yes No No  Type of Advance Directive Healthcare Power of [place] - -  Does patient want to make changes to medical advance directive? - Yes (ED - Information included in AVS) - - No - Patient declined - -  Copy of [place] in Chart? No - copy requested - - - No - copy requested - -  Would patient like information on creating a medical advance directive? - Yes (ED - Information included in AVS) No - Patient declined - - No - patient declined information No - patient declined information    Tobacco Social History   Tobacco Use  Smoking Status Never Smoker  Smokeless Tobacco Never Used     Counseling given: Not Answered   Clinical Intake:  Pre-visit preparation completed: Yes  Pain : 0-10 Pain Score: 5  Pain Type: Chronic pain Pain Location: Generalized Pain Descriptors / Indicators: Aching, Sharp Pain Onset: More than a month ago Pain Frequency: Constant     Nutritional Status: BMI > 30  Obese Nutritional Risks: None  How often do you need to have  someone help you when you read instructions, pamphlets, or other written materials from your doctor or pharmacy?: 1 - Never What is the last grade level you completed in school?: bachelor's degree  Interpreter Needed?: No  Information entered by :: NAllen L{PN  Past Medical History:  Diagnosis Date  . Acquired renal cyst of right kidney   . Anemia associated with chronic renal failure    IV Iron infusion   . Arthritis   . Coronary artery disease    per pt cardiac cath at [facility] 10-26-2015  non-obstructive cad of one vessel  . End-stage renal disease on peritoneal dialysis [facility])    nephrologist-  dr Justin Mend [facility])  daily dialysis start 7-8pm to 7-8am-- currently on kidney [facility] list [facility]  . Gross hematuria   . History of Graves' disease   . Hypertension   . Hypothyroidism, postradioiodine therapy   . Kidney transplant candidate    on waiting list at [facility]  . Left nephrolithiasis    non-obstructive per pt   . PONV (postoperative nausea and vomiting)   . Secondary hyperparathyroidism of renal origin [facility])    Past Surgical History:  Procedure Laterality Date  . CARDIAC CATHETERIZATION  11-17-2015   [facility]- dr Marcello Moores povsic   abnormal stress test w/ ischemia (received from nephrologist dr webb office) non-obstrucitve CAD- diffuse mid to distal LAD 60% and insignificant RCA,  stress test does not really match up  with this lesion and this lesion is likely to remain insignificant  . CARDIOVASCULAR STRESS TEST  10/15/2015   Intermediate risk nuclear study w/ moderate size and intensity, basal to mid inferior wall reversible perfusion defect suggestive of ischemia/  ef 57%,  inferior and inferoseptal hypokinesis to dyskinesis  . [address]  . CYSTOSCOPY W/ RETROGRADES Bilateral 07/11/2016   Procedure: CYSTOSCOPY WITH RETROGRADE PYELOGRAM;  Surgeon: Nickie Retort, MD;  Location: [facility];  Service: Urology;  Laterality: Bilateral;   . CYSTOSCOPY WITH BIOPSY Left 07/11/2016   Procedure: CYSTOSCOPY WITH  LEFT RENAL PELVIS BIOPSY;  Surgeon: Nickie Retort, MD;  Location: [facility];  Service: Urology;  Laterality: Left;  . CYSTOSCOPY WITH STENT PLACEMENT Left 07/11/2016   Procedure: CYSTOSCOPY WITH STENT PLACEMENT;  Surgeon: Nickie Retort, MD;  Location: [facility];  Service: Urology;  Laterality: Left;  . CYSTOSCOPY WITH URETEROSCOPY Left 07/11/2016   Procedure: CYSTOSCOPY WITH URETEROSCOPY;  Surgeon: Nickie Retort, MD;  Location: [facility];  Service: Urology;  Laterality: Left;  . INSERTION OF DIALYSIS CATHETER  06-23-2015  at [facility]   Tenckhoff coiled peritoneal catheter  . 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 TRANSTHORACIC ECHOCARDIOGRAM  10/15/2015   poor acoustic windows limit study-  ef 50% with akinesis of the basal posterior, inferior , and lateral walls,  grade 1 diastolic dysfunction/;  . WRIST ARTHROSCOPY W/ TRIANGULAR FIBROCARTILAGE REPAIR Right 03/10/2010   Family History  Problem Relation Age of Onset  . Hypertension Other   . Diabetes Other   . Renal Disease Mother   . Heart attack Father    Social History   Socioeconomic History  . Marital status: Married    Spouse name: Not on file  . Number of children: Not on file  . Years of education: Not on file  . Highest education level: Not on file  Occupational History  . Occupation: Child care  Tobacco Use  . Smoking status: Never Smoker  . Smokeless tobacco: Never Used  Substance and Sexual Activity  . Alcohol use: No  . Drug use: No  . Sexual activity: Not Currently  Other Topics Concern  . Not on file  Social History Narrative  . Not on file   Social Determinants of Health   Financial Resource Strain: Low Risk   . Difficulty of Paying Living Expenses: Not hard at all  Food Insecurity: No Food Insecurity  . Worried About Charity fundraiser  in the Last Year: Never true  . Ran Out of Food in the Last Year: Never true  Transportation Needs: No Transportation Needs  . Lack of Transportation (Medical): No  . Lack of Transportation (Non-Medical): No  Physical Activity: Insufficiently Active  . Days of Exercise per Week: 2 days  . Minutes of Exercise per Session: 30 min  Stress: No Stress Concern Present  . Feeling of Stress : Not at all  Social Connections:   . Frequency of Communication with Friends and Family:   . Frequency of Social Gatherings with Friends and Family:   . Attends Religious Services:   . Active Member of Clubs or Organizations:   . Attends Archivist Meetings:   Marland Kitchen Marital Status:     Outpatient Encounter Medications as of 12/24/2019  Medication Sig  . acetaminophen (TYLENOL) 500 MG tablet Take 500 mg by mouth every 8 (eight)  hours as needed for mild pain, fever or headache.   . albuterol (VENTOLIN HFA) 108 (90 Base) MCG/ACT inhaler Inhale 2 puffs into the lungs every 6 (six) hours as needed for wheezing or shortness of breath (q 4h for wheezing and cough x 7 days). Please educate how to use  . amLODipine (NORVASC) 5 MG tablet Take 5 mg by mouth daily.  Marland Kitchen azithromycin (ZITHROMAX) 250 MG tablet Take 250 mg by mouth once.   . Cholecalciferol (VITAMIN D3) 125 MCG (5000 UT) CAPS Take by mouth.  . Cyanocobalamin (VITAMIN B12) 1000 MCG TBCR Take by mouth.  . mycophenolate (CELLCEPT) 250 MG capsule Take 250 mg by mouth 2 (two) times daily. 9am and 9pm  . predniSONE (DELTASONE) 5 MG tablet Take 5 mg by mouth daily with breakfast. Patient takes at [place] HCL PO Take by mouth. 1/2 tab  . sulfamethoxazole-trimethoprim (BACTRIM DS) 800-160 MG tablet TK 1 T PO Q 8 H  . SYNTHROID 150 MCG tablet Take 1 tablet (150 mcg total) by mouth daily before breakfast. Take  1 tablet by mouth Monday - Friday, none on Saturday and Sunday  . Tacrolimus ER (ENVARSUS XR) 1 MG TB24 Take by mouth. 3 tab daily   No  facility-administered encounter medications on file as of 12/24/2019.    Activities of Daily Living In your present state of health, do you have any difficulty performing the following activities: 12/24/2019 06/25/2019  Hearing? N N  Vision? N N  Difficulty concentrating or making decisions? N N  Walking or climbing stairs? N N  Dressing or bathing? - N  Doing errands, shopping? N -  Preparing Food and eating ? N N  Using the Toilet? N N  In the past six months, have you accidently leaked urine? N N  Do you have problems with loss of bowel control? N N  Managing your Medications? N N  Managing your Finances? N N  Housekeeping or managing your Housekeeping? N N  Some recent data might be hidden    Patient Care Team: Glendale Chard, MD as PCP - General (Internal Medicine) [place], [place], [place] (Optometry) Edrick Oh, MD as Consulting Physician (Nephrology)    Assessment:   This is a routine wellness examination for Zahra.  Exercise Activities and Dietary recommendations Current Exercise Habits: Home exercise routine, Type of exercise: walking, Time (Minutes): 30, Frequency (Times/Week): 2, Weekly Exercise (Minutes/Week): 60  Goals    . Planning to write a book (pt-stated)    . Weight (lb) < 200 lb (90.7 kg)    . Weight (lb) < 200 lb (90.7 kg)     12/24/2019, wants to weigh 140 pounds       Fall Risk Fall Risk  12/24/2019 12/24/2019 07/03/2019 04/03/2019 03/19/2019  Falls in the past year? 0 0 0 0 0  Number falls in past yr: - 0 0 - -  Injury with Fall? - 0 0 - -  Risk for fall due to : Medication side effect - - - -  Follow up Falls evaluation completed;Education provided;Falls prevention discussed - - - -   Is the patient's home free of loose throw rugs in walkways, pet beds, electrical cords, etc?   yes      Grab bars in the bathroom? no      Handrails on the stairs?   n/a      Adequate lighting?   yes  Timed Get Up and Go performed: n/a  Depression Screen PHQ 2/9  Scores 12/24/2019 12/24/2019 07/03/2019 04/03/2019  PHQ - 2 Score 0 0 0 0  PHQ- 9 Score 2 - - -     Cognitive Function     6CIT Screen 12/24/2019 06/25/2019 06/19/2018  What Year? 0 points 0 points 0 points  What month? 0 points 0 points 0 points  What time? 0 points 0 points 0 points  Count back from 20 0 points 0 points 0 points  Months in reverse 0 points 0 points 0 points  Repeat phrase 0 points 0 points 0 points  Total Score 0 0 0     There is no immunization history on file for this patient.  Qualifies for Shingles Vaccine? yes  Screening Tests Health Maintenance  Topic Date Due  . COVID-19 Vaccine (1) 01/09/2020 (Originally 08/26/1972)  . INFLUENZA VACCINE  04/04/2020  . PAP SMEAR-Modifier  08/20/2020  . COLONOSCOPY  05/05/2021  . MAMMOGRAM  07/27/2021  . TETANUS/TDAP  08/07/2023  . Hepatitis C Screening  Completed  . HIV Screening  Completed    Cancer Screenings: Lung: Low Dose CT Chest recommended if Age 31-80 years, 30 pack-year currently smoking OR have quit w/in 15years. Patient does not qualify. Breast:  Up to date on Mammogram? Yes   Up to date of Bone Density/Dexa?  n/a Colorectal: up to date  Additional Screenings: : Hepatitis C Screening: 01/10/2018     Plan:    Patient wants to weigh 140 pounds.   I have personally reviewed and noted the following in the patient's chart:   . Medical and social history . Use of alcohol, tobacco or illicit drugs  . Current medications and supplements . Functional ability and status . Nutritional status . Physical activity . Advanced directives . List of other physicians . Hospitalizations, surgeries, and ER visits in previous 12 months . Vitals . Screenings to include cognitive, depression, and falls . Referrals and appointments  In addition, I have reviewed and discussed with patient certain preventive protocols, quality metrics, and best practice recommendations. A written personalized care plan for  preventive services as well as general preventive health recommendations were provided to patient.     Kellie Simmering, LPN  6/31/4970

## 2019-12-24 NOTE — Progress Notes (Signed)
This visit occurred during the SARS-CoV-2 public health emergency.  Safety protocols were in place, including screening questions prior to the visit, additional usage of staff PPE, and extensive cleaning of exam room while observing appropriate contact time as indicated for disinfecting solutions.  Subjective:     Patient ID: Yvette Jenkins , female    DOB: Apr 30, 1956 , 64 y.o.   MRN: [id number]   Chief Complaint  Patient presents with  . Hypertension  . Joint Pain    HPI  She is here today for BP check. She reports compliance with meds. She has not yet received COVID vaccine. She is concerned b/c of her compromised immune system. She wants to wait until she is done with prolonged round of abx.   She is most concerned about joint pains. She c/o generalized joint pains. She awakens with joint stiffness. Persists throughout the day. She thinks her sx are due to flu vaccine administration.   Hypertension This is a chronic problem. The current episode started more than 1 year ago. The problem has been gradually improving since onset. The problem is controlled. Pertinent negatives include no blurred vision, chest pain, palpitations or shortness of breath. The current treatment provides moderate improvement. Compliance problems include exercise.      Past Medical History:  Diagnosis Date  . Acquired renal cyst of right kidney   . Anemia associated with chronic renal failure    IV Iron infusion   . Arthritis   . Coronary artery disease    per pt cardiac cath at [facility] 10-26-2015  non-obstructive cad of one vessel  . End-stage renal disease on peritoneal dialysis [facility])    nephrologist-  dr Justin Mend [facility])  daily dialysis start 7-8pm to 7-8am-- currently on kidney transplant list at [facility]  . Gross hematuria   . History of Graves' disease   . Hypertension   . Hypothyroidism, postradioiodine therapy   . Kidney transplant candidate    on waiting list at [facility]  . Left  nephrolithiasis    non-obstructive per pt   . PONV (postoperative nausea and vomiting)   . Secondary hyperparathyroidism of renal origin [facility])      Family History  Problem Relation Age of Onset  . Hypertension Other   . Diabetes Other   . Renal Disease Mother   . Heart attack Father      Current Outpatient Medications:  .  acetaminophen (TYLENOL) 500 MG tablet, Take 500 mg by mouth every 8 (eight) hours as needed for mild pain, fever or headache. , Disp: , Rfl:  .  albuterol (VENTOLIN HFA) 108 (90 Base) MCG/ACT inhaler, Inhale 2 puffs into the lungs every 6 (six) hours as needed for wheezing or shortness of breath (q 4h for wheezing and cough x 7 days). Please educate how to use, Disp: 1 g, Rfl: 0 .  amLODipine (NORVASC) 5 MG tablet, Take 5 mg by mouth daily., Disp: , Rfl:  .  azithromycin (ZITHROMAX) 250 MG tablet, Take 250 mg by mouth once. , Disp: , Rfl:  .  Cholecalciferol (VITAMIN D3) 125 MCG (5000 UT) CAPS, Take by mouth., Disp: , Rfl:  .  Cyanocobalamin (VITAMIN B12) 1000 MCG TBCR, Take by mouth., Disp: , Rfl:  .  mycophenolate (CELLCEPT) 250 MG capsule, Take 250 mg by mouth 2 (two) times daily. 9am and 9pm, Disp: , Rfl:  .  predniSONE (DELTASONE) 5 MG tablet, Take 5 mg by mouth daily with breakfast. Patient takes at [place]: ,  Rfl:  .  PYRIDOXINE HCL PO, Take by mouth. 1/2 tab, Disp: , Rfl:  .  sulfamethoxazole-trimethoprim (BACTRIM DS) 800-160 MG tablet, TK 1 T PO Q 8 H, Disp: , Rfl:  .  SYNTHROID 150 MCG tablet, Take 1 tablet (150 mcg total) by mouth daily before breakfast. Take  1 tablet by mouth Monday - Friday, none on Saturday and Sunday, Disp: 90 tablet, Rfl: 1 .  Tacrolimus ER (ENVARSUS XR) 1 MG TB24, Take by mouth. 3 tab daily, Disp: , Rfl:    Allergies  Allergen Reactions  . Aspirin Hives  . Tetracyclines & Related Hives and Other (See Comments)    GI intolerance -"Messes with stomach"     Review of Systems  Constitutional: Negative.   Eyes: Negative for  blurred vision.  Respiratory: Negative for shortness of breath.   Cardiovascular: Negative.  Negative for chest pain and palpitations.  Gastrointestinal: Negative.   Musculoskeletal: Positive for arthralgias.       She c/o gen. Arthralgias. She reports her sx started after getting flu vaccine. She got this in Feb. Sx worse in b/l hands and LUE.  Neurological: Positive for numbness.       She c/o numbness in hands/feet. She is not sure what triggers her sx. She denies UE/LE weakness.   Psychiatric/Behavioral: Negative.      Today's Vitals   12/24/19 1412  BP: 122/80  Pulse: 93  Temp: 97.8 F (36.6 C)  SpO2: 96%  Weight: 177 lb 9.6 oz (80.6 kg)  Height: 5\' 1"  (1.549 m)  PainSc: 5   PainLoc: Generalized   Body mass index is 33.56 kg/m.   Objective:  Physical Exam Vitals and nursing note reviewed.  Constitutional:      Appearance: Normal appearance.  HENT:     Head: Normocephalic and atraumatic.  Cardiovascular:     Rate and Rhythm: Normal rate and regular rhythm.     Heart sounds: Normal heart sounds.  Pulmonary:     Effort: Pulmonary effort is normal.     Breath sounds: Normal breath sounds.  Musculoskeletal:     Comments: Right middle finger PIP/DIP joint swelling. No overlying erythema.   Skin:    General: Skin is warm.  Neurological:     General: No focal deficit present.     Mental Status: She is alert.  Psychiatric:        Mood and Affect: Mood normal.        Behavior: Behavior normal.         Assessment And Plan:     1. Hypertensive nephropathy  Chronic, well controlled. She will continue with current meds.   2. Stage 3 chronic kidney disease, unspecified whether stage 3a or 3b CKD  This is resultant renal function s/p renal transplant.   3. Arthralgia, unspecified joint  I will check an arthritis panel. She is willing to see rheumatologist if needed. She is encouraged to follow an anti-inflammatory diet.   - ANA, IFA (with reflex) - CYCLIC  CITRUL PEPTIDE ANTIBODY, IGG/IGA - Rheumatoid factor - Sedimentation rate - Uric acid   4. Paresthesia  I will check vitamin B12 level.   - Vitamin B12  5. Myalgia  She is encouraged to stay well hydrated. I will check labs as listed below.   - CK, total   Maximino Greenland, MD    THE PATIENT IS ENCOURAGED TO PRACTICE SOCIAL DISTANCING DUE TO THE COVID-19 PANDEMIC.

## 2019-12-26 LAB — CK: Total CK: 52 U/L (ref 32–182)

## 2019-12-26 LAB — FANA STAINING PATTERNS
Homogeneous Pattern: 1:320 {titer} — ABNORMAL HIGH
Speckled Pattern: 1:320 {titer} — ABNORMAL HIGH

## 2019-12-26 LAB — RHEUMATOID FACTOR: Rheumatoid fact SerPl-aCnc: <10 IU/mL (ref 0.0–13.9)

## 2019-12-26 LAB — URIC ACID: Uric Acid: 6.4 mg/dL (ref 3.0–7.2)

## 2019-12-26 LAB — CYCLIC CITRUL PEPTIDE ANTIBODY, IGG/IGA: Cyclic Citrullin Peptide Ab: 4 units (ref 0–19)

## 2019-12-26 LAB — VITAMIN B12: Vitamin B-12: 1690 pg/mL — ABNORMAL HIGH (ref 232–1245)

## 2019-12-26 LAB — ANTINUCLEAR ANTIBODIES, IFA: ANA Titer 1: POSITIVE — AB

## 2019-12-26 LAB — SEDIMENTATION RATE: Sed Rate: 17 mm/hr (ref 0–40)

## 2019-12-28 DIAGNOSIS — M2550 Pain in unspecified joint: Secondary | ICD-10-CM | POA: Insufficient documentation

## 2019-12-28 DIAGNOSIS — M791 Myalgia, unspecified site: Secondary | ICD-10-CM | POA: Insufficient documentation

## 2019-12-28 DIAGNOSIS — R202 Paresthesia of skin: Secondary | ICD-10-CM | POA: Insufficient documentation

## 2019-12-28 DIAGNOSIS — I739 Peripheral vascular disease, unspecified: Secondary | ICD-10-CM | POA: Insufficient documentation

## 2019-12-28 DIAGNOSIS — N183 Chronic kidney disease, stage 3 unspecified: Secondary | ICD-10-CM | POA: Insufficient documentation

## 2019-12-31 ENCOUNTER — Telehealth: Payer: Self-pay

## 2019-12-31 NOTE — Telephone Encounter (Signed)
The patient was given her recent lab results.  The pt wants to know about her vitamin d and magnesium levels.  The pt said that she needed more information about what would be done at a rheumatologist office about her elevated ana level.

## 2019-12-31 NOTE — Telephone Encounter (Signed)
She c/o joint pain. I did some bloodwork which shows she needs further investigation. What is her hesitation?  Those labs were not checked. She can come for lab visit if she likes.

## 2019-12-31 NOTE — Telephone Encounter (Signed)
The patient said that she agrees to see a rheumatologist and ok about coming back for the additional blood work.

## 2020-01-06 ENCOUNTER — Other Ambulatory Visit: Payer: Self-pay

## 2020-01-06 DIAGNOSIS — R768 Other specified abnormal immunological findings in serum: Secondary | ICD-10-CM

## 2020-01-07 ENCOUNTER — Telehealth: Payer: Self-pay

## 2020-01-07 NOTE — Telephone Encounter (Signed)
Patient called in to check status of referral advised patient referral was placed on 01/06/2020 to [facility]

## 2020-01-12 ENCOUNTER — Encounter: Payer: Self-pay | Admitting: Rheumatology

## 2020-01-12 DIAGNOSIS — R768 Other specified abnormal immunological findings in serum: Secondary | ICD-10-CM | POA: Diagnosis not present

## 2020-01-12 DIAGNOSIS — M791 Myalgia, unspecified site: Secondary | ICD-10-CM | POA: Diagnosis not present

## 2020-01-12 DIAGNOSIS — Z79899 Other long term (current) drug therapy: Secondary | ICD-10-CM | POA: Diagnosis not present

## 2020-01-12 DIAGNOSIS — D849 Immunodeficiency, unspecified: Secondary | ICD-10-CM | POA: Diagnosis not present

## 2020-01-12 DIAGNOSIS — A439 Nocardiosis, unspecified: Secondary | ICD-10-CM | POA: Diagnosis not present

## 2020-01-12 DIAGNOSIS — Z940 Kidney transplant status: Secondary | ICD-10-CM | POA: Diagnosis not present

## 2020-01-12 DIAGNOSIS — R202 Paresthesia of skin: Secondary | ICD-10-CM | POA: Diagnosis not present

## 2020-01-12 DIAGNOSIS — R5383 Other fatigue: Secondary | ICD-10-CM | POA: Diagnosis not present

## 2020-01-12 DIAGNOSIS — Z992 Dependence on renal dialysis: Secondary | ICD-10-CM | POA: Diagnosis not present

## 2020-01-12 DIAGNOSIS — N186 End stage renal disease: Secondary | ICD-10-CM | POA: Diagnosis not present

## 2020-01-12 DIAGNOSIS — J181 Lobar pneumonia, unspecified organism: Secondary | ICD-10-CM | POA: Diagnosis not present

## 2020-01-12 DIAGNOSIS — Z792 Long term (current) use of antibiotics: Secondary | ICD-10-CM | POA: Diagnosis not present

## 2020-01-12 DIAGNOSIS — A430 Pulmonary nocardiosis: Secondary | ICD-10-CM | POA: Diagnosis not present

## 2020-01-29 DIAGNOSIS — M791 Myalgia, unspecified site: Secondary | ICD-10-CM | POA: Diagnosis not present

## 2020-01-29 DIAGNOSIS — Z5181 Encounter for therapeutic drug level monitoring: Secondary | ICD-10-CM | POA: Diagnosis not present

## 2020-01-29 DIAGNOSIS — Z940 Kidney transplant status: Secondary | ICD-10-CM | POA: Diagnosis not present

## 2020-01-29 DIAGNOSIS — B349 Viral infection, unspecified: Secondary | ICD-10-CM | POA: Diagnosis not present

## 2020-01-29 DIAGNOSIS — B259 Cytomegaloviral disease, unspecified: Secondary | ICD-10-CM | POA: Diagnosis not present

## 2020-02-04 ENCOUNTER — Other Ambulatory Visit: Payer: Self-pay

## 2020-02-04 DIAGNOSIS — R768 Other specified abnormal immunological findings in serum: Secondary | ICD-10-CM

## 2020-02-13 IMAGING — DX CHEST - 2 VIEW
2 series · 2 of 2 positions shown · non-contrast
Comparison: 03/19/2019

CLINICAL DATA: Pneumonia, not improving

EXAM:
CHEST - 2 VIEW

[dg chest 2 view (1 of 2)]
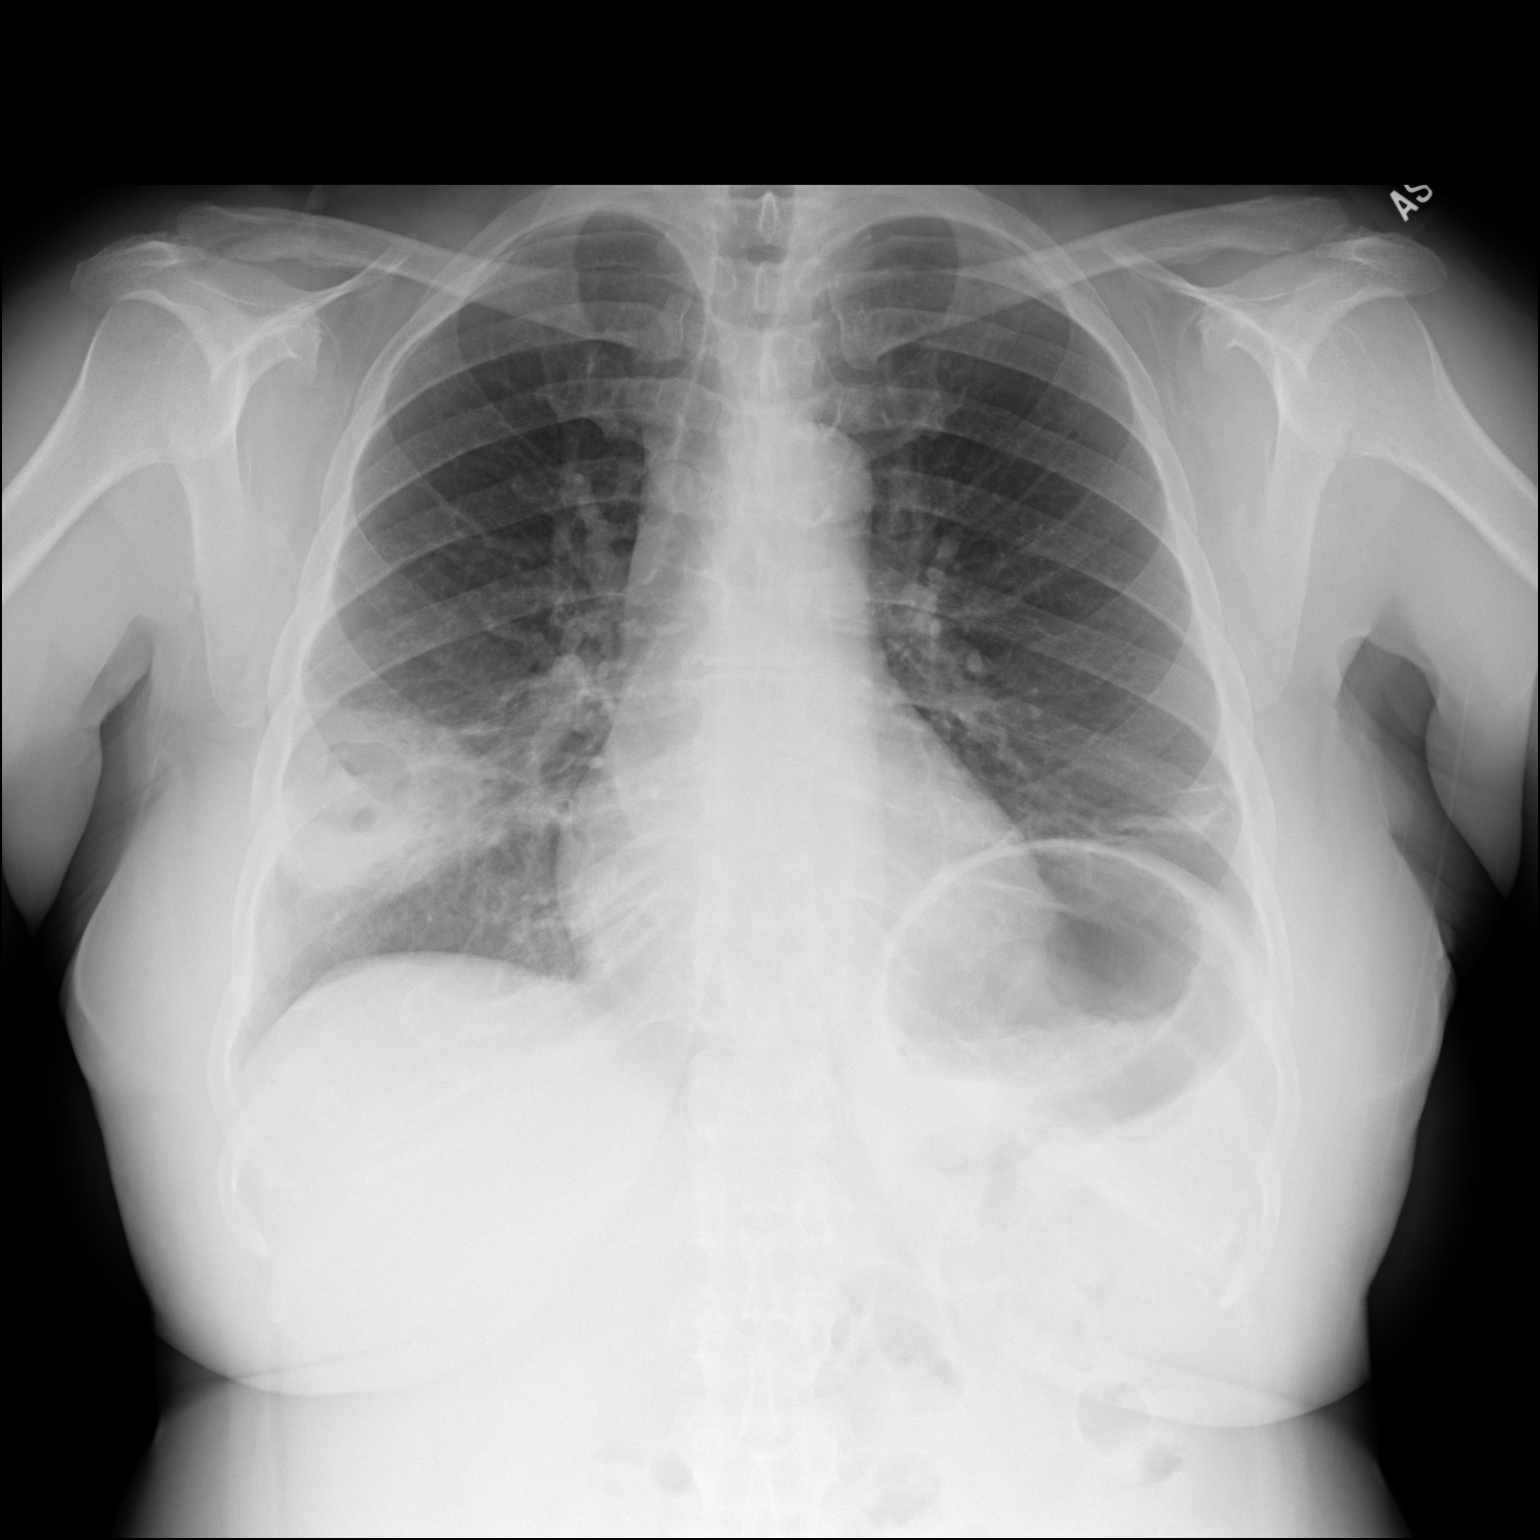

[dg chest 2 view (2 of 2)]
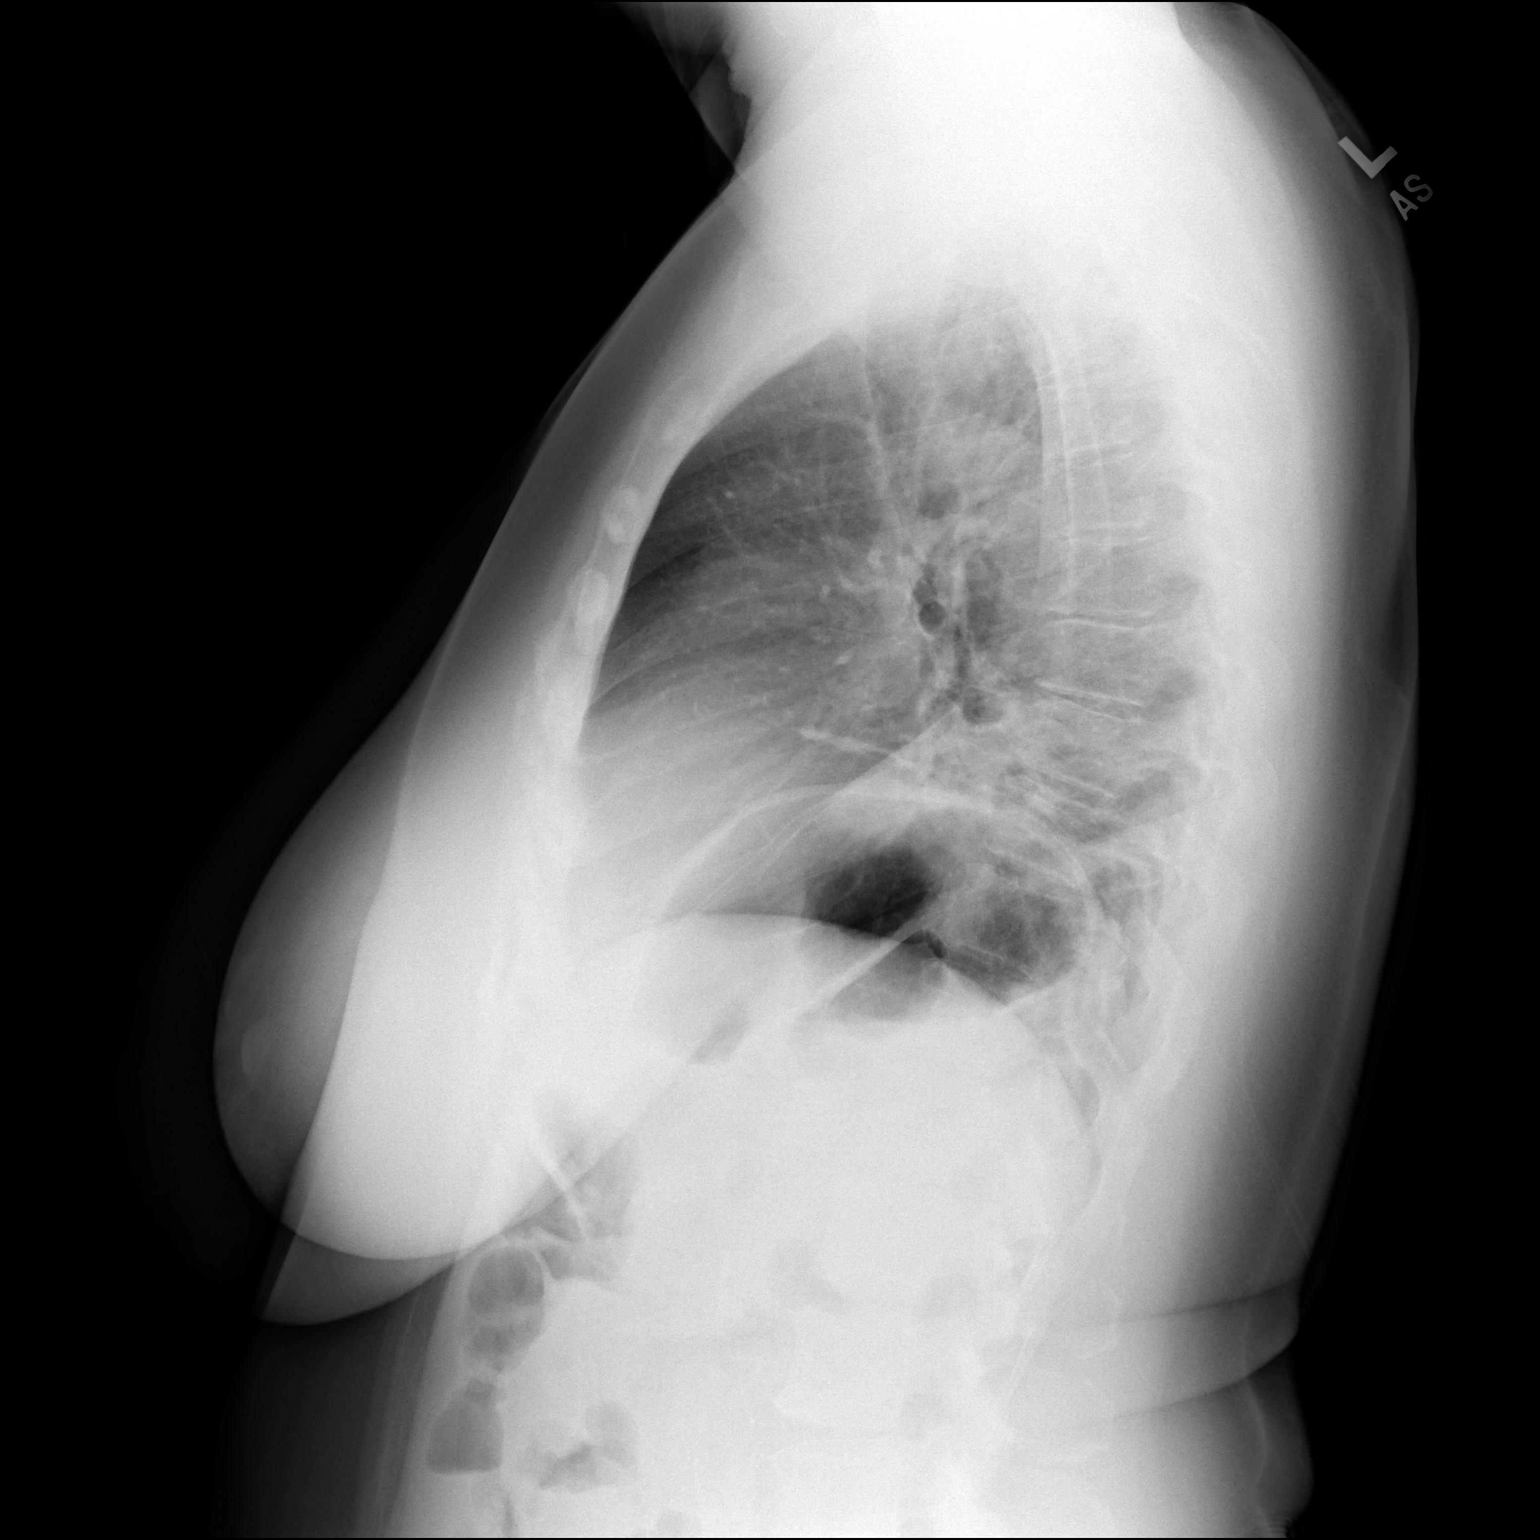

[2 of 2 positions shown; findings below may reference images not displayed]

FINDINGS: Right-sided airspace disease with interval cavitation. Recommend
chest CT, with contrast if possible. Mild atelectasis on the left.
Normal heart size.
IMPRESSION: Interval cavitation of the right-sided opacity, recommend chest CT
follow-up.

## 2020-02-24 DIAGNOSIS — Z1211 Encounter for screening for malignant neoplasm of colon: Secondary | ICD-10-CM | POA: Diagnosis not present

## 2020-02-25 DIAGNOSIS — M7552 Bursitis of left shoulder: Secondary | ICD-10-CM | POA: Diagnosis not present

## 2020-02-25 DIAGNOSIS — R768 Other specified abnormal immunological findings in serum: Secondary | ICD-10-CM | POA: Insufficient documentation

## 2020-02-25 DIAGNOSIS — Z940 Kidney transplant status: Secondary | ICD-10-CM | POA: Diagnosis not present

## 2020-03-01 ENCOUNTER — Telehealth: Payer: Self-pay

## 2020-03-01 NOTE — Telephone Encounter (Signed)
The pt said that she went to the rheumatologist and that she was told that she needed a referral to physical therapy.  The pt was told that the rheumatologist has placed the referral and the pt said that she was given a piece of paper and told that she can find a physical therapist so she wanted to know who dr sanders recommends.  The pt was given the information for [facility].

## 2020-03-04 ENCOUNTER — Other Ambulatory Visit: Payer: Self-pay | Admitting: Internal Medicine

## 2020-03-11 ENCOUNTER — Telehealth: Payer: Self-pay

## 2020-03-11 NOTE — Telephone Encounter (Signed)
Yvette Jenkins from [facility] called and said that the needed the referral sent again. I left the pt a message that they needed the referral slip.  I left another message that I spoke to [name] at [facility] and was told that the needed the referral slip because they needed the pt's insurance information.  The pt's insurance information was faxed to [facility].

## 2020-03-12 DIAGNOSIS — M79602 Pain in left arm: Secondary | ICD-10-CM | POA: Diagnosis not present

## 2020-03-12 DIAGNOSIS — M6281 Muscle weakness (generalized): Secondary | ICD-10-CM | POA: Diagnosis not present

## 2020-03-12 DIAGNOSIS — M25512 Pain in left shoulder: Secondary | ICD-10-CM | POA: Diagnosis not present

## 2020-03-17 DIAGNOSIS — M79602 Pain in left arm: Secondary | ICD-10-CM | POA: Diagnosis not present

## 2020-03-17 DIAGNOSIS — M6281 Muscle weakness (generalized): Secondary | ICD-10-CM | POA: Diagnosis not present

## 2020-03-17 DIAGNOSIS — M25512 Pain in left shoulder: Secondary | ICD-10-CM | POA: Diagnosis not present

## 2020-03-17 DIAGNOSIS — Z03818 Encounter for observation for suspected exposure to other biological agents ruled out: Secondary | ICD-10-CM | POA: Diagnosis not present

## 2020-03-19 DIAGNOSIS — Z6831 Body mass index (BMI) 31.0-31.9, adult: Secondary | ICD-10-CM | POA: Diagnosis not present

## 2020-03-19 DIAGNOSIS — E669 Obesity, unspecified: Secondary | ICD-10-CM | POA: Diagnosis not present

## 2020-03-19 DIAGNOSIS — Z79899 Other long term (current) drug therapy: Secondary | ICD-10-CM | POA: Diagnosis not present

## 2020-03-19 DIAGNOSIS — Z0121 Encounter for dental examination and cleaning with abnormal findings: Secondary | ICD-10-CM | POA: Diagnosis not present

## 2020-03-19 DIAGNOSIS — K029 Dental caries, unspecified: Secondary | ICD-10-CM | POA: Diagnosis not present

## 2020-03-19 DIAGNOSIS — G629 Polyneuropathy, unspecified: Secondary | ICD-10-CM | POA: Diagnosis not present

## 2020-03-19 DIAGNOSIS — K117 Disturbances of salivary secretion: Secondary | ICD-10-CM | POA: Diagnosis not present

## 2020-03-19 DIAGNOSIS — I10 Essential (primary) hypertension: Secondary | ICD-10-CM | POA: Diagnosis not present

## 2020-03-19 DIAGNOSIS — Z940 Kidney transplant status: Secondary | ICD-10-CM | POA: Diagnosis not present

## 2020-03-19 DIAGNOSIS — E039 Hypothyroidism, unspecified: Secondary | ICD-10-CM | POA: Diagnosis not present

## 2020-03-19 DIAGNOSIS — I129 Hypertensive chronic kidney disease with stage 1 through stage 4 chronic kidney disease, or unspecified chronic kidney disease: Secondary | ICD-10-CM | POA: Diagnosis not present

## 2020-03-19 DIAGNOSIS — E0590 Thyrotoxicosis, unspecified without thyrotoxic crisis or storm: Secondary | ICD-10-CM | POA: Diagnosis not present

## 2020-03-19 DIAGNOSIS — N2581 Secondary hyperparathyroidism of renal origin: Secondary | ICD-10-CM | POA: Diagnosis not present

## 2020-03-19 DIAGNOSIS — N189 Chronic kidney disease, unspecified: Secondary | ICD-10-CM | POA: Diagnosis not present

## 2020-03-19 DIAGNOSIS — I2510 Atherosclerotic heart disease of native coronary artery without angina pectoris: Secondary | ICD-10-CM | POA: Diagnosis not present

## 2020-03-19 DIAGNOSIS — D573 Sickle-cell trait: Secondary | ICD-10-CM | POA: Diagnosis not present

## 2020-03-24 DIAGNOSIS — M79602 Pain in left arm: Secondary | ICD-10-CM | POA: Diagnosis not present

## 2020-03-24 DIAGNOSIS — M25512 Pain in left shoulder: Secondary | ICD-10-CM | POA: Diagnosis not present

## 2020-03-24 DIAGNOSIS — M6281 Muscle weakness (generalized): Secondary | ICD-10-CM | POA: Diagnosis not present

## 2020-03-29 DIAGNOSIS — M7552 Bursitis of left shoulder: Secondary | ICD-10-CM | POA: Diagnosis not present

## 2020-03-29 DIAGNOSIS — M791 Myalgia, unspecified site: Secondary | ICD-10-CM | POA: Diagnosis not present

## 2020-03-29 DIAGNOSIS — J189 Pneumonia, unspecified organism: Secondary | ICD-10-CM | POA: Diagnosis not present

## 2020-03-29 DIAGNOSIS — Z79890 Hormone replacement therapy (postmenopausal): Secondary | ICD-10-CM | POA: Diagnosis not present

## 2020-03-29 DIAGNOSIS — Z23 Encounter for immunization: Secondary | ICD-10-CM | POA: Diagnosis not present

## 2020-03-29 DIAGNOSIS — N186 End stage renal disease: Secondary | ICD-10-CM | POA: Diagnosis not present

## 2020-03-29 DIAGNOSIS — Z940 Kidney transplant status: Secondary | ICD-10-CM | POA: Diagnosis not present

## 2020-03-29 DIAGNOSIS — I120 Hypertensive chronic kidney disease with stage 5 chronic kidney disease or end stage renal disease: Secondary | ICD-10-CM | POA: Diagnosis not present

## 2020-03-29 DIAGNOSIS — D849 Immunodeficiency, unspecified: Secondary | ICD-10-CM | POA: Diagnosis not present

## 2020-03-29 DIAGNOSIS — Z4822 Encounter for aftercare following kidney transplant: Secondary | ICD-10-CM | POA: Diagnosis not present

## 2020-03-29 DIAGNOSIS — E039 Hypothyroidism, unspecified: Secondary | ICD-10-CM | POA: Diagnosis not present

## 2020-03-29 DIAGNOSIS — A430 Pulmonary nocardiosis: Secondary | ICD-10-CM | POA: Diagnosis not present

## 2020-03-29 DIAGNOSIS — D84821 Immunodeficiency due to drugs: Secondary | ICD-10-CM | POA: Diagnosis not present

## 2020-03-29 DIAGNOSIS — Z79899 Other long term (current) drug therapy: Secondary | ICD-10-CM | POA: Diagnosis not present

## 2020-04-07 DIAGNOSIS — K08409 Partial loss of teeth, unspecified cause, unspecified class: Secondary | ICD-10-CM | POA: Diagnosis not present

## 2020-04-13 ENCOUNTER — Other Ambulatory Visit: Payer: Self-pay | Admitting: Internal Medicine

## 2020-04-13 DIAGNOSIS — Z1231 Encounter for screening mammogram for malignant neoplasm of breast: Secondary | ICD-10-CM

## 2020-04-15 ENCOUNTER — Encounter (HOSPITAL_COMMUNITY): Payer: Self-pay | Admitting: Obstetrics and Gynecology

## 2020-04-15 ENCOUNTER — Other Ambulatory Visit: Payer: Self-pay

## 2020-04-15 ENCOUNTER — Emergency Department (HOSPITAL_COMMUNITY): Payer: MEDICARE

## 2020-04-15 ENCOUNTER — Ambulatory Visit (HOSPITAL_COMMUNITY)
Admission: EM | Admit: 2020-04-15 | Discharge: 2020-04-15 | Disposition: A | Discharge status: Home / Self Care | Payer: MEDICARE | Attending: Family Medicine | Admitting: Family Medicine

## 2020-04-15 ENCOUNTER — Emergency Department (HOSPITAL_COMMUNITY)
Admission: EM | Admit: 2020-04-15 | Discharge: 2020-04-16 | Disposition: A | Discharge status: Home / Self Care | Payer: MEDICARE | Attending: Emergency Medicine | Admitting: Emergency Medicine

## 2020-04-15 DIAGNOSIS — R0789 Other chest pain: Secondary | ICD-10-CM | POA: Insufficient documentation

## 2020-04-15 DIAGNOSIS — Z5321 Procedure and treatment not carried out due to patient leaving prior to being seen by health care provider: Secondary | ICD-10-CM | POA: Diagnosis not present

## 2020-04-15 LAB — CBC
HCT: 40.4 % (ref 36.0–46.0)
Hemoglobin: 12.6 g/dL (ref 12.0–15.0)
MCH: 27.7 pg (ref 26.0–34.0)
MCHC: 31.2 g/dL (ref 30.0–36.0)
MCV: 88.8 fL (ref 80.0–100.0)
Platelets: 415 10*3/uL — ABNORMAL HIGH (ref 150–400)
RBC: 4.55 MIL/uL (ref 3.87–5.11)
RDW: 14.1 % (ref 11.5–15.5)
WBC: 11.9 10*3/uL — ABNORMAL HIGH (ref 4.0–10.5)
nRBC: 0 % (ref 0.0–0.2)

## 2020-04-15 LAB — TROPONIN I (HIGH SENSITIVITY): Troponin I (High Sensitivity): 4 ng/L (ref <18)

## 2020-04-15 LAB — BASIC METABOLIC PANEL
Anion gap: 9 (ref 5–15)
BUN: 25 mg/dL — ABNORMAL HIGH (ref 8–23)
CO2: 25 mmol/L (ref 22–32)
Calcium: 10.1 mg/dL (ref 8.9–10.3)
Chloride: 106 mmol/L (ref 98–111)
Creatinine, Ser: 2.03 mg/dL — ABNORMAL HIGH (ref 0.44–1.00)
GFR calc Af Amer: 29 mL/min — ABNORMAL LOW (ref >60)
GFR calc non Af Amer: 25 mL/min — ABNORMAL LOW (ref >60)
Glucose, Bld: 112 mg/dL — ABNORMAL HIGH (ref 70–99)
Potassium: 4.9 mmol/L (ref 3.5–5.1)
Sodium: 140 mmol/L (ref 135–145)

## 2020-04-15 NOTE — ED Triage Notes (Addendum)
Pt c/o CP under left breast for approx 3 days. Pt states pain is intermittent and sharp in nature. Has had some nausea and SOB. Pt states the first day of CP she also had general feelings of weakness. Pt states she took baking soda and feels that pain may have improved. Also reports that her stool has been harder than normal, passing increased gas.  Denies radiating pain to back, shoulder/arm/jaw, vomiting, diaphoresis, dizziness.  Still being tx for narcadia pneumonia since last year. Notified Dr. Meda Coffee of pt c/c and status. Advised that pt needs to go to ED 2/2 CP and extensive medical history. Pt stated understanding and advised pt that [facility] ED has shorter waiting list/time than [facility] ED. Patient is being discharged from the Urgent Care and sent to the Emergency Department via POV . Per Dr. Meda Coffee patient is in need of higher level of care due to CP Patient is aware and verbalizes understanding of plan of care.  Vitals:   04/15/20 1623  BP: (!) 162/100  Pulse: 97  Resp: 18  Temp: 98.4 F (36.9 C)  SpO2: 98%

## 2020-04-15 NOTE — ED Triage Notes (Signed)
Patient reports to the ER for pain in her chest x3 days. Patient reports she has been having sharp and constant left sided pain under the breast area.

## 2020-04-16 ENCOUNTER — Encounter (HOSPITAL_COMMUNITY): Payer: Self-pay | Admitting: Emergency Medicine

## 2020-04-16 ENCOUNTER — Other Ambulatory Visit: Payer: Self-pay

## 2020-04-16 ENCOUNTER — Emergency Department (HOSPITAL_COMMUNITY)
Admission: EM | Admit: 2020-04-16 | Discharge: 2020-04-16 | Disposition: A | Discharge status: Home / Self Care | Payer: MEDICARE | Attending: Emergency Medicine | Admitting: Emergency Medicine

## 2020-04-16 ENCOUNTER — Emergency Department (HOSPITAL_COMMUNITY): Payer: MEDICARE

## 2020-04-16 DIAGNOSIS — Z20822 Contact with and (suspected) exposure to covid-19: Secondary | ICD-10-CM | POA: Insufficient documentation

## 2020-04-16 DIAGNOSIS — R0789 Other chest pain: Secondary | ICD-10-CM | POA: Diagnosis not present

## 2020-04-16 DIAGNOSIS — N186 End stage renal disease: Secondary | ICD-10-CM | POA: Insufficient documentation

## 2020-04-16 DIAGNOSIS — E039 Hypothyroidism, unspecified: Secondary | ICD-10-CM | POA: Diagnosis not present

## 2020-04-16 DIAGNOSIS — I2510 Atherosclerotic heart disease of native coronary artery without angina pectoris: Secondary | ICD-10-CM | POA: Diagnosis not present

## 2020-04-16 DIAGNOSIS — Z79899 Other long term (current) drug therapy: Secondary | ICD-10-CM | POA: Insufficient documentation

## 2020-04-16 DIAGNOSIS — N183 Chronic kidney disease, stage 3 unspecified: Secondary | ICD-10-CM | POA: Diagnosis not present

## 2020-04-16 DIAGNOSIS — Z940 Kidney transplant status: Secondary | ICD-10-CM | POA: Diagnosis not present

## 2020-04-16 DIAGNOSIS — R079 Chest pain, unspecified: Secondary | ICD-10-CM | POA: Diagnosis present

## 2020-04-16 DIAGNOSIS — I129 Hypertensive chronic kidney disease with stage 1 through stage 4 chronic kidney disease, or unspecified chronic kidney disease: Secondary | ICD-10-CM | POA: Insufficient documentation

## 2020-04-16 DIAGNOSIS — N289 Disorder of kidney and ureter, unspecified: Secondary | ICD-10-CM | POA: Insufficient documentation

## 2020-04-16 LAB — URINALYSIS, ROUTINE W REFLEX MICROSCOPIC
Bilirubin Urine: NEGATIVE
Glucose, UA: NEGATIVE mg/dL
Ketones, ur: NEGATIVE mg/dL
Nitrite: NEGATIVE
Protein, ur: NEGATIVE mg/dL
Specific Gravity, Urine: 1.019 (ref 1.005–1.030)
pH: 5 (ref 5.0–8.0)

## 2020-04-16 LAB — SARS CORONAVIRUS 2 BY RT PCR (HOSPITAL ORDER, PERFORMED IN ~~LOC~~ HOSPITAL LAB): SARS Coronavirus 2: NEGATIVE

## 2020-04-16 NOTE — ED Notes (Signed)
Patient refused blood work, Insurance account manager and IV/blood work

## 2020-04-16 NOTE — Discharge Instructions (Addendum)
You were seen in the ED yesterday for your complaint.  X-ray of your chest demonstrate a focal consolidation on the right side of the lung which is likely related to the ongoing lung infection that he had within the past year.  Discussed this finding with your doctor.  Your renal function is demonstrating a creatinine of 2.03.  Share this with your kidney specialist on Monday.  If you develop worsening shortness of breath or worsening chest pain, please return to the ER for further evaluation.  A COVID-19 test have been performed today, you may check the results through MyChart in the next 24 h.

## 2020-04-16 NOTE — ED Notes (Signed)
Patient refuses new ekg and blood work.

## 2020-04-16 NOTE — ED Triage Notes (Signed)
Patient complaining of chest pain x 3 days. Patient states she is having sharp pain under left breast.

## 2020-04-16 NOTE — ED Provider Notes (Signed)
[place] DEPT Provider Note   CSN: [id number] Arrival date & time: 04/16/20  4650     History Chief Complaint  Patient presents with  . Chest Pain    Yvette Jenkins is a 64 y.o. female.  The history is provided by the patient and medical records. No language interpreter was used.  Chest Pain    64 year old female with significant history of CAD, end-stage renal disease, Graves' disease, hypertension, kidney transplant, anemia presenting complaint of chest pain.  Patient report for the past 4 days she has had intermittent pain primarily on the left side of her chest.  Pain is resolved under her left breast, described as a sharp sensation that happened intermittently lasting usually for several seconds that is unprovoked.  She endorsed some occasional cough and shortness of breath but that has been a persistent thing for more than a year which is not new.  She does not complain of any associated fever or chills.  No loss of taste or smell no nausea vomiting or diarrhea no exertional chest pain.  She does endorse occasional spasm in this location.  She has had kidney transplant in 2019 and does have an appointment with her kidney doctor in 2 days.  She denies any strenuous activities or heavy lifting.  She denies any rash.  Denies any history of shingles.  She has not had a Covid vaccination due to her underlying medical history.  She report being seen in the ED yesterday for her complaints.  After waiting for 8 h she left without being seen.  She is here today requesting to get a review of the chest x-ray and labs that was obtained yesterday.  Patient states her symptom has completely resolved throughout the day today.  She denies any prior history of PE or DVT.  She did report having an ongoing cavitary lesion on the right side of her lung for the past year that she has been extensively evaluated and have been on Bactrim antibiotic for year.      Past  Medical History:  Diagnosis Date  . Acquired renal cyst of right kidney   . Anemia associated with chronic renal failure    IV Iron infusion   . Arthritis   . Coronary artery disease    per pt cardiac cath at [facility] 10-26-2015  non-obstructive cad of one vessel  . End-stage renal disease on peritoneal dialysis [facility])    nephrologist-  dr Justin Mend [facility])  daily dialysis start 7-8pm to 7-8am-- currently on kidney transplant list at [facility]  . Gross hematuria   . History of Graves' disease   . Hypertension   . Hypothyroidism, postradioiodine therapy   . Kidney transplant candidate    on waiting list at [facility]  . Left nephrolithiasis    non-obstructive per pt   . PONV (postoperative nausea and vomiting)   . Secondary hyperparathyroidism of renal origin [facility])     Patient Active Problem List   Diagnosis Date Noted  . Stage 3 chronic kidney disease 12/28/2019  . Arthralgia 12/28/2019  . Paresthesia 12/28/2019  . Myalgia 12/28/2019  . Hypertensive nephropathy 12/24/2019  . History of renal transplant 03/20/2019  . Pneumonia of left lower lobe due to infectious organism 03/19/2019  . Diarrhea 03/19/2019  . CAP (community acquired pneumonia) 03/19/2019  . Hypothyroid 05/22/2018  . Vitamin D deficiency 05/22/2018    Past Surgical History:  Procedure Laterality Date  . CARDIAC CATHETERIZATION  11-17-2015   [facility]-  dr Marcello Moores povsic   abnormal stress test w/ ischemia (received from nephrologist dr webb office) non-obstrucitve CAD- diffuse mid to distal LAD 60% and insignificant RCA,  stress test does not really match up with this lesion and this lesion is likely to remain insignificant  . CARDIOVASCULAR STRESS TEST  10/15/2015   Intermediate risk nuclear study w/ moderate size and intensity, basal to mid inferior wall reversible perfusion defect suggestive of ischemia/  ef 57%,  inferior and inferoseptal hypokinesis to dyskinesis  . [place]  . CYSTOSCOPY W/  RETROGRADES Bilateral 07/11/2016   Procedure: CYSTOSCOPY WITH RETROGRADE PYELOGRAM;  Surgeon: Nickie Retort, MD;  Location: [facility];  Service: Urology;  Laterality: Bilateral;  . CYSTOSCOPY WITH BIOPSY Left 07/11/2016   Procedure: CYSTOSCOPY WITH  LEFT RENAL PELVIS BIOPSY;  Surgeon: Nickie Retort, MD;  Location: [facility];  Service: Urology;  Laterality: Left;  . CYSTOSCOPY WITH STENT PLACEMENT Left 07/11/2016   Procedure: CYSTOSCOPY WITH STENT PLACEMENT;  Surgeon: Nickie Retort, MD;  Location: [facility];  Service: Urology;  Laterality: Left;  . CYSTOSCOPY WITH URETEROSCOPY Left 07/11/2016   Procedure: CYSTOSCOPY WITH URETEROSCOPY;  Surgeon: Nickie Retort, MD;  Location: [facility];  Service: Urology;  Laterality: Left;  . INSERTION OF DIALYSIS CATHETER  06-23-2015  at [facility]   Tenckhoff coiled peritoneal catheter  . 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 TRANSTHORACIC ECHOCARDIOGRAM  10/15/2015   poor acoustic windows limit study-  ef 50% with akinesis of the basal posterior, inferior , and lateral walls,  grade 1 diastolic dysfunction/;  . WRIST ARTHROSCOPY W/ TRIANGULAR FIBROCARTILAGE REPAIR Right 03/10/2010     OB History   No obstetric history on file.     Family History  Problem Relation Age of Onset  . Hypertension Other   . Diabetes Other   . Renal Disease Mother   . Heart attack Father     Social History   Tobacco Use  . Smoking status: Never Smoker  . Smokeless tobacco: Never Used  Vaping Use  . Vaping Use: Never used  Substance Use Topics  . Alcohol use: No  . Drug use: No    Home Medications Prior to Admission medications   Medication Sig Start Date End Date Taking? Authorizing Provider  acetaminophen (TYLENOL) 500 MG tablet Take 500 mg by mouth every 8 (eight) hours as needed for mild pain, fever or headache.     [provider]    albuterol (VENTOLIN HFA) 108 (90 Base) MCG/ACT inhaler Inhale 2 puffs into the lungs every 6 (six) hours as needed for wheezing or shortness of breath (q 4h for wheezing and cough x 7 days). Please educate how to use 05/06/19   Rodriguez-Southworth, Sunday Spillers, PA-C  amLODipine (NORVASC) 5 MG tablet Take 5 mg by mouth daily.    [provider]  azithromycin (ZITHROMAX) 250 MG tablet Take 250 mg by mouth once.  06/17/19   [provider]  Cholecalciferol (VITAMIN D3) 125 MCG (5000 UT) CAPS Take by mouth.    [provider]  Cyanocobalamin (VITAMIN B12) 1000 MCG TBCR Take by mouth.    [provider]  mycophenolate (CELLCEPT) 250 MG capsule Take 250 mg by mouth 2 (two) times daily. 9am and 9pm    [provider]  predniSONE (DELTASONE) 5 MG tablet Take 5 mg by mouth daily with breakfast. Patient  takes at 9am    [provider]  PYRIDOXINE HCL PO Take by mouth. 1/2 tab    [provider]  sulfamethoxazole-trimethoprim (BACTRIM DS) 800-160 MG tablet TK 1 T PO Q 8 H 06/17/19   [provider]  SYNTHROID 150 MCG tablet Take 1 tablet (150 mcg total) by mouth daily before breakfast. Take  1 tablet by mouth Monday - Friday, none on Saturday and Sunday 10/22/19   Glendale Chard, MD  Tacrolimus ER (ENVARSUS XR) 1 MG TB24 Take by mouth. 3 tab daily    [provider]    Allergies    Amoxicillin-pot clavulanate, Other, Aspirin, Tetracyclines & related, and Statins  Review of Systems   Review of Systems  Cardiovascular: Positive for chest pain.  All other systems reviewed and are negative.   Physical Exam Updated Vital Signs BP (!) 140/94 (BP Location: Right Arm)   Pulse 71   Temp 98.1 F (36.7 C) (Oral)   Resp 20   Ht 5\' 3"  (1.6 m)   Wt 80.7 kg   SpO2 100%   BMI 31.53 kg/m   Physical Exam Vitals and nursing note reviewed.  Constitutional:      General: She is not in acute distress.    Appearance: She is  well-developed. She is obese.  HENT:     Head: Atraumatic.  Eyes:     Conjunctiva/sclera: Conjunctivae normal.  Cardiovascular:     Rate and Rhythm: Normal rate and regular rhythm.     Pulses: Normal pulses.     Heart sounds: Normal heart sounds.  Pulmonary:     Effort: Pulmonary effort is normal.     Breath sounds: Normal breath sounds.  Chest:     Chest wall: No tenderness.  Abdominal:     Palpations: Abdomen is soft.     Tenderness: There is no abdominal tenderness.  Musculoskeletal:        General: No swelling.     Cervical back: Neck supple.  Skin:    Findings: No rash.  Neurological:     Mental Status: She is alert and oriented to person, place, and time.  Psychiatric:        Mood and Affect: Mood normal.     ED Results / Procedures / Treatments   Labs (all labs ordered are listed, but only abnormal results are displayed) Labs Reviewed  SARS CORONAVIRUS 2 BY RT PCR ([place])    EKG None  Radiology DG Chest 2 View  Result Date: 04/15/2020 CLINICAL DATA:  Chest pain EXAM: CHEST - 2 VIEW COMPARISON:  04/04/2019 FINDINGS: There is stable elevation of the left hemidiaphragm with associated left basilar discoid atelectasis. Within the right lower lobe, in the area of previous pulmonary consolidation, is an area of focal scarring. There is, however, a focal opacity in this region possibly representing a focal infiltrate in the setting of acute lobar pneumonia. Alternatively, this may represent residual scarring or ongoing inflammatory change related to the prior inflammatory process. The lungs are otherwise clear. No pneumothorax or pleural effusion. Cardiac size within normal limits. Metallic density overlying the cardiac silhouette may be overlying the patient. Cardiac size within normal limits. Pulmonary vascularity normal. No acute bone abnormality. IMPRESSION: Focal opacity in the right lower lobe, in the area of previous  pulmonary consolidation, possibly representing a focal infiltrate in the setting of acute lobar pneumonia. Alternatively, this may represent residual scarring or ongoing inflammatory change related to the prior  inflammatory process. A follow-up chest radiograph in 4-6 weeks, following conservative therapy, would be helpful in documenting resolution. If persistent, this could be further assessed with dedicated CT imaging. Electronically Signed   By: Fidela Salisbury MD   On: 04/15/2020 19:08    Procedures Procedures (including critical care time)  Medications Ordered in ED Medications - No data to display  ED Course  I have reviewed the triage vital signs and the nursing notes.  Pertinent labs & imaging results that were available during my care of the patient were reviewed by me and considered in my medical decision making (see chart for details).    MDM Rules/Calculators/A&P                          BP (!) 165/94   Pulse 73   Temp 98.1 F (36.7 C) (Oral)   Resp 15   Ht 5\' 3"  (1.6 m)   Wt 80.7 kg   SpO2 100%   BMI 31.53 kg/m   Final Clinical Impression(s) / ED Diagnoses Final diagnoses:  Atypical chest pain  Renal insufficiency    Rx / DC Orders ED Discharge Orders    None     10:46 AM Patient report intermittent sharp pain underneath her left breast ongoing for the past several days.  Pain is completely resolved and she is currently pain-free.  She was seen in the ED yesterday for this complaint.  EKG, troponin, CBC, BMP, and chest x-ray was obtained.I was able to review the results.  Her labs remarkable for worsening renal function with a creatinine of 2.03.  It was 1.7 a month ago according to the patient.  Her GFR is 25.  The remainder of the labs are mostly at baseline.  Mild elevated white count of 11.9.  It has been high in the past.  Her chest x-ray shows a focal opacity in the right lower lobe possibly representing a focal infiltrates in the setting of acute lobar  pneumonia or alternatively could represents a residual scarring on ongoing laboratory changes related to prior inflammatory process.  I did discuss this finding with patient.  Patient reported she is aware of this indolent right lower lobe infiltrate that she has been extensively worked up and currently taking Bactrim for that.  Her pain is on the left side of her chest.  Since patient has not had Covid vaccination, will obtain COVID-19 test.  I offered to repeat her labs and x-ray today but patient refused stating she is here primarily just to follow-up on her labs result.  Her symptom is atypical of ACS.  I also have low suspicion for PE.  Due to worsening renal function, she is not a candidate for chest CTA.  However I did discuss options of additional testing such as V/Q study.  Prefers no additional testing at this time and she is scheduled to follow-up with nephrologist in 2 days.  Patient understands she may return if her symptoms worsen.  Patient was found to be hypertensive in the ED.  She will have her blood pressure rechecked by her doctor.  Yvette Jenkins was evaluated in Emergency Department on 04/16/2020 for the symptoms described in the history of present illness. She was evaluated in the context of the global COVID-19 pandemic, which necessitated consideration that the patient might be at risk for infection with the SARS-CoV-2 virus that causes COVID-19. Institutional protocols and algorithms that pertain to the evaluation of patients at  risk for COVID-19 are in a state of rapid change based on information released by regulatory bodies including the CDC and federal and state organizations. These policies and algorithms were followed during the patient's care in the ED.    Domenic Moras, PA-C 04/16/20 1054    Charlesetta Shanks, MD 05/05/20 1226

## 2020-04-28 DIAGNOSIS — Z940 Kidney transplant status: Secondary | ICD-10-CM | POA: Diagnosis not present

## 2020-04-30 ENCOUNTER — Ambulatory Visit: Payer: MEDICARE | Admitting: Rheumatology

## 2020-05-03 ENCOUNTER — Other Ambulatory Visit: Payer: Self-pay | Admitting: Internal Medicine

## 2020-05-03 DIAGNOSIS — Z124 Encounter for screening for malignant neoplasm of cervix: Secondary | ICD-10-CM | POA: Diagnosis not present

## 2020-05-03 DIAGNOSIS — Z6831 Body mass index (BMI) 31.0-31.9, adult: Secondary | ICD-10-CM | POA: Diagnosis not present

## 2020-05-03 LAB — HM PAP SMEAR: HM Pap smear: POSITIVE

## 2020-05-26 ENCOUNTER — Ambulatory Visit: Payer: MEDICARE | Admitting: Rheumatology

## 2020-06-24 ENCOUNTER — Encounter: Payer: Self-pay | Admitting: Internal Medicine

## 2020-06-24 ENCOUNTER — Other Ambulatory Visit: Payer: Self-pay

## 2020-06-24 ENCOUNTER — Ambulatory Visit (INDEPENDENT_AMBULATORY_CARE_PROVIDER_SITE_OTHER): Payer: MEDICARE | Admitting: Internal Medicine

## 2020-06-24 VITALS — BP 130/88 | HR 94 | Temp 98.2°F | Ht 63.0 in | Wt 173.8 lb

## 2020-06-24 DIAGNOSIS — N183 Chronic kidney disease, stage 3 unspecified: Secondary | ICD-10-CM

## 2020-06-24 DIAGNOSIS — I129 Hypertensive chronic kidney disease with stage 1 through stage 4 chronic kidney disease, or unspecified chronic kidney disease: Secondary | ICD-10-CM

## 2020-06-24 DIAGNOSIS — I700 Atherosclerosis of aorta: Secondary | ICD-10-CM

## 2020-06-24 DIAGNOSIS — E039 Hypothyroidism, unspecified: Secondary | ICD-10-CM | POA: Diagnosis not present

## 2020-06-24 DIAGNOSIS — Z940 Kidney transplant status: Secondary | ICD-10-CM | POA: Diagnosis not present

## 2020-06-24 DIAGNOSIS — Z79899 Other long term (current) drug therapy: Secondary | ICD-10-CM

## 2020-06-24 NOTE — Progress Notes (Signed)
I,Katawbba Wiggins,acting as a Education administrator for Maximino Greenland, MD.,have documented all relevant documentation on the behalf of Maximino Greenland, MD,as directed by  Maximino Greenland, MD while in the presence of Maximino Greenland, MD.  This visit occurred during the SARS-CoV-2 public health emergency.  Safety protocols were in place, including screening questions prior to the visit, additional usage of staff PPE, and extensive cleaning of exam room while observing appropriate contact time as indicated for disinfecting solutions.  Subjective:     Patient ID: Yvette Jenkins , female    DOB: Jun 01, 1956 , 64 y.o.   MRN: [id number]   Chief Complaint  Patient presents with  . Hypertension  . Hypothyroidism    HPI  The patient is here today for a blood pressure and thyroid follow-up.  She reports compliance with meds. Denies headaches, chest pain and shortness of breath.   Hypertension This is a chronic problem. The current episode started more than 1 year ago. The problem has been gradually improving since onset. The problem is controlled. Pertinent negatives include no blurred vision, chest pain, palpitations or shortness of breath. The current treatment provides moderate improvement. Compliance problems include exercise.  Identifiable causes of hypertension include a thyroid problem.  Thyroid Problem Presents for follow-up visit. Patient reports no depressed mood, diaphoresis, diarrhea or palpitations. The symptoms have been stable.     Past Medical History:  Diagnosis Date  . Acquired renal cyst of right kidney   . Anemia associated with chronic renal failure    IV Iron infusion   . Arthritis   . Coronary artery disease    per pt cardiac cath at [facility] 10-26-2015  non-obstructive cad of one vessel  . End-stage renal disease on peritoneal dialysis [facility])    nephrologist-  dr Justin Mend [facility])  daily dialysis start 7-8pm to 7-8am-- currently on kidney transplant list at [place]  .  Gross hematuria   . History of Graves' disease   . Hypertension   . Hypothyroidism, postradioiodine therapy   . Kidney transplant candidate    on waiting list at [facility]  . Left nephrolithiasis    non-obstructive per pt   . PONV (postoperative nausea and vomiting)   . Secondary hyperparathyroidism of renal origin [facility])      Family History  Problem Relation Age of Onset  . Hypertension Other   . Diabetes Other   . Renal Disease Mother   . Heart attack Father      Current Outpatient Medications:  Marland Kitchen  Melatonin 5 MG CAPS, Take by mouth., Disp: , Rfl:  .  mycophenolate (CELLCEPT) 250 MG capsule, Take by mouth., Disp: , Rfl:  .  acetaminophen (TYLENOL) 500 MG tablet, Take 500 mg by mouth every 8 (eight) hours as needed for mild pain, fever or headache. , Disp: , Rfl:  .  albuterol (VENTOLIN HFA) 108 (90 Base) MCG/ACT inhaler, Inhale 2 puffs into the lungs every 6 (six) hours as needed for wheezing or shortness of breath (q 4h for wheezing and cough x 7 days). Please educate how to use, Disp: 1 g, Rfl: 0 .  Albuterol Sulfate, sensor, (PROAIR DIGIHALER) 108 (90 Base) MCG/ACT AEPB, Inhale into the lungs., Disp: , Rfl:  .  amLODipine (NORVASC) 2.5 MG tablet, Take 2.5 mg by mouth 2 (two) times daily., Disp: , Rfl:  .  amLODipine (NORVASC) 5 MG tablet, Take 5 mg by mouth daily., Disp: , Rfl:  .  Biotin 1 MG CAPS, Take by  mouth., Disp: , Rfl:  .  Cholecalciferol (VITAMIN D3) 125 MCG (5000 UT) CAPS, Take by mouth., Disp: , Rfl:  .  Cyanocobalamin (VITAMIN B12) 1000 MCG TBCR, Take by mouth., Disp: , Rfl:  .  mycophenolate (CELLCEPT) 250 MG capsule, Take 250 mg by mouth 2 (two) times daily. 9am and 9pm, Disp: , Rfl:  .  predniSONE (DELTASONE) 5 MG tablet, Take 5 mg by mouth daily with breakfast. Patient takes at 9am, Disp: , Rfl:  .  Prenatal Multivit-Min-Fe-FA (PRENATAL, W/IRON & FA,) 27-0.8 MG TABS, Take 1 tablet by mouth daily., Disp: , Rfl:  .  Propylene Glycol 0.6 % SOLN, Apply to eye., Disp: ,  Rfl:  .  PYRIDOXINE HCL PO, Take by mouth. 1/2 tab, Disp: , Rfl:  .  sulfamethoxazole-trimethoprim (BACTRIM DS) 800-160 MG tablet, TK 1 T PO Q 8 H, Disp: , Rfl:  .  SYNTHROID 150 MCG tablet, TAKE 1 TABLET BY MOUTH DAILY BEFORE BREAKFAST MONDAY THRU FRIDAY. NONE ON SATURDAY AND SUNDAY, Disp: 90 tablet, Rfl: 1 .  Tacrolimus ER (ENVARSUS XR) 1 MG TB24, Take by mouth. 3 tab daily, Disp: , Rfl:    Allergies  Allergen Reactions  . Amoxicillin-Pot Clavulanate Nausea Only    Other reaction(s): Vomiting  . Other     Other reaction(s): Other (See Comments) S/P Renal Transplant Other reaction(s): Unknown Other reaction(s): Unknown  . Aspirin Hives    Other reaction(s): Unknown  . Tetracyclines & Related Hives, Other (See Comments) and Nausea Only    GI intolerance -"Messes with stomach" Other reaction(s): Unknown Messes with stomach   . Statins     Other reaction(s): Unknown     Review of Systems  Constitutional: Negative.  Negative for diaphoresis.  Eyes: Negative for blurred vision.  Respiratory: Negative.  Negative for shortness of breath.   Cardiovascular: Negative.  Negative for chest pain and palpitations.  Gastrointestinal: Negative.  Negative for diarrhea.  Psychiatric/Behavioral: Negative.   All other systems reviewed and are negative.    Today's Vitals   06/24/20 1426  BP: 130/88  Pulse: 94  Temp: 98.2 F (36.8 C)  TempSrc: Oral  Weight: 173 lb 12.8 oz (78.8 kg)  Height: 5\' 3"  (1.6 m)   Body mass index is 30.79 kg/m.  Wt Readings from Last 3 Encounters:  06/24/20 173 lb 12.8 oz (78.8 kg)  04/16/20 178 lb (80.7 kg)  12/24/19 177 lb (80.3 kg)   Objective:  Physical Exam Vitals and nursing note reviewed.  Constitutional:      Appearance: Normal appearance. She is obese.  HENT:     Head: Normocephalic and atraumatic.  Cardiovascular:     Rate and Rhythm: Normal rate and regular rhythm.     Heart sounds: Normal heart sounds.  Pulmonary:     Breath sounds:  Normal breath sounds.  Skin:    General: Skin is warm.  Neurological:     General: No focal deficit present.     Mental Status: She is alert and oriented to person, place, and time.         Assessment And Plan:     1. Hypertensive nephropathy Comments: Chronic, fair control. She will continue with current meds. She is encouraged to avoid adding salt to her foods. Renal function reviewed in Care Everywhere.   2. Stage 3 chronic kidney disease, unspecified whether stage 3a or 3b CKD ([place]) Comments: Chronic, this has been stable.   3. Primary hypothyroidism Comments: I will check thyroid panel and adjust  meds as needed. - TSH - T4, free  4. Atherosclerosis of aorta (HCC) Comments: Chronic. Would benefit from statin therapy. Will re-evaluate once I review lipid status.   5. History of renal transplant  6. Drug therapy - Vitamin B12     Patient was given opportunity to ask questions. Patient verbalized understanding of the plan and was able to repeat key elements of the plan. All questions were answered to their satisfaction.  Maximino Greenland, MD   I, Maximino Greenland, MD, have reviewed all documentation for this visit. The documentation on 06/26/20 for the exam, diagnosis, procedures, and orders are all accurate and complete.  THE PATIENT IS ENCOURAGED TO PRACTICE SOCIAL DISTANCING DUE TO THE COVID-19 PANDEMIC.

## 2020-06-24 NOTE — Patient Instructions (Signed)

## 2020-06-25 LAB — T4, FREE: Free T4: 1.24 ng/dL (ref 0.82–1.77)

## 2020-06-25 LAB — VITAMIN B12: Vitamin B-12: 1609 pg/mL — ABNORMAL HIGH (ref 232–1245)

## 2020-06-25 LAB — TSH: TSH: 0.82 u[IU]/mL (ref 0.450–4.500)

## 2020-07-09 ENCOUNTER — Telehealth: Payer: Self-pay

## 2020-07-09 NOTE — Telephone Encounter (Signed)
I returned the pt's call.  The pt called to get her lab results.  I see that she has now viewed them on MyChart/

## 2020-07-12 DIAGNOSIS — R202 Paresthesia of skin: Secondary | ICD-10-CM | POA: Diagnosis not present

## 2020-07-12 DIAGNOSIS — Z79899 Other long term (current) drug therapy: Secondary | ICD-10-CM | POA: Diagnosis not present

## 2020-07-12 DIAGNOSIS — Z23 Encounter for immunization: Secondary | ICD-10-CM | POA: Diagnosis not present

## 2020-07-12 DIAGNOSIS — Z8701 Personal history of pneumonia (recurrent): Secondary | ICD-10-CM | POA: Diagnosis not present

## 2020-07-12 DIAGNOSIS — I10 Essential (primary) hypertension: Secondary | ICD-10-CM | POA: Diagnosis not present

## 2020-07-12 DIAGNOSIS — D84821 Immunodeficiency due to drugs: Secondary | ICD-10-CM | POA: Diagnosis not present

## 2020-07-12 DIAGNOSIS — E785 Hyperlipidemia, unspecified: Secondary | ICD-10-CM | POA: Diagnosis not present

## 2020-07-12 DIAGNOSIS — Z792 Long term (current) use of antibiotics: Secondary | ICD-10-CM | POA: Diagnosis not present

## 2020-07-12 DIAGNOSIS — N2581 Secondary hyperparathyroidism of renal origin: Secondary | ICD-10-CM | POA: Diagnosis not present

## 2020-07-12 DIAGNOSIS — Z7952 Long term (current) use of systemic steroids: Secondary | ICD-10-CM | POA: Diagnosis not present

## 2020-07-12 DIAGNOSIS — M791 Myalgia, unspecified site: Secondary | ICD-10-CM | POA: Diagnosis not present

## 2020-07-12 DIAGNOSIS — R059 Cough, unspecified: Secondary | ICD-10-CM | POA: Diagnosis not present

## 2020-07-12 DIAGNOSIS — D849 Immunodeficiency, unspecified: Secondary | ICD-10-CM | POA: Diagnosis not present

## 2020-07-12 DIAGNOSIS — A430 Pulmonary nocardiosis: Secondary | ICD-10-CM | POA: Diagnosis not present

## 2020-07-12 DIAGNOSIS — D573 Sickle-cell trait: Secondary | ICD-10-CM | POA: Diagnosis not present

## 2020-07-12 DIAGNOSIS — Z4822 Encounter for aftercare following kidney transplant: Secondary | ICD-10-CM | POA: Diagnosis not present

## 2020-07-12 DIAGNOSIS — Z940 Kidney transplant status: Secondary | ICD-10-CM | POA: Diagnosis not present

## 2020-07-26 DIAGNOSIS — H40013 Open angle with borderline findings, low risk, bilateral: Secondary | ICD-10-CM | POA: Diagnosis not present

## 2020-07-28 ENCOUNTER — Ambulatory Visit: Payer: MEDICARE

## 2020-08-12 DIAGNOSIS — Z03818 Encounter for observation for suspected exposure to other biological agents ruled out: Secondary | ICD-10-CM | POA: Diagnosis not present

## 2020-08-12 DIAGNOSIS — U071 COVID-19: Secondary | ICD-10-CM | POA: Diagnosis not present

## 2020-08-12 DIAGNOSIS — R509 Fever, unspecified: Secondary | ICD-10-CM | POA: Diagnosis not present

## 2020-08-12 DIAGNOSIS — Z940 Kidney transplant status: Secondary | ICD-10-CM | POA: Diagnosis not present

## 2020-08-12 DIAGNOSIS — Z7189 Other specified counseling: Secondary | ICD-10-CM | POA: Diagnosis not present

## 2020-08-22 ENCOUNTER — Other Ambulatory Visit: Payer: Self-pay | Admitting: Internal Medicine

## 2020-10-04 DIAGNOSIS — I2510 Atherosclerotic heart disease of native coronary artery without angina pectoris: Secondary | ICD-10-CM | POA: Diagnosis not present

## 2020-10-04 DIAGNOSIS — Z8616 Personal history of COVID-19: Secondary | ICD-10-CM | POA: Diagnosis not present

## 2020-10-04 DIAGNOSIS — Z79899 Other long term (current) drug therapy: Secondary | ICD-10-CM | POA: Diagnosis not present

## 2020-10-04 DIAGNOSIS — D849 Immunodeficiency, unspecified: Secondary | ICD-10-CM | POA: Diagnosis not present

## 2020-10-04 DIAGNOSIS — I120 Hypertensive chronic kidney disease with stage 5 chronic kidney disease or end stage renal disease: Secondary | ICD-10-CM | POA: Diagnosis not present

## 2020-10-04 DIAGNOSIS — Z4822 Encounter for aftercare following kidney transplant: Secondary | ICD-10-CM | POA: Diagnosis not present

## 2020-10-04 DIAGNOSIS — Z79890 Hormone replacement therapy (postmenopausal): Secondary | ICD-10-CM | POA: Diagnosis not present

## 2020-10-04 DIAGNOSIS — A430 Pulmonary nocardiosis: Secondary | ICD-10-CM | POA: Diagnosis not present

## 2020-10-04 DIAGNOSIS — N184 Chronic kidney disease, stage 4 (severe): Secondary | ICD-10-CM | POA: Diagnosis not present

## 2020-10-04 DIAGNOSIS — N186 End stage renal disease: Secondary | ICD-10-CM | POA: Diagnosis not present

## 2020-10-04 DIAGNOSIS — Z940 Kidney transplant status: Secondary | ICD-10-CM | POA: Diagnosis not present

## 2020-10-04 DIAGNOSIS — D84821 Immunodeficiency due to drugs: Secondary | ICD-10-CM | POA: Diagnosis not present

## 2020-10-04 DIAGNOSIS — R300 Dysuria: Secondary | ICD-10-CM | POA: Diagnosis not present

## 2020-10-04 DIAGNOSIS — R918 Other nonspecific abnormal finding of lung field: Secondary | ICD-10-CM | POA: Diagnosis not present

## 2020-10-04 DIAGNOSIS — R079 Chest pain, unspecified: Secondary | ICD-10-CM | POA: Diagnosis not present

## 2020-10-04 DIAGNOSIS — I10 Essential (primary) hypertension: Secondary | ICD-10-CM | POA: Diagnosis not present

## 2020-10-04 DIAGNOSIS — E039 Hypothyroidism, unspecified: Secondary | ICD-10-CM | POA: Diagnosis not present

## 2020-10-04 DIAGNOSIS — Z7952 Long term (current) use of systemic steroids: Secondary | ICD-10-CM | POA: Diagnosis not present

## 2020-10-28 DIAGNOSIS — E782 Mixed hyperlipidemia: Secondary | ICD-10-CM | POA: Diagnosis not present

## 2020-10-28 DIAGNOSIS — R931 Abnormal findings on diagnostic imaging of heart and coronary circulation: Secondary | ICD-10-CM | POA: Diagnosis not present

## 2020-11-10 DIAGNOSIS — Z940 Kidney transplant status: Secondary | ICD-10-CM | POA: Diagnosis not present

## 2020-12-13 ENCOUNTER — Ambulatory Visit (INDEPENDENT_AMBULATORY_CARE_PROVIDER_SITE_OTHER): Payer: MEDICARE | Admitting: Nurse Practitioner

## 2020-12-13 ENCOUNTER — Encounter: Payer: Self-pay | Admitting: Nurse Practitioner

## 2020-12-13 ENCOUNTER — Other Ambulatory Visit: Payer: Self-pay

## 2020-12-13 VITALS — BP 142/82 | HR 82 | Temp 97.9°F | Ht 63.4 in | Wt 172.2 lb

## 2020-12-13 DIAGNOSIS — E039 Hypothyroidism, unspecified: Secondary | ICD-10-CM | POA: Diagnosis not present

## 2020-12-13 DIAGNOSIS — I700 Atherosclerosis of aorta: Secondary | ICD-10-CM | POA: Diagnosis not present

## 2020-12-13 DIAGNOSIS — R070 Pain in throat: Secondary | ICD-10-CM | POA: Diagnosis not present

## 2020-12-13 LAB — POCT RAPID STREP A (OFFICE): Rapid Strep A Screen: NEGATIVE

## 2020-12-13 NOTE — Progress Notes (Signed)
I,Yamilka [employer] as a Education administrator for [employer], FNP.,have documented all relevant documentation on the behalf of Minette Brine, FNP,as directed by  Minette Brine, FNP while in the presence of Minette Brine, Scott. This visit occurred during the SARS-CoV-2 public health emergency.  Safety protocols were in place, including screening questions prior to the visit, additional usage of staff PPE, and extensive cleaning of exam room while observing appropriate contact time as indicated for disinfecting solutions.  Subjective:     Patient ID: Yvette Jenkins , female    DOB: 02-Dec-1955 , 65 y.o.   MRN: [id number]   Chief Complaint  Patient presents with  . Hypothyroidism    HPI  Patient presents today for a f/u on her thyroid. She is having some adjustments with her medications. She states she is feeling funny in her throat, not like a sore throat and not like a pain. She has taken throat lozenges. Did not prevent her from eating. Felt raw to her throat.  She has been on bactrim for 2 years for the pneumonia - she was decreased to one tablet. She stopped taking bactrim completely on April 8th - Dr Luana Shu (Infectious Disease). Her symptoms were occurring before she stopped taking the medication.     Past Medical History:  Diagnosis Date  . Acquired renal cyst of right kidney   . Anemia associated with chronic renal failure    IV Iron infusion   . Arthritis   . Coronary artery disease    per pt cardiac cath at [facility] 10-26-2015  non-obstructive cad of one vessel  . End-stage renal disease on peritoneal dialysis [facility])    nephrologist-  dr Justin Mend [facility])  daily dialysis start 7-8pm to 7-8am-- currently on kidney transplant list at [facility]  . Gross hematuria   . History of Graves' disease   . Hypertension   . Hypothyroidism, postradioiodine therapy   . Kidney transplant candidate    on waiting list at [facility]  . Left nephrolithiasis    non-obstructive per pt   . PONV  (postoperative nausea and vomiting)   . Secondary hyperparathyroidism of renal origin [facility])      Family History  Problem Relation Age of Onset  . Hypertension Other   . Diabetes Other   . Renal Disease Mother   . Heart attack Father      Current Outpatient Medications:  .  acetaminophen (TYLENOL) 500 MG tablet, Take 500 mg by mouth every 8 (eight) hours as needed for mild pain, fever or headache. , Disp: , Rfl:  .  albuterol (VENTOLIN HFA) 108 (90 Base) MCG/ACT inhaler, Inhale 2 puffs into the lungs every 6 (six) hours as needed for wheezing or shortness of breath (q 4h for wheezing and cough x 7 days). Please educate how to use, Disp: 1 g, Rfl: 0 .  amLODipine (NORVASC) 2.5 MG tablet, Take 2.5 mg by mouth 2 (two) times daily., Disp: , Rfl:  .  Cholecalciferol (VITAMIN D3) 125 MCG (5000 UT) CAPS, Take by mouth., Disp: , Rfl:  .  Cyanocobalamin (VITAMIN B12) 1000 MCG TBCR, Take by mouth., Disp: , Rfl:  .  mycophenolate (CELLCEPT) 250 MG capsule, Take 250 mg by mouth 2 (two) times daily. 9am and 9pm, Disp: , Rfl:  .  predniSONE (DELTASONE) 5 MG tablet, Take 5 mg by mouth daily with breakfast. Patient takes at 9am, Disp: , Rfl:  .  Prenatal Multivit-Min-Fe-FA (PRENATAL, W/IRON & FA,) 27-0.8 MG TABS, Take 1 tablet by  mouth daily., Disp: , Rfl:  .  Propylene Glycol 0.6 % SOLN, Apply to eye., Disp: , Rfl:  .  SYNTHROID 150 MCG tablet, TAKE 1 TABLET BY MOUTH DAILY BEFORE BREAKFAST MONDAY THRU FRIDAY. NONE ON SATURDAY AND SUNDAY, Disp: 90 tablet, Rfl: 1 .  Tacrolimus ER (ENVARSUS XR) 1 MG TB24, Take by mouth. 2mg  tab daily, Disp: , Rfl:    Allergies  Allergen Reactions  . Amoxicillin-Pot Clavulanate Nausea Only    Other reaction(s): Vomiting  . Other     Other reaction(s): Other (See Comments) S/P Renal Transplant Other reaction(s): Unknown Other reaction(s): Unknown  . Aspirin Hives    Other reaction(s): Unknown  . Tetracyclines & Related Hives, Other (See Comments) and Nausea Only     GI intolerance -"Messes with stomach" Other reaction(s): Unknown Messes with stomach   . Statins     Other reaction(s): Unknown     Review of Systems  Constitutional: Negative.   HENT: Positive for sore throat.   Respiratory: Negative.   Cardiovascular: Negative.  Negative for chest pain, palpitations and leg swelling.  Neurological: Positive for headaches (wake up about 5 am with a headache in the last couple of weeks - back of head and left side.). Negative for dizziness.  Psychiatric/Behavioral: Negative.      Today's Vitals   12/13/20 0852  BP: (!) 142/82  Pulse: 82  Temp: 97.9 F (36.6 C)  TempSrc: Oral  Weight: 172 lb 3.2 oz (78.1 kg)  Height: 5' 3.4" (1.61 m)  PainSc: 7    Body mass index is 30.12 kg/m.   Objective:  Physical Exam Vitals reviewed.  Constitutional:      Appearance: Normal appearance. She is obese.  HENT:     Head: Normocephalic and atraumatic.  Cardiovascular:     Rate and Rhythm: Normal rate and regular rhythm.     Pulses: Normal pulses.     Heart sounds: Normal heart sounds. No murmur heard.   Pulmonary:     Effort: Pulmonary effort is normal. No respiratory distress.     Breath sounds: Normal breath sounds.  Skin:    General: Skin is warm.  Neurological:     General: No focal deficit present.     Mental Status: She is alert and oriented to person, place, and time.     Cranial Nerves: No cranial nerve deficit.  Psychiatric:        Mood and Affect: Mood normal.        Behavior: Behavior normal.        Thought Content: Thought content normal.        Judgment: Judgment normal.         Assessment And Plan:     1. Primary hypothyroidism  Chronic, controlled  Continue with current medications, taking synthroid Mon - Fri.  - TSH - T4, Free - T3  2. Atherosclerosis of aorta (HCC)  Chronic, no current medications.   3. Throat discomfort  Negative rapid strep  No abnormal findings on physical exam  Will check thyroid  levels today - POCT rapid strep A     Patient was given opportunity to ask questions. Patient verbalized understanding of the plan and was able to repeat key elements of the plan. All questions were answered to their satisfaction.  Minette Brine, FNP   I, Minette Brine, FNP, have reviewed all documentation for this visit. The documentation on 12/13/20 for the exam, diagnosis, procedures, and orders are all accurate and complete.   IF  YOU HAVE BEEN REFERRED TO A SPECIALIST, IT MAY TAKE 1-2 WEEKS TO SCHEDULE/PROCESS THE REFERRAL. IF YOU HAVE NOT HEARD FROM US/SPECIALIST IN TWO WEEKS, PLEASE GIVE [place] A CALL AT [phone].   THE PATIENT IS ENCOURAGED TO PRACTICE SOCIAL DISTANCING DUE TO THE COVID-19 PANDEMIC.

## 2020-12-13 NOTE — Patient Instructions (Signed)
Voltaren gel to your ankle - this is over the counter.

## 2020-12-14 LAB — T3: T3, Total: 64 ng/dL — ABNORMAL LOW (ref 71–180)

## 2020-12-14 LAB — TSH: TSH: 0.231 u[IU]/mL — ABNORMAL LOW (ref 0.450–4.500)

## 2020-12-14 LAB — T4, FREE: Free T4: 1.75 ng/dL (ref 0.82–1.77)

## 2020-12-21 ENCOUNTER — Telehealth: Payer: Self-pay

## 2020-12-21 NOTE — Telephone Encounter (Signed)
Pt called for lab results would like them explained

## 2020-12-22 ENCOUNTER — Other Ambulatory Visit: Payer: Self-pay | Admitting: Nurse Practitioner

## 2020-12-22 ENCOUNTER — Telehealth: Payer: Self-pay

## 2020-12-22 ENCOUNTER — Telehealth: Payer: Self-pay | Admitting: Nurse Practitioner

## 2020-12-22 DIAGNOSIS — E039 Hypothyroidism, unspecified: Secondary | ICD-10-CM

## 2020-12-22 MED ORDER — SYNTHROID 125 MCG PO TABS: 125.0000 ug | ORAL_TABLET | Freq: Every day | ORAL | 2 refills | Status: DC

## 2020-12-22 NOTE — Telephone Encounter (Signed)
Called patient to discuss her synthroid change she would prefer taking synthroid daily vs skipping doses. I have decrease her dose to 125 mcg daily and placed a referral for Endocrinology, she does verbalize never seeing a specialist in 42 years. She will come and get a sample from the office.

## 2020-12-22 NOTE — Telephone Encounter (Signed)
Your thyroid is overactive but this is could be due to the amount of synthroid you are taking, take your synthroid Monday to Thursday and hold Friday, Saturday and Sunday. Return in 4 weeks for repeat labs.   The pt said that she would like to be on a set amount that she takes daily and whats going to be happening to her thyroid on the days that she isn't taking any thyroid med.

## 2020-12-30 ENCOUNTER — Ambulatory Visit: Payer: MEDICARE

## 2020-12-30 ENCOUNTER — Ambulatory Visit: Payer: MEDICARE | Admitting: Internal Medicine

## 2021-01-14 ENCOUNTER — Ambulatory Visit (INDEPENDENT_AMBULATORY_CARE_PROVIDER_SITE_OTHER): Payer: MEDICARE | Admitting: Internal Medicine

## 2021-01-14 ENCOUNTER — Encounter: Payer: Self-pay | Admitting: Internal Medicine

## 2021-01-14 ENCOUNTER — Other Ambulatory Visit: Payer: Self-pay

## 2021-01-14 VITALS — BP 130/76 | HR 105 | Ht 63.0 in | Wt 170.1 lb

## 2021-01-14 DIAGNOSIS — E890 Postprocedural hypothyroidism: Secondary | ICD-10-CM | POA: Diagnosis not present

## 2021-01-14 NOTE — Progress Notes (Signed)
Name: Yvette Jenkins  MRN/ DOB: [id number], 27-Dec-1955    Age/ Sex: 65 y.o., female    PCP: Glendale Chard, MD   Reason for Endocrinology Evaluation: Post ablative hypothyroidism     Date of Initial Endocrinology Evaluation: 01/14/2021     HPI: Ms. Wafaa Deemer is a 65 y.o. female with a past medical history of postablative hypothyroidism, and HTN, she is S/P renal transplant 2019.. The patient presented for initial endocrinology clinic visit on 01/14/2021 for consultative assistance with her Hypothyroidism  She was diagnosed with hyperthyroidism in 2001 secondary to Graves' disease. She is S/P RAI in 40/8144 with 8.6 MILLICURIES OF Y-185.   Her synthroid was recently reduced to 125 mcg for the past week. Was on 150 mcg daily prior to this .   She has noted weight loss that she attributes to self discontinuation of  Prednisone . [facility] aware per pt   Denies palpitations Denies loose stool or diarrhea  Denies tremors  Denies local neck symptoms  Has joint pains and leg muscle pains - she is seeing PT  She is on MVI  She is on Vitamin 5000 iu daily  She is on Biotin as well   Synthroid 125 mcg daily      HISTORY:  Past Medical History:  Past Medical History:  Diagnosis Date  . Acquired renal cyst of right kidney   . Anemia associated with chronic renal failure    IV Iron infusion   . Arthritis   . Coronary artery disease    per pt cardiac cath at [facility] 10-26-2015  non-obstructive cad of one vessel  . End-stage renal disease on peritoneal dialysis [facility])    nephrologist-  dr Justin [facility])  daily dialysis start 7-8pm to 7-8am-- currently on kidney transplant list at [facility]  . Gross hematuria   . History of Graves' disease   . Hypertension   . Hypothyroidism, postradioiodine therapy   . Kidney transplant candidate    on waiting list at [facility]  . Left nephrolithiasis    non-obstructive per pt   . PONV (postoperative nausea and vomiting)   .  Secondary hyperparathyroidism of renal origin [facility])    Past Surgical History:  Past Surgical History:  Procedure Laterality Date  . CARDIAC CATHETERIZATION  11-17-2015   [facility]- dr Marcello Moores povsic   abnormal stress test w/ ischemia (received from nephrologist dr webb office) non-obstrucitve CAD- diffuse mid to distal LAD 60% and insignificant RCA,  stress test does not really match up with this lesion and this lesion is likely to remain insignificant  . CARDIOVASCULAR STRESS TEST  10/15/2015   Intermediate risk nuclear study w/ moderate size and intensity, basal to mid inferior wall reversible perfusion defect suggestive of ischemia/  ef 57%,  inferior and inferoseptal hypokinesis to dyskinesis  . [place]  . CYSTOSCOPY W/ RETROGRADES Bilateral 07/11/2016   Procedure: CYSTOSCOPY WITH RETROGRADE PYELOGRAM;  Surgeon: Nickie Retort, MD;  Location: [facility];  Service: Urology;  Laterality: Bilateral;  . CYSTOSCOPY WITH BIOPSY Left 07/11/2016   Procedure: CYSTOSCOPY WITH  LEFT RENAL PELVIS BIOPSY;  Surgeon: Nickie Retort, MD;  Location: [facility];  Service: Urology;  Laterality: Left;  . CYSTOSCOPY WITH STENT PLACEMENT Left 07/11/2016   Procedure: CYSTOSCOPY WITH STENT PLACEMENT;  Surgeon: Nickie Retort, MD;  Location: [facility];  Service: Urology;  Laterality: Left;  . CYSTOSCOPY WITH URETEROSCOPY Left 07/11/2016  Procedure: CYSTOSCOPY WITH URETEROSCOPY;  Surgeon: Nickie Retort, MD;  Location: [facility];  Service: Urology;  Laterality: Left;  . INSERTION OF DIALYSIS CATHETER  06-23-2015  at [facility]   Tenckhoff coiled peritoneal catheter  . 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 TRANSTHORACIC ECHOCARDIOGRAM  10/15/2015   poor acoustic windows limit study-  ef 50% with akinesis of the basal posterior, inferior , and lateral walls,  grade 1 diastolic dysfunction/;   . WRIST ARTHROSCOPY W/ TRIANGULAR FIBROCARTILAGE REPAIR Right 03/10/2010      Social History:  reports that she has never smoked. She has never used smokeless tobacco. She reports that she does not drink alcohol and does not use drugs.  Family History: family history includes Diabetes in an other family member; Heart attack in her father; Hypertension in an other family member; Renal Disease in her mother.   HOME MEDICATIONS: Allergies as of 01/14/2021      Reactions   Amoxicillin-pot Clavulanate Nausea Only   Other reaction(s): Vomiting   Other    Other reaction(s): Other (See Comments) S/P Renal Transplant Other reaction(s): Unknown Other reaction(s): Unknown   Aspirin Hives   Other reaction(s): Unknown   Tetracyclines & Related Hives, Other (See Comments), Nausea Only   GI intolerance -"Messes with stomach" Other reaction(s): Unknown Messes with stomach   Statins    Other reaction(s): Unknown      Medication List       Accurate as of Jan 14, 2021  1:34 PM. If you have any questions, ask your nurse or doctor.        STOP taking these medications   acetaminophen 500 MG tablet Commonly known as: TYLENOL Stopped by: Dorita Sciara, MD   predniSONE 5 MG tablet Commonly known as: DELTASONE Stopped by: Dorita Sciara, MD     TAKE these medications   albuterol 108 (90 Base) MCG/ACT inhaler Commonly known as: VENTOLIN HFA Inhale 2 puffs into the lungs every 6 (six) hours as needed for wheezing or shortness of breath (q 4h for wheezing and cough x 7 days). Please educate how to use   amLODipine 2.5 MG tablet Commonly known as: NORVASC Take 2.5 mg by mouth 2 (two) times daily.   Envarsus XR 1 MG Tb24 Generic drug: Tacrolimus ER Take by mouth. 2mg  tab daily   mycophenolate 250 MG capsule Commonly known as: CELLCEPT Take 250 mg by mouth 2 (two) times daily. 9am and 9pm   Prenatal (w/Iron & FA) 27-0.8 MG Tabs Take 1 tablet by mouth daily.    Propylene Glycol 0.6 % Soln Apply to eye.   Synthroid 125 MCG tablet Generic drug: levothyroxine Take 1 tablet (125 mcg total) by mouth daily before breakfast.   Vitamin B12 1000 MCG Tbcr Take by mouth.   Vitamin D3 125 MCG (5000 UT) Caps Take by mouth.         REVIEW OF SYSTEMS: A comprehensive ROS was conducted with the patient and is negative except as per HPI    OBJECTIVE:  VS: BP 130/76   Pulse (!) 105   Ht 5\' 3"  (1.6 m)   Wt 170 lb 2 oz (77.2 kg)   SpO2 99%   BMI 30.14 kg/m    Wt Readings from Last 3 Encounters:  01/14/21 170 lb 2 oz (77.2 kg)  12/13/20 172 lb 3.2 oz (78.1 kg)  06/24/20 173 lb 12.8 oz (78.8 kg)     EXAM:  General: Pt appears well and is in NAD  Eyes: External eye exam normal with a stare but no  lid lag or exophthalmos.  EOM intact.   Neck: General: Supple without adenopathy. Thyroid: Thyroid size normal.  No goiter or nodules appreciated.   Lungs: Clear with good BS bilat with no rales, rhonchi, or wheezes  Heart: Auscultation: RRR.  Abdomen: Normoactive bowel sounds, soft, nontender, without masses or organomegaly palpable  Extremities:  BL LE: No pretibial edema normal ROM and strength.  Skin: Hair: Texture and amount normal with gender appropriate distribution Skin Inspection: No rashes Skin Palpation: Skin temperature, texture, and thickness normal to palpation  Neuro: Cranial nerves: II - XII grossly intact  Motor: Normal strength throughout DTRs: 2+ and symmetric in UE without delay in relaxation phase  Mental Status: Judgment, insight: Intact Orientation: Oriented to time, place, and person Mood and affect: No depression, anxiety, or agitation     DATA REVIEWED:   Results for ROSANGELA, FEHRENBACH (MRN [id number]) as of 01/14/2021 13:13  Ref. Range 12/13/2020 09:23  TSH Latest Ref Range: 0.450 - 4.500 uIU/mL 0.231 (L)  Triiodothyronine (T3) Latest Ref Range: 71 - 180 ng/dL 64 (L)  T4,Free(Direct) Latest Ref Range: 0.82 -  1.77 ng/dL 1.75    ASSESSMENT/PLAN/RECOMMENDATIONS:   1. Postablative Hypothyroidism:  - Pt is clinically euthyroid  - NO local neck symptoms  - - Pt educated extensively on the correct way to take levothyroxine (first thing in the morning with water, 30 minutes before eating or taking other medications). - Pt encouraged to double dose the following day if she were to miss a dose given long half-life of levothyroxine. - Her dose was recently reduced from 150 mcg so its too soon to check today. - she was advised to  hold Biotin 2-3 days prior to future thyroid function check    Medications : Continue Synthroid 125 mcg daily    2. Hx of renal transplant:  - She has self discontinued Prednisone after reading side effects. Her nephrologist has recommended restarting it.  - We discussed benefits of prednisone in the setting of transplant and I have recommended restarting prednisone as she is on small dose  - I have also encouraged her to do research on trans[plant and prednisone rather then just prednisone   F/U in 4 months Labs in 8 weeks   Signed electronically by: Mack Guise, MD  [facility]  [address] [place],  [address] Phone: [phone] FAX: [phone]   CC: Glendale Chard, [address] [place] [address] Phone: [phone] Fax: [phone]   Return to Endocrinology clinic as below: Future Appointments  Date Time Provider [place]  06/20/2021 10:30 AM Glendale Chard, MD [facility] None

## 2021-01-14 NOTE — Patient Instructions (Signed)
-   Please HOLD Biotin 2-3 days before any thyroid blood test    -You are on Synthroid - which is your thyroid hormone supplement. You MUST take this consistently.  You should take this first thing in the morning on an empty stomach with water. You should not take it with other medications. Wait 74min to 1hr prior to eating. If you are taking any vitamins - please take these in the evening.   If you miss a dose, please take your missed dose the following day (double the dose for that day). You should have a pill box for ONLY synthroid  on your bedside table to help you remember to take your medications.

## 2021-02-15 DIAGNOSIS — Z940 Kidney transplant status: Secondary | ICD-10-CM | POA: Diagnosis not present

## 2021-02-22 ENCOUNTER — Telehealth: Payer: Self-pay | Admitting: Internal Medicine

## 2021-02-22 ENCOUNTER — Telehealth: Payer: Self-pay

## 2021-02-22 NOTE — Telephone Encounter (Signed)
Left message for patient to call back and schedule Medicare Annual Wellness Visit (AWV) either virtually or in office.   Last AWV 12/24/19  please schedule at anytime with [facility]    This should be a 45 minute visit.

## 2021-02-22 NOTE — Telephone Encounter (Signed)
The pt was notified that the call she was returning was about the pt scheduling her annual wellness visit for the year.  The pt said that she's driving and will have to call back to schedule.

## 2021-03-01 DIAGNOSIS — Z940 Kidney transplant status: Secondary | ICD-10-CM | POA: Diagnosis not present

## 2021-03-15 ENCOUNTER — Telehealth: Payer: Self-pay | Admitting: Internal Medicine

## 2021-03-15 NOTE — Telephone Encounter (Signed)
Left message for patient to call back and schedule Medicare Annual Wellness Visit (AWV) either virtually or in office.   Last AWV ;12/24/19  please schedule at anytime with [facility]    This should be a 45 minute visit.

## 2021-03-24 ENCOUNTER — Other Ambulatory Visit: Payer: Self-pay

## 2021-03-24 DIAGNOSIS — E039 Hypothyroidism, unspecified: Secondary | ICD-10-CM

## 2021-03-24 MED ORDER — SYNTHROID 125 MCG PO TABS: 125.0000 ug | ORAL_TABLET | Freq: Every day | ORAL | 0 refills | Status: DC

## 2021-03-30 DIAGNOSIS — E0500 Thyrotoxicosis with diffuse goiter without thyrotoxic crisis or storm: Secondary | ICD-10-CM | POA: Diagnosis not present

## 2021-03-30 DIAGNOSIS — E890 Postprocedural hypothyroidism: Secondary | ICD-10-CM | POA: Diagnosis not present

## 2021-04-08 ENCOUNTER — Telehealth: Payer: Self-pay | Admitting: Internal Medicine

## 2021-04-08 NOTE — Telephone Encounter (Signed)
Left message for patient to call back and schedule Medicare Annual Wellness Visit (AWV) either virtually or in office.  Left both my jabber number [phone] and office number    Last AWV 12/24/19  please schedule at anytime with [facility]    This should be a 45 minute visit.

## 2021-04-13 DIAGNOSIS — E890 Postprocedural hypothyroidism: Secondary | ICD-10-CM | POA: Diagnosis not present

## 2021-04-18 DIAGNOSIS — Z8701 Personal history of pneumonia (recurrent): Secondary | ICD-10-CM | POA: Diagnosis not present

## 2021-04-18 DIAGNOSIS — D84821 Immunodeficiency due to drugs: Secondary | ICD-10-CM | POA: Diagnosis not present

## 2021-04-18 DIAGNOSIS — Z7952 Long term (current) use of systemic steroids: Secondary | ICD-10-CM | POA: Diagnosis not present

## 2021-04-18 DIAGNOSIS — Z4822 Encounter for aftercare following kidney transplant: Secondary | ICD-10-CM | POA: Diagnosis not present

## 2021-04-18 DIAGNOSIS — Z940 Kidney transplant status: Secondary | ICD-10-CM | POA: Diagnosis not present

## 2021-04-18 DIAGNOSIS — M7918 Myalgia, other site: Secondary | ICD-10-CM | POA: Diagnosis not present

## 2021-04-18 DIAGNOSIS — N183 Chronic kidney disease, stage 3 unspecified: Secondary | ICD-10-CM | POA: Diagnosis not present

## 2021-04-18 DIAGNOSIS — R110 Nausea: Secondary | ICD-10-CM | POA: Diagnosis not present

## 2021-04-18 DIAGNOSIS — J9811 Atelectasis: Secondary | ICD-10-CM | POA: Diagnosis not present

## 2021-04-18 DIAGNOSIS — Z8616 Personal history of COVID-19: Secondary | ICD-10-CM | POA: Diagnosis not present

## 2021-04-18 DIAGNOSIS — Z8619 Personal history of other infectious and parasitic diseases: Secondary | ICD-10-CM | POA: Diagnosis not present

## 2021-04-18 DIAGNOSIS — R197 Diarrhea, unspecified: Secondary | ICD-10-CM | POA: Diagnosis not present

## 2021-04-18 DIAGNOSIS — I129 Hypertensive chronic kidney disease with stage 1 through stage 4 chronic kidney disease, or unspecified chronic kidney disease: Secondary | ICD-10-CM | POA: Diagnosis not present

## 2021-04-18 DIAGNOSIS — I10 Essential (primary) hypertension: Secondary | ICD-10-CM | POA: Diagnosis not present

## 2021-04-18 DIAGNOSIS — Z79899 Other long term (current) drug therapy: Secondary | ICD-10-CM | POA: Diagnosis not present

## 2021-04-18 DIAGNOSIS — D849 Immunodeficiency, unspecified: Secondary | ICD-10-CM | POA: Diagnosis not present

## 2021-05-13 DIAGNOSIS — Z1231 Encounter for screening mammogram for malignant neoplasm of breast: Secondary | ICD-10-CM | POA: Diagnosis not present

## 2021-05-13 LAB — HM MAMMOGRAPHY

## 2021-05-19 ENCOUNTER — Telehealth: Payer: Self-pay | Admitting: Internal Medicine

## 2021-05-19 NOTE — Telephone Encounter (Signed)
Left message for patient to call back and schedule Medicare Annual Wellness Visit (AWV) either virtually or in office.  Left both my jabber number [phone] and office number    Last AWV 12/24/19 ; please schedule at anytime with [facility]    This should be a 45 minute visit.

## 2021-05-26 ENCOUNTER — Ambulatory Visit (INDEPENDENT_AMBULATORY_CARE_PROVIDER_SITE_OTHER): Payer: MEDICARE

## 2021-05-26 ENCOUNTER — Other Ambulatory Visit: Payer: Self-pay

## 2021-05-26 VITALS — Ht 63.5 in | Wt 178.0 lb

## 2021-05-26 DIAGNOSIS — Z0000 Encounter for general adult medical examination without abnormal findings: Secondary | ICD-10-CM

## 2021-05-26 MED ORDER — ALBUTEROL SULFATE HFA 108 (90 BASE) MCG/ACT IN AERS
2.0000 | INHALATION_SPRAY | Freq: Four times a day (QID) | RESPIRATORY_TRACT | 0 refills | Status: DC | PRN
Start: 2021-05-26 — End: 2022-02-06

## 2021-05-26 NOTE — Patient Instructions (Signed)
Yvette Jenkins , Thank you for taking time to come for your Medicare Wellness Visit. I appreciate your ongoing commitment to your health goals. Please review the following plan we discussed and let me know if I can assist you in the future.   Screening recommendations/referrals: Colonoscopy: completed 05/06/2011 Mammogram: completed 05/2021 per patient Bone Density: n/a Recommended yearly ophthalmology/optometry visit for glaucoma screening and checkup Recommended yearly dental visit for hygiene and checkup  Vaccinations: Influenza vaccine: decline Pneumococcal vaccine: completed 09/15/2015 Tdap vaccine: completed 08/06/2013, due 08/07/2023 Shingles vaccine: discussed  Covid-19:  07/12/2020  Advanced directives: Please bring a copy of your POA (Power of Attorney) and/or Living Will to your next appointment.   Conditions/risks identified: none  Next appointment: Follow up in one year for your annual wellness visit.   Preventive Care 40-64 Years, Female Preventive care refers to lifestyle choices and visits with your health care provider that can promote health and wellness. What does preventive care include? A yearly physical exam. This is also called an annual well check. Dental exams once or twice a year. Routine eye exams. Ask your health care provider how often you should have your eyes checked. Personal lifestyle choices, including: Daily care of your teeth and gums. Regular physical activity. Eating a healthy diet. Avoiding tobacco and drug use. Limiting alcohol use. Practicing safe sex. Taking low-dose aspirin daily starting at age 47. Taking vitamin and mineral supplements as recommended by your health care provider. What happens during an annual well check? The services and screenings done by your health care provider during your annual well check will depend on your age, overall health, lifestyle risk factors, and family history of disease. Counseling  Your health care provider  may ask you questions about your: Alcohol use. Tobacco use. Drug use. Emotional well-being. Home and relationship well-being. Sexual activity. Eating habits. Work and work Statistician. Method of birth control. Menstrual cycle. Pregnancy history. Screening  You may have the following tests or measurements: Height, weight, and BMI. Blood pressure. Lipid and cholesterol levels. These may be checked every 5 years, or more frequently if you are over 49 years old. Skin check. Lung cancer screening. You may have this screening every year starting at age 55 if you have a 30-pack-year history of smoking and currently smoke or have quit within the past 15 years. Fecal occult blood test (FOBT) of the stool. You may have this test every year starting at age 67. Flexible sigmoidoscopy or colonoscopy. You may have a sigmoidoscopy every 5 years or a colonoscopy every 10 years starting at age 42. Hepatitis C blood test. Hepatitis B blood test. Sexually transmitted disease (STD) testing. Diabetes screening. This is done by checking your blood sugar (glucose) after you have not eaten for a while (fasting). You may have this done every 1-3 years. Mammogram. This may be done every 1-2 years. Talk to your health care provider about when you should start having regular mammograms. This may depend on whether you have a family history of breast cancer. BRCA-related cancer screening. This may be done if you have a family history of breast, ovarian, tubal, or peritoneal cancers. Pelvic exam and Pap test. This may be done every 3 years starting at age 45. Starting at age 71, this may be done every 5 years if you have a Pap test in combination with an HPV test. Bone density scan. This is done to screen for osteoporosis. You may have this scan if you are at high risk for osteoporosis. Discuss  your test results, treatment options, and if necessary, the need for more tests with your health care provider. Vaccines   Your health care provider may recommend certain vaccines, such as: Influenza vaccine. This is recommended every year. Tetanus, diphtheria, and acellular pertussis (Tdap, Td) vaccine. You may need a Td booster every 10 years. Zoster vaccine. You may need this after age 79. Pneumococcal 13-valent conjugate (PCV13) vaccine. You may need this if you have certain conditions and were not previously vaccinated. Pneumococcal polysaccharide (PPSV23) vaccine. You may need one or two doses if you smoke cigarettes or if you have certain conditions. Talk to your health care provider about which screenings and vaccines you need and how often you need them. This information is not intended to replace advice given to you by your health care provider. Make sure you discuss any questions you have with your health care provider. Document Released: 09/17/2015 Document Revised: 05/10/2016 Document Reviewed: 06/22/2015 Elsevier Interactive Patient Education  2017 [place] Prevention in the Home Falls can cause injuries. They can happen to people of all ages. There are many things you can do to make your home safe and to help prevent falls. What can I do on the outside of my home? Regularly fix the edges of walkways and driveways and fix any cracks. Remove anything that might make you trip as you walk through a door, such as a raised step or threshold. Trim any bushes or trees on the path to your home. Use bright outdoor lighting. Clear any walking paths of anything that might make someone trip, such as rocks or tools. Regularly check to see if handrails are loose or broken. Make sure that both sides of any steps have handrails. Any raised decks and porches should have guardrails on the edges. Have any leaves, snow, or ice cleared regularly. Use sand or salt on walking paths during winter. Clean up any spills in your garage right away. This includes oil or grease spills. What can I do in the  bathroom? Use night lights. Install grab bars by the toilet and in the tub and shower. Do not use towel bars as grab bars. Use non-skid mats or decals in the tub or shower. If you need to sit down in the shower, use a plastic, non-slip stool. Keep the floor dry. Clean up any water that spills on the floor as soon as it happens. Remove soap buildup in the tub or shower regularly. Attach bath mats securely with double-sided non-slip rug tape. Do not have throw rugs and other things on the floor that can make you trip. What can I do in the bedroom? Use night lights. Make sure that you have a light by your bed that is easy to reach. Do not use any sheets or blankets that are too big for your bed. They should not hang down onto the floor. Have a firm chair that has side arms. You can use this for support while you get dressed. Do not have throw rugs and other things on the floor that can make you trip. What can I do in the kitchen? Clean up any spills right away. Avoid walking on wet floors. Keep items that you use a lot in easy-to-reach places. If you need to reach something above you, use a strong step stool that has a grab bar. Keep electrical cords out of the way. Do not use floor polish or wax that makes floors slippery. If you must use wax, use  non-skid floor wax. Do not have throw rugs and other things on the floor that can make you trip. What can I do with my stairs? Do not leave any items on the stairs. Make sure that there are handrails on both sides of the stairs and use them. Fix handrails that are broken or loose. Make sure that handrails are as long as the stairways. Check any carpeting to make sure that it is firmly attached to the stairs. Fix any carpet that is loose or worn. Avoid having throw rugs at the top or bottom of the stairs. If you do have throw rugs, attach them to the floor with carpet tape. Make sure that you have a light switch at the top of the stairs and the  bottom of the stairs. If you do not have them, ask someone to add them for you. What else can I do to help prevent falls? Wear shoes that: Do not have high heels. Have rubber bottoms. Are comfortable and fit you well. Are closed at the toe. Do not wear sandals. If you use a stepladder: Make sure that it is fully opened. Do not climb a closed stepladder. Make sure that both sides of the stepladder are locked into place. Ask someone to hold it for you, if possible. Clearly mark and make sure that you can see: Any grab bars or handrails. First and last steps. Where the edge of each step is. Use tools that help you move around (mobility aids) if they are needed. These include: Canes. Walkers. Scooters. Crutches. Turn on the lights when you go into a dark area. Replace any light bulbs as soon as they burn out. Set up your furniture so you have a clear path. Avoid moving your furniture around. If any of your floors are uneven, fix them. If there are any pets around you, be aware of where they are. Review your medicines with your doctor. Some medicines can make you feel dizzy. This can increase your chance of falling. Ask your doctor what other things that you can do to help prevent falls. This information is not intended to replace advice given to you by your health care provider. Make sure you discuss any questions you have with your health care provider. Document Released: 06/17/2009 Document Revised: 01/27/2016 Document Reviewed: 09/25/2014 Elsevier Interactive Patient Education  2017 [employer].

## 2021-05-26 NOTE — Progress Notes (Signed)
I connected with Yvette Jenkins today by telephone and verified that I am speaking with the correct person using two identifiers. Location patient: home Location provider: work Persons participating in the virtual visit: [name], [name].   I discussed the limitations, risks, security and privacy concerns of performing an evaluation and management service by telephone and the availability of in person appointments. I also discussed with the patient that there may be a patient responsible charge related to this service. The patient expressed understanding and verbally consented to this telephonic visit.    Interactive audio and video telecommunications were attempted between this provider and patient, however failed, due to patient having technical difficulties OR patient did not have access to video capability.  We continued and completed visit with audio only.     Vital signs may be patient reported or missing.  Subjective:   Yvette Jenkins is a 65 y.o. female who presents for Medicare Annual (Subsequent) preventive examination.  Review of Systems     Cardiac Risk Factors include: hypertension;obesity (BMI >30kg/m2);sedentary lifestyle     Objective:    Today's Vitals   05/26/21 0826  Weight: 178 lb (80.7 kg)  Height: 5' 3.5" (1.613 m)   Body mass index is 31.04 kg/m.  Advanced Directives 05/26/2021 04/16/2020 04/15/2020 12/24/2019 06/25/2019 03/20/2019 03/19/2019  Does Patient Have a Medical Advance Directive? Yes No No Yes Yes No No  Type of Advance Directive Healthcare Power of [place] - -  Does patient want to make changes to medical advance directive? - - - - Yes (ED - Information included in AVS) - -  Copy of [place] in Chart? No - copy requested - - No - copy requested - - -  Would patient like information on creating a medical advance directive? - No - Patient declined No  - Patient declined - Yes (ED - Information included in AVS) No - Patient declined -    Current Medications (verified) Outpatient Encounter Medications as of 05/26/2021  Medication Sig   albuterol (VENTOLIN HFA) 108 (90 Base) MCG/ACT inhaler Inhale 2 puffs into the lungs every 6 (six) hours as needed for wheezing or shortness of breath (q 4h for wheezing and cough x 7 days). Please educate how to use   amLODipine (NORVASC) 2.5 MG tablet Take 2.5 mg by mouth 2 (two) times daily.   Cholecalciferol (VITAMIN D3) 125 MCG (5000 UT) CAPS Take by mouth.   Cyanocobalamin (VITAMIN B12) 1000 MCG TBCR Take by mouth.   mycophenolate (CELLCEPT) 250 MG capsule Take 250 mg by mouth 2 (two) times daily. 9am and 9pm   Prenatal Multivit-Min-Fe-FA (PRENATAL, W/IRON & FA,) 27-0.8 MG TABS Take 1 tablet by mouth daily.   Propylene Glycol 0.6 % SOLN Apply to eye.   SYNTHROID 125 MCG tablet Take 1 tablet (125 mcg total) by mouth daily before breakfast.   Tacrolimus ER (ENVARSUS XR) 1 MG TB24 Take by mouth. 2mg  tab daily   No facility-administered encounter medications on file as of 05/26/2021.    Allergies (verified) Amoxicillin-pot clavulanate, Other, Aspirin, Tetracyclines & related, and Statins   History: Past Medical History:  Diagnosis Date   Acquired renal cyst of right kidney    Anemia associated with chronic renal failure    IV Iron infusion    Arthritis    Coronary artery disease    per pt cardiac cath at [facility] 10-26-2015  non-obstructive cad of one  vessel   End-stage renal disease on peritoneal dialysis [facility])    nephrologist-  dr Justin Mend [facility])  daily dialysis start 7-8pm to 7-8am-- currently on kidney transplant list at [facility]   Gross hematuria    History of Graves' disease    Hypertension    Hypothyroidism, postradioiodine therapy    Kidney transplant candidate    on waiting list at [facility]   Left nephrolithiasis    non-obstructive per pt    PONV (postoperative nausea and vomiting)     Secondary hyperparathyroidism of renal origin [facility])    Past Surgical History:  Procedure Laterality Date   CARDIAC CATHETERIZATION  11-17-2015   [facility]- dr Marcello Moores povsic   abnormal stress test w/ ischemia (received from nephrologist dr Justin Mend office) non-obstrucitve CAD- diffuse mid to distal LAD 60% and insignificant RCA,  stress test does not really match up with this lesion and this lesion is likely to remain insignificant   CARDIOVASCULAR STRESS TEST  10/15/2015   Intermediate risk nuclear study w/ moderate size and intensity, basal to mid inferior wall reversible perfusion defect suggestive of ischemia/  ef 57%,  inferior and inferoseptal hypokinesis to dyskinesis   CESAREAN SECTION  1987  and 1976   CYSTOSCOPY W/ RETROGRADES Bilateral 07/11/2016   Procedure: CYSTOSCOPY WITH RETROGRADE PYELOGRAM;  Surgeon: Nickie Retort, MD;  Location: [facility];  Service: Urology;  Laterality: Bilateral;   CYSTOSCOPY WITH BIOPSY Left 07/11/2016   Procedure: CYSTOSCOPY WITH  LEFT RENAL PELVIS BIOPSY;  Surgeon: Nickie Retort, MD;  Location: [facility];  Service: Urology;  Laterality: Left;   CYSTOSCOPY WITH STENT PLACEMENT Left 07/11/2016   Procedure: CYSTOSCOPY WITH STENT PLACEMENT;  Surgeon: Nickie Retort, MD;  Location: [facility];  Service: Urology;  Laterality: Left;   CYSTOSCOPY WITH URETEROSCOPY Left 07/11/2016   Procedure: CYSTOSCOPY WITH URETEROSCOPY;  Surgeon: Nickie Retort, MD;  Location: [facility];  Service: Urology;  Laterality: Left;   INSERTION OF DIALYSIS CATHETER  06-23-2015  at [facility]   Tenckhoff coiled peritoneal catheter   KIDNEY TRANSPLANT N/A 05/13/2018   Added a kidney to the bladder   TRANSTHORACIC ECHOCARDIOGRAM  10/15/2015   poor acoustic windows limit study-  ef 50% with akinesis of the basal posterior, inferior , and lateral walls,  grade 1 diastolic dysfunction/;   WRIST ARTHROSCOPY W/  TRIANGULAR FIBROCARTILAGE REPAIR Right 03/10/2010   Family History  Problem Relation Age of Onset   Hypertension Other    Diabetes Other    Renal Disease Mother    Heart attack Father    Social History   Socioeconomic History   Marital status: Married    Spouse name: Not on file   Number of children: Not on file   Years of education: Not on file   Highest education level: Not on file  Occupational History   Occupation: Child care  Tobacco Use   Smoking status: Never   Smokeless tobacco: Never  Vaping Use   Vaping Use: Never used  Substance and Sexual Activity   Alcohol use: No   Drug use: No   Sexual activity: Not Currently  Other Topics Concern   Not on file  Social History Narrative   Not on file   Social Determinants of Health   Financial Resource Strain: Low Risk    Difficulty of Paying Living Expenses: Not hard at all  Food Insecurity: No Food Insecurity   Worried About Running Out of  Food in the Last Year: Never true   [place] in the Last Year: Never true  Transportation Needs: No Transportation Needs   Lack of Transportation (Medical): No   Lack of Transportation (Non-Medical): No  Physical Activity: Inactive   Days of Exercise per Week: 0 days   Minutes of Exercise per Session: 0 min  Stress: No Stress Concern Present   Feeling of Stress : Not at all  Social Connections: Not on file    Tobacco Counseling Counseling given: Not Answered   Clinical Intake:  Pre-visit preparation completed: Yes  Pain : No/denies pain     Nutritional Status: BMI > 30  Obese Nutritional Risks: Nausea/ vomitting/ diarrhea (some nausea has resolved) Diabetes: No  How often do you need to have someone help you when you read instructions, pamphlets, or other written materials from your doctor or pharmacy?: 1 - Never What is the last grade level you completed in school?: college  Diabetic? no  Interpreter Needed?: No  Information entered by :: NAllen  LPN   Activities of Daily Living In your present state of health, do you have any difficulty performing the following activities: 05/26/2021  Hearing? N  Vision? N  Difficulty concentrating or making decisions? Y  Comment some trouble remembering  Walking or climbing stairs? Y  Comment some  Dressing or bathing? N  Doing errands, shopping? N  Preparing Food and eating ? N  Using the Toilet? N  In the past six months, have you accidently leaked urine? N  Do you have problems with loss of bowel control? N  Managing your Medications? N  Managing your Finances? N  Housekeeping or managing your Housekeeping? N  Some recent data might be hidden    Patient Care Team: Glendale Chard, MD as PCP - General (Internal Medicine) [place], [place], [place] (Optometry) Edrick Oh, MD as Consulting Physician (Nephrology)  Indicate any recent Medical Services you may have received from other than [facility] providers in the past year (date may be approximate).     Assessment:   This is a routine wellness examination for [place].  Hearing/Vision screen Vision Screening - Comments:: Regular eye exams, [facility]  Dietary issues and exercise activities discussed: Current Exercise Habits: The patient does not participate in regular exercise at present   Goals Addressed             This Visit's Progress    Patient Stated       05/26/2021, going to do an intermediate fast       Depression Screen PHQ 2/9 Scores 05/26/2021 12/24/2019 12/24/2019 07/03/2019 04/03/2019 03/19/2019 06/19/2018  PHQ - 2 Score 0 0 0 0 0 0 0  PHQ- 9 Score - 2 - - - - -    Fall Risk Fall Risk  05/26/2021 12/24/2019 12/24/2019 07/03/2019 04/03/2019  Falls in the past year? 0 0 0 0 0  Number falls in past yr: - - 0 0 -  Injury with Fall? - - 0 0 -  Risk for fall due to : Medication side effect Medication side effect - - -  Follow up Falls evaluation completed;Education provided;Falls prevention discussed Falls evaluation  completed;Education provided;Falls prevention discussed - - -    FALL RISK PREVENTION PERTAINING TO THE HOME:  Any stairs in or around the home? No  If so, are there any without handrails? N/a Home free of loose throw rugs in walkways, pet beds, electrical cords, etc? Yes  Adequate lighting in your  home to reduce risk of falls? Yes   ASSISTIVE DEVICES UTILIZED TO PREVENT FALLS:  Life alert? No  Use of a cane, walker or w/c? No  Grab bars in the bathroom? Yes  Shower chair or bench in shower? No  Elevated toilet seat or a handicapped toilet? No   TIMED UP AND GO:  Was the test performed? No .      Cognitive Function:     6CIT Screen 05/26/2021 12/24/2019 06/25/2019 06/19/2018  What Year? 0 points 0 points 0 points 0 points  What month? 0 points 0 points 0 points 0 points  What time? 0 points 0 points 0 points 0 points  Count back from 20 0 points 0 points 0 points 0 points  Months in reverse 2 points 0 points 0 points 0 points  Repeat phrase 0 points 0 points 0 points 0 points  Total Score 2 0 0 0    Immunizations Immunization History  Administered Date(s) Administered   Hepatitis B, adult 07/07/2015, 09/07/2015, 12/07/2015   Hepb-cpg 07/07/2015, 09/07/2015, 12/07/2015   Influenza,inj,Quad PF,6+ Mos 07/14/2019   Moderna Sars-Covid-2 Vaccination 07/12/2020   Pneumococcal Conjugate-13 09/15/2015   Pneumococcal Polysaccharide-23 06/29/2015    TDAP status: Up to date  Flu Vaccine status: Declined, Education has been provided regarding the importance of this vaccine but patient still declined. Advised may receive this vaccine at local pharmacy or Health Dept. Aware to provide a copy of the vaccination record if obtained from local pharmacy or Health Dept. Verbalized acceptance and understanding.  Pneumococcal vaccine status: Up to date  Covid-19 vaccine status: Completed vaccines  Qualifies for Shingles Vaccine? Yes   Zostavax completed No   Shingrix Completed?: No.     Education has been provided regarding the importance of this vaccine. Patient has been advised to call insurance company to determine out of pocket expense if they have not yet received this vaccine. Advised may also receive vaccine at local pharmacy or Health Dept. Verbalized acceptance and understanding.  Screening Tests Health Maintenance  Topic Date Due   Zoster Vaccines- Shingrix (1 of 2) Never done   COVID-19 Vaccine (2 - Moderna risk series) 08/09/2020   INFLUENZA VACCINE  04/04/2021   COLONOSCOPY (Pts 45-9yrs Insurance coverage will need to be confirmed)  05/05/2021   MAMMOGRAM  07/27/2021   PAP SMEAR-Modifier  05/04/2023   TETANUS/TDAP  08/07/2023   Hepatitis C Screening  Completed   HIV Screening  Completed   HPV VACCINES  Aged Out    Health Maintenance  Health Maintenance Due  Topic Date Due   Zoster Vaccines- Shingrix (1 of 2) Never done   COVID-19 Vaccine (2 - Moderna risk series) 08/09/2020   INFLUENZA VACCINE  04/04/2021   COLONOSCOPY (Pts 45-69yrs Insurance coverage will need to be confirmed)  05/05/2021    Colorectal cancer screening: Type of screening: Colonoscopy. Completed 05/06/2011. Repeat every 10 years  Mammogram status: Completed 05/2021. Repeat every year  Bone Density status: n/a  Lung Cancer Screening: (Low Dose CT Chest recommended if Age 78-80 years, 30 pack-year currently smoking OR have quit w/in 15years.) does qualify.   Lung Cancer Screening Referral: no  Additional Screening:  Hepatitis C Screening: does qualify; Completed /05/2018  Vision Screening: Recommended annual ophthalmology exams for early detection of glaucoma and other disorders of the eye. Is the patient up to date with their annual eye exam?  Yes  Who is the provider or what is the name of the office in which the patient  attends annual eye exams? [facility] If pt is not established with a provider, would they like to be referred to a provider to establish care? No .    Dental Screening: Recommended annual dental exams for proper oral hygiene  Community Resource Referral / Chronic Care Management: CRR required this visit?  No   CCM required this visit?  No      Plan:     I have personally reviewed and noted the following in the patient's chart:   Medical and social history Use of alcohol, tobacco or illicit drugs  Current medications and supplements including opioid prescriptions.  Functional ability and status Nutritional status Physical activity Advanced directives List of other physicians Hospitalizations, surgeries, and ER visits in previous 12 months Vitals Screenings to include cognitive, depression, and falls Referrals and appointments  In addition, I have reviewed and discussed with patient certain preventive protocols, quality metrics, and best practice recommendations. A written personalized care plan for preventive services as well as general preventive health recommendations were provided to patient.     Kellie Simmering, LPN   [id number]   Nurse Notes:

## 2021-05-30 ENCOUNTER — Encounter: Payer: Self-pay | Admitting: Internal Medicine

## 2021-06-02 DIAGNOSIS — E039 Hypothyroidism, unspecified: Secondary | ICD-10-CM | POA: Diagnosis not present

## 2021-06-02 DIAGNOSIS — I10 Essential (primary) hypertension: Secondary | ICD-10-CM | POA: Diagnosis not present

## 2021-06-02 DIAGNOSIS — Z940 Kidney transplant status: Secondary | ICD-10-CM | POA: Diagnosis not present

## 2021-06-03 DIAGNOSIS — E890 Postprocedural hypothyroidism: Secondary | ICD-10-CM | POA: Diagnosis not present

## 2021-06-03 DIAGNOSIS — E0500 Thyrotoxicosis with diffuse goiter without thyrotoxic crisis or storm: Secondary | ICD-10-CM | POA: Diagnosis not present

## 2021-06-13 DIAGNOSIS — E0500 Thyrotoxicosis with diffuse goiter without thyrotoxic crisis or storm: Secondary | ICD-10-CM | POA: Diagnosis not present

## 2021-06-20 ENCOUNTER — Ambulatory Visit: Payer: MEDICARE | Admitting: Internal Medicine

## 2021-09-29 DIAGNOSIS — Z5181 Encounter for therapeutic drug level monitoring: Secondary | ICD-10-CM | POA: Diagnosis not present

## 2021-09-29 DIAGNOSIS — Z940 Kidney transplant status: Secondary | ICD-10-CM | POA: Diagnosis not present

## 2021-10-13 DIAGNOSIS — Z940 Kidney transplant status: Secondary | ICD-10-CM | POA: Diagnosis not present

## 2021-10-13 DIAGNOSIS — Z5181 Encounter for therapeutic drug level monitoring: Secondary | ICD-10-CM | POA: Diagnosis not present

## 2021-10-19 DIAGNOSIS — Z23 Encounter for immunization: Secondary | ICD-10-CM | POA: Diagnosis not present

## 2021-10-19 DIAGNOSIS — M19042 Primary osteoarthritis, left hand: Secondary | ICD-10-CM | POA: Diagnosis not present

## 2021-10-19 DIAGNOSIS — N2889 Other specified disorders of kidney and ureter: Secondary | ICD-10-CM | POA: Diagnosis not present

## 2021-10-19 DIAGNOSIS — R202 Paresthesia of skin: Secondary | ICD-10-CM | POA: Diagnosis not present

## 2021-10-19 DIAGNOSIS — M79604 Pain in right leg: Secondary | ICD-10-CM | POA: Diagnosis not present

## 2021-10-19 DIAGNOSIS — Z5181 Encounter for therapeutic drug level monitoring: Secondary | ICD-10-CM | POA: Diagnosis not present

## 2021-10-19 DIAGNOSIS — I10 Essential (primary) hypertension: Secondary | ICD-10-CM | POA: Diagnosis not present

## 2021-10-19 DIAGNOSIS — Z940 Kidney transplant status: Secondary | ICD-10-CM | POA: Diagnosis not present

## 2021-10-19 DIAGNOSIS — R5383 Other fatigue: Secondary | ICD-10-CM | POA: Diagnosis not present

## 2021-10-19 DIAGNOSIS — D84821 Immunodeficiency due to drugs: Secondary | ICD-10-CM | POA: Diagnosis not present

## 2021-10-19 DIAGNOSIS — Z4822 Encounter for aftercare following kidney transplant: Secondary | ICD-10-CM | POA: Diagnosis not present

## 2021-10-19 DIAGNOSIS — Z79899 Other long term (current) drug therapy: Secondary | ICD-10-CM | POA: Diagnosis not present

## 2021-10-19 DIAGNOSIS — E039 Hypothyroidism, unspecified: Secondary | ICD-10-CM | POA: Diagnosis not present

## 2021-10-19 DIAGNOSIS — R1031 Right lower quadrant pain: Secondary | ICD-10-CM | POA: Diagnosis not present

## 2021-10-19 DIAGNOSIS — N281 Cyst of kidney, acquired: Secondary | ICD-10-CM | POA: Diagnosis not present

## 2021-10-19 DIAGNOSIS — R109 Unspecified abdominal pain: Secondary | ICD-10-CM | POA: Diagnosis not present

## 2021-10-19 DIAGNOSIS — M79605 Pain in left leg: Secondary | ICD-10-CM | POA: Diagnosis not present

## 2021-10-19 DIAGNOSIS — Z8616 Personal history of COVID-19: Secondary | ICD-10-CM | POA: Diagnosis not present

## 2021-10-19 DIAGNOSIS — E785 Hyperlipidemia, unspecified: Secondary | ICD-10-CM | POA: Diagnosis not present

## 2021-10-26 DIAGNOSIS — R109 Unspecified abdominal pain: Secondary | ICD-10-CM | POA: Diagnosis not present

## 2021-10-26 DIAGNOSIS — N200 Calculus of kidney: Secondary | ICD-10-CM | POA: Diagnosis not present

## 2021-10-26 DIAGNOSIS — N281 Cyst of kidney, acquired: Secondary | ICD-10-CM | POA: Diagnosis not present

## 2021-10-26 DIAGNOSIS — N2889 Other specified disorders of kidney and ureter: Secondary | ICD-10-CM | POA: Diagnosis not present

## 2021-11-01 DIAGNOSIS — H40013 Open angle with borderline findings, low risk, bilateral: Secondary | ICD-10-CM | POA: Diagnosis not present

## 2021-11-27 ENCOUNTER — Ambulatory Visit (INDEPENDENT_AMBULATORY_CARE_PROVIDER_SITE_OTHER): Payer: MEDICARE

## 2021-11-27 ENCOUNTER — Ambulatory Visit (HOSPITAL_COMMUNITY)
Admission: EM | Admit: 2021-11-27 | Discharge: 2021-11-27 | Disposition: A | Discharge status: Home / Self Care | Payer: MEDICARE | Attending: Family Medicine | Admitting: Family Medicine

## 2021-11-27 ENCOUNTER — Other Ambulatory Visit: Payer: Self-pay

## 2021-11-27 ENCOUNTER — Encounter (HOSPITAL_COMMUNITY): Payer: Self-pay

## 2021-11-27 DIAGNOSIS — M545 Low back pain, unspecified: Secondary | ICD-10-CM

## 2021-11-27 DIAGNOSIS — M25552 Pain in left hip: Secondary | ICD-10-CM

## 2021-11-27 DIAGNOSIS — M549 Dorsalgia, unspecified: Secondary | ICD-10-CM | POA: Diagnosis not present

## 2021-11-27 MED ORDER — ACETAMINOPHEN-CODEINE #3 300-30 MG PO TABS: 1.0000 | ORAL_TABLET | Freq: Four times a day (QID) | ORAL | 0 refills | Status: DC | PRN

## 2021-11-27 MED ORDER — PREDNISONE 20 MG PO TABS: 40.0000 mg | ORAL_TABLET | Freq: Every day | ORAL | 0 refills | Status: AC

## 2021-11-27 NOTE — ED Triage Notes (Signed)
Pt presents for left-side hip pain x 2 -3 days. She does not report any recent falls or injury. ?

## 2021-11-27 NOTE — Discharge Instructions (Addendum)
The x-rays of your lumbar spine showed some mild arthritis.  The x-rays of the hip did not show arthritis in the hip joint space, we did show some degenerative changes in your pubic bone joint and then the joint between the flight of your back and your pelvis. ? ?Take prednisone 20 mg-2 daily for 5 days ? ?Take Tylenol #3(with codeine)--1 tablet every 6 hours as needed for pain.  You can take extra strength Tylenol instead if you would like.  The Tylenol #3 is tolerated better with food- ?

## 2021-11-27 NOTE — ED Provider Notes (Signed)
?[facility] ? ? ? ?CSN: [id number] ?Arrival date & time: 11/27/21  1002 ? ? ?  ? ?History   ?Chief Complaint ?Chief Complaint  ?Patient presents with  ? Hip Pain  ? ? ?HPI ?Yvette Jenkins is a 66 y.o. female.  ? ? ?Hip Pain ? ?Here for left hip pain that began overnight.  She also has had a little bit of left low back pain at the same time, that has been coming and going overnight.  Yesterday or the day before she had a little right lower back pain that has resolved.  No fever or chills.  No rash ? ?She does have a history of chronic kidney disease and has had a renal transplant in 2019.  She is taking CellCept and tacrolimus in addition to medication for hypertension and hypothyroidism ? ?No trauma or injury ? ?She has been told she has arthritis in her hands ? ?Past Medical History:  ?Diagnosis Date  ? Acquired renal cyst of right kidney   ? Anemia associated with chronic renal failure   ? IV Iron infusion   ? Arthritis   ? Coronary artery disease   ? per pt cardiac cath at [facility] 10-26-2015  non-obstructive cad of one vessel  ? End-stage renal disease on peritoneal dialysis [facility])   ? nephrologist-  dr Justin Mend [facility])  daily dialysis start 7-8pm to 7-8am-- currently on kidney transplant list at [place]  ? Gross hematuria   ? History of Graves' disease   ? Hypertension   ? Hypothyroidism, postradioiodine therapy   ? Kidney transplant candidate   ? on waiting list at [facility]  ? Left nephrolithiasis   ? non-obstructive per pt   ? PONV (postoperative nausea and vomiting)   ? Secondary hyperparathyroidism of renal origin [facility])   ? ? ?Patient Active Problem List  ? Diagnosis Date Noted  ? 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  ? Atherosclerosis of aorta ([place]) 12/13/2020  ? Stage 3 chronic kidney disease ([place]) 12/28/2019  ? Arthralgia 12/28/2019  ? Paresthesia 12/28/2019  ? Myalgia 12/28/2019  ? Hypertensive nephropathy 12/24/2019  ? History of renal transplant 03/20/2019  ? Pneumonia of left lower  lobe due to infectious organism 03/19/2019  ? Diarrhea 03/19/2019  ? CAP (community acquired pneumonia) 03/19/2019  ? Hypothyroid 05/22/2018  ? Vitamin D deficiency 05/22/2018  ? ? ?Past Surgical History:  ?Procedure Laterality Date  ? CARDIAC CATHETERIZATION  11-17-2015   [facility]- dr Marcello Moores povsic  ? abnormal stress test w/ ischemia (received from nephrologist dr webb office) non-obstrucitve CAD- diffuse mid to distal LAD 60% and insignificant RCA,  stress test does not really match up with this lesion and this lesion is likely to remain insignificant  ? CARDIOVASCULAR STRESS TEST  10/15/2015  ? Intermediate risk nuclear study w/ moderate size and intensity, basal to mid inferior wall reversible perfusion defect suggestive of ischemia/  ef 57%,  inferior and inferoseptal hypokinesis to dyskinesis  ? [place]  ? CYSTOSCOPY W/ RETROGRADES Bilateral 07/11/2016  ? Procedure: CYSTOSCOPY WITH RETROGRADE PYELOGRAM;  Surgeon: Nickie Retort, MD;  Location: [facility];  Service: Urology;  Laterality: Bilateral;  ? CYSTOSCOPY WITH BIOPSY Left 07/11/2016  ? Procedure: CYSTOSCOPY WITH  LEFT RENAL PELVIS BIOPSY;  Surgeon: Nickie Retort, MD;  Location: [facility];  Service: Urology;  Laterality: Left;  ? CYSTOSCOPY WITH STENT PLACEMENT Left 07/11/2016  ? Procedure: CYSTOSCOPY WITH STENT PLACEMENT;  Surgeon: Aaron Edelman  Harolyn Rutherford, MD;  Location: [facility];  Service: Urology;  Laterality: Left;  ? CYSTOSCOPY WITH URETEROSCOPY Left 07/11/2016  ? Procedure: CYSTOSCOPY WITH URETEROSCOPY;  Surgeon: Nickie Retort, MD;  Location[facility];  Service: Urology;  Laterality: Left;  ? INSERTION OF DIALYSIS CATHETER  06-23-2015  at [facility]  ? Tenckhoff coiled peritoneal catheter  ? KIDNEY TRANSPLANT N/A 05/13/2018  ? Added a kidney to the bladder  ? TRANSTHORACIC ECHOCARDIOGRAM  10/15/2015  ? poor acoustic windows limit study-  ef 50% with  akinesis of the basal posterior, inferior , and lateral walls,  grade 1 diastolic dysfunction/;  ? WRIST ARTHROSCOPY W/ TRIANGULAR FIBROCARTILAGE REPAIR Right 03/10/2010  ? ? ?OB History   ?No obstetric history on file. ?  ? ? ? ?Home Medications   ? ?Prior to Admission medications   ?Medication Sig Start Date End Date Taking? Authorizing Provider  ?acetaminophen-codeine (TYLENOL #3) 300-30 MG tablet Take 1 tablet by mouth every 6 (six) hours as needed (pain). 11/27/21  Yes Barrett Henle, MD  ?predniSONE (DELTASONE) 20 MG tablet Take 2 tablets (40 mg total) by mouth daily with breakfast for 5 days. 11/27/21 12/02/21 Yes Shyniece Scripter, Gwenlyn Perking, MD  ?albuterol (VENTOLIN HFA) 108 (90 Base) MCG/ACT inhaler Inhale 2 puffs into the lungs every 6 (six) hours as needed for wheezing or shortness of breath. Please educate how to use 05/26/21   Glendale Chard, MD  ?amLODipine (NORVASC) 2.5 MG tablet Take 2.5 mg by mouth 2 (two) times daily. 06/07/20   [provider]  ?Cholecalciferol (VITAMIN D3) 125 MCG (5000 UT) CAPS Take by mouth.    [provider]  ?Cyanocobalamin (VITAMIN B12) 1000 MCG TBCR Take by mouth.    [provider]  ?mycophenolate (CELLCEPT) 250 MG capsule Take 250 mg by mouth 2 (two) times daily. 9am and 9pm    [provider]  ?Prenatal Multivit-Min-Fe-FA (PRENATAL, W/IRON & FA,) 27-0.8 MG TABS Take 1 tablet by mouth daily.    [provider]  ?Propylene Glycol 0.6 % SOLN Apply to eye.    [provider]  ?SYNTHROID 125 MCG tablet Take 1 tablet (125 mcg total) by mouth daily before breakfast. 03/24/21 03/24/22  Minette Brine, [place]  ?Tacrolimus ER (ENVARSUS XR) 1 MG TB24 Take by mouth. '2mg'$  tab daily    [provider]  ? ? ?Family History ?Family History  ?Problem Relation Age of Onset  ? Hypertension Other   ? Diabetes Other   ? Renal Disease Mother   ? Heart attack Father   ? ? ?Social History ?Social History  ? ?Tobacco Use  ? Smoking status: Never  ?  Smokeless tobacco: Never  ?Vaping Use  ? Vaping Use: Never used  ?Substance Use Topics  ? Alcohol use: No  ? Drug use: No  ? ? ? ?Allergies   ?Amoxicillin-pot clavulanate, Other, Aspirin, Tetracyclines & related, and Statins ? ? ?Review of Systems ?Review of Systems ? ? ?Physical Exam ?Triage Vital Signs ?ED Triage Vitals [11/27/21 1013]  ?Enc Vitals Group  ?   BP (!) 153/78  ?   Pulse Rate 89  ?   Resp 16  ?   Temp 99 ?F (37.2 ?C)  ?   Temp Source Oral  ?   SpO2 100 %  ?   Weight   ?   Height   ?   Head Circumference   ?   Peak Flow   ?  Pain Score   ?   Pain Loc   ?   Pain Edu?   ?   Excl. in [place]?   ? ?No data found. ? ?Updated Vital Signs ?BP (!) 153/78 (BP Location: Right Arm)   Pulse 89   Temp 99 ?F (37.2 ?C) (Oral)   Resp 16   SpO2 100%  ? ?Visual Acuity ?Right Eye Distance:   ?Left Eye Distance:   ?Bilateral Distance:   ? ?Right Eye Near:   ?Left Eye Near:    ?Bilateral Near:    ? ?Physical Exam ?Vitals reviewed.  ?Constitutional:   ?   General: She is not in acute distress. ?   Appearance: She is not toxic-appearing.  ?Abdominal:  ?   Palpations: Abdomen is soft.  ?Musculoskeletal:  ?   Right lower leg: No edema.  ?   Left lower leg: No edema.  ?   Comments: There is tenderness of her proximal left thigh.  No erythema, induration, or rash.  Straight leg raise is negative  ?Skin: ?   Coloration: Skin is not jaundiced or pale.  ?   Findings: No rash.  ?Neurological:  ?   Mental Status: She is alert and oriented to person, place, and time.  ?Psychiatric:     ?   Behavior: Behavior normal.  ? ? ? ?UC Treatments / Results  ?Labs ?(all labs ordered are listed, but only abnormal results are displayed) ?Labs Reviewed - No data to display ? ?EKG ? ? ?Radiology ?DG Lumbar Spine 2-3 Views ? ?Result Date: 11/27/2021 ?CLINICAL DATA:  Low back pain EXAM: LUMBAR SPINE - 2-3 VIEW COMPARISON:  None. FINDINGS: Lumbar vertebral body height and alignment are preserved without fracture or spondylolisthesis. Intervertebral  disc spaces are preserved. Facet arthropathy most significant at L4-L5 and L5-S1. Arterial vascular calcifications. IMPRESSION: Mild degenerative changes with no acute osseous abnormality identified. [employer]

## 2021-11-28 DIAGNOSIS — Z940 Kidney transplant status: Secondary | ICD-10-CM | POA: Diagnosis not present

## 2021-11-28 DIAGNOSIS — Z5181 Encounter for therapeutic drug level monitoring: Secondary | ICD-10-CM | POA: Diagnosis not present

## 2021-11-28 DIAGNOSIS — M2550 Pain in unspecified joint: Secondary | ICD-10-CM | POA: Diagnosis not present

## 2021-12-07 DIAGNOSIS — N289 Disorder of kidney and ureter, unspecified: Secondary | ICD-10-CM | POA: Diagnosis not present

## 2021-12-07 DIAGNOSIS — E790 Hyperuricemia without signs of inflammatory arthritis and tophaceous disease: Secondary | ICD-10-CM | POA: Diagnosis not present

## 2021-12-07 DIAGNOSIS — M549 Dorsalgia, unspecified: Secondary | ICD-10-CM | POA: Diagnosis not present

## 2021-12-07 DIAGNOSIS — M5136 Other intervertebral disc degeneration, lumbar region: Secondary | ICD-10-CM | POA: Diagnosis not present

## 2021-12-07 DIAGNOSIS — M1990 Unspecified osteoarthritis, unspecified site: Secondary | ICD-10-CM | POA: Diagnosis not present

## 2021-12-19 DIAGNOSIS — Z23 Encounter for immunization: Secondary | ICD-10-CM | POA: Diagnosis not present

## 2021-12-19 DIAGNOSIS — Z940 Kidney transplant status: Secondary | ICD-10-CM | POA: Diagnosis not present

## 2021-12-19 DIAGNOSIS — B009 Herpesviral infection, unspecified: Secondary | ICD-10-CM | POA: Diagnosis not present

## 2021-12-19 DIAGNOSIS — I700 Atherosclerosis of aorta: Secondary | ICD-10-CM | POA: Diagnosis not present

## 2021-12-19 DIAGNOSIS — I2510 Atherosclerotic heart disease of native coronary artery without angina pectoris: Secondary | ICD-10-CM | POA: Diagnosis not present

## 2021-12-19 DIAGNOSIS — E039 Hypothyroidism, unspecified: Secondary | ICD-10-CM | POA: Diagnosis not present

## 2021-12-19 DIAGNOSIS — Z0000 Encounter for general adult medical examination without abnormal findings: Secondary | ICD-10-CM | POA: Diagnosis not present

## 2021-12-19 DIAGNOSIS — I10 Essential (primary) hypertension: Secondary | ICD-10-CM | POA: Diagnosis not present

## 2022-02-05 ENCOUNTER — Other Ambulatory Visit: Payer: Self-pay | Admitting: Internal Medicine

## 2022-02-13 ENCOUNTER — Encounter (HOSPITAL_COMMUNITY): Payer: Self-pay

## 2022-02-13 ENCOUNTER — Ambulatory Visit (HOSPITAL_COMMUNITY)
Admission: EM | Admit: 2022-02-13 | Discharge: 2022-02-13 | Disposition: A | Discharge status: Home / Self Care | Payer: MEDICARE

## 2022-02-13 DIAGNOSIS — M542 Cervicalgia: Secondary | ICD-10-CM | POA: Diagnosis not present

## 2022-02-13 DIAGNOSIS — M62838 Other muscle spasm: Secondary | ICD-10-CM | POA: Diagnosis not present

## 2022-02-13 MED ORDER — BACLOFEN 5 MG PO TABS: 5.0000 mg | ORAL_TABLET | Freq: Two times a day (BID) | ORAL | 0 refills | Status: DC | PRN

## 2022-02-13 NOTE — Discharge Instructions (Signed)
I believe that you have a muscle spasm of your neck causing your pain.  Continue with heat and gentle stretch as we discussed.  I do recommend follow-up with sports medicine.  Please call to schedule an appointment.  Use baclofen 5 mg up to twice a day.  This make you sleepy so do not drive or drink alcohol with taking it.  If you have any worsening symptoms including increased pain, numbness or tingling in your arms, severe headache, fever, nausea, vomiting you need to go to the emergency room.

## 2022-02-13 NOTE — ED Triage Notes (Signed)
Pt reports neck pain starting upon awakening yesterday morning and worsening.  Difficulty turning head to side or up/down. No known fever. Tylenol taken at 1630.  Pt would like MRI.

## 2022-02-13 NOTE — ED Provider Notes (Signed)
[facility]    CSN: [id number] Arrival date & time: 02/13/22  1827      History   Chief Complaint Chief Complaint  Patient presents with   Torticollis   Headache    HPI Yvette Jenkins is a 66 y.o. female.   Patient presents today with a several day history of left-sided neck pain.  She reports this is worse when she rotates her head or moves in other positions.  Is interfering with her blood perform daily tibias as she is having difficulty driving due to pain.  She reports that pain is minimal at rest but increases to 8/9 with certain movements, described as intense aching with periodic shooting pains, no alleviating factors notified.  She has tried Tylenol without improvement of symptoms.  She denies any known injury or increase in activity prior to symptom onset.  Denies any fever, nausea, vomiting, severe headache, visual disturbance.  She denies history of malignancy.  She does have a history of chronic kidney disease secondary to renal transplant; last metabolic panel obtained March 2023 showed a eGFR of 46.Marland Kitchen  She is taking medication as prescribed.  She denies any history of malignancy.    Past Medical History:  Diagnosis Date   Acquired renal cyst of right kidney    Anemia associated with chronic renal failure    IV Iron infusion    Arthritis    Coronary artery disease    per pt cardiac cath at [facility] 10-26-2015  non-obstructive cad of one vessel   End-stage renal disease on peritoneal dialysis [facility])    nephrologist-  dr Justin Mend [facility])  daily dialysis start 7-8pm to 7-8am-- [facility]   Gross hematuria    History of Graves' disease    Hypertension    Hypothyroidism, postradioiodine therapy    Kidney transplant candidate    on waiting list at [facility]   Left nephrolithiasis    non-obstructive per pt    PONV (postoperative nausea and vomiting)    Secondary hyperparathyroidism of renal origin [facility])      Patient Active Problem List   Diagnosis Date Noted   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   Atherosclerosis of aorta ([place]) 12/13/2020   Stage 3 chronic kidney disease ([place]) 12/28/2019   Arthralgia 12/28/2019   Paresthesia 12/28/2019   Myalgia 12/28/2019   Hypertensive nephropathy 12/24/2019   History of renal transplant 03/20/2019   Pneumonia of left lower lobe due to infectious organism 03/19/2019   Diarrhea 03/19/2019   CAP (community acquired pneumonia) 03/19/2019   Hypothyroid 05/22/2018   Vitamin D deficiency 05/22/2018    Past Surgical History:  Procedure Laterality Date   CARDIAC CATHETERIZATION  11-17-2015   [facility]- dr Marcello Moores povsic   abnormal stress test w/ ischemia (received from nephrologist dr Justin Mend office) non-obstrucitve CAD- diffuse mid to distal LAD 60% and insignificant RCA,  stress test does not really match up with this lesion and this lesion is likely to remain insignificant   CARDIOVASCULAR STRESS TEST  10/15/2015   Intermediate risk nuclear study w/ moderate size and intensity, basal to mid inferior wall reversible perfusion defect suggestive of ischemia/  ef 57%,  inferior and inferoseptal hypokinesis to dyskinesis   CESAREAN SECTION  1987  and 1976   CYSTOSCOPY W/ RETROGRADES Bilateral 07/11/2016   Procedure: CYSTOSCOPY WITH RETROGRADE PYELOGRAM;  Surgeon: Nickie Retort, MD;  Location: [facility];  Service: Urology;  Laterality: Bilateral;   CYSTOSCOPY WITH BIOPSY Left 07/11/2016  Procedure: CYSTOSCOPY WITH  LEFT RENAL PELVIS BIOPSY;  Surgeon: Nickie Retort, MD;  Location: [facility];  Service: Urology;  Laterality: Left;   CYSTOSCOPY WITH STENT PLACEMENT Left 07/11/2016   Procedure: CYSTOSCOPY WITH STENT PLACEMENT;  Surgeon: Nickie Retort, MD;  Location: [facility];  Service: Urology;  Laterality: Left;   CYSTOSCOPY WITH URETEROSCOPY Left 07/11/2016   Procedure: CYSTOSCOPY WITH URETEROSCOPY;   Surgeon: Nickie Retort, MD;  Location: [facility];  Service: Urology;  Laterality: Left;   INSERTION OF DIALYSIS CATHETER  06-23-2015  at [facility]   Tenckhoff coiled peritoneal catheter   KIDNEY TRANSPLANT N/A 05/13/2018   Added a kidney to the bladder   TRANSTHORACIC ECHOCARDIOGRAM  10/15/2015   poor acoustic windows limit study-  ef 50% with akinesis of the basal posterior, inferior , and lateral walls,  grade 1 diastolic dysfunction/;   WRIST ARTHROSCOPY W/ TRIANGULAR FIBROCARTILAGE REPAIR Right 03/10/2010    OB History   No obstetric history on file.      Home Medications    Prior to Admission medications   Medication Sig Start Date End Date Taking? Authorizing Provider  amLODipine (NORVASC) 2.5 MG tablet Take 2.5 mg by mouth 2 (two) times daily. 06/07/20  Yes [provider]  Baclofen 5 MG TABS Take 5 mg by mouth 2 (two) times daily as needed. 02/13/22  Yes Cadel Stairs, Derry Skill, PA-C  Cholecalciferol (VITAMIN D3) 125 MCG (5000 UT) CAPS Take by mouth.   Yes [provider]  Cyanocobalamin (VITAMIN B12) 1000 MCG TBCR Take by mouth.   Yes [provider]  Menaquinone-7 (VITAMIN K2 PO) Take by mouth.   Yes [provider]  mycophenolate (CELLCEPT) 250 MG capsule Take 250 mg by mouth 2 (two) times daily. 9am and 9pm   Yes [provider]  Prenatal Multivit-Min-Fe-FA (PRENATAL, W/IRON & FA,) 27-0.8 MG TABS Take 1 tablet by mouth daily.   Yes [provider]  Propylene Glycol 0.6 % SOLN Apply to eye.   Yes [provider]  SYNTHROID 125 MCG tablet Take 1 tablet (125 mcg total) by mouth daily before breakfast. 03/24/21 03/24/22 Yes Minette Brine, FNP  Tacrolimus ER (ENVARSUS XR) 1 MG TB24 Take by mouth. 73m tab daily   Yes [provider]    Family History Family History  Problem Relation Age of Onset   Hypertension Other    Diabetes Other    Renal Disease Mother    Heart attack Father      Social History Social History   Tobacco Use   Smoking status: Never   Smokeless tobacco: Never  Vaping Use   Vaping Use: Never used  Substance Use Topics   Alcohol use: No   Drug use: No     Allergies   Amoxicillin-pot clavulanate, Other, Aspirin, Tetracyclines & related, and Statins   Review of Systems Review of Systems  Constitutional:  Positive for activity change. Negative for appetite change, fatigue and fever.  Respiratory:  Negative for cough and shortness of breath.   Cardiovascular:  Negative for chest pain.  Gastrointestinal:  Negative for abdominal pain, diarrhea, nausea and vomiting.  Musculoskeletal:  Positive for myalgias and neck pain. Negative for arthralgias.  Neurological:  Negative for dizziness, syncope, weakness, light-headedness and headaches.     Physical Exam Triage Vital Signs ED Triage Vitals  Enc Vitals Group     BP 02/13/22 1957 (!) 157/77     Pulse Rate 02/13/22 1957  100     Resp 02/13/22 1957 18     Temp 02/13/22 1957 98.5 F (36.9 C)     Temp Source 02/13/22 1957 Oral     SpO2 02/13/22 1957 97 %     Weight --      Height --      Head Circumference --      Peak Flow --      Pain Score 02/13/22 1955 7     Pain Loc --      Pain Edu? --      Excl. in [place]? --    No data found.  Updated Vital Signs BP (!) 157/77 (BP Location: Right Arm)   Pulse 100   Temp 98.5 F (36.9 C) (Oral)   Resp 18   SpO2 97%   Visual Acuity Right Eye Distance:   Left Eye Distance:   Bilateral Distance:    Right Eye Near:   Left Eye Near:    Bilateral Near:     Physical Exam Vitals reviewed.  Constitutional:      General: She is awake. She is not in acute distress.    Appearance: Normal appearance. She is well-developed. She is not ill-appearing.     Comments: Very pleasant female appears stated age in no acute distress  HENT:     Head: Normocephalic and atraumatic.  Neck:     Comments: No pain with percussion of vertebrae. Minimal  tenderness to palpation of left cervical paraspinal muscles and left trapezius. Pain with rotation and flexion/extension of neck but full active range of motion.   Cardiovascular:     Rate and Rhythm: Normal rate and regular rhythm.     Heart sounds: Normal heart sounds, S1 normal and S2 normal. No murmur heard. Pulmonary:     Effort: Pulmonary effort is normal.     Breath sounds: Normal breath sounds. No wheezing, rhonchi or rales.     Comments: Clear to auscultation bilaterally  Musculoskeletal:     Cervical back: Neck supple. No torticollis. Pain with movement and muscular tenderness present. No spinous process tenderness. Decreased range of motion.  Psychiatric:        Behavior: Behavior is cooperative.      UC Treatments / Results  Labs (all labs ordered are listed, but only abnormal results are displayed) Labs Reviewed - No data to display  EKG   Radiology No results found.  Procedures Procedures (including critical care time)  Medications Ordered in UC Medications - No data to display  Initial Impression / Assessment and Plan / UC Course  I have reviewed the triage vital signs and the nursing notes.  Pertinent labs & imaging results that were available during my care of the patient were reviewed by me and considered in my medical decision making (see chart for details).     Patient is well-appearing, afebrile, nontoxic, nontachycardic.  Discussed that symptoms are likely musculoskeletal in nature.  We will start baclofen 5 mg given history of renal impairment.  Discussed that this is sedating and she should not drive or drink alcohol with taking this.  She can use Tylenol, heat, stretch for symptom relief.  If symptoms or not improving she is to follow-up with sports medicine for further evaluation and management.  X-rays were deferred as she denies any recent trauma or bony tenderness.  Discussed that if she has any worsening symptoms including weakness, numbness in  her hands, paresthesias, fever, headache, worsening pain she needs to be seen immediately  to which she expressed understanding.  Strict return precautions given.  Work excuse note provided.  Final Clinical Impressions(s) / UC Diagnoses   Final diagnoses:  Muscle spasms of neck  Neck pain     Discharge Instructions      I believe that you have a muscle spasm of your neck causing your pain.  Continue with heat and gentle stretch as we discussed.  I do recommend follow-up with sports medicine.  Please call to schedule an appointment.  Use baclofen 5 mg up to twice a day.  This make you sleepy so do not drive or drink alcohol with taking it.  If you have any worsening symptoms including increased pain, numbness or tingling in your arms, severe headache, fever, nausea, vomiting you need to go to the emergency room.     ED Prescriptions     Medication Sig Dispense Auth. Provider   Baclofen 5 MG TABS Take 5 mg by mouth 2 (two) times daily as needed. 10 tablet [place], Derry Skill, PA-C      PDMP not reviewed this encounter.   Terrilee Croak, PA-C 02/13/22 2036

## 2022-02-14 DIAGNOSIS — M542 Cervicalgia: Secondary | ICD-10-CM | POA: Diagnosis not present

## 2022-02-19 ENCOUNTER — Encounter (HOSPITAL_COMMUNITY): Payer: Self-pay

## 2022-02-19 ENCOUNTER — Emergency Department (HOSPITAL_COMMUNITY): Payer: MEDICARE

## 2022-02-19 ENCOUNTER — Emergency Department (HOSPITAL_COMMUNITY)
Admission: EM | Admit: 2022-02-19 | Discharge: 2022-02-19 | Disposition: A | Discharge status: Home / Self Care | Payer: MEDICARE | Attending: Emergency Medicine | Admitting: Emergency Medicine

## 2022-02-19 DIAGNOSIS — M4722 Other spondylosis with radiculopathy, cervical region: Secondary | ICD-10-CM | POA: Diagnosis not present

## 2022-02-19 DIAGNOSIS — M542 Cervicalgia: Secondary | ICD-10-CM | POA: Diagnosis not present

## 2022-02-19 DIAGNOSIS — M436 Torticollis: Secondary | ICD-10-CM | POA: Insufficient documentation

## 2022-02-19 DIAGNOSIS — M50122 Cervical disc disorder at C5-C6 level with radiculopathy: Secondary | ICD-10-CM | POA: Diagnosis not present

## 2022-02-19 NOTE — ED Notes (Signed)
Patient called for room placement x1 without answer. 

## 2022-02-19 NOTE — Discharge Instructions (Addendum)
You are provided with a copy of your CT scan on today's visit.  There were no acute findings on your CT today.  You will need to follow-up with your primary care physician as scheduled.  If you experience any headache, changes in vision, upper extremity weakness you may return to the emergency department.

## 2022-02-19 NOTE — ED Triage Notes (Signed)
Patient reports that she began having left lateral neck pain a week ago and reports that it is also on the right and posterior area now.  Patient states she was seen at an UC a week ago and her PCP on 6/13. Patient states meds prescribed are not working. Patien tis also concerned that the meds prescribed are not good for her kidney transplant tht she had 2 years ago.  Patient added that she is also nauseated.

## 2022-02-19 NOTE — ED Provider Notes (Signed)
[facility] DEPT Provider Note   CSN: [id number] Arrival date & time: 02/19/22  0900     History Chief Complaint  Patient presents with   Neck Pain    Yvette Jenkins is a 66 y.o. female.  66 year old female with a past medical history of kidney transplant presents to the ED with a chief complaint of left lateral neck pain which began over a week ago.  Patient was evaluated previously by urgent care, also saw her PCP who recommended treatment with baclofen.  She has been taking this medication for approximately 1 week with some improvement in symptoms but no resolution.  Patient is concerned as she is unsure whether this baclofen is causing worsening kidney symptoms.  She reports there has been some relief after applying topical cream, taking the medication.  However, she reports he does not want to be on the medication long-term.  She does report some nausea, this occurs when the pain is so severe along her neck.  There has been no focal upper extremity weakness, no vision changes, no vomiting.  No trauma.  The history is provided by the patient.  Neck Pain Associated symptoms: no chest pain, no fever and no headaches        Home Medications Prior to Admission medications   Medication Sig Start Date End Date Taking? Authorizing Provider  amLODipine (NORVASC) 2.5 MG tablet Take 2.5 mg by mouth 2 (two) times daily. 06/07/20   [provider]  Baclofen 5 MG TABS Take 5 mg by mouth 2 (two) times daily as needed. 02/13/22   Raspet, Derry Skill, PA-C  Cholecalciferol (VITAMIN D3) 125 MCG (5000 UT) CAPS Take by mouth.    [provider]  Cyanocobalamin (VITAMIN B12) 1000 MCG TBCR Take by mouth.    [provider]  Menaquinone-7 (VITAMIN K2 PO) Take by mouth.    [provider]  mycophenolate (CELLCEPT) 250 MG capsule Take 250 mg by mouth 2 (two) times daily. 9am and 9pm    [provider]  Prenatal Multivit-Min-Fe-FA  (PRENATAL, W/IRON & FA,) 27-0.8 MG TABS Take 1 tablet by mouth daily.    [provider]  Propylene Glycol 0.6 % SOLN Apply to eye.    [provider]  SYNTHROID 125 MCG tablet Take 1 tablet (125 mcg total) by mouth daily before breakfast. 03/24/21 03/24/22  Minette Brine, FNP  Tacrolimus ER (ENVARSUS XR) 1 MG TB24 Take by mouth. '2mg'$  tab daily    [provider]      Allergies    Amoxicillin-pot clavulanate, Other, Aspirin, Tetracyclines & related, and Statins    Review of Systems   Review of Systems  Constitutional:  Negative for fever.  Respiratory:  Negative for shortness of breath.   Cardiovascular:  Negative for chest pain.  Gastrointestinal:  Positive for nausea. Negative for vomiting.  Musculoskeletal:  Positive for neck pain.  Neurological:  Negative for headaches.  All other systems reviewed and are negative.   Physical Exam Updated Vital Signs BP (!) 153/88   Pulse 84   Temp 98.9 F (37.2 C) (Oral)   Resp 16   Ht '5\' 3"'$  (1.6 m)   Wt 78.5 kg   SpO2 99%   BMI 30.65 kg/m  Physical Exam Vitals and nursing note reviewed.  Constitutional:      Appearance: Normal appearance.  HENT:     Mouth/Throat:     Mouth: Mucous membranes are moist.  Neck:      Comments:  There is to palpation along the posterior neck muscles.  Exacerbated with any type of movement.  Has decrease in movement due to pain. Cardiovascular:     Rate and Rhythm: Normal rate.  Pulmonary:     Effort: Pulmonary effort is normal.  Abdominal:     General: Abdomen is flat.  Musculoskeletal:     Cervical back: Neck supple. Torticollis present. No rigidity. Pain with movement and muscular tenderness present. No spinous process tenderness. Decreased range of motion.  Skin:    General: Skin is warm and dry.  Neurological:     Mental Status: She is alert and oriented to person, place, and time.     ED Results / Procedures / Treatments   Labs (all labs ordered are listed, but  only abnormal results are displayed) Labs Reviewed - No data to display  EKG None  Radiology CT Cervical Spine Wo Contrast  Result Date: 02/19/2022 CLINICAL DATA:  66 year old female history of left lateral neck pain. Cervical radiculopathy. EXAM: CT CERVICAL SPINE WITHOUT CONTRAST TECHNIQUE: Multidetector CT imaging of the cervical spine was performed without intravenous contrast. Multiplanar CT image reconstructions were also generated. RADIATION DOSE REDUCTION: This exam was performed according to the departmental dose-optimization program which includes automated exposure control, adjustment of the mA and/or kV according to patient size and/or use of iterative reconstruction technique. COMPARISON:  No priors. FINDINGS: Alignment: Normal. Skull base and vertebrae: No acute fracture. No primary bone lesion or focal pathologic process. Soft tissues and spinal canal: No prevertebral fluid or swelling. No visible canal hematoma. Disc levels: Multilevel degenerative disc disease, most pronounced at C5-C6. Mild multilevel facet arthropathy. Upper chest: Unremarkable. Other: None. IMPRESSION: 1. No acute abnormality of the cervical spine. 2. Mild multilevel degenerative disc disease and cervical spondylosis, as above. Electronically Signed   By: Vinnie Langton M.D.   On: 02/19/2022 10:45    Procedures Procedures    Medications Ordered in ED Medications - No data to display  ED Course/ Medical Decision Making/ A&P                           Medical Decision Making Amount and/or Complexity of Data Reviewed Radiology: ordered.   With a chief complaint of neck pain has been ongoing for several weeks.  Evaluated by PCP, also evaluated by urgent care and started on baclofen.  Reports improvement in symptoms after medication, however does not want to be on the medication long-term.  Has good mobility to her neck with some decrease in range of motion due to pain.  No neurological findings such as  [place] weakness, vision changes, changes in her gait or fever to suggest infection.  Also endorses her blood pressure has been elevated due to pain in the last couple of weeks, this normalized after patient had been in the room although still slightly elevated she does report compliance with blood pressure medication.  During my exam is neurologically intact, ambulatory in the ED with a steady gait.  She is able to move upper and lower extremities with good strength.  She is stating that this exam has been performed by multiple providers.  I did discuss with her probably needing to continue her medication regimen along with following up with her primary care physician.  A cervical spine CT was ordered while patient was in triage, this did not show any acute findings, a copy of the CT was provided for patient.  I do feel that  patient is appropriate for outpatient management at this time.  I do not suspect meningitis at this time patient is afebrile, no headache, no vision changes.  Pain is reproducible with movement of her neck, do suspect more so musculoskeletal at this time.  Patient provided with conservative therapy, outpatient follow-up recommended.   Portions of this note were generated with [employer]. Dictation errors may occur despite best attempts at proofreading.   Final Clinical Impression(s) / ED Diagnoses Final diagnoses:  Neck pain    Rx / DC Orders ED Discharge Orders     None         Janeece Fitting, PA-C 02/19/22 1344    Daleen Bo, MD 02/20/22 1411

## 2022-02-28 DIAGNOSIS — Z940 Kidney transplant status: Secondary | ICD-10-CM | POA: Diagnosis not present

## 2022-02-28 DIAGNOSIS — Z5181 Encounter for therapeutic drug level monitoring: Secondary | ICD-10-CM | POA: Diagnosis not present

## 2022-03-10 ENCOUNTER — Ambulatory Visit: Payer: MEDICARE | Admitting: Family Medicine

## 2022-03-15 ENCOUNTER — Encounter: Payer: Self-pay | Admitting: Physician Assistant

## 2022-03-28 ENCOUNTER — Encounter (HOSPITAL_COMMUNITY): Payer: Self-pay

## 2022-03-28 ENCOUNTER — Other Ambulatory Visit: Payer: Self-pay

## 2022-03-28 ENCOUNTER — Other Ambulatory Visit (HOSPITAL_BASED_OUTPATIENT_CLINIC_OR_DEPARTMENT_OTHER): Payer: Self-pay

## 2022-03-28 ENCOUNTER — Emergency Department (HOSPITAL_BASED_OUTPATIENT_CLINIC_OR_DEPARTMENT_OTHER)
Admission: EM | Admit: 2022-03-28 | Discharge: 2022-03-28 | Disposition: A | Discharge status: Home / Self Care | Payer: MEDICARE | Attending: Emergency Medicine | Admitting: Emergency Medicine

## 2022-03-28 ENCOUNTER — Encounter (HOSPITAL_BASED_OUTPATIENT_CLINIC_OR_DEPARTMENT_OTHER): Payer: Self-pay | Admitting: Obstetrics and Gynecology

## 2022-03-28 ENCOUNTER — Emergency Department (HOSPITAL_BASED_OUTPATIENT_CLINIC_OR_DEPARTMENT_OTHER): Payer: MEDICARE | Admitting: Radiology

## 2022-03-28 DIAGNOSIS — R079 Chest pain, unspecified: Secondary | ICD-10-CM | POA: Diagnosis not present

## 2022-03-28 DIAGNOSIS — I10 Essential (primary) hypertension: Secondary | ICD-10-CM | POA: Insufficient documentation

## 2022-03-28 DIAGNOSIS — M940 Chondrocostal junction syndrome [Tietze]: Secondary | ICD-10-CM

## 2022-03-28 DIAGNOSIS — Z79899 Other long term (current) drug therapy: Secondary | ICD-10-CM | POA: Diagnosis not present

## 2022-03-28 LAB — CBC
HCT: 37.8 % (ref 36.0–46.0)
Hemoglobin: 12.1 g/dL (ref 12.0–15.0)
MCH: 26.9 pg (ref 26.0–34.0)
MCHC: 32 g/dL (ref 30.0–36.0)
MCV: 84.2 fL (ref 80.0–100.0)
Platelets: 379 10*3/uL (ref 150–400)
RBC: 4.49 MIL/uL (ref 3.87–5.11)
RDW: 14.6 % (ref 11.5–15.5)
WBC: 8.9 10*3/uL (ref 4.0–10.5)
nRBC: 0 % (ref 0.0–0.2)

## 2022-03-28 LAB — BASIC METABOLIC PANEL
Anion gap: 12 (ref 5–15)
BUN: 20 mg/dL (ref 8–23)
CO2: 24 mmol/L (ref 22–32)
Calcium: 10.6 mg/dL — ABNORMAL HIGH (ref 8.9–10.3)
Chloride: 104 mmol/L (ref 98–111)
Creatinine, Ser: 1.33 mg/dL — ABNORMAL HIGH (ref 0.44–1.00)
GFR, Estimated: 44 mL/min — ABNORMAL LOW (ref >60)
Glucose, Bld: 95 mg/dL (ref 70–99)
Potassium: 4.7 mmol/L (ref 3.5–5.1)
Sodium: 140 mmol/L (ref 135–145)

## 2022-03-28 LAB — TROPONIN I (HIGH SENSITIVITY)
Troponin I (High Sensitivity): 3 ng/L (ref <18)
Troponin I (High Sensitivity): 3 ng/L (ref <18)

## 2022-03-28 LAB — D-DIMER, QUANTITATIVE: D-Dimer, Quant: 0.39 ug/mL-FEU (ref 0.00–0.50)

## 2022-03-28 MED ORDER — LIDOCAINE 5 % EX PTCH
1.0000 | MEDICATED_PATCH | CUTANEOUS | Status: DC
  Administered 2022-03-28: 1 via TRANSDERMAL
  Filled 2022-03-28: qty 1

## 2022-03-28 MED ORDER — LIDOCAINE VISCOUS HCL 2 % MT SOLN
15.0000 mL | Freq: Once | OROMUCOSAL | Status: AC
Start: 2022-03-28 — End: 2022-03-28
  Administered 2022-03-28: 15 mL via ORAL
  Filled 2022-03-28: qty 15

## 2022-03-28 MED ORDER — ALUM & MAG HYDROXIDE-SIMETH 200-200-20 MG/5ML PO SUSP
30.0000 mL | Freq: Once | ORAL | Status: AC
  Administered 2022-03-28: 30 mL via ORAL
  Filled 2022-03-28: qty 30

## 2022-03-28 MED ORDER — LIDOCAINE 5 % EX PTCH
1.0000 | MEDICATED_PATCH | CUTANEOUS | 0 refills | Status: DC
  Filled 2022-03-28: qty 30, 30d supply, fill #0

## 2022-03-28 MED ORDER — ACETAMINOPHEN 500 MG PO TABS
1000.0000 mg | ORAL_TABLET | Freq: Once | ORAL | Status: AC
  Administered 2022-03-28: 1000 mg via ORAL
  Filled 2022-03-28: qty 2

## 2022-03-28 NOTE — ED Provider Notes (Signed)
[facility] EMERGENCY DEPT Provider Note   CSN: [id number] Arrival date & time: 03/28/22  1046     History  Chief Complaint  Patient presents with   Chest Pain    Kanai Yicel Shannon is a 66 y.o. female.   Chest Pain   66 year old female with medical history significant for hypertension, Graves' disease, coronary artery disease status post cardiac cath at [facility] in 2017 with nonobstructive CAD of 1 vessel, end-stage renal disease status post renal transplant who presents emergency department with chest pain.  The patient states that she developed chest pain last night around 0 130.  She drank some water and ate some potato chips and put a cool rag on her chest to try and improve the pain.  She states that she had a mild cough associated with the pain.  She endorses sharp discomfort substernally in her chest with no significant radiation.  She denies any rash on her chest wall.  She denies any falls or trauma.  She denies any nausea, vomiting, diaphoresis.  Home Medications Prior to Admission medications   Medication Sig Start Date End Date Taking? Authorizing Provider  lidocaine (LIDODERM) 5 % Place 1 patch onto the skin daily. Remove & Discard patch within 12 hours or as directed by MD 03/28/22  Yes Regan Lemming, MD  amLODipine (NORVASC) 2.5 MG tablet Take 2.5 mg by mouth 2 (two) times daily. 06/07/20   [provider]  Baclofen 5 MG TABS Take 5 mg by mouth 2 (two) times daily as needed. 02/13/22   Raspet, Derry Skill, PA-C  Cholecalciferol (VITAMIN D3) 125 MCG (5000 UT) CAPS Take by mouth.    [provider]  Cyanocobalamin (VITAMIN B12) 1000 MCG TBCR Take by mouth.    [provider]  Menaquinone-7 (VITAMIN K2 PO) Take by mouth.    [provider]  mycophenolate (CELLCEPT) 250 MG capsule Take 250 mg by mouth 2 (two) times daily. 9am and 9pm    [provider]  Prenatal Multivit-Min-Fe-FA (PRENATAL, W/IRON & FA,) 27-0.8 MG TABS  Take 1 tablet by mouth daily.    [provider]  Propylene Glycol 0.6 % SOLN Apply to eye.    [provider]  SYNTHROID 125 MCG tablet Take 1 tablet (125 mcg total) by mouth daily before breakfast. 03/24/21 03/24/22  Minette Brine, FNP  Tacrolimus ER (ENVARSUS XR) 1 MG TB24 Take by mouth. '2mg'$  tab daily    [provider]      Allergies    Amoxicillin-pot clavulanate, Other, Aspirin, Tetracyclines & related, and Statins    Review of Systems   Review of Systems  Cardiovascular:  Positive for chest pain.  All other systems reviewed and are negative.   Physical Exam Updated Vital Signs BP (!) 160/83   Pulse 76   Temp 98.1 F (36.7 C)   Resp 19   Ht 5' 3.5" (1.613 m)   Wt 77.1 kg   SpO2 100%   BMI 29.64 kg/m  Physical Exam Vitals and nursing note reviewed.  Constitutional:      General: She is not in acute distress.    Appearance: She is well-developed.  HENT:     Head: Normocephalic and atraumatic.  Eyes:     Conjunctiva/sclera: Conjunctivae normal.  Cardiovascular:     Rate and Rhythm: Normal rate and regular rhythm.     Heart sounds: No murmur heard. Pulmonary:     Effort: Pulmonary effort is normal. No respiratory distress.  Breath sounds: Normal breath sounds.  Chest:     Comments: Mid chest wall tenderness to palpation to the left of the sternum, no rash Abdominal:     Palpations: Abdomen is soft.     Tenderness: There is no abdominal tenderness.  Musculoskeletal:        General: No swelling.     Cervical back: Neck supple.  Skin:    General: Skin is warm and dry.     Capillary Refill: Capillary refill takes less than 2 seconds.  Neurological:     Mental Status: She is alert.  Psychiatric:        Mood and Affect: Mood normal.     ED Results / Procedures / Treatments   Labs (all labs ordered are listed, but only abnormal results are displayed) Labs Reviewed  BASIC METABOLIC PANEL - Abnormal; Notable for the following  components:      Result Value   Creatinine, Ser 1.33 (*)    Calcium 10.6 (*)    GFR, Estimated 44 (*)    All other components within normal limits  CBC  D-DIMER, QUANTITATIVE  TROPONIN I (HIGH SENSITIVITY)  TROPONIN I (HIGH SENSITIVITY)    EKG EKG Interpretation  Date/Time:  Tuesday March 28 2022 10:53:05 EDT Ventricular Rate:  84 PR Interval:  134 QRS Duration: 78 QT Interval:  352 QTC Calculation: 415 R Axis:   101 Text Interpretation: Normal sinus rhythm Rightward axis Cannot rule out Anterior infarct (cited on or before 15-Apr-2020) Abnormal ECG When compared with ECG of 15-Apr-2020 18:36, No significant change was found Confirmed by Regan Lemming ([id number]) on 03/28/2022 11:34:50 AM  Radiology DG Chest 2 View  Result Date: 03/28/2022 CLINICAL DATA:  Acute chest pain beginning last night. EXAM: CHEST - 2 VIEW COMPARISON:  04/15/2020 FINDINGS: The heart size and mediastinal contours are within normal limits. Stable moderate elevation of left hemidiaphragm. Stable bilateral lower lung scarring. No evidence of pulmonary infiltrate or edema. No evidence of pleural effusion. IMPRESSION: Stable bibasilar scarring and elevation of left hemidiaphragm. No active cardiopulmonary disease. Electronically Signed   By: Marlaine Hind M.D.   On: 03/28/2022 12:12    Procedures Procedures    Medications Ordered in ED Medications  lidocaine (LIDODERM) 5 % 1 patch (1 patch Transdermal Patch Applied 03/28/22 1332)  alum & mag hydroxide-simeth (MAALOX/MYLANTA) 200-200-20 MG/5ML suspension 30 mL (30 mLs Oral Given 03/28/22 1254)    And  lidocaine (XYLOCAINE) 2 % viscous mouth solution 15 mL (15 mLs Oral Given 03/28/22 1254)  acetaminophen (TYLENOL) tablet 1,000 mg (1,000 mg Oral Given 03/28/22 1329)    ED Course/ Medical Decision Making/ A&P                           Medical Decision Making Amount and/or Complexity of Data Reviewed Labs: ordered. Radiology: ordered.  Risk OTC  drugs. Prescription drug management.   66 year old female with medical history significant for hypertension, Graves' disease, coronary artery disease status post cardiac cath at [facility] in 2017 with nonobstructive CAD of 1 vessel, end-stage renal disease status post renal transplant who presents emergency department with chest pain.  The patient states that she developed chest pain last night around 0 130.  She drank some water and ate some potato chips and put a cool rag on her chest to try and improve the pain.  She states that she had a mild cough associated with the pain.  She endorses sharp discomfort substernally  in her chest with no significant radiation.  She denies any rash on her chest wall.  She denies any falls or trauma.  She denies any nausea, vomiting, diaphoresis.  Vitals and telemetry on arrival: On arrival, the patient was afebrile, hemodynamically stable, not tachycardic or tachypneic, saturating well on room air.  Sinus rhythm noted on cardiac telemetry.  Pertinent exam findings include:Lungs CTAB, left sided chest wall tenderness to palpation.   Differential diagnosis includes: Most likely musculoskeletal chest pain, costochondritis.  Considered early shingles.  Also considered ACS, pneumonia, pneumothorax, pulmonary embolism,pericarditis/myocarditis, GERD, PUD.  EKG: Normal sinus rhythm with a rate of 84 no abnormal intervals or dysrhythmia.  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 No concerning change from prior  Lab results include: D-dimer negative, troponins x2 negative, BMP unremarkable with creatinine at baseline, CBC without leukocytosis or anemia.  Well's score, low probability. D-dimer negative. Labs unremarkable.  Unlikely pneumonia, no cough, no leukocytosis, no fevers, CXR and exam without acute findings. Unlikely pneumothorax, no findings on  CXR. Unlikely pericarditis/myocarditis, does not fit clinical picture. Chest pain not exertional. Unlikely dissection, no  pulse deficit, no tearing chest pain, no neurologic complaints. Due to patient's high risk will rule out ACS.  Imaging results include: Chest x-ray without evidence of bacterial pneumonia. IMPRESSION:  Stable bibasilar scarring and elevation of left hemidiaphragm. No  active cardiopulmonary disease.    Course of tx has consisted of: Patient administered Tylenol, Maalox and lidocaine and placed a lidocaine patch  Patient has reproducible chest wall tenderness palpation, no rash present.  Symptoms are consistent with likely costochondritis or musculoskeletal pain.  Low concern for ACS with 2 negative troponins.  Pain is not exertional.  Reproducible with palpation.  No evidence for shingles although return precautions provided for development of shingles.  Patient's clinical presentation is most consistent with musculoskeletal chest pain, costochondritis.  Patient stable for discharge with plan for outpatient follow-up as needed.  Advised Tylenol and lidocaine patches for pain control given the patient's history of renal transplant.  Final Clinical Impression(s) / ED Diagnoses Final diagnoses:  Costochondritis    Rx / DC Orders ED Discharge Orders          Ordered    lidocaine (LIDODERM) 5 %  Every 24 hours        03/28/22 1330              Regan Lemming, MD 03/28/22 1419

## 2022-03-28 NOTE — Discharge Instructions (Addendum)
Your cardiac enzymes and EKG were normal.  Your chest x-ray revealed no evidence of a ruptured lung or pneumonia.  The remainder of your laboratory work-up was also reassuring to include a lab test that checks for blood clots which was negative.  Given your history of kidney transplant, do not recommend NSAIDs for pain control.  Recommend Tylenol and lidocaine patch for pain control.  If you develop a vesicular rash on your chest, this could be consistent with shingles and would warrant treatment with an antiviral.  Recommend you follow-up in the emergency department or with your PCP if this develops.

## 2022-03-28 NOTE — ED Triage Notes (Signed)
Patient reports to the ER for chest pain. Patient reports it started last night at 130. States her BP 110/68 and her pulse was 98. States she drank some water and ate some potato chips and put a cool rag on her chest to try and go back to sleep. Patient reports pain when she would cough. Patient reports she has an apt with Dr. Alvester Chou but he told her to report to the ER.   Denies N/V. Reports the chest pain is in a central position with no migration into neck, jaw, back or shoulder.

## 2022-03-28 NOTE — ED Notes (Signed)
Reports CP left center. On going since 0130 this morning. Pain increases while coughing. Denies N/V, or pain to the neck/jaw or shoulder. Doesn't migrate anywhere. Rates it a 6/10. No recent illness or around anyone that's sick.

## 2022-03-28 NOTE — ED Notes (Signed)
Discharge paperwork given and understood. 

## 2022-04-05 DIAGNOSIS — M940 Chondrocostal junction syndrome [Tietze]: Secondary | ICD-10-CM | POA: Diagnosis not present

## 2022-04-05 DIAGNOSIS — J40 Bronchitis, not specified as acute or chronic: Secondary | ICD-10-CM | POA: Diagnosis not present

## 2022-04-05 DIAGNOSIS — D849 Immunodeficiency, unspecified: Secondary | ICD-10-CM | POA: Diagnosis not present

## 2022-04-12 ENCOUNTER — Telehealth: Payer: Self-pay | Admitting: Physician Assistant

## 2022-04-12 ENCOUNTER — Ambulatory Visit: Payer: MEDICARE | Admitting: Physician Assistant

## 2022-04-12 NOTE — Telephone Encounter (Signed)
Good Morning Yvette Jenkins,  Patient called stating that she would not be able to come in for her appointment with you today at 2:30 due to getting sick over night.  Patient stated she will call back at a later time to reschedule.

## 2022-04-18 ENCOUNTER — Encounter: Payer: Self-pay | Admitting: Cardiovascular Disease

## 2022-04-18 ENCOUNTER — Ambulatory Visit (INDEPENDENT_AMBULATORY_CARE_PROVIDER_SITE_OTHER): Payer: MEDICARE | Admitting: Cardiovascular Disease

## 2022-04-18 DIAGNOSIS — I739 Peripheral vascular disease, unspecified: Secondary | ICD-10-CM

## 2022-04-18 DIAGNOSIS — E782 Mixed hyperlipidemia: Secondary | ICD-10-CM

## 2022-04-18 DIAGNOSIS — E785 Hyperlipidemia, unspecified: Secondary | ICD-10-CM | POA: Insufficient documentation

## 2022-04-18 NOTE — Patient Instructions (Signed)
Medication Instructions:  Your physician recommends that you continue on your current medications as directed. Please refer to the Current Medication list given to you today.  *If you need a refill on your cardiac medications before your next appointment, please call your pharmacy*   Testing/Procedures: Your physician has requested that you have a lower extremity arterial duplex. This test is an ultrasound of the arteries in the legs. It looks at arterial blood flow in the legs. Allow one hour for Lower Arterial scans. There are no restrictions or special instructions This procedure will be done at [place]  Your physician has requested that you have an ankle brachial index (ABI). During this test an ultrasound and blood pressure cuff are used to evaluate the arteries that supply the arms and legs with blood. Allow thirty minutes for this exam. There are no restrictions or special instructions. This procedure will be done at [place]. [address]   Dr. Gwenlyn Found has ordered a CT coronary calcium score.   Test locations:   [employer] [facility], 2nd Floor)  This is $99 out of pocket.   Coronary CalciumScan A coronary calcium scan is an imaging test used to look for deposits of calcium and other fatty materials (plaques) in the inner lining of the blood vessels of the heart (coronary arteries). These deposits of calcium and plaques can partly clog and narrow the coronary arteries without producing any symptoms or warning signs. This puts a person at risk for a heart attack. This test can detect these deposits before symptoms develop. Tell a health care provider about: Any allergies you have. All medicines you are taking, including vitamins, herbs, eye drops, creams, and over-the-counter medicines. Any problems you or family members have had with anesthetic medicines. Any blood disorders you have. Any surgeries you have had. Any medical  conditions you have. Whether you are pregnant or may be pregnant. What are the risks? Generally, this is a safe procedure. However, problems may occur, including: Harm to a pregnant woman and her unborn baby. This test involves the use of radiation. Radiation exposure can be dangerous to a pregnant woman and her unborn baby. If you are pregnant, you generally should not have this procedure done. Slight increase in the risk of cancer. This is because of the radiation involved in the test. What happens before the procedure? No preparation is needed for this procedure. What happens during the procedure? You will undress and remove any jewelry around your neck or chest. You will put on a hospital gown. Sticky electrodes will be placed on your chest. The electrodes will be connected to an electrocardiogram (ECG) machine to record a tracing of the electrical activity of your heart. A CT scanner will take pictures of your heart. During this time, you will be asked to lie still and hold your breath for 2-3 seconds while a picture of your heart is being taken. The procedure may vary among health care providers and hospitals. What happens after the procedure? You can get dressed. You can return to your normal activities. It is up to you to get the results of your test. Ask your health care provider, or the department that is doing the test, when your results will be ready. Summary A coronary calcium scan is an imaging test used to look for deposits of calcium and other fatty materials (plaques) in the inner lining of the blood vessels of the heart (coronary arteries). Generally, this is a safe  procedure. Tell your health care provider if you are pregnant or may be pregnant. No preparation is needed for this procedure. A CT scanner will take pictures of your heart. You can return to your normal activities after the scan is done. This information is not intended to replace advice given to you by your  health care provider. Make sure you discuss any questions you have with your health care provider. Document Released: 02/17/2008 Document Revised: 07/10/2016 Document Reviewed: 07/10/2016 Elsevier Interactive Patient Education  2017 [place]: At [facility], you and your health needs are our priority.  As part of our continuing mission to provide you with exceptional heart care, we have created designated Provider Care Teams.  These Care Teams include your primary Cardiologist (physician) and Advanced Practice Providers (APPs -  Physician Assistants and Nurse Practitioners) who all work together to provide you with the care you need, when you need it.  We recommend signing up for the patient portal called "MyChart".  Sign up information is provided on this After Visit Summary.  MyChart is used to connect with patients for Virtual Visits (Telemedicine).  Patients are able to view lab/test results, encounter notes, upcoming appointments, etc.  Non-urgent messages can be sent to your provider as well.   To learn more about what you can do with MyChart, go to [email or web address].    Your next appointment:   We will see you on an as needed basis.  Provider:   Quay Burow, MD

## 2022-04-18 NOTE — Progress Notes (Signed)
04/18/2022 Makenzee Choudhry   [birth year]  [id number]  Primary Physician Harlan Stains, MD Primary Cardiologist: Lorretta Harp MD Lupe Carney, [place]  HPI:  Yvette Jenkins is a 66 y.o. mildly overweight married African-American female mother of 21, grandmother of 7 grandchildren who owns a childcare business.  She was referred by Dr. Harlan Stains for peripheral vascular evaluation.  Her cardiac risk factors are remarkable for hypertension and untreated hyperlipidemia.  Apparently she is statin intolerant.  There is no family history for heart disease.  She is never had heart attack or stroke.  She denies chest pain or shortness of breath.  She was on dialysis for 3 and half years and had a renal transplant at [facility] 9/19 with stable serum creatinines.  She does complain of claudication type symptoms since she developed COVID in 2020.   Current Meds  Medication Sig   amLODipine (NORVASC) 2.5 MG tablet Take 2.5 mg by mouth 2 (two) times daily.   Baclofen 5 MG TABS Take 5 mg by mouth 2 (two) times daily as needed.   cefUROXime (CEFTIN) 500 MG tablet 1 tablet   Cholecalciferol (VITAMIN D3) 125 MCG (5000 UT) CAPS Take by mouth.   Cyanocobalamin (VITAMIN B12) 1000 MCG TBCR Take by mouth.   doxycycline (VIBRA-TABS) 100 MG tablet 1 tablet   lidocaine (LIDODERM) 5 % Place 1 patch onto the skin daily. Remove & Discard patch within 12 hours or as directed by MD   Menaquinone-7 (VITAMIN K2 PO) Take by mouth.   mycophenolate (CELLCEPT) 250 MG capsule Take 250 mg by mouth 2 (two) times daily. 9am and 9pm   mycophenolate (CELLCEPT) 250 MG capsule Take by mouth.   ondansetron (ZOFRAN-ODT) 4 MG disintegrating tablet 1 tablet on the tongue and allow to dissolve   predniSONE (DELTASONE) 1 MG tablet prednisone   Prenatal Multivit-Min-Fe-FA (PRENATAL, W/IRON & FA,) 27-0.8 MG TABS Take 1 tablet by mouth daily.   Propylene Glycol (SYSTANE BALANCE) 0.6 % SOLN 1-2 drops   Propylene Glycol 0.6  % SOLN Apply to eye.   tacrolimus ER (ENVARSUS XR) 0.75 MG TB24 tablet Envarsus XR   Tacrolimus ER (ENVARSUS XR) 1 MG TB24 Take by mouth. '2mg'$  tab daily     Allergies  Allergen Reactions   Amoxicillin-Pot Clavulanate Nausea Only    Other reaction(s): Vomiting   Other     Other reaction(s): Other (See Comments) S/P Renal Transplant Other reaction(s): Unknown Other reaction(s): Unknown   Aspirin Hives    Other reaction(s): Unknown   Tetracyclines & Related Hives, Other (See Comments) and Nausea Only    GI intolerance -"Messes with stomach" Other reaction(s): Unknown Messes with stomach    Statins     Other reaction(s): Unknown    Social History   Socioeconomic History   Marital status: Married    Spouse name: Not on file   Number of children: Not on file   Years of education: Not on file   Highest education level: Not on file  Occupational History   Occupation: Child care  Tobacco Use   Smoking status: Never    Passive exposure: Never   Smokeless tobacco: Never  Vaping Use   Vaping Use: Never used  Substance and Sexual Activity   Alcohol use: No   Drug use: No   Sexual activity: Not Currently  Other Topics Concern   Not on file  Social History Narrative   Not on file   Social Determinants of Health  Financial Resource Strain: Low Risk  (05/26/2021)   Overall Financial Resource Strain (CARDIA)    Difficulty of Paying Living Expenses: Not hard at all  Food Insecurity: No Food Insecurity (05/26/2021)   Hunger Vital Sign    Worried About Running Out of Food in the Last Year: Never true    Ran Out of Food in the Last Year: Never true  Transportation Needs: No Transportation Needs (05/26/2021)   PRAPARE - Hydrologist (Medical): No    Lack of Transportation (Non-Medical): No  Physical Activity: Inactive (05/26/2021)   Exercise Vital Sign    Days of Exercise per Week: 0 days    Minutes of Exercise per Session: 0 min  Stress: No Stress  Concern Present (05/26/2021)   [place]    Feeling of Stress : Not at all  Social Connections: Not on file  Intimate Partner Violence: Not At Risk (06/19/2018)   Humiliation, Afraid, Rape, and Kick questionnaire    Fear of Current or Ex-Partner: No    Emotionally Abused: No    Physically Abused: No    Sexually Abused: No     Review of Systems: General: negative for chills, fever, night sweats or weight changes.  Cardiovascular: negative for chest pain, dyspnea on exertion, edema, orthopnea, palpitations, paroxysmal nocturnal dyspnea or shortness of breath Dermatological: negative for rash Respiratory: negative for cough or wheezing Urologic: negative for hematuria Abdominal: negative for nausea, vomiting, diarrhea, bright red blood per rectum, melena, or hematemesis Neurologic: negative for visual changes, syncope, or dizziness All other systems reviewed and are otherwise negative except as noted above.    Blood pressure 110/66, pulse 88, height '5\' 4"'$  (1.626 m), weight 167 lb 9.6 oz (76 kg), SpO2 98 %.  General appearance: alert and no distress Neck: no adenopathy, no carotid bruit, no JVD, supple, symmetrical, trachea midline, and thyroid not enlarged, symmetric, no tenderness/mass/nodules Lungs: clear to auscultation bilaterally Heart: regular rate and rhythm, S1, S2 normal, no murmur, click, rub or gallop Extremities: extremities normal, atraumatic, no cyanosis or edema Pulses: Diminished pedal pulses Skin: Skin color, texture, turgor normal. No rashes or lesions Neurologic: Grossly normal  EKG sinus rhythm at 88 without ST or T wave changes.  I personally reviewed this EKG.  ASSESSMENT AND PLAN:   Hyperlipidemia History of hyperlipidemia not on statin therapy because of statin intolerance with lipid profile performed 12/19/2021 revealing total cholesterol 258, LDL of 186 and HDL 42.  I am going to get a  coronary calcium score to help guide how aggressive to be with risk factor modification I will refer her to our lipid clinic to discuss alternative therapies such as PCSK9, or bempedoic acid.  Claudication in peripheral vascular disease ([place]) Ms. Dossantos was referred to me by Dr. Harlan Stains because of symptoms of claudication.  She has had these since she developed COVID back in 2020.  I cannot feel good pedal pulses on exam.  I am going to get lower extremity arterial Doppler studies to further evaluate.     Lorretta Harp MD FACP,FACC,FAHA, [facility] 04/18/2022 2:29 PM

## 2022-04-18 NOTE — Assessment & Plan Note (Signed)
History of hyperlipidemia not on statin therapy because of statin intolerance with lipid profile performed 12/19/2021 revealing total cholesterol 258, LDL of 186 and HDL 42.  I am going to get a coronary calcium score to help guide how aggressive to be with risk factor modification I will refer her to our lipid clinic to discuss alternative therapies such as PCSK9, or bempedoic acid.

## 2022-04-18 NOTE — Assessment & Plan Note (Signed)
Yvette Jenkins was referred to me by Dr. Harlan Stains because of symptoms of claudication.  She has had these since she developed COVID back in 2020.  I cannot feel good pedal pulses on exam.  I am going to get lower extremity arterial Doppler studies to further evaluate.

## 2022-04-19 DIAGNOSIS — Z940 Kidney transplant status: Secondary | ICD-10-CM | POA: Diagnosis not present

## 2022-04-19 DIAGNOSIS — D849 Immunodeficiency, unspecified: Secondary | ICD-10-CM | POA: Diagnosis not present

## 2022-04-19 DIAGNOSIS — E7800 Pure hypercholesterolemia, unspecified: Secondary | ICD-10-CM | POA: Diagnosis not present

## 2022-04-19 DIAGNOSIS — N2581 Secondary hyperparathyroidism of renal origin: Secondary | ICD-10-CM | POA: Diagnosis not present

## 2022-04-25 DIAGNOSIS — Z5181 Encounter for therapeutic drug level monitoring: Secondary | ICD-10-CM | POA: Diagnosis not present

## 2022-04-25 DIAGNOSIS — Z940 Kidney transplant status: Secondary | ICD-10-CM | POA: Diagnosis not present

## 2022-05-09 ENCOUNTER — Encounter: Payer: Self-pay | Admitting: Pharmacist

## 2022-05-09 ENCOUNTER — Telehealth: Payer: Self-pay | Admitting: Pharmacist

## 2022-05-09 ENCOUNTER — Ambulatory Visit (INDEPENDENT_AMBULATORY_CARE_PROVIDER_SITE_OTHER): Payer: MEDICARE | Admitting: Pharmacist

## 2022-05-09 ENCOUNTER — Ambulatory Visit
Admission: RE | Admit: 2022-05-09 | Discharge: 2022-05-09 | Disposition: A | Discharge status: Home / Self Care | Payer: MEDICARE | Source: Ambulatory Visit | Attending: Cardiology | Admitting: Cardiology

## 2022-05-09 DIAGNOSIS — I700 Atherosclerosis of aorta: Secondary | ICD-10-CM | POA: Insufficient documentation

## 2022-05-09 DIAGNOSIS — E782 Mixed hyperlipidemia: Secondary | ICD-10-CM | POA: Insufficient documentation

## 2022-05-09 DIAGNOSIS — N951 Menopausal and female climacteric states: Secondary | ICD-10-CM | POA: Insufficient documentation

## 2022-05-09 DIAGNOSIS — I739 Peripheral vascular disease, unspecified: Secondary | ICD-10-CM | POA: Diagnosis not present

## 2022-05-09 DIAGNOSIS — E663 Overweight: Secondary | ICD-10-CM | POA: Insufficient documentation

## 2022-05-09 NOTE — Patient Instructions (Signed)
It was nice meeting you today  We would like your LDL (bad cholesterol) to be less than 70  We would like to start you on a new medication called Repatha or Praluent. Both are administered once every 2 weeks  I will complete the prior authorization for you and contact you when it is approved  Once you start the medication we will recheck your cholesterol in 2-3 months  Please call with any questions  Karren Cobble, PharmD, [place], [place], [place], [place], [place], [address] Phone: [phone], Fax: [phone]

## 2022-05-09 NOTE — Progress Notes (Unsigned)
Patient ID: [name]                 DOB: 1956-03-24                    MRN: [id number]     HPI: Yvette Jenkins is a 66 y.o. female patient referred to lipid clinic by Dr Gwenlyn Found. PMH is significant for history of renal transplant, PVD, and CAD.  Patient is statin intolerant.  Patient seen after LE arterial study.  Scheduled for cardiac CT in 3 days.    Currently takes mycophenolate, prednisone, and tacrolimus post kidney transplant. Currently being treated for upper respiratory infection.  Has tried atorvastatin and rosuvastatin and is intolerant to both.  Current Medications: N/A  Intolerances:  Atorvastatin Rosuvastatin  Risk Factors:  CAD PVD  LDL goal: <70  Labs: TC 225, LDL 157, HDL 38, Trigs 149 (04/19/22)  Past Medical History:  Diagnosis Date   Acquired renal cyst of right kidney    Anemia associated with chronic renal failure    IV Iron infusion    Arthritis    Coronary artery disease    per pt cardiac cath at [facility] 10-26-2015  non-obstructive cad of one vessel   End-stage renal disease on peritoneal dialysis [facility])    nephrologist-  dr Justin Mend [facility])  daily dialysis start 7-8pm to 7-8am-- currently on kidney transplant list at [facility]   Gross hematuria    History of Graves' disease    Hypertension    Hypothyroidism, postradioiodine therapy    Kidney transplant candidate    on waiting list at [facility]   Left nephrolithiasis    non-obstructive per pt    PONV (postoperative nausea and vomiting)    Secondary hyperparathyroidism of renal origin [facility])     Current Outpatient Medications on File Prior to Visit  Medication Sig Dispense Refill   amLODipine (NORVASC) 2.5 MG tablet Take 2.5 mg by mouth 2 (two) times daily.     Baclofen 5 MG TABS Take 5 mg by mouth 2 (two) times daily as needed. 10 tablet 0   cefUROXime (CEFTIN) 500 MG tablet 1 tablet     Cholecalciferol (VITAMIN D3) 125 MCG (5000 UT) CAPS Take by mouth.     Cyanocobalamin  (VITAMIN B12) 1000 MCG TBCR Take by mouth.     doxycycline (VIBRA-TABS) 100 MG tablet 1 tablet     lidocaine (LIDODERM) 5 % Place 1 patch onto the skin daily. Remove & Discard patch within 12 hours or as directed by MD 30 patch 0   Menaquinone-7 (VITAMIN K2 PO) Take by mouth.     mycophenolate (CELLCEPT) 250 MG capsule Take 250 mg by mouth 2 (two) times daily. 9am and 9pm     mycophenolate (CELLCEPT) 250 MG capsule Take by mouth.     ondansetron (ZOFRAN-ODT) 4 MG disintegrating tablet 1 tablet on the tongue and allow to dissolve     predniSONE (DELTASONE) 1 MG tablet prednisone     Prenatal Multivit-Min-Fe-FA (PRENATAL, W/IRON & FA,) 27-0.8 MG TABS Take 1 tablet by mouth daily.     Propylene Glycol (SYSTANE BALANCE) 0.6 % SOLN 1-2 drops     Propylene Glycol 0.6 % SOLN Apply to eye.     SYNTHROID 125 MCG tablet Take 1 tablet (125 mcg total) by mouth daily before breakfast. 90 tablet 0   tacrolimus ER (ENVARSUS XR) 0.75 MG TB24 tablet Envarsus XR     Tacrolimus ER (ENVARSUS XR) 1 MG  TB24 Take by mouth. '2mg'$  tab daily     No current facility-administered medications on file prior to visit.    Allergies  Allergen Reactions   Amoxicillin-Pot Clavulanate Nausea Only    Other reaction(s): Vomiting   Other     Other reaction(s): Other (See Comments) S/P Renal Transplant Other reaction(s): Unknown Other reaction(s): Unknown   Aspirin Hives    Other reaction(s): Unknown   Tetracyclines & Related Hives, Other (See Comments) and Nausea Only    GI intolerance -"Messes with stomach" Other reaction(s): Unknown Messes with stomach    Statins     Other reaction(s): Unknown    Assessment/Plan:  1. Hyperlipidemia - Patient LDL 157 which is above goal of <70. Coronary CT pending. Since patient is statin intolerant, will likely need PCSK9i to reach goal. Patient very hesitant to give injections.  Using demo pen, educated patient on mechanism of action, storage, site selection, administration, and  possible adverse effects. Advised to not start medication until she is over her URTI.  Will complete PA and contact patient when approved. Recheck lipid panel in 2-3 months.  Karren Cobble, PharmD, BCACP, [place], [place], [place], [place], [address] Phone: [phone], Fax: [phone]

## 2022-05-09 NOTE — Telephone Encounter (Signed)
PA for Praluent subimtted.  Key: [id number]

## 2022-05-10 MED ORDER — PRALUENT 75 MG/ML ~~LOC~~ SOAJ: 1.0000 mL | SUBCUTANEOUS | 3 refills | Status: DC

## 2022-05-10 NOTE — Telephone Encounter (Signed)
PA for Praluent approved.  Attempted to call patient, no answer.  Mailbox full.  Rx sent to pharmacy

## 2022-05-11 ENCOUNTER — Other Ambulatory Visit (HOSPITAL_COMMUNITY): Payer: Self-pay

## 2022-05-12 ENCOUNTER — Other Ambulatory Visit (HOSPITAL_BASED_OUTPATIENT_CLINIC_OR_DEPARTMENT_OTHER): Payer: MEDICARE

## 2022-05-15 ENCOUNTER — Telehealth: Payer: Self-pay | Admitting: Cardiovascular Disease

## 2022-05-15 NOTE — Telephone Encounter (Signed)
Attempted to call patient, left message for patient to call back to office.   

## 2022-05-15 NOTE — Telephone Encounter (Signed)
Patient calling for someone to go over and explain her vascular test results.

## 2022-05-16 NOTE — Telephone Encounter (Signed)
Patient has been called and educated on the results of her lower leg dopplers.

## 2022-05-31 DIAGNOSIS — Z940 Kidney transplant status: Secondary | ICD-10-CM | POA: Diagnosis not present

## 2022-06-08 DIAGNOSIS — E785 Hyperlipidemia, unspecified: Secondary | ICD-10-CM | POA: Diagnosis not present

## 2022-06-08 DIAGNOSIS — I739 Peripheral vascular disease, unspecified: Secondary | ICD-10-CM | POA: Diagnosis not present

## 2022-06-08 DIAGNOSIS — I10 Essential (primary) hypertension: Secondary | ICD-10-CM | POA: Diagnosis not present

## 2022-06-08 DIAGNOSIS — I2510 Atherosclerotic heart disease of native coronary artery without angina pectoris: Secondary | ICD-10-CM | POA: Diagnosis not present

## 2022-06-08 DIAGNOSIS — I700 Atherosclerosis of aorta: Secondary | ICD-10-CM | POA: Diagnosis not present

## 2022-06-08 DIAGNOSIS — J40 Bronchitis, not specified as acute or chronic: Secondary | ICD-10-CM | POA: Diagnosis not present

## 2022-06-08 DIAGNOSIS — E039 Hypothyroidism, unspecified: Secondary | ICD-10-CM | POA: Diagnosis not present

## 2022-06-08 DIAGNOSIS — D849 Immunodeficiency, unspecified: Secondary | ICD-10-CM | POA: Diagnosis not present

## 2022-06-08 DIAGNOSIS — Z940 Kidney transplant status: Secondary | ICD-10-CM | POA: Diagnosis not present

## 2022-06-29 ENCOUNTER — Ambulatory Visit: Payer: MEDICARE

## 2022-06-29 ENCOUNTER — Ambulatory Visit: Payer: MEDICARE | Admitting: Internal Medicine

## 2022-07-17 DIAGNOSIS — Z01419 Encounter for gynecological examination (general) (routine) without abnormal findings: Secondary | ICD-10-CM | POA: Diagnosis not present

## 2022-07-17 DIAGNOSIS — Z1231 Encounter for screening mammogram for malignant neoplasm of breast: Secondary | ICD-10-CM | POA: Diagnosis not present

## 2022-07-17 DIAGNOSIS — Z6829 Body mass index (BMI) 29.0-29.9, adult: Secondary | ICD-10-CM | POA: Diagnosis not present

## 2022-07-19 ENCOUNTER — Ambulatory Visit (HOSPITAL_BASED_OUTPATIENT_CLINIC_OR_DEPARTMENT_OTHER)
Admission: RE | Admit: 2022-07-19 | Discharge: 2022-07-19 | Disposition: A | Discharge status: Home / Self Care | Payer: MEDICARE | Source: Ambulatory Visit | Attending: Cardiovascular Disease | Admitting: Cardiovascular Disease

## 2022-07-19 DIAGNOSIS — E782 Mixed hyperlipidemia: Secondary | ICD-10-CM | POA: Insufficient documentation

## 2022-09-22 DIAGNOSIS — G8929 Other chronic pain: Secondary | ICD-10-CM | POA: Diagnosis not present

## 2022-09-22 DIAGNOSIS — M25511 Pain in right shoulder: Secondary | ICD-10-CM | POA: Diagnosis not present

## 2022-09-22 DIAGNOSIS — M545 Low back pain, unspecified: Secondary | ICD-10-CM | POA: Diagnosis not present

## 2022-10-13 DIAGNOSIS — E039 Hypothyroidism, unspecified: Secondary | ICD-10-CM | POA: Diagnosis not present

## 2022-10-13 DIAGNOSIS — M5442 Lumbago with sciatica, left side: Secondary | ICD-10-CM | POA: Diagnosis not present

## 2022-10-13 DIAGNOSIS — D849 Immunodeficiency, unspecified: Secondary | ICD-10-CM | POA: Diagnosis not present

## 2022-10-13 DIAGNOSIS — R052 Subacute cough: Secondary | ICD-10-CM | POA: Diagnosis not present

## 2022-10-13 DIAGNOSIS — E785 Hyperlipidemia, unspecified: Secondary | ICD-10-CM | POA: Diagnosis not present

## 2022-10-13 DIAGNOSIS — Z940 Kidney transplant status: Secondary | ICD-10-CM | POA: Diagnosis not present

## 2022-10-13 DIAGNOSIS — I10 Essential (primary) hypertension: Secondary | ICD-10-CM | POA: Diagnosis not present

## 2022-10-20 DIAGNOSIS — D849 Immunodeficiency, unspecified: Secondary | ICD-10-CM | POA: Diagnosis not present

## 2022-10-20 DIAGNOSIS — Z940 Kidney transplant status: Secondary | ICD-10-CM | POA: Diagnosis not present

## 2022-10-20 DIAGNOSIS — I10 Essential (primary) hypertension: Secondary | ICD-10-CM | POA: Diagnosis not present

## 2022-10-20 DIAGNOSIS — J984 Other disorders of lung: Secondary | ICD-10-CM | POA: Diagnosis not present

## 2022-10-20 DIAGNOSIS — A430 Pulmonary nocardiosis: Secondary | ICD-10-CM | POA: Diagnosis not present

## 2022-10-20 DIAGNOSIS — Z4822 Encounter for aftercare following kidney transplant: Secondary | ICD-10-CM | POA: Diagnosis not present

## 2022-10-20 DIAGNOSIS — D84821 Immunodeficiency due to drugs: Secondary | ICD-10-CM | POA: Diagnosis not present

## 2022-11-01 DIAGNOSIS — M5442 Lumbago with sciatica, left side: Secondary | ICD-10-CM | POA: Diagnosis not present

## 2022-11-01 DIAGNOSIS — M5451 Vertebrogenic low back pain: Secondary | ICD-10-CM | POA: Diagnosis not present

## 2022-11-07 DIAGNOSIS — M5442 Lumbago with sciatica, left side: Secondary | ICD-10-CM | POA: Diagnosis not present

## 2022-11-10 ENCOUNTER — Inpatient Hospital Stay (HOSPITAL_BASED_OUTPATIENT_CLINIC_OR_DEPARTMENT_OTHER)
Admission: EM | Admit: 2022-11-10 | Discharge: 2022-11-13 | DRG: 303 | Disposition: A | Discharge status: Home / Self Care | Payer: MEDICARE | Attending: Internal Medicine | Admitting: Internal Medicine

## 2022-11-10 ENCOUNTER — Emergency Department (HOSPITAL_BASED_OUTPATIENT_CLINIC_OR_DEPARTMENT_OTHER): Payer: MEDICARE | Admitting: Radiology

## 2022-11-10 ENCOUNTER — Other Ambulatory Visit: Payer: Self-pay

## 2022-11-10 ENCOUNTER — Encounter (HOSPITAL_BASED_OUTPATIENT_CLINIC_OR_DEPARTMENT_OTHER): Payer: Self-pay | Admitting: Emergency Medicine

## 2022-11-10 DIAGNOSIS — N1832 Chronic kidney disease, stage 3b: Secondary | ICD-10-CM | POA: Diagnosis present

## 2022-11-10 DIAGNOSIS — Z79890 Hormone replacement therapy (postmenopausal): Secondary | ICD-10-CM | POA: Diagnosis not present

## 2022-11-10 DIAGNOSIS — Z8249 Family history of ischemic heart disease and other diseases of the circulatory system: Secondary | ICD-10-CM | POA: Diagnosis not present

## 2022-11-10 DIAGNOSIS — Z87442 Personal history of urinary calculi: Secondary | ICD-10-CM | POA: Diagnosis not present

## 2022-11-10 DIAGNOSIS — Z841 Family history of disorders of kidney and ureter: Secondary | ICD-10-CM

## 2022-11-10 DIAGNOSIS — I25118 Atherosclerotic heart disease of native coronary artery with other forms of angina pectoris: Secondary | ICD-10-CM | POA: Diagnosis not present

## 2022-11-10 DIAGNOSIS — Z79899 Other long term (current) drug therapy: Secondary | ICD-10-CM

## 2022-11-10 DIAGNOSIS — E785 Hyperlipidemia, unspecified: Secondary | ICD-10-CM | POA: Diagnosis present

## 2022-11-10 DIAGNOSIS — Z7960 Long term (current) use of unspecified immunomodulators and immunosuppressants: Secondary | ICD-10-CM

## 2022-11-10 DIAGNOSIS — I2089 Other forms of angina pectoris: Secondary | ICD-10-CM | POA: Diagnosis present

## 2022-11-10 DIAGNOSIS — I120 Hypertensive chronic kidney disease with stage 5 chronic kidney disease or end stage renal disease: Secondary | ICD-10-CM | POA: Diagnosis present

## 2022-11-10 DIAGNOSIS — Y842 Radiological procedure and radiotherapy as the cause of abnormal reaction of the patient, or of later complication, without mention of misadventure at the time of the procedure: Secondary | ICD-10-CM | POA: Diagnosis present

## 2022-11-10 DIAGNOSIS — M1990 Unspecified osteoarthritis, unspecified site: Secondary | ICD-10-CM | POA: Diagnosis present

## 2022-11-10 DIAGNOSIS — I200 Unstable angina: Principal | ICD-10-CM

## 2022-11-10 DIAGNOSIS — Z881 Allergy status to other antibiotic agents status: Secondary | ICD-10-CM | POA: Diagnosis not present

## 2022-11-10 DIAGNOSIS — E039 Hypothyroidism, unspecified: Secondary | ICD-10-CM | POA: Diagnosis present

## 2022-11-10 DIAGNOSIS — E663 Overweight: Secondary | ICD-10-CM | POA: Diagnosis present

## 2022-11-10 DIAGNOSIS — Z79624 Long term (current) use of inhibitors of nucleotide synthesis: Secondary | ICD-10-CM

## 2022-11-10 DIAGNOSIS — I7092 Chronic total occlusion of artery of the extremities: Secondary | ICD-10-CM | POA: Diagnosis not present

## 2022-11-10 DIAGNOSIS — N183 Chronic kidney disease, stage 3 unspecified: Secondary | ICD-10-CM | POA: Diagnosis present

## 2022-11-10 DIAGNOSIS — E890 Postprocedural hypothyroidism: Secondary | ICD-10-CM | POA: Diagnosis present

## 2022-11-10 DIAGNOSIS — Z6828 Body mass index (BMI) 28.0-28.9, adult: Secondary | ICD-10-CM | POA: Diagnosis not present

## 2022-11-10 DIAGNOSIS — Z886 Allergy status to analgesic agent status: Secondary | ICD-10-CM | POA: Diagnosis not present

## 2022-11-10 DIAGNOSIS — R079 Chest pain, unspecified: Secondary | ICD-10-CM | POA: Diagnosis not present

## 2022-11-10 DIAGNOSIS — R0789 Other chest pain: Secondary | ICD-10-CM | POA: Diagnosis not present

## 2022-11-10 DIAGNOSIS — Z7952 Long term (current) use of systemic steroids: Secondary | ICD-10-CM

## 2022-11-10 DIAGNOSIS — Z940 Kidney transplant status: Secondary | ICD-10-CM

## 2022-11-10 DIAGNOSIS — Z880 Allergy status to penicillin: Secondary | ICD-10-CM | POA: Diagnosis not present

## 2022-11-10 DIAGNOSIS — Z888 Allergy status to other drugs, medicaments and biological substances status: Secondary | ICD-10-CM | POA: Diagnosis not present

## 2022-11-10 DIAGNOSIS — I700 Atherosclerosis of aorta: Secondary | ICD-10-CM | POA: Diagnosis present

## 2022-11-10 DIAGNOSIS — N2581 Secondary hyperparathyroidism of renal origin: Secondary | ICD-10-CM | POA: Diagnosis not present

## 2022-11-10 DIAGNOSIS — I70203 Unspecified atherosclerosis of native arteries of extremities, bilateral legs: Secondary | ICD-10-CM | POA: Diagnosis present

## 2022-11-10 LAB — BASIC METABOLIC PANEL
Anion gap: 10 (ref 5–15)
BUN: 23 mg/dL (ref 8–23)
CO2: 22 mmol/L (ref 22–32)
Calcium: 10.4 mg/dL — ABNORMAL HIGH (ref 8.9–10.3)
Chloride: 108 mmol/L (ref 98–111)
Creatinine, Ser: 1.4 mg/dL — ABNORMAL HIGH (ref 0.44–1.00)
GFR, Estimated: 41 mL/min — ABNORMAL LOW (ref >60)
Glucose, Bld: 83 mg/dL (ref 70–99)
Potassium: 4.1 mmol/L (ref 3.5–5.1)
Sodium: 140 mmol/L (ref 135–145)

## 2022-11-10 LAB — CBC
HCT: 37.3 % (ref 36.0–46.0)
Hemoglobin: 12.1 g/dL (ref 12.0–15.0)
MCH: 27.3 pg (ref 26.0–34.0)
MCHC: 32.4 g/dL (ref 30.0–36.0)
MCV: 84.2 fL (ref 80.0–100.0)
Platelets: 330 10*3/uL (ref 150–400)
RBC: 4.43 MIL/uL (ref 3.87–5.11)
RDW: 14.7 % (ref 11.5–15.5)
WBC: 10.2 10*3/uL (ref 4.0–10.5)
nRBC: 0 % (ref 0.0–0.2)

## 2022-11-10 LAB — TROPONIN I (HIGH SENSITIVITY)
Troponin I (High Sensitivity): 4 ng/L (ref <18)
Troponin I (High Sensitivity): 5 ng/L (ref <18)

## 2022-11-10 MED ORDER — TACROLIMUS ER 0.75 MG PO TB24: 1.5000 mg | ORAL_TABLET | Freq: Every day | ORAL | Status: DC

## 2022-11-10 MED ORDER — LEVOTHYROXINE SODIUM 100 MCG PO TABS
100.0000 ug | ORAL_TABLET | Freq: Every day | ORAL | Status: DC
  Administered 2022-11-11 – 2022-11-13 (×3): 100 ug via ORAL
  Filled 2022-11-10 (×3): qty 1

## 2022-11-10 MED ORDER — TACROLIMUS ER 1 MG PO TB24
2.0000 mg | ORAL_TABLET | Freq: Every day | ORAL | Status: DC
  Administered 2022-11-10 – 2022-11-11 (×2): 2 mg via ORAL
  Filled 2022-11-10 (×2): qty 2

## 2022-11-10 MED ORDER — ACETAMINOPHEN 650 MG RE SUPP: 650.0000 mg | Freq: Four times a day (QID) | RECTAL | Status: DC | PRN

## 2022-11-10 MED ORDER — MYCOPHENOLATE MOFETIL 250 MG PO CAPS
250.0000 mg | ORAL_CAPSULE | Freq: Two times a day (BID) | ORAL | Status: DC
  Administered 2022-11-10 – 2022-11-13 (×6): 250 mg via ORAL
  Filled 2022-11-10 (×8): qty 1

## 2022-11-10 MED ORDER — ONDANSETRON HCL 4 MG PO TABS: 4.0000 mg | ORAL_TABLET | Freq: Four times a day (QID) | ORAL | Status: DC | PRN

## 2022-11-10 MED ORDER — AMLODIPINE BESYLATE 5 MG PO TABS
5.0000 mg | ORAL_TABLET | Freq: Every day | ORAL | Status: DC
  Administered 2022-11-10 – 2022-11-12 (×3): 5 mg via ORAL
  Filled 2022-11-10 (×4): qty 1

## 2022-11-10 MED ORDER — ACETAMINOPHEN 325 MG PO TABS
650.0000 mg | ORAL_TABLET | Freq: Four times a day (QID) | ORAL | Status: DC | PRN
  Administered 2022-11-10 – 2022-11-13 (×2): 650 mg via ORAL
  Filled 2022-11-10: qty 2

## 2022-11-10 MED ORDER — CLOPIDOGREL BISULFATE 75 MG PO TABS
75.0000 mg | ORAL_TABLET | Freq: Every day | ORAL | Status: DC
  Filled 2022-11-10 (×3): qty 1

## 2022-11-10 MED ORDER — ENOXAPARIN SODIUM 40 MG/0.4ML IJ SOSY: 40.0000 mg | PREFILLED_SYRINGE | INTRAMUSCULAR | Status: DC

## 2022-11-10 MED ORDER — ONDANSETRON HCL 4 MG/2ML IJ SOLN: 4.0000 mg | Freq: Four times a day (QID) | INTRAMUSCULAR | Status: DC | PRN

## 2022-11-10 MED ORDER — METOPROLOL SUCCINATE ER 50 MG PO TB24
25.0000 mg | ORAL_TABLET | Freq: Every day | ORAL | Status: DC
  Administered 2022-11-11 – 2022-11-12 (×2): 25 mg via ORAL
  Filled 2022-11-10 (×4): qty 1

## 2022-11-10 MED ORDER — NITROGLYCERIN 0.4 MG SL SUBL
0.4000 mg | SUBLINGUAL_TABLET | SUBLINGUAL | Status: DC | PRN
  Administered 2022-11-10: 0.4 mg via SUBLINGUAL
  Filled 2022-11-10: qty 1

## 2022-11-10 MED ORDER — SODIUM CHLORIDE 0.9 % IV BOLUS
500.0000 mL | Freq: Once | INTRAVENOUS | Status: AC
  Administered 2022-11-10: 500 mL via INTRAVENOUS

## 2022-11-10 NOTE — ED Triage Notes (Signed)
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 Pain went away shortly after resting for brief period. Pt states about 1am, she was woken up with the same pain to her chest. +nausea and sob. Does also report R arm pain for about a week

## 2022-11-10 NOTE — ED Provider Notes (Signed)
[facility]  Provider Note  CSN: [id number] Arrival date & time: 11/10/22 0112  History Chief Complaint  Patient presents with   Chest Pain    Yvette Jenkins is a 67 y.o. female with history of HTN, ESRD previously on dialysis but now s/p kidney transplant here for evaluation of intermittent chest pain for the last 12+ hours. 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 Was feeling better when she went to bed but woken up with pain again around 0100hrs prompting her ED visit tonight. Since arrival she has had several brief episode of chest pressure, mostly after getting up and walking to radiology or to change the thermostat in her room. She had a heart cath pre-transplant in 2017 which showed non-occlusive disease and a Coronary CT in Nov 2023 showing calcium score of 529. She states she never heard back from anyone about the CT. She is allergic to aspirin.    Home Medications Prior to Admission medications   Medication Sig Start Date End Date Taking? Authorizing Provider  Alirocumab (PRALUENT) 75 MG/ML SOAJ Inject 1 mL into the skin every 14 (fourteen) days. 05/10/22   Lorretta Harp, MD  amLODipine (NORVASC) 2.5 MG tablet Take 2.5 mg by mouth 2 (two) times daily. 06/07/20   [provider]  Baclofen 5 MG TABS Take 5 mg by mouth 2 (two) times daily as needed. 02/13/22   Raspet, Derry Skill, PA-C  cefUROXime (CEFTIN) 500 MG tablet 1 tablet 04/05/22   [provider]  Cholecalciferol (VITAMIN D3) 125 MCG (5000 UT) CAPS Take by mouth.    [provider]  Cyanocobalamin (VITAMIN B12) 1000 MCG TBCR Take by mouth.    [provider]  doxycycline (VIBRA-TABS) 100 MG tablet 1 tablet 04/10/22   [provider]  lidocaine (LIDODERM) 5 % Place 1 patch onto the skin daily. Remove & Discard patch within 12 hours or as directed by MD 03/28/22   Regan Lemming,  MD  Menaquinone-7 (VITAMIN K2 PO) Take by mouth.    [provider]  mycophenolate (CELLCEPT) 250 MG capsule Take 250 mg by mouth 2 (two) times daily. 9am and 9pm    [provider]  mycophenolate (CELLCEPT) 250 MG capsule Take by mouth. 11/07/21   [provider]  ondansetron (ZOFRAN-ODT) 4 MG disintegrating tablet 1 tablet on the tongue and allow to dissolve 04/10/22   [provider]  predniSONE (DELTASONE) 1 MG tablet prednisone    [provider]  Prenatal Multivit-Min-Fe-FA (PRENATAL, W/IRON & FA,) 27-0.8 MG TABS Take 1 tablet by mouth daily.    [provider]  Propylene Glycol (SYSTANE BALANCE) 0.6 % SOLN 1-2 drops    [provider]  Propylene Glycol 0.6 % SOLN Apply to eye.    [provider]  SYNTHROID 125 MCG tablet Take 1 tablet (125 mcg total) by mouth daily before breakfast. 03/24/21 03/24/22  Minette Brine, FNP  tacrolimus ER (ENVARSUS XR) 0.75 MG TB24 tablet Envarsus XR    [provider]  Tacrolimus ER (ENVARSUS XR) 1 MG TB24 Take by mouth. '2mg'$  tab daily    [provider]     Allergies    Amoxicillin-pot clavulanate, Other, Aspirin, Tetracyclines & related, and Statins   Review of Systems   Review of Systems Please see HPI for pertinent positives and negatives  Physical Exam BP 121/80   Pulse 76  Temp 97.7 F (36.5 C) (Oral)   Resp (!) 26   Wt 74.4 kg   SpO2 100%   BMI 28.15 kg/m   Physical Exam Vitals and nursing note reviewed.  Constitutional:      Appearance: Normal appearance.  HENT:     Head: Normocephalic and atraumatic.     Nose: Nose normal.     Mouth/Throat:     Mouth: Mucous membranes are moist.  Eyes:     Extraocular Movements: Extraocular movements intact.     Conjunctiva/sclera: Conjunctivae normal.  Cardiovascular:     Rate and Rhythm: Normal rate.  Pulmonary:     Effort: Pulmonary effort is normal.     Breath sounds: Normal breath sounds.  Chest:      Chest wall: No tenderness.  Abdominal:     General: Abdomen is flat.     Palpations: Abdomen is soft.     Tenderness: There is no abdominal tenderness.  Musculoskeletal:        General: No swelling. Normal range of motion.     Cervical back: Neck supple.  Skin:    General: Skin is warm and dry.  Neurological:     General: No focal deficit present.     Mental Status: She is alert.  Psychiatric:        Mood and Affect: Mood normal.     ED Results / Procedures / Treatments   EKG EKG Interpretation  Date/Time:  Friday November 10 2022 01:28:48 EST Ventricular Rate:  72 PR Interval:  154 QRS Duration: 80 QT Interval:  372 QTC Calculation: 407 R Axis:   80 Text Interpretation: Normal sinus rhythm Normal ECG When compared with ECG of 28-Mar-2022 10:53, No significant change was found Confirmed by Calvert Cantor [phone]) on 11/10/2022 3:19:48 AM  Procedures Procedures  Medications Ordered in the ED Medications  nitroGLYCERIN (NITROSTAT) SL tablet 0.4 mg (0.4 mg Sublingual Given 11/10/22 0424)  sodium chloride 0.9 % bolus 500 mL (500 mLs Intravenous New Bag/Given 11/10/22 0459)    Initial Impression and Plan  Patient here with symptoms concerning for unstable angina. EKG is unremarkable but has had recent CT with abnormal coronary calcium score. Will give NTG for pain. Labs done in triage show BMP with CKD at baseline. CBC is normal. Trop neg x 2. I personally viewed the images from radiology studies and agree with radiologist interpretation: CXR is clear. Will plan admission for further management. Patient amenable to that plan.   ED Course   Clinical Course as of 11/10/22 0505  Fri Nov 10, 2022  0434 Patient's pressure dropped after NTG, saline bolus ordered, will continue to monitor.  [CS]  [phone] Spoke with Dr. Myna Hidalgo, Hospitalist, who will accept for admission.  [CS]    Clinical Course User Index [CS] Truddie Hidden, MD     MDM Rules/Calculators/A&P Medical Decision  Making Problems Addressed: Unstable angina [facility]): acute illness or injury  Amount and/or Complexity of Data Reviewed Labs: ordered. Decision-making details documented in ED Course. Radiology: ordered and independent interpretation performed. Decision-making details documented in ED Course. ECG/medicine tests: ordered and independent interpretation performed. Decision-making details documented in ED Course.  Risk Prescription drug management. Decision regarding hospitalization.     Final Clinical Impression(s) / ED Diagnoses Final diagnoses:  Unstable angina [facility])    Rx / DC Orders ED Discharge Orders     None        Truddie Hidden, MD 11/10/22 [phone]

## 2022-11-10 NOTE — H&P (Signed)
History and Physical    Patient: Yvette Jenkins [id number] DOB: May 31, 1956 DOA: 11/10/2022 DOS: the patient was seen and examined on 11/10/2022 PCP: Harlan Stains, MD  Patient coming from: Home  Chief Complaint:  Chief Complaint  Patient presents with   Chest Pain   HPI: Yvette Jenkins is a 67 y.o. female with medical history significant of right renal cyst kidney, history of gross hematuria, history of Graves' disease, history of hypothyroidism, hypertension, nephrolithiasis of the left kidney, history of ESRD, history of renal transplant, history of secondary hyperparathyroidism, nonobstructive one-vessel CAD per cardiac cath done [facility] on 10/26/2015 who presented to the emergency [place] with pressure-like precordial chest pain radiated to her back associated with nausea and dyspnea that woke her up from sleep.  She has been having on and off chest pain, but not as intense.  She had a milder episode in the evening while she was sleeping.  No palpitations, diaphoresis, orthopnea or pitting edema of the lower extremities.  She was seen by Dr. Gwenlyn Found in August 2023 who sent her for a coronary calcium score.  This was done by Dr. Johnsie Cancel on 07/19/2022 and at that time it was recommended that she undergo a perfusion study.  Her maternal grandmother and mother have a history of CAD/MI.  No significant history of smoking.  She denied fever, chills, rhinorrhea, sore throat, wheezing or hemoptysis.  No abdominal pain, nausea, emesis, diarrhea, constipation, melena or hematochezia.  No flank pain, dysuria, frequency or hematuria.  No polyuria, polydipsia, polyphagia or blurred vision.   Lab work: Her CBC was normal.  Troponin x 2 unremarkable.  BMP with a creatinine of 1.4, calcium 10.4, the rest of the measurements were normal.  EKG was normal sinus rhythm with no ischemic features.  Imaging: 2 view chest radiograph with stable chronic elevation  of the left hemidiaphragm with left base scaring.  No active disease.  ED course: Initial vital signs were temperature 97.7 F, pulse 75, respiration 20, BP 132/79 mmHg O2 sat 100% on room air.  The patient received sublingual nitroglycerin and her blood pressure decreased to 83/64 mmHg.  She was given 500 mL of normal saline bolus afterwards.   Review of Systems: As mentioned in the history of present illness. All other systems reviewed and are negative. Past Medical History:  Diagnosis Date   Acquired renal cyst of right kidney    Anemia associated with chronic renal failure    IV Iron infusion    Arthritis    Coronary artery disease    per pt cardiac cath at [facility] 10-26-2015  non-obstructive cad of one vessel   End-stage renal disease on peritoneal dialysis [facility])    nephrologist-  dr Justin Mend [facility])  daily dialysis start 7-8pm to 7-8am-- currently on kidney transplant list at [facility]   Gross hematuria    History of Graves' disease    Hypertension    Hypothyroidism, postradioiodine therapy    Kidney transplant candidate    on waiting list at [facility]   Left nephrolithiasis    non-obstructive per pt    PONV (postoperative nausea and vomiting)    Secondary hyperparathyroidism of renal origin [facility])    Past Surgical History:  Procedure Laterality Date   CARDIAC CATHETERIZATION  11-17-2015   [facility]- dr Marcello Moores povsic   abnormal stress test w/ ischemia (received from nephrologist dr Justin Mend office) non-obstrucitve CAD- diffuse mid to distal LAD 60% and insignificant RCA,  stress test does  not really match up with this lesion and this lesion is likely to remain insignificant   CARDIOVASCULAR STRESS TEST  10/15/2015   Intermediate risk nuclear study w/ moderate size and intensity, basal to mid inferior wall reversible perfusion defect suggestive of ischemia/  ef 57%,  inferior and inferoseptal hypokinesis to dyskinesis   CESAREAN SECTION  1987  and 1976   CYSTOSCOPY W/ RETROGRADES  Bilateral 07/11/2016   Procedure: CYSTOSCOPY WITH RETROGRADE PYELOGRAM;  Surgeon: Nickie Retort, MD;  Location: [facility];  Service: Urology;  Laterality: Bilateral;   CYSTOSCOPY WITH BIOPSY Left 07/11/2016   Procedure: CYSTOSCOPY WITH  LEFT RENAL PELVIS BIOPSY;  Surgeon: Nickie Retort, MD;  Location: [facility];  Service: Urology;  Laterality: Left;   CYSTOSCOPY WITH STENT PLACEMENT Left 07/11/2016   Procedure: CYSTOSCOPY WITH STENT PLACEMENT;  Surgeon: Nickie Retort, MD;  Location: [facility];  Service: Urology;  Laterality: Left;   CYSTOSCOPY WITH URETEROSCOPY Left 07/11/2016   Procedure: CYSTOSCOPY WITH URETEROSCOPY;  Surgeon: Nickie Retort, MD;  Location: [facility];  Service: Urology;  Laterality: Left;   INSERTION OF DIALYSIS CATHETER  06-23-2015  at [facility]   Tenckhoff coiled peritoneal catheter   KIDNEY TRANSPLANT N/A 05/13/2018   Added a kidney to the bladder   NEPHRECTOMY TRANSPLANTED ORGAN     TRANSTHORACIC ECHOCARDIOGRAM  10/15/2015   poor acoustic windows limit study-  ef 50% with akinesis of the basal posterior, inferior , and lateral walls,  grade 1 diastolic dysfunction/;   WRIST ARTHROSCOPY W/ TRIANGULAR FIBROCARTILAGE REPAIR Right 03/10/2010   Social History:  reports that she has never smoked. She has never been exposed to tobacco smoke. She has never used smokeless tobacco. She reports that she does not drink alcohol and does not use drugs.  Allergies  Allergen Reactions   Amoxicillin-Pot Clavulanate Nausea Only    Other reaction(s): Vomiting   Other     Other reaction(s): Other (See Comments) S/P Renal Transplant Other reaction(s): Unknown Other reaction(s): Unknown   Aspirin Hives    Other reaction(s): Unknown   Tetracyclines & Related Hives, Other (See Comments) and Nausea Only    GI intolerance -"Messes with stomach" Other reaction(s): Unknown Messes with stomach     Statins     Other reaction(s): Unknown    Family History  Problem Relation Age of Onset   Hypertension Other    Diabetes Other    Renal Disease Mother    Heart attack Father     Prior to Admission medications   Medication Sig Start Date End Date Taking? Authorizing Provider  Alirocumab (PRALUENT) 75 MG/ML SOAJ Inject 1 mL into the skin every 14 (fourteen) days. 05/10/22   Lorretta Harp, MD  amLODipine (NORVASC) 2.5 MG tablet Take 2.5 mg by mouth 2 (two) times daily. 06/07/20   [provider]  Baclofen 5 MG TABS Take 5 mg by mouth 2 (two) times daily as needed. 02/13/22   Raspet, Derry Skill, PA-C  cefUROXime (CEFTIN) 500 MG tablet 1 tablet 04/05/22   [provider]  Cholecalciferol (VITAMIN D3) 125 MCG (5000 UT) CAPS Take by mouth.    [provider]  Cyanocobalamin (VITAMIN B12) 1000 MCG TBCR Take by mouth.    [provider]  doxycycline (VIBRA-TABS) 100 MG tablet 1 tablet 04/10/22   [provider]  lidocaine (LIDODERM) 5 % Place 1 patch onto the skin daily. Remove & Discard patch within 12 hours or  as directed by MD 03/28/22   Regan Lemming, MD  Menaquinone-7 (VITAMIN K2 PO) Take by mouth.    [provider]  mycophenolate (CELLCEPT) 250 MG capsule Take 250 mg by mouth 2 (two) times daily. 9am and 9pm    [provider]  mycophenolate (CELLCEPT) 250 MG capsule Take by mouth. 11/07/21   [provider]  ondansetron (ZOFRAN-ODT) 4 MG disintegrating tablet 1 tablet on the tongue and allow to dissolve 04/10/22   [provider]  predniSONE (DELTASONE) 1 MG tablet prednisone    [provider]  Prenatal Multivit-Min-Fe-FA (PRENATAL, W/IRON & FA,) 27-0.8 MG TABS Take 1 tablet by mouth daily.    [provider]  Propylene Glycol (SYSTANE BALANCE) 0.6 % SOLN 1-2 drops    [provider]  Propylene Glycol 0.6 % SOLN Apply to eye.    [provider]  SYNTHROID 125 MCG tablet Take 1 tablet  (125 mcg total) by mouth daily before breakfast. 03/24/21 03/24/22  Minette Brine, FNP  tacrolimus ER (ENVARSUS XR) 0.75 MG TB24 tablet Envarsus XR    [provider]  Tacrolimus ER (ENVARSUS XR) 1 MG TB24 Take by mouth. '2mg'$  tab daily    [provider]    Physical Exam: Vitals:   11/10/22 0945 11/10/22 1200 11/10/22 1207 11/10/22 1308  BP: 107/83 112/64  (!) 171/79  Pulse: 89 78  74  Resp: '20 17  16  '$ Temp: 98.6 F (37 C) 98.1 F (36.7 C)  98.5 F (36.9 C)  TempSrc: Oral Oral  Oral  SpO2: 99% 99%  100%  Weight:   74.4 kg   Height:   '5\' 4"'$  (1.626 m)    Physical Exam Vitals and nursing note reviewed.  Constitutional:      General: She is awake. She is not in acute distress.    Appearance: She is well-developed and overweight.  HENT:     Head: Normocephalic.     Nose: No rhinorrhea.     Mouth/Throat:     Mouth: Mucous membranes are moist.  Eyes:     General: No scleral icterus.    Pupils: Pupils are equal, round, and reactive to light.  Neck:     Vascular: No JVD.  Cardiovascular:     Rate and Rhythm: Normal rate and regular rhythm.     Heart sounds: S1 normal and S2 normal.  Pulmonary:     Breath sounds: No wheezing, rhonchi or rales.  Abdominal:     General: Bowel sounds are normal.     Palpations: Abdomen is soft.  Musculoskeletal:     Cervical back: Neck supple.     Right lower leg: No edema.     Left lower leg: No edema.  Skin:    General: Skin is warm and dry.  Neurological:     General: No focal deficit present.     Mental Status: She is alert and oriented to person, place, and time.  Psychiatric:        Mood and Affect: Mood normal.        Behavior: Behavior normal. Behavior is cooperative.   Data Reviewed:  Results are pending, will review when available.  Echo 10/15/2015 Study Conclusions   - Left ventricle: Poor acoustic windows limit study LVEF is    approximately 50% with akinesis of the basal posterior, basal    inferior and  basal lateral walls. The cavity size was normal.    Wall thickness was normal. Doppler parameters are consistent  with    abnormal left ventricular relaxation (grade 1 diastolic    dysfunction).  - Impressions: Poor acoustic windows limit study Consider limited    echo wtih echo contrast to evaluate LVEF and regional wall    motion.   Impressions:   - Poor acoustic windows limit study Consider limited echo wtih echo    contrast to evaluate LVEF and regional wall motion.   07/19/2022 Coronary Calcium Score   TECHNIQUE: The patient was scanned on a Siemens Somatom 64 slice scanner. Axial non-contrast 75m slices were carried out through the heart. The data set was analyzed on a dedicated work station and scored using the ARobins   FINDINGS: Non-cardiac: No significant non cardiac findings on limited lung and soft tissue windows. [facility]   Ascending Aorta: Moderate calcific atherosclerosis normal diameter 3.1 cm   Pericardium: Normal   Coronary arteries: LM and 3 vessel calcium noted   LM 96.4   LAD 318   LCX 19.5   RCA 95.3   Total:  529   IMPRESSION: Coronary calcium score of 529. This was 943th percentile for age and sex matched control.   Consider f/u perfusion study given score above   PJenkins Rouge  Electronically Signed: By: PJenkins RougeM.D. On: 07/19/2022 11:30  Assessment and Plan: Principal Problem:   Chest pain Previous history of nonobstructive CAD. Perfusion study recommended after recent calcium score. Observation/telemetry. Allergic to aspirin. Begin clopidogrel 75 mg p.o. daily. Continue amlodipine 5 mg p.o. daily. Begin metoprolol succinate 25 mg p.o. daily. Echocardiogram has been ordered. Requested cardiology consult for a.m.  Active Problems:   Hypothyroid/postablative hypothyroidism Continue levothyroxine 100 mcg p.o. daily.    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   (HCC) Continue mycophenolate and tacrolimus. Follow-up with nephrology and the transplant team as scheduled.    Atherosclerosis of aorta (HCC)   Hyperlipidemia Would benefit from statin therapy.    Overweight with body mass index (BMI) 25.0-29.9 Current BMI 28.15 kg/m. Continue lifestyle modifications. Follow-up with primary care provider.     Advance Care Planning:   Code Status: Full Code   Consults: Cardiology consult requested for the morning.  Family Communication: Her husband was present in her room.  Severity of Illness: The appropriate patient status for this patient is OBSERVATION. Observation status is judged to be reasonable and necessary in order to provide the required intensity of service to ensure the patient's safety. The patient's presenting symptoms, physical exam findings, and initial radiographic and laboratory data in the context of their medical condition is felt to place them at decreased risk for further clinical deterioration. Furthermore, it is anticipated that the patient will be medically stable for discharge from the hospital within 2 midnights of admission.   Author: DReubin Milan MD 11/10/2022 1:29 PM  For on call review [email or web address]   This document was prepared using Dragon voice recognition software and may contain some unintended transcription errors.

## 2022-11-10 NOTE — Progress Notes (Signed)
Plan of Care Note for accepted transfer   Patient: Yvette Jenkins MRN: [id number]   DOA: 11/10/2022  Facility requesting transfer: [facility]   Requesting Provider: Dr. Karle Starch   Reason for transfer: Chest pain   Facility course: 67 yr old lady with hx of hypothyroidism, HTN, HLD, ESRD s/p renal transplant, p/w intermittent chest pain since yesterday evening.   There is no acute ischemic features on EKG, no acute CXR findings, and troponin is normal x2. SBP dropped to 80s after she was given SL nitroglycerin. A 500 mL bolus of NS was then given.   Plan of care: The patient is accepted for admission to Telemetry unit, at [facility].   Author: Vianne Bulls, MD 11/10/2022  Check [email or web address] for on-call coverage.  Nursing staff, Please call [place] number on Amion as soon as patient's arrival, so appropriate admitting provider can evaluate the pt.

## 2022-11-10 NOTE — ED Notes (Signed)
Called [facility] -- informed that the patient Bed Assignment is Ready 

## 2022-11-11 ENCOUNTER — Observation Stay (HOSPITAL_COMMUNITY): Payer: MEDICARE

## 2022-11-11 DIAGNOSIS — E785 Hyperlipidemia, unspecified: Secondary | ICD-10-CM | POA: Diagnosis present

## 2022-11-11 DIAGNOSIS — M1990 Unspecified osteoarthritis, unspecified site: Secondary | ICD-10-CM | POA: Diagnosis present

## 2022-11-11 DIAGNOSIS — Z87442 Personal history of urinary calculi: Secondary | ICD-10-CM | POA: Diagnosis not present

## 2022-11-11 DIAGNOSIS — I700 Atherosclerosis of aorta: Secondary | ICD-10-CM | POA: Diagnosis present

## 2022-11-11 DIAGNOSIS — Z886 Allergy status to analgesic agent status: Secondary | ICD-10-CM | POA: Diagnosis not present

## 2022-11-11 DIAGNOSIS — Z79899 Other long term (current) drug therapy: Secondary | ICD-10-CM | POA: Diagnosis not present

## 2022-11-11 DIAGNOSIS — I120 Hypertensive chronic kidney disease with stage 5 chronic kidney disease or end stage renal disease: Secondary | ICD-10-CM | POA: Diagnosis present

## 2022-11-11 DIAGNOSIS — R0789 Other chest pain: Secondary | ICD-10-CM

## 2022-11-11 DIAGNOSIS — I70203 Unspecified atherosclerosis of native arteries of extremities, bilateral legs: Secondary | ICD-10-CM | POA: Diagnosis present

## 2022-11-11 DIAGNOSIS — N2581 Secondary hyperparathyroidism of renal origin: Secondary | ICD-10-CM | POA: Diagnosis present

## 2022-11-11 DIAGNOSIS — Z6828 Body mass index (BMI) 28.0-28.9, adult: Secondary | ICD-10-CM | POA: Diagnosis not present

## 2022-11-11 DIAGNOSIS — Z7952 Long term (current) use of systemic steroids: Secondary | ICD-10-CM | POA: Diagnosis not present

## 2022-11-11 DIAGNOSIS — Z881 Allergy status to other antibiotic agents status: Secondary | ICD-10-CM | POA: Diagnosis not present

## 2022-11-11 DIAGNOSIS — E890 Postprocedural hypothyroidism: Secondary | ICD-10-CM | POA: Diagnosis present

## 2022-11-11 DIAGNOSIS — Z888 Allergy status to other drugs, medicaments and biological substances status: Secondary | ICD-10-CM | POA: Diagnosis not present

## 2022-11-11 DIAGNOSIS — R079 Chest pain, unspecified: Secondary | ICD-10-CM

## 2022-11-11 DIAGNOSIS — E663 Overweight: Secondary | ICD-10-CM | POA: Diagnosis present

## 2022-11-11 DIAGNOSIS — Z8249 Family history of ischemic heart disease and other diseases of the circulatory system: Secondary | ICD-10-CM | POA: Diagnosis not present

## 2022-11-11 DIAGNOSIS — I2089 Other forms of angina pectoris: Secondary | ICD-10-CM | POA: Diagnosis present

## 2022-11-11 DIAGNOSIS — I200 Unstable angina: Secondary | ICD-10-CM | POA: Diagnosis present

## 2022-11-11 DIAGNOSIS — Z940 Kidney transplant status: Secondary | ICD-10-CM | POA: Diagnosis not present

## 2022-11-11 DIAGNOSIS — Z79624 Long term (current) use of inhibitors of nucleotide synthesis: Secondary | ICD-10-CM | POA: Diagnosis not present

## 2022-11-11 DIAGNOSIS — I25118 Atherosclerotic heart disease of native coronary artery with other forms of angina pectoris: Secondary | ICD-10-CM | POA: Diagnosis present

## 2022-11-11 DIAGNOSIS — I7092 Chronic total occlusion of artery of the extremities: Secondary | ICD-10-CM | POA: Diagnosis present

## 2022-11-11 DIAGNOSIS — Z880 Allergy status to penicillin: Secondary | ICD-10-CM | POA: Diagnosis not present

## 2022-11-11 DIAGNOSIS — Y842 Radiological procedure and radiotherapy as the cause of abnormal reaction of the patient, or of later complication, without mention of misadventure at the time of the procedure: Secondary | ICD-10-CM | POA: Diagnosis present

## 2022-11-11 DIAGNOSIS — Z79890 Hormone replacement therapy (postmenopausal): Secondary | ICD-10-CM | POA: Diagnosis not present

## 2022-11-11 DIAGNOSIS — Z7960 Long term (current) use of unspecified immunomodulators and immunosuppressants: Secondary | ICD-10-CM | POA: Diagnosis not present

## 2022-11-11 DIAGNOSIS — N1832 Chronic kidney disease, stage 3b: Secondary | ICD-10-CM | POA: Diagnosis present

## 2022-11-11 LAB — CBC
HCT: 38.6 % (ref 36.0–46.0)
Hemoglobin: 12.3 g/dL (ref 12.0–15.0)
MCH: 27.3 pg (ref 26.0–34.0)
MCHC: 31.9 g/dL (ref 30.0–36.0)
MCV: 85.6 fL (ref 80.0–100.0)
Platelets: 308 10*3/uL (ref 150–400)
RBC: 4.51 MIL/uL (ref 3.87–5.11)
RDW: 14.8 % (ref 11.5–15.5)
WBC: 7.6 10*3/uL (ref 4.0–10.5)
nRBC: 0 % (ref 0.0–0.2)

## 2022-11-11 LAB — COMPREHENSIVE METABOLIC PANEL
ALT: 10 U/L (ref 0–44)
AST: 14 U/L — ABNORMAL LOW (ref 15–41)
Albumin: 3.8 g/dL (ref 3.5–5.0)
Alkaline Phosphatase: 84 U/L (ref 38–126)
Anion gap: 6 (ref 5–15)
BUN: 18 mg/dL (ref 8–23)
CO2: 23 mmol/L (ref 22–32)
Calcium: 9.4 mg/dL (ref 8.9–10.3)
Chloride: 105 mmol/L (ref 98–111)
Creatinine, Ser: 1.27 mg/dL — ABNORMAL HIGH (ref 0.44–1.00)
GFR, Estimated: 47 mL/min — ABNORMAL LOW (ref >60)
Glucose, Bld: 97 mg/dL (ref 70–99)
Potassium: 4.2 mmol/L (ref 3.5–5.1)
Sodium: 134 mmol/L — ABNORMAL LOW (ref 135–145)
Total Bilirubin: 0.6 mg/dL (ref 0.3–1.2)
Total Protein: 7 g/dL (ref 6.5–8.1)

## 2022-11-11 LAB — ECHOCARDIOGRAM COMPLETE
Area-P 1/2: 3.27 cm2
Height: 64 in
S' Lateral: 2.8 cm
Weight: 2624 oz

## 2022-11-11 LAB — HIV ANTIBODY (ROUTINE TESTING W REFLEX): HIV Screen 4th Generation wRfx: NONREACTIVE

## 2022-11-11 MED ORDER — TACROLIMUS ER 1 MG PO TB24: 2.0000 mg | ORAL_TABLET | ORAL | Status: DC

## 2022-11-11 MED ORDER — TACROLIMUS ER 1 MG PO TB24
1.0000 mg | ORAL_TABLET | ORAL | Status: DC
  Administered 2022-11-12: 1 mg via ORAL
  Filled 2022-11-11: qty 1

## 2022-11-11 MED ORDER — MORPHINE SULFATE (PF) 2 MG/ML IV SOLN: 1.0000 mg | INTRAVENOUS | Status: DC | PRN

## 2022-11-11 NOTE — Progress Notes (Signed)
Pt complained of chest pain that lasted for 5 minutes at 3 in the morning, pt was just sleeping when the pain started. Vital signs taken, BP 119/84, HR 81; RR 22, O2 sat 100 on room air. EKG done. Patient does not want to take any pain medicine at that time. Made comfortable in the bed. K-pad in use. Charge nurse Minette Headland, RN made aware. On call Zebedee Iba, NP notified. No new orders. Will continue to monitor patient.

## 2022-11-11 NOTE — Progress Notes (Signed)
  Echocardiogram 2D Echocardiogram has been performed.  Yvette Jenkins 11/11/2022, 9:19 AM

## 2022-11-11 NOTE — Plan of Care (Signed)

## 2022-11-11 NOTE — Progress Notes (Signed)
PROGRESS NOTE    Yvette Jenkins  [id number] DOB: [birth year] DOA: 11/10/2022 PCP: Harlan Stains, MD    Brief Narrative:  Yvette Jenkins is a 67 y.o. female with medical history significant of right renal cyst kidney, history of gross hematuria, history of Graves' disease, history of hypothyroidism, hypertension, nephrolithiasis of the left kidney, history of ESRD, history of renal transplant, history of secondary hyperparathyroidism, nonobstructive one-vessel CAD per cardiac cath done [facility] on 10/26/2015 who presented to the emergency [place] with pressure-like precordial chest pain radiated to her back associated with nausea and dyspnea that woke her up from sleep.  She has been having on and off chest pain, but not as intense.  She had a milder episode in the evening while she was sleeping.  No palpitations, diaphoresis, orthopnea or pitting edema of the lower extremities.  She was seen by Dr. Gwenlyn Found in August 2023 who sent her for a coronary calcium score.  This was done by Dr. Johnsie Cancel on 07/19/2022 and at that time it was recommended that she undergo a perfusion study.  Her maternal grandmother and mother have a history of CAD/MI.  No significant history of smoking.  She denied fever, chills, rhinorrhea, sore throat, wheezing or hemoptysis.  No abdominal pain, nausea, emesis, diarrhea, constipation, melena or hematochezia.  No flank pain, dysuria, frequency or hematuria.  No polyuria, polydipsia, polyphagia or blurred vision.      Assessment and Plan:   Chest pain Previous history of nonobstructive CAD. Perfusion study recommended after recent calcium score. Allergic to aspirin.- plavix started but patient is hesitant to take Echocardiogram pending -cards consulted     Hypothyroid/postablative hypothyroidism Continue levothyroxine 100 mcg p.o. daily.     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  (HCC) Continue  mycophenolate and tacrolimus. Follow-up with nephrology and the transplant team as scheduled.     Atherosclerosis of aorta ([place])   Hyperlipidemia Would benefit from statin therapy but has a reaction to statins          DVT prophylaxis: enoxaparin (LOVENOX) injection 40 mg Start: 11/10/22 2200 SCDs Start: 11/10/22 1323    Code Status: Full Code   Disposition Plan:  Level of care: Telemetry Status is: Observation The patient remains OBS appropriate and will d/c before 2 midnights.    Consultants:  cards   Subjective: Episode of chest pain this AM Does not want to take plavix  Objective: Vitals:   11/11/22 0148 11/11/22 0308 11/11/22 0546 11/11/22 0932  BP: 103/71 119/84 105/63 135/86  Pulse: 75 81 70 90  Resp:  (!) '22 20 16  '$ Temp: 98.3 F (36.8 C)  97.7 F (36.5 C) 98.2 F (36.8 C)  TempSrc: Oral  Oral Oral  SpO2: 99% 100% 100% 100%  Weight:      Height:        Intake/Output Summary (Last 24 hours) at 11/11/2022 1036 Last data filed at 11/11/2022 0933 Gross per 24 hour  Intake 960 ml  Output 900 ml  Net 60 ml   Filed Weights   11/10/22 0121 11/10/22 1207  Weight: 74.4 kg 74.4 kg    Examination:   General: Appearance:     Overweight female in no acute distress     Lungs:     respirations unlabored  Heart:    Normal heart rate. Normal rhythm. No murmurs, rubs, or gallops.    MS:   All extremities are intact.    Neurologic:   Awake,  alert, oriented x 3. No apparent focal neurological           defect.        Data Reviewed: I have personally reviewed following labs and imaging studies  CBC: Recent Labs  Lab 11/10/22 0128 11/11/22 0543  WBC 10.2 7.6  HGB 12.1 12.3  HCT 37.3 38.6  MCV 84.2 85.6  PLT 330 [id number]   Basic Metabolic Panel: Recent Labs  Lab 11/10/22 0128 11/11/22 0543  NA 140 134*  K 4.1 4.2  CL 108 105  CO2 22 23  GLUCOSE 83 97  BUN 23 18  CREATININE 1.40* 1.27*  CALCIUM 10.4* 9.4   GFR: Estimated Creatinine  Clearance: 43.1 mL/min (A) (by C-G formula based on SCr of 1.27 mg/dL (H)). Liver Function Tests: Recent Labs  Lab 11/11/22 0543  AST 14*  ALT 10  ALKPHOS 84  BILITOT 0.6  PROT 7.0  ALBUMIN 3.8   No results for input(s): "LIPASE", "AMYLASE" in the last 168 hours. No results for input(s): "AMMONIA" in the last 168 hours. Coagulation Profile: No results for input(s): "INR", "PROTIME" in the last 168 hours. Cardiac Enzymes: No results for input(s): "CKTOTAL", "CKMB", "CKMBINDEX", "TROPONINI" in the last 168 hours. BNP (last 3 results) No results for input(s): "PROBNP" in the last 8760 hours. HbA1C: No results for input(s): "HGBA1C" in the last 72 hours. CBG: No results for input(s): "GLUCAP" in the last 168 hours. Lipid Profile: No results for input(s): "CHOL", "HDL", "LDLCALC", "TRIG", "CHOLHDL", "LDLDIRECT" in the last 72 hours. Thyroid Function Tests: No results for input(s): "TSH", "T4TOTAL", "FREET4", "T3FREE", "THYROIDAB" in the last 72 hours. Anemia Panel: No results for input(s): "VITAMINB12", "FOLATE", "FERRITIN", "TIBC", "IRON", "RETICCTPCT" in the last 72 hours. Sepsis Labs: No results for input(s): "PROCALCITON", "LATICACIDVEN" in the last 168 hours.  No results found for this or any previous visit (from the past 240 hour(s)).       Radiology Studies: DG Chest 2 View  Result Date: 11/10/2022 CLINICAL DATA:  Chest pain EXAM: CHEST - 2 VIEW COMPARISON:  03/28/2022 FINDINGS: Stable chronic elevation of the left hemidiaphragm with left base scarring. Right lung clear. Heart mediastinal contours within normal limits. No acute bony abnormality. IMPRESSION: Stable chronic elevation of the left hemidiaphragm with left base scarring. No active disease. Electronically Signed   By: Rolm Baptise M.D.   On: 11/10/2022 02:20        Scheduled Meds:  amLODipine  5 mg Oral Daily   clopidogrel  75 mg Oral Daily   enoxaparin (LOVENOX) injection  40 mg Subcutaneous Q24H    levothyroxine  100 mcg Oral Q0600   metoprolol succinate  25 mg Oral Daily   mycophenolate  250 mg Oral BID   tacrolimus ER  2 mg Oral QAC breakfast   Continuous Infusions:   LOS: 0 days    Time spent: 45 min minutes spent on chart review, discussion with nursing staff, consultants, updating family and interview/physical exam; more than 50% of that time was spent in counseling and/or coordination of care.    Geradine Girt, DO [facility] Available via Epic secure chat 7am-7pm After these hours, please refer to coverage provider listed on [email or web address] 11/11/2022, 10:36 AM

## 2022-11-11 NOTE — TOC CM/SW Note (Signed)
Transition of Care [facility]) Screening Note  Patient Details  Name: Yvette Jenkins Date of Birth: 1956-07-15  Transition of Care [facility]) CM/SW Contact:    Sherie Don, LCSW Phone Number: 11/11/2022, 2:47 PM  Transition of Care Department [facility]) has reviewed patient and no TOC needs have been identified at this time. We will continue to monitor patient advancement through interdisciplinary progression rounds. If new patient transition needs arise, please place a TOC consult.

## 2022-11-11 NOTE — Consult Note (Addendum)
Cardiology Consultation   Patient ID: Alysia Morgese MRN: [id number]; DOB: 1956/06/07  Admit date: 11/10/2022 Date of Consult: 11/11/2022  PCP:  Harlan Stains, MD   [place] Providers Cardiologist:  Gwenlyn Found    Patient Profile:   Cheisea Murrah is a 67 y.o. female with a hx of HTN,HL, nonobstructrve CAD, who is being seen 11/11/2022 for the evaluation of chest pain at the request of Dr.Vann  History of Present Illness:   Ms. Llera 67 yo female history of HTN, HL with statin intolerance, history of renal transplant, nonobstructive one-vessel CAD per cardiac cath done [facility] on 10/26/2015 presents with chest pain.  First episode about 1 month ago. Came on with activity. 6/10 left sided chest pressure with SOB, lasted about 15 minutes. Repeat episode this past Thursday while sweeping. 7/10 pain with SOB, lasted about 20 minutes. Repeat episode 1AM day of presentation 7/10 with SOB, and also later at 305AM 8/10, this episoe was improved with SL NG.   K 4.1 Cr 1.4 BUN 23 WBC 10.2 Hgb 12.1 Plt 330  Trop 4-->5 CXR no acute process EKG NSR, no ishcemic changes  07/2022 coronary calcium score: Coronary calcium score of 529. This was 72 th percentile for age and sex matched control.  2017 echo: LVEF 50%, akinesis basal posterior, basal inferior, and basal lateral walls 2017 nuclear stress: Moderate size and intensity, basal to mid inferior wall reversible perfusion defect suggestive of ischemia   2018stress echo: no ishcemia 2019 stress echo [facility]: no ischemia      Past Medical History:  Diagnosis Date   Acquired renal cyst of right kidney    Anemia associated with chronic renal failure    IV Iron infusion    Arthritis    Coronary artery disease    per pt cardiac cath at [facility] 10-26-2015  non-obstructive cad of one vessel   End-stage renal disease on peritoneal dialysis [facility])    nephrologist-  dr Justin Mend [facility])  daily  dialysis start 7-8pm to 7-8am-- currently on kidney transplant list at [facility]   Gross hematuria    History of Graves' disease    Hypertension    Hypothyroidism, postradioiodine therapy    Kidney transplant candidate    on waiting list at [facility]   Left nephrolithiasis    non-obstructive per pt    PONV (postoperative nausea and vomiting)    Secondary hyperparathyroidism of renal origin [facility])     Past Surgical History:  Procedure Laterality Date   CARDIAC CATHETERIZATION  11-17-2015   [facility]- dr Marcello Moores povsic   abnormal stress test w/ ischemia (received from nephrologist dr Justin Mend office) non-obstrucitve CAD- diffuse mid to distal LAD 60% and insignificant RCA,  stress test does not really match up with this lesion and this lesion is likely to remain insignificant   CARDIOVASCULAR STRESS TEST  10/15/2015   Intermediate risk nuclear study w/ moderate size and intensity, basal to mid inferior wall reversible perfusion defect suggestive of ischemia/  ef 57%,  inferior and inferoseptal hypokinesis to dyskinesis   CESAREAN SECTION  1987  and 1976   CYSTOSCOPY W/ RETROGRADES Bilateral 07/11/2016   Procedure: CYSTOSCOPY WITH RETROGRADE PYELOGRAM;  Surgeon: Nickie Retort, MD;  Location: [facility];  Service: Urology;  Laterality: Bilateral;   CYSTOSCOPY WITH BIOPSY Left 07/11/2016   Procedure: CYSTOSCOPY WITH  LEFT RENAL PELVIS BIOPSY;  Surgeon: Nickie Retort, MD;  Location: [facility];  Service: Urology;  Laterality:  Left;   CYSTOSCOPY WITH STENT PLACEMENT Left 07/11/2016   Procedure: CYSTOSCOPY WITH STENT PLACEMENT;  Surgeon: Nickie Retort, MD;  Location: [facility];  Service: Urology;  Laterality: Left;   CYSTOSCOPY WITH URETEROSCOPY Left 07/11/2016   Procedure: CYSTOSCOPY WITH URETEROSCOPY;  Surgeon: Nickie Retort, MD;  Location: [facility];  Service: Urology;  Laterality: Left;   INSERTION OF DIALYSIS CATHETER   06-23-2015  at [facility]   Tenckhoff coiled peritoneal catheter   KIDNEY TRANSPLANT N/A 05/13/2018   Added a kidney to the bladder   NEPHRECTOMY TRANSPLANTED ORGAN     TRANSTHORACIC ECHOCARDIOGRAM  10/15/2015   poor acoustic windows limit study-  ef 50% with akinesis of the basal posterior, inferior , and lateral walls,  grade 1 diastolic dysfunction/;   WRIST ARTHROSCOPY W/ TRIANGULAR FIBROCARTILAGE REPAIR Right 03/10/2010      Inpatient Medications: Scheduled Meds:  amLODipine  5 mg Oral Daily   clopidogrel  75 mg Oral Daily   enoxaparin (LOVENOX) injection  40 mg Subcutaneous Q24H   levothyroxine  100 mcg Oral Q0600   metoprolol succinate  25 mg Oral Daily   mycophenolate  250 mg Oral BID   tacrolimus ER  2 mg Oral QAC breakfast   Continuous Infusions:  PRN Meds: acetaminophen **OR** acetaminophen, nitroGLYCERIN, ondansetron **OR** ondansetron (ZOFRAN) IV  Allergies:    Allergies  Allergen Reactions   Amoxicillin-Pot Clavulanate Nausea And Vomiting   Other     S/P Renal Transplant    Aspirin Hives   Tetracyclines & Related Hives, Nausea Only and Other (See Comments)    GI intolerance -"Messes with stomach"      Statins Other (See Comments)    Leg pain     Social History:   Social History   Socioeconomic History   Marital status: Married    Spouse name: Not on file   Number of children: Not on file   Years of education: Not on file   Highest education level: Not on file  Occupational History   Occupation: Child care  Tobacco Use   Smoking status: Never    Passive exposure: Never   Smokeless tobacco: Never  Vaping Use   Vaping Use: Never used  Substance and Sexual Activity   Alcohol use: No   Drug use: No   Sexual activity: Not Currently  Other Topics Concern   Not on file  Social History Narrative   Not on file   Social Determinants of Health   Financial Resource Strain: Low Risk  (05/26/2021)   Overall Financial Resource Strain (CARDIA)     Difficulty of Paying Living Expenses: Not hard at all  Food Insecurity: No Food Insecurity (11/10/2022)   Hunger Vital Sign    Worried About Running Out of Food in the Last Year: Never true    Ran Out of Food in the Last Year: Never true  Transportation Needs: No Transportation Needs (11/10/2022)   PRAPARE - Hydrologist (Medical): No    Lack of Transportation (Non-Medical): No  Physical Activity: Inactive (05/26/2021)   Exercise Vital Sign    Days of Exercise per Week: 0 days    Minutes of Exercise per Session: 0 min  Stress: No Stress Concern Present (05/26/2021)   [place]    Feeling of Stress : Not at all  Social Connections: Not on file  Intimate Partner Violence: Not At Risk (11/10/2022)  Humiliation, Afraid, Rape, and Kick questionnaire    Fear of Current or Ex-Partner: No    Emotionally Abused: No    Physically Abused: No    Sexually Abused: No    Family History:    Family History  Problem Relation Age of Onset   Hypertension Other    Diabetes Other    Renal Disease Mother    Heart attack Father      ROS:  Please see the history of present illness.   All other ROS reviewed and negative.     Physical Exam/Data:   Vitals:   11/10/22 2142 11/11/22 0148 11/11/22 0308 11/11/22 0546  BP: 125/76 103/71 119/84 105/63  Pulse: 81 75 81 70  Resp: 20  (!) 22 20  Temp: 98.2 F (36.8 C) 98.3 F (36.8 C)  97.7 F (36.5 C)  TempSrc: Oral Oral  Oral  SpO2: 98% 99% 100% 100%  Weight:      Height:        Intake/Output Summary (Last 24 hours) at 11/11/2022 0713 Last data filed at 11/11/2022 0600 Gross per 24 hour  Intake 720 ml  Output 900 ml  Net -180 ml      11/10/2022   12:07 PM 11/10/2022    1:21 AM 04/18/2022    1:57 PM  Last 3 Weights  Weight (lbs) 164 lb 164 lb 167 lb 9.6 oz  Weight (kg) 74.39 kg 74.39 kg 76.023 kg     Body mass index is 28.15 kg/m.  General:  Well  nourished, well developed, in no acute distress HEENT: normal Neck: no JVD Vascular: No carotid bruits; Distal pulses 2+ bilaterally Cardiac:  normal S1, S2; RRR; no murmur  Lungs:  clear to auscultation bilaterally, no wheezing, rhonchi or rales  Abd: soft, nontender, no hepatomegaly  Ext: no edema Musculoskeletal:  No deformities, BUE and BLE strength normal and equal Skin: warm and dry  Neuro:  CNs 2-12 intact, no focal abnormalities noted Psych:  Normal affect    Laboratory Data:  High Sensitivity Troponin:   Recent Labs  Lab 11/10/22 0128 11/10/22 0330  TROPONINIHS 4 5     Chemistry Recent Labs  Lab 11/10/22 0128 11/11/22 0543  NA 140 134*  K 4.1 4.2  CL 108 105  CO2 22 23  GLUCOSE 83 97  BUN 23 18  CREATININE 1.40* 1.27*  CALCIUM 10.4* 9.4  GFRNONAA 41* 47*  ANIONGAP 10 6    Recent Labs  Lab 11/11/22 0543  PROT 7.0  ALBUMIN 3.8  AST 14*  ALT 10  ALKPHOS 84  BILITOT 0.6   Lipids No results for input(s): "CHOL", "TRIG", "HDL", "LABVLDL", "LDLCALC", "CHOLHDL" in the last 168 hours.  Hematology Recent Labs  Lab 11/10/22 0128 11/11/22 0543  WBC 10.2 7.6  RBC 4.43 4.51  HGB 12.1 12.3  HCT 37.3 38.6  MCV 84.2 85.6  MCH 27.3 27.3  MCHC 32.4 31.9  RDW 14.7 14.8  PLT 330 308   Thyroid No results for input(s): "TSH", "FREET4" in the last 168 hours.  BNPNo results for input(s): "BNP", "PROBNP" in the last 168 hours.  DDimer No results for input(s): "DDIMER" in the last 168 hours.   Radiology/Studies:  DG Chest 2 View  Result Date: 11/10/2022 CLINICAL DATA:  Chest pain EXAM: CHEST - 2 VIEW COMPARISON:  03/28/2022 FINDINGS: Stable chronic elevation of the left hemidiaphragm with left base scarring. Right lung clear. Heart mediastinal contours within normal limits. No acute bony abnormality. IMPRESSION: Stable chronic  elevation of the left hemidiaphragm with left base scarring. No active disease. Electronically Signed   By: Rolm Baptise M.D.   On:  11/10/2022 02:20     Assessment and Plan:   1.Chest pain -extensive prior ischemic testing as documented above, most recently stress echo 2019 at [facility] was benign - notes describe a cath at [facility] 2017 with nonobstructive single vessel disease, I cannot find the full report on care everywhere - progressing symptoms of chest pain that by history are concerning for unstable angina.  - no objective evidence of ischemia by EKG or enzymes thus far - echo is pending. Would anticipate either stress test vs cath pending clinical course and echo results. DId have repeat chest pain this AM - significant hypotension with SL NG in ER, would use prn morphine instead if needed.   Medical therapy with plavix 75 (ASA allergy), toprol 25. Statin intolerant - can hold on anticoag at this time, if trop elevation, EKG changes, or ongoing recurrent pain would start at that time.   2. History of renal transplant - on immunosuppresants.      For questions or updates, please contact [place] Please consult [email or web address] for contact info under    Signed, Carlyle Dolly, MD  11/11/2022 7:13 AM

## 2022-11-12 DIAGNOSIS — R0789 Other chest pain: Secondary | ICD-10-CM | POA: Diagnosis not present

## 2022-11-12 DIAGNOSIS — I200 Unstable angina: Secondary | ICD-10-CM | POA: Diagnosis not present

## 2022-11-12 MED ORDER — TACROLIMUS ER 1 MG PO TB24
2.0000 mg | ORAL_TABLET | Freq: Every day | ORAL | Status: DC
  Administered 2022-11-13: 2 mg via ORAL
  Filled 2022-11-12 (×2): qty 2

## 2022-11-12 MED ORDER — PREDNISONE 5 MG PO TABS
5.0000 mg | ORAL_TABLET | Freq: Every day | ORAL | Status: DC
  Filled 2022-11-12: qty 1

## 2022-11-12 NOTE — Plan of Care (Signed)

## 2022-11-12 NOTE — Progress Notes (Signed)
Rounding Note    Patient Name: Yvette Jenkins Date of Encounter: 11/12/2022  [place] Cardiologist: New  Subjective   Mild chest pain yesterday  Inpatient Medications    Scheduled Meds:  amLODipine  5 mg Oral Daily   clopidogrel  75 mg Oral Daily   enoxaparin (LOVENOX) injection  40 mg Subcutaneous Q24H   levothyroxine  100 mcg Oral Q0600   metoprolol succinate  25 mg Oral Daily   mycophenolate  250 mg Oral BID   tacrolimus ER  1 mg Oral QODAY   [START ON 11/13/2022] tacrolimus ER  2 mg Oral QODAY   Continuous Infusions:  PRN Meds: acetaminophen **OR** acetaminophen, morphine injection, ondansetron **OR** ondansetron (ZOFRAN) IV   Vital Signs    Vitals:   11/11/22 1600 11/11/22 1912 11/11/22 1957 11/12/22 0517  BP:  (!) 129/95 (!) 134/90 100/62  Pulse:  72 68 60  Resp: '20 16 20 16  '$ Temp:  98.8 F (37.1 C) 98.1 F (36.7 C) 98.2 F (36.8 C)  TempSrc:  Oral Oral Oral  SpO2:  97% 98% 100%  Weight:      Height:        Intake/Output Summary (Last 24 hours) at 11/12/2022 0725 Last data filed at 11/12/2022 0558 Gross per 24 hour  Intake 900 ml  Output 2400 ml  Net -1500 ml      11/10/2022   12:07 PM 11/10/2022    1:21 AM 04/18/2022    1:57 PM  Last 3 Weights  Weight (lbs) 164 lb 164 lb 167 lb 9.6 oz  Weight (kg) 74.39 kg 74.39 kg 76.023 kg      Telemetry    SR - Personally Reviewed  ECG    N/a - Personally Reviewed  Physical Exam   GEN: No acute distress.   Neck: No JVD Cardiac: RRR, no murmurs, rubs, or gallops.  Respiratory: Clear to auscultation bilaterally. GI: Soft, nontender, non-distended  MS: No edema; No deformity. Neuro:  Nonfocal  Psych: Normal affect   Labs    High Sensitivity Troponin:   Recent Labs  Lab 11/10/22 0128 11/10/22 0330  TROPONINIHS 4 5     Chemistry Recent Labs  Lab 11/10/22 0128 11/11/22 0543  NA 140 134*  K 4.1 4.2  CL 108 105  CO2 22 23  GLUCOSE 83 97  BUN 23 18  CREATININE 1.40*  1.27*  CALCIUM 10.4* 9.4  PROT  --  7.0  ALBUMIN  --  3.8  AST  --  14*  ALT  --  10  ALKPHOS  --  84  BILITOT  --  0.6  GFRNONAA 41* 47*  ANIONGAP 10 6    Lipids No results for input(s): "CHOL", "TRIG", "HDL", "LABVLDL", "LDLCALC", "CHOLHDL" in the last 168 hours.  Hematology Recent Labs  Lab 11/10/22 0128 11/11/22 0543  WBC 10.2 7.6  RBC 4.43 4.51  HGB 12.1 12.3  HCT 37.3 38.6  MCV 84.2 85.6  MCH 27.3 27.3  MCHC 32.4 31.9  RDW 14.7 14.8  PLT 330 308   Thyroid No results for input(s): "TSH", "FREET4" in the last 168 hours.  BNPNo results for input(s): "BNP", "PROBNP" in the last 168 hours.  DDimer No results for input(s): "DDIMER" in the last 168 hours.   Radiology    ECHOCARDIOGRAM COMPLETE  Result Date: 11/11/2022    ECHOCARDIOGRAM REPORT   Patient Name:   Yvette Jenkins Date of Exam: 11/11/2022 Medical Rec #:  [id number]  Height:       64.0 in Accession #:    [id number]           Weight:       164.0 lb Date of Birth:  [birth year]           BSA:          1.798 m Patient Age:    67 years             BP:           105/63 mmHg Patient Gender: F                    HR:           80 bpm. Exam Location:  Inpatient Procedure: 2D Echo Indications:    chest pain  History:        Patient has prior history of Echocardiogram examinations, most                 recent 10/15/2015. Chronic kidney disease; Risk                 Factors:Hypertension and Dyslipidemia.  Sonographer:    Johny Chess RDCS Referring Phys: [phone] [place]  Sonographer Comments: Technically difficult study due to poor echo windows. IMPRESSIONS  1. Left ventricular ejection fraction, by estimation, is 50 to 55%. The left ventricle has low normal function. The left ventricle has no regional wall motion abnormalities. Left ventricular diastolic parameters are consistent with Grade I diastolic dysfunction (impaired relaxation).  2. Right ventricular systolic function is normal. The right ventricular  size is normal.  3. The mitral valve is normal in structure. No evidence of mitral valve regurgitation. No evidence of mitral stenosis.  4. The aortic valve has an indeterminant number of cusps. There is mild calcification of the aortic valve. There is mild thickening of the aortic valve. Aortic valve regurgitation is not visualized. No aortic stenosis is present.  5. The inferior vena cava is normal in size with greater than 50% respiratory variability, suggesting right atrial pressure of 3 mmHg. FINDINGS  Left Ventricle: Left ventricular ejection fraction, by estimation, is 50 to 55%. The left ventricle has low normal function. The left ventricle has no regional wall motion abnormalities. The left ventricular internal cavity size was normal in size. There is no left ventricular hypertrophy. Left ventricular diastolic parameters are consistent with Grade I diastolic dysfunction (impaired relaxation). Right Ventricle: The right ventricular size is normal. Right vetricular wall thickness was not well visualized. Right ventricular systolic function is normal. Left Atrium: Left atrial size was normal in size. Right Atrium: Right atrial size was normal in size. Pericardium: There is no evidence of pericardial effusion. Mitral Valve: The mitral valve is normal in structure. No evidence of mitral valve regurgitation. No evidence of mitral valve stenosis. Tricuspid Valve: The tricuspid valve is normal in structure. Tricuspid valve regurgitation is not demonstrated. No evidence of tricuspid stenosis. Aortic Valve: The aortic valve has an indeterminant number of cusps. There is mild calcification of the aortic valve. There is mild thickening of the aortic valve. There is mild aortic valve annular calcification. Aortic valve regurgitation is not visualized. No aortic stenosis is present. Pulmonic Valve: The pulmonic valve was not well visualized. Pulmonic valve regurgitation is not visualized. No evidence of pulmonic  stenosis. Aorta: The aortic root is normal in size and structure. Venous: The inferior vena cava is normal in size with greater than 50% respiratory  variability, suggesting right atrial pressure of 3 mmHg. IAS/Shunts: No atrial level shunt detected by color flow Doppler.  LEFT VENTRICLE PLAX 2D LVIDd:         3.80 cm   Diastology LVIDs:         2.80 cm   LV e' medial:    5.44 cm/s LV PW:         1.00 cm   LV E/e' medial:  11.9 LV IVS:        1.00 cm   LV e' lateral:   8.38 cm/s LVOT diam:     2.00 cm   LV E/e' lateral: 7.7 LV SV:         47 LV SV Index:   26 LVOT Area:     3.14 cm  IVC IVC diam: 1.10 cm LEFT ATRIUM             Index LA diam:        2.90 cm 1.61 cm/m LA Vol (A2C):   35.2 ml 19.58 ml/m LA Vol (A4C):   40.6 ml 22.58 ml/m LA Biplane Vol: 40.4 ml 22.47 ml/m  AORTIC VALVE LVOT Vmax:   91.70 cm/s LVOT Vmean:  60.800 cm/s LVOT VTI:    0.151 m  AORTA Ao Root diam: 2.80 cm Ao Asc diam:  3.00 cm MITRAL VALVE MV Area (PHT): 3.27 cm    SHUNTS MV Decel Time: 232 msec    Systemic VTI:  0.15 m MV E velocity: 64.70 cm/s  Systemic Diam: 2.00 cm MV A velocity: 97.30 cm/s MV E/A ratio:  0.66 Carlyle Dolly MD Electronically signed by Carlyle Dolly MD Signature Date/Time: 11/11/2022/2:16:41 PM    Final     Cardiac Studies     Patient Profile     Yvette Jenkins is a 67 y.o. female with a hx of HTN,HL, nonobstructrve CAD, who is being seen 11/11/2022 for the evaluation of chest pain at the request of [place]    1.Chest pain -extensive prior ischemic testing as documented above, most recently stress echo 2019 at [facility] was benign - notes describe a cath at [facility] 2017 with nonobstructive single vessel disease, I cannot find the full report on care everywhere. 07/2022 coronary calcium score 96th percentile.  - progressing symptoms of chest pain that by history are concerning for unstable angina. Left sided pressure increasing in frequency and severity over the last month improved  with SL NG.  - no objective evidence of ischemia by EKG or enzymes thus far - echo LVEF 50-55%, no WMAs, grade I dd - significant hypotension with SL NG in ER, would use prn morphine instead if needed.    Medical therapy with plavix 75 (ASA allergy), toprol 25. Statin intolerant - can hold on anticoag at this time, if trop elevation, EKG changes, or ongoing recurrent pain would start at that time.   - she is reluctant for contrast dye with her transplanted kidney understandably so. Will plan for nuclear stress test which would be available Monday, make npo tonight.    2. History of renal transplant - on immunosuppresants.   For questions or updates, please contact [place] Please consult [email or web address] for contact info under        Signed, Carlyle Dolly, MD  11/12/2022, 7:25 AM

## 2022-11-12 NOTE — Progress Notes (Signed)
PROGRESS NOTE    Yvette Jenkins  [id number] DOB: March 26, 1956 DOA: 11/10/2022 PCP: Harlan Stains, MD    Brief Narrative:  [name] is a 67 y.o. female with medical history significant of right renal cyst kidney, history of gross hematuria, history of Graves' disease, history of hypothyroidism, hypertension, nephrolithiasis of the left kidney, history of ESRD, history of renal transplant, history of secondary hyperparathyroidism, nonobstructive one-vessel CAD per cardiac cath done [facility] on 10/26/2015 who presented to the emergency [place] with pressure-like precordial chest pain radiated to her back associated with nausea and dyspnea that woke her up from sleep.  She has been having on and off chest pain, but not as intense.  She had a milder episode in the evening while she was sleeping.  No palpitations, diaphoresis, orthopnea or pitting edema of the lower extremities.  She was seen by Dr. Gwenlyn Found in August 2023 who sent her for a coronary calcium score.  This was done by Dr. Johnsie Cancel on 07/19/2022 and at that time it was recommended that she undergo a perfusion study.  Her maternal grandmother and mother have a history of CAD/MI.  No significant history of smoking.  She denied fever, chills, rhinorrhea, sore throat, wheezing or hemoptysis.  No abdominal pain, nausea, emesis, diarrhea, constipation, melena or hematochezia.  No flank pain, dysuria, frequency or hematuria.  No polyuria, polydipsia, polyphagia or blurred vision.      Assessment and Plan:   Chest pain Previous history of nonobstructive CAD. Perfusion study recommended after recent calcium score. Allergic to aspirin.- plavix started but patient is hesitant to take Echocardiogram pending -cards consulted- echo done-- plan for inpatient stress test in the aM     Hypothyroid/postablative hypothyroidism Continue levothyroxine 100 mcg p.o. daily. 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  (HCC) Continue mycophenolate and tacrolimus. Follow-up with nephrology and the transplant team as scheduled.     Atherosclerosis of aorta ([place])   Hyperlipidemia Would benefit from statin therapy but has a reaction to statins          DVT prophylaxis: enoxaparin (LOVENOX) injection 40 mg Start: 11/10/22 2200 SCDs Start: 11/10/22 1323    Code Status: Full Code   Disposition Plan:  Level of care: Telemetry Status is: Observation The patient remains OBS appropriate and will d/c before 2 midnights.    Consultants:  cards   Subjective: Awaiting stress test in the AM  Objective: Vitals:   11/11/22 1600 11/11/22 1912 11/11/22 1957 11/12/22 0517  BP:  (!) 129/95 (!) 134/90 100/62  Pulse:  72 68 60  Resp: '20 16 20 16  '$ Temp:  98.8 F (37.1 C) 98.1 F (36.7 C) 98.2 F (36.8 C)  TempSrc:  Oral Oral Oral  SpO2:  97% 98% 100%  Weight:      Height:        Intake/Output Summary (Last 24 hours) at 11/12/2022 1057 Last data filed at 11/12/2022 0558 Gross per 24 hour  Intake 660 ml  Output 2400 ml  Net -1740 ml   Filed Weights   11/10/22 0121 11/10/22 1207  Weight: 74.4 kg 74.4 kg    Examination:    General: Appearance:     Overweight female in no acute distress     Lungs:     respirations unlabored  Heart:    Normal heart rate. Normal rhythm. No murmurs, rubs, or gallops.   MS:   All extremities are intact.  Neurologic:   Awake, alert       Data Reviewed: I have personally reviewed following labs and imaging studies  CBC: Recent Labs  Lab 11/10/22 0128 11/11/22 0543  WBC 10.2 7.6  HGB 12.1 12.3  HCT 37.3 38.6  MCV 84.2 85.6  PLT 330 [id number]   Basic Metabolic Panel: Recent Labs  Lab 11/10/22 0128 11/11/22 0543  NA 140 134*  K 4.1 4.2  CL 108 105  CO2 22 23  GLUCOSE 83 97  BUN 23 18  CREATININE 1.40* 1.27*  CALCIUM 10.4* 9.4   GFR: Estimated Creatinine Clearance: 43.1 mL/min (A) (by  C-G formula based on SCr of 1.27 mg/dL (H)). Liver Function Tests: Recent Labs  Lab 11/11/22 0543  AST 14*  ALT 10  ALKPHOS 84  BILITOT 0.6  PROT 7.0  ALBUMIN 3.8   No results for input(s): "LIPASE", "AMYLASE" in the last 168 hours. No results for input(s): "AMMONIA" in the last 168 hours. Coagulation Profile: No results for input(s): "INR", "PROTIME" in the last 168 hours. Cardiac Enzymes: No results for input(s): "CKTOTAL", "CKMB", "CKMBINDEX", "TROPONINI" in the last 168 hours. BNP (last 3 results) No results for input(s): "PROBNP" in the last 8760 hours. HbA1C: No results for input(s): "HGBA1C" in the last 72 hours. CBG: No results for input(s): "GLUCAP" in the last 168 hours. Lipid Profile: No results for input(s): "CHOL", "HDL", "LDLCALC", "TRIG", "CHOLHDL", "LDLDIRECT" in the last 72 hours. Thyroid Function Tests: No results for input(s): "TSH", "T4TOTAL", "FREET4", "T3FREE", "THYROIDAB" in the last 72 hours. Anemia Panel: No results for input(s): "VITAMINB12", "FOLATE", "FERRITIN", "TIBC", "IRON", "RETICCTPCT" in the last 72 hours. Sepsis Labs: No results for input(s): "PROCALCITON", "LATICACIDVEN" in the last 168 hours.  No results found for this or any previous visit (from the past 240 hour(s)).       Radiology Studies: ECHOCARDIOGRAM COMPLETE  Result Date: 11/11/2022    ECHOCARDIOGRAM REPORT   Patient Name:   RUBITH CROTWELL Yvette Date of Exam: 11/11/2022 Medical Rec #:  [id number]            Height:       64.0 in Accession #:    [id number]           Weight:       164.0 lb Date of Birth:  25-Aug-1956           BSA:          1.798 m Patient Age:    26 years             BP:           105/63 mmHg Patient Gender: F                    HR:           80 bpm. Exam Location:  Inpatient Procedure: 2D Echo Indications:    chest pain  History:        Patient has prior history of Echocardiogram examinations, most                 recent 10/15/2015. Chronic kidney disease; Risk                  Factors:Hypertension and Dyslipidemia.  Sonographer:    Johny Chess RDCS Referring Phys: [phone] [place]  Sonographer Comments: Technically difficult study due to poor echo windows. IMPRESSIONS  1. Left ventricular ejection fraction, by estimation, is 50 to 55%. The left ventricle has  low normal function. The left ventricle has no regional wall motion abnormalities. Left ventricular diastolic parameters are consistent with Grade I diastolic dysfunction (impaired relaxation).  2. Right ventricular systolic function is normal. The right ventricular size is normal.  3. The mitral valve is normal in structure. No evidence of mitral valve regurgitation. No evidence of mitral stenosis.  4. The aortic valve has an indeterminant number of cusps. There is mild calcification of the aortic valve. There is mild thickening of the aortic valve. Aortic valve regurgitation is not visualized. No aortic stenosis is present.  5. The inferior vena cava is normal in size with greater than 50% respiratory variability, suggesting right atrial pressure of 3 mmHg. FINDINGS  Left Ventricle: Left ventricular ejection fraction, by estimation, is 50 to 55%. The left ventricle has low normal function. The left ventricle has no regional wall motion abnormalities. The left ventricular internal cavity size was normal in size. There is no left ventricular hypertrophy. Left ventricular diastolic parameters are consistent with Grade I diastolic dysfunction (impaired relaxation). Right Ventricle: The right ventricular size is normal. Right vetricular wall thickness was not well visualized. Right ventricular systolic function is normal. Left Atrium: Left atrial size was normal in size. Right Atrium: Right atrial size was normal in size. Pericardium: There is no evidence of pericardial effusion. Mitral Valve: The mitral valve is normal in structure. No evidence of mitral valve regurgitation. No evidence of mitral valve stenosis.  Tricuspid Valve: The tricuspid valve is normal in structure. Tricuspid valve regurgitation is not demonstrated. No evidence of tricuspid stenosis. Aortic Valve: The aortic valve has an indeterminant number of cusps. There is mild calcification of the aortic valve. There is mild thickening of the aortic valve. There is mild aortic valve annular calcification. Aortic valve regurgitation is not visualized. No aortic stenosis is present. Pulmonic Valve: The pulmonic valve was not well visualized. Pulmonic valve regurgitation is not visualized. No evidence of pulmonic stenosis. Aorta: The aortic root is normal in size and structure. Venous: The inferior vena cava is normal in size with greater than 50% respiratory variability, suggesting right atrial pressure of 3 mmHg. IAS/Shunts: No atrial level shunt detected by color flow Doppler.  LEFT VENTRICLE PLAX 2D LVIDd:         3.80 cm   Diastology LVIDs:         2.80 cm   LV e' medial:    5.44 cm/s LV PW:         1.00 cm   LV E/e' medial:  11.9 LV IVS:        1.00 cm   LV e' lateral:   8.38 cm/s LVOT diam:     2.00 cm   LV E/e' lateral: 7.7 LV SV:         47 LV SV Index:   26 LVOT Area:     3.14 cm  IVC IVC diam: 1.10 cm LEFT ATRIUM             Index LA diam:        2.90 cm 1.61 cm/m LA Vol (A2C):   35.2 ml 19.58 ml/m LA Vol (A4C):   40.6 ml 22.58 ml/m LA Biplane Vol: 40.4 ml 22.47 ml/m  AORTIC VALVE LVOT Vmax:   91.70 cm/s LVOT Vmean:  60.800 cm/s LVOT VTI:    0.151 m  AORTA Ao Root diam: 2.80 cm Ao Asc diam:  3.00 cm MITRAL VALVE MV Area (PHT): 3.27 cm    SHUNTS MV Decel  Time: 232 msec    Systemic VTI:  0.15 m MV E velocity: 64.70 cm/s  Systemic Diam: 2.00 cm MV A velocity: 97.30 cm/s MV E/A ratio:  0.66 Carlyle Dolly MD Electronically signed by Carlyle Dolly MD Signature Date/Time: 11/11/2022/2:16:41 PM    Final         Scheduled Meds:  amLODipine  5 mg Oral Daily   clopidogrel  75 mg Oral Daily   enoxaparin (LOVENOX) injection  40 mg Subcutaneous Q24H    levothyroxine  100 mcg Oral Q0600   metoprolol succinate  25 mg Oral Daily   mycophenolate  250 mg Oral BID   predniSONE  5 mg Oral Q breakfast   tacrolimus ER  2 mg Oral QAC breakfast   Continuous Infusions:   LOS: 1 day    Time spent: 45 min minutes spent on chart review, discussion with nursing staff, consultants, updating family and interview/physical exam; more than 50% of that time was spent in counseling and/or coordination of care.    Geradine Girt, DO [facility] Available via Epic secure chat 7am-7pm After these hours, please refer to coverage provider listed on [email or web address] 11/12/2022, 10:57 AM

## 2022-11-13 ENCOUNTER — Ambulatory Visit (HOSPITAL_BASED_OUTPATIENT_CLINIC_OR_DEPARTMENT_OTHER)
Admit: 2022-11-13 | Discharge: 2022-11-13 | Disposition: A | Discharge status: Home / Self Care | Payer: MEDICARE | Source: Ambulatory Visit | Attending: Cardiology | Admitting: Cardiology

## 2022-11-13 DIAGNOSIS — R079 Chest pain, unspecified: Secondary | ICD-10-CM | POA: Diagnosis not present

## 2022-11-13 DIAGNOSIS — I200 Unstable angina: Secondary | ICD-10-CM | POA: Diagnosis not present

## 2022-11-13 LAB — NM MYOCAR MULTI W/SPECT W/WALL MOTION / EF
Estimated workload: 1
MPHR: 154 {beats}/min
Peak HR: 96 {beats}/min
Percent HR: 62 %
Rest HR: 56 {beats}/min
ST Depression (mm): 0 mm

## 2022-11-13 MED ORDER — TECHNETIUM TC 99M TETROFOSMIN IV KIT
9.1000 | PACK | Freq: Once | INTRAVENOUS | Status: AC | PRN
  Administered 2022-11-13: 9.1 via INTRAVENOUS

## 2022-11-13 MED ORDER — METOPROLOL SUCCINATE ER 25 MG PO TB24: 12.5000 mg | ORAL_TABLET | Freq: Every day | ORAL | Status: DC

## 2022-11-13 MED ORDER — METOPROLOL SUCCINATE ER 25 MG PO TB24: 12.5000 mg | ORAL_TABLET | Freq: Every day | ORAL | 0 refills | Status: DC

## 2022-11-13 MED ORDER — REGADENOSON 0.4 MG/5ML IV SOLN
INTRAVENOUS | Status: AC
  Filled 2022-11-13: qty 5

## 2022-11-13 MED ORDER — ACETAMINOPHEN 325 MG PO TABS
ORAL_TABLET | ORAL | Status: AC
  Filled 2022-11-13: qty 2

## 2022-11-13 MED ORDER — REGADENOSON 0.4 MG/5ML IV SOLN
0.4000 mg | Freq: Once | INTRAVENOUS | Status: AC
  Administered 2022-11-13: 0.4 mg via INTRAVENOUS

## 2022-11-13 MED ORDER — TECHNETIUM TC 99M TETROFOSMIN IV KIT
31.0000 | PACK | Freq: Once | INTRAVENOUS | Status: AC | PRN
  Administered 2022-11-13: 31 via INTRAVENOUS

## 2022-11-13 NOTE — Progress Notes (Addendum)
Patient underwent 1 day lexiscan myoview without significant complication. Had chest pain, flushing and headache during the test. Given 650 mg tylenol after the test for headache and coke. Chest discomfort and headache improving. BP and HR stable. Pending final result by [facility] reader

## 2022-11-13 NOTE — Plan of Care (Signed)

## 2022-11-13 NOTE — Progress Notes (Signed)
PROGRESS NOTE    Yvette Jenkins  [id number] DOB: [birth year] DOA: 11/10/2022 PCP: Harlan Stains, MD    Brief Narrative:  Yvette Jenkins is a 67 y.o. female with medical history significant of right renal cyst kidney, history of gross hematuria, history of Graves' disease, history of hypothyroidism, hypertension, nephrolithiasis of the left kidney, history of ESRD, history of renal transplant, history of secondary hyperparathyroidism, nonobstructive one-vessel CAD per cardiac cath done [facility] on 10/26/2015 who presented to the emergency [place] with pressure-like precordial chest pain radiated to her back associated with nausea and dyspnea that woke her up from sleep.  She has been having on and off chest pain, but not as intense.  She had a milder episode in the evening while she was sleeping.  No palpitations, diaphoresis, orthopnea or pitting edema of the lower extremities.  She was seen by Dr. Gwenlyn Found in August 2023 who sent her for a coronary calcium score.  This was done by Dr. Johnsie Cancel on 07/19/2022 and at that time it was recommended that she undergo a perfusion study.  Her maternal grandmother and mother have a history of CAD/MI.  No significant history of smoking.  She denied fever, chills, rhinorrhea, sore throat, wheezing or hemoptysis.  No abdominal pain, nausea, emesis, diarrhea, constipation, melena or hematochezia.  No flank pain, dysuria, frequency or hematuria.  No polyuria, polydipsia, polyphagia or blurred vision.      Assessment and Plan:   Chest pain Previous history of nonobstructive CAD. Perfusion study recommended after recent calcium score. Allergic to aspirin.- plavix started but patient is hesitant to take Echocardiogram pending -cards consulted- echo done-- plan for inpatient stress test today     Hypothyroid/postablative hypothyroidism Continue levothyroxine 100 mcg p.o. daily. 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  (HCC) Continue mycophenolate and tacrolimus. Follow-up with nephrology and the transplant team as scheduled.     Atherosclerosis of aorta ([place])   Hyperlipidemia Would benefit from statin therapy but has a reaction to statins Outpatient follow up          DVT prophylaxis: enoxaparin (LOVENOX) injection 40 mg Start: 11/10/22 2200 SCDs Start: 11/10/22 1323    Code Status: Full Code   Disposition Plan:  Level of care: Telemetry Status is: inpt    Consultants:  cards   Subjective: Awaiting stress test at [facility]  Objective: Vitals:   11/12/22 0517 11/12/22 1253 11/12/22 2003 11/13/22 0452  BP: 100/62 111/62 133/74 113/75  Pulse: 60 60 64 (!) 58  Resp: '16 16 19 18  '$ Temp: 98.2 F (36.8 C) 97.9 F (36.6 C) 98.1 F (36.7 C) 98.2 F (36.8 C)  TempSrc: Oral Oral Oral Oral  SpO2: 100% 100% 100% 98%  Weight:      Height:        Intake/Output Summary (Last 24 hours) at 11/13/2022 1017 Last data filed at 11/13/2022 0500 Gross per 24 hour  Intake 1080 ml  Output 1600 ml  Net -520 ml   Filed Weights   11/10/22 0121 11/10/22 1207  Weight: 74.4 kg 74.4 kg    Examination:    General: Appearance:     Overweight female in no acute distress     Lungs:     respirations unlabored  Heart:    Bradycardic. Normal rhythm. No murmurs, rubs, or gallops.   MS:   All extremities are intact.   Neurologic:   Awake, alert  Data Reviewed: I have personally reviewed following labs and imaging studies  CBC: Recent Labs  Lab 11/10/22 0128 11/11/22 0543  WBC 10.2 7.6  HGB 12.1 12.3  HCT 37.3 38.6  MCV 84.2 85.6  PLT 330 [id number]   Basic Metabolic Panel: Recent Labs  Lab 11/10/22 0128 11/11/22 0543  NA 140 134*  K 4.1 4.2  CL 108 105  CO2 22 23  GLUCOSE 83 97  BUN 23 18  CREATININE 1.40* 1.27*  CALCIUM 10.4* 9.4   GFR: Estimated Creatinine Clearance: 43.1 mL/min (A) (by C-G formula based on SCr of 1.27 mg/dL  (H)). Liver Function Tests: Recent Labs  Lab 11/11/22 0543  AST 14*  ALT 10  ALKPHOS 84  BILITOT 0.6  PROT 7.0  ALBUMIN 3.8   No results for input(s): "LIPASE", "AMYLASE" in the last 168 hours. No results for input(s): "AMMONIA" in the last 168 hours. Coagulation Profile: No results for input(s): "INR", "PROTIME" in the last 168 hours. Cardiac Enzymes: No results for input(s): "CKTOTAL", "CKMB", "CKMBINDEX", "TROPONINI" in the last 168 hours. BNP (last 3 results) No results for input(s): "PROBNP" in the last 8760 hours. HbA1C: No results for input(s): "HGBA1C" in the last 72 hours. CBG: No results for input(s): "GLUCAP" in the last 168 hours. Lipid Profile: No results for input(s): "CHOL", "HDL", "LDLCALC", "TRIG", "CHOLHDL", "LDLDIRECT" in the last 72 hours. Thyroid Function Tests: No results for input(s): "TSH", "T4TOTAL", "FREET4", "T3FREE", "THYROIDAB" in the last 72 hours. Anemia Panel: No results for input(s): "VITAMINB12", "FOLATE", "FERRITIN", "TIBC", "IRON", "RETICCTPCT" in the last 72 hours. Sepsis Labs: No results for input(s): "PROCALCITON", "LATICACIDVEN" in the last 168 hours.  No results found for this or any previous visit (from the past 240 hour(s)).       Radiology Studies: No results found.      Scheduled Meds:  amLODipine  5 mg Oral Daily   clopidogrel  75 mg Oral Daily   enoxaparin (LOVENOX) injection  40 mg Subcutaneous Q24H   levothyroxine  100 mcg Oral Q0600   metoprolol succinate  25 mg Oral Daily   mycophenolate  250 mg Oral BID   predniSONE  5 mg Oral Q breakfast   tacrolimus ER  2 mg Oral QAC breakfast   Continuous Infusions:   LOS: 2 days    Time spent: 45 min minutes spent on chart review, discussion with nursing staff, consultants, updating family and interview/physical exam; more than 50% of that time was spent in counseling and/or coordination of care.    Geradine Girt, DO [facility] Available via Epic secure  chat 7am-7pm After these hours, please refer to coverage provider listed on [email or web address] 11/13/2022, 10:17 AM

## 2022-11-13 NOTE — Progress Notes (Signed)
   11/13/22 1417  Vitals  Temp 97.9 F (36.6 C)  Temp Source Oral  BP 136/72  MAP (mmHg) 92  BP Location Right Arm  BP Method Automatic  Patient Position (if appropriate) Lying  Pulse Rate (!) 52  Pulse Rate Source Monitor  Resp 18  Oxygen Therapy  SpO2 100 %  O2 Device Room Air  MEWS Score  MEWS Temp 0  MEWS Systolic 0  MEWS Pulse 0  MEWS RR 0  MEWS LOC 0  MEWS Score 0  MEWS Score Color Green     Patient arrived to the unit, comfort afforded. VSS, denies any pain.

## 2022-11-13 NOTE — Progress Notes (Signed)
AVS instructions reviewed with patient, all questions answered.   All personal belongings returned.    Patient going home via personal car, husband is driving.

## 2022-11-13 NOTE — Progress Notes (Signed)
   I called the patient to discuss results of her nuclear stress test. Patient was happy to hear that the study was normal, low risk. Patient reports that she has not been taking plavix because she is concerned about side effects. Discussed that plavix is an anti-platelet medication, and helps to prevent blockages in the blood vessels around her heart. Patient is worried that this medication may harm her transplanted kidney, and would like to discuss at a further time. She does have an outpatient appointment on 4/3.   Patient is concerned that metoprolol is causing her to have too low of a blood pressure. Last recorded BP 136/72. Patient is willing to try a lower dose of metoprolol, and will be sent home on metoprolol succinate 12.5 mg daily. Explained that she is also on amlodipine, which can help with chest pain/is anti-anginal as well.   Margie Billet, PA-C 11/13/2022 3:27 PM

## 2022-11-13 NOTE — Discharge Summary (Signed)
Physician Discharge Summary  Yvette Jenkins [id number] DOB: 1956-03-18 DOA: 11/10/2022  PCP: Harlan Stains, MD  Admit date: 11/10/2022 Discharge date: 11/13/2022  Admitted From: home Discharge disposition: home   Recommendations for Outpatient Follow-Up:   Titration of BP meds Patient to consider plavix as an outpatient at the next office visit TSH outpatient    Discharge Diagnosis:   Principal Problem:   Chest pain Active Problems:   Hypothyroid   Stage 3 chronic kidney disease ([place])   Atherosclerosis of aorta ([place])   Postablative hypothyroidism   Hyperlipidemia   Overweight with body mass index (BMI) 25.0-29.9   Angina at rest    Discharge Condition: Improved.  Diet recommendation: Low sodium, heart healthy.  Wound care: None.  Code status: Full.   History of Present Illness:   Yvette Jenkins is a 67 y.o. female with medical history significant of right renal cyst kidney, history of gross hematuria, history of Graves' disease, history of hypothyroidism, hypertension, nephrolithiasis of the left kidney, history of ESRD, history of renal transplant, history of secondary hyperparathyroidism, nonobstructive one-vessel CAD per cardiac cath done [facility] on 10/26/2015 who presented to the emergency [place] with pressure-like precordial chest pain radiated to her back associated with nausea and dyspnea that woke her up from sleep.  She has been having on and off chest pain, but not as intense.  She had a milder episode in the evening while she was sleeping.  No palpitations, diaphoresis, orthopnea or pitting edema of the lower extremities.  She was seen by Dr. Gwenlyn Found in August 2023 who sent her for a coronary calcium score.  This was done by Dr. Johnsie Cancel on 07/19/2022 and at that time it was recommended that she undergo a perfusion study.  Her maternal grandmother and mother have a history of CAD/MI.  No  significant history of smoking.  She denied fever, chills, rhinorrhea, sore throat, wheezing or hemoptysis.  No abdominal pain, nausea, emesis, diarrhea, constipation, melena or hematochezia.  No flank pain, dysuria, frequency or hematuria.  No polyuria, polydipsia, polyphagia or blurred vision.    Hospital Course by Problem:   Chest pain Previous history of nonobstructive CAD. Perfusion study recommended after recent calcium score. Allergic to aspirin.- plavix started but patient is hesitant to take Echocardiogram done -cards consulted- echo done-low risk stress test     Hypothyroid/postablative hypothyroidism Continue levothyroxine 100 mcg p.o. daily. -outpatient TSH     Stage 3b chronic kidney disease of transplanted kidney.  (HCC) Continue mycophenolate and tacrolimus. Follow-up with nephrology and the transplant team as scheduled.     Atherosclerosis of aorta ([place])   Hyperlipidemia Would benefit from statin therapy but has a reaction to statins Outpatient follow up    Medical Consultants:   cards   Discharge Exam:   Vitals:   11/13/22 1206 11/13/22 1417  BP: 124/70 136/72  Pulse: 80 (!) 52  Resp:  18  Temp:  97.9 F (36.6 C)  SpO2:  100%   Vitals:   11/13/22 1202 11/13/22 1205 11/13/22 1206 11/13/22 1417  BP: 128/62 116/69 124/70 136/72  Pulse: 89 81 80 (!) 52  Resp:    18  Temp:    97.9 F (36.6 C)  TempSrc:    Oral  SpO2:    100%  Weight:      Height:        General exam: Appears calm and comfortable.    The results of significant diagnostics  from this hospitalization (including imaging, microbiology, ancillary and laboratory) are listed below for reference.     Procedures and Diagnostic Studies:   NM Myocar Multi W/Spect W/Wall Motion / EF  Result Date: 11/13/2022   The study is normal. The study is low risk.   No ST deviation was noted.   Left ventricular function is normal. The left ventricular ejection fraction is normal (55-65%). End  diastolic cavity size is normal.     Labs:   Basic Metabolic Panel: Recent Labs  Lab 11/10/22 0128 11/11/22 0543  NA 140 134*  K 4.1 4.2  CL 108 105  CO2 22 23  GLUCOSE 83 97  BUN 23 18  CREATININE 1.40* 1.27*  CALCIUM 10.4* 9.4   GFR Estimated Creatinine Clearance: 43.1 mL/min (A) (by C-G formula based on SCr of 1.27 mg/dL (H)). Liver Function Tests: Recent Labs  Lab 11/11/22 0543  AST 14*  ALT 10  ALKPHOS 84  BILITOT 0.6  PROT 7.0  ALBUMIN 3.8   No results for input(s): "LIPASE", "AMYLASE" in the last 168 hours. No results for input(s): "AMMONIA" in the last 168 hours. Coagulation profile No results for input(s): "INR", "PROTIME" in the last 168 hours.  CBC: Recent Labs  Lab 11/10/22 0128 11/11/22 0543  WBC 10.2 7.6  HGB 12.1 12.3  HCT 37.3 38.6  MCV 84.2 85.6  PLT 330 308   Cardiac Enzymes: No results for input(s): "CKTOTAL", "CKMB", "CKMBINDEX", "TROPONINI" in the last 168 hours. BNP: Invalid input(s): "POCBNP" CBG: No results for input(s): "GLUCAP" in the last 168 hours. D-Dimer No results for input(s): "DDIMER" in the last 72 hours. Hgb A1c No results for input(s): "HGBA1C" in the last 72 hours. Lipid Profile No results for input(s): "CHOL", "HDL", "LDLCALC", "TRIG", "CHOLHDL", "LDLDIRECT" in the last 72 hours. Thyroid function studies No results for input(s): "TSH", "T4TOTAL", "T3FREE", "THYROIDAB" in the last 72 hours.  Invalid input(s): "FREET3" Anemia work up No results for input(s): "VITAMINB12", "FOLATE", "FERRITIN", "TIBC", "IRON", "RETICCTPCT" in the last 72 hours. Microbiology No results found for this or any previous visit (from the past 240 hour(s)).   Discharge Instructions:   Discharge Instructions     Diet - low sodium heart healthy   Complete by: As directed    Discharge instructions   Complete by: As directed    Discuss plavix at next cardiology office visit   Increase activity slowly   Complete by: As directed        Allergies as of 11/13/2022       Reactions   Amoxicillin-pot Clavulanate Nausea And Vomiting   Other    S/P Renal Transplant   Aspirin Hives   Tetracyclines & Related Hives, Nausea Only, Other (See Comments)   GI intolerance -"Messes with stomach"   Statins Other (See Comments)   Leg pain         Medication List     STOP taking these medications    BIOTIN PO   Breo Ellipta 100-25 MCG/ACT Aepb Generic drug: fluticasone furoate-vilanterol   lidocaine 5 % Commonly known as: Lidoderm   Prenatal (w/Iron & FA) 27-0.8 MG Tabs   Systane Balance 0.6 % Soln Generic drug: Propylene Glycol       TAKE these medications    acetaminophen 650 MG CR tablet Commonly known as: TYLENOL Take 1,300 mg by mouth every 8 (eight) hours as needed for pain.   amLODipine 5 MG tablet Commonly known as: NORVASC Take 5 mg by mouth daily.  Envarsus XR 1 MG Tb24 Generic drug: tacrolimus ER Take 2 mg by mouth daily.   levothyroxine 100 MCG tablet Commonly known as: SYNTHROID Take 100 mcg by mouth daily before breakfast.   MAGNESIUM PO Take 1 tablet by mouth 2 (two) times a week.   metoprolol succinate 25 MG 24 hr tablet Commonly known as: TOPROL-XL Take 0.5 tablets (12.5 mg total) by mouth daily. Hold SBP <100 or pulse < 60 Start taking on: November 14, 2022   mycophenolate 250 MG capsule Commonly known as: CELLCEPT Take 250 mg by mouth 2 (two) times daily.   predniSONE 5 MG tablet Commonly known as: DELTASONE Take 5 mg by mouth daily with breakfast.   VITAMIN B-12 PO Take 1 capsule by mouth 2 (two) times a week.   VITAMIN D PO Take 1 capsule by mouth 2 (two) times a week.   VITAMIN K2 PO Take 1 tablet by mouth 2 (two) times a week.        Follow-up Information     Harlan Stains, MD Follow up in 1 week(s).   Specialty: Family Medicine Contact information: [place] [address] [phone]         Lenna Sciara, NP  Follow up.   Specialties: Cardiology, Family Medicine Why: april 3 at 8:325 AM Contact information: [address] [place] [address] [phone]                  Time coordinating discharge: 45 min  Signed:  Geradine Girt DO  [facility] 11/13/2022, 3:21 PM

## 2022-11-13 NOTE — Progress Notes (Addendum)
Rounding Note    Patient Name: Yvette Jenkins Date of Encounter: 11/13/2022  [place] Cardiologist: None   Subjective   Patient has continued to have intermittent chest pain since coming to the hospital and has had about 4 episodes total. Pain occurs at rest, feels like a pressure in her chest. She has had this type of chest pain before, and has had multiple ischemic evaluations in the past. Breathing is OK when she is at rest, but she does get a little short of breath on exertion.   Inpatient Medications    Scheduled Meds:  amLODipine  5 mg Oral Daily   clopidogrel  75 mg Oral Daily   enoxaparin (LOVENOX) injection  40 mg Subcutaneous Q24H   levothyroxine  100 mcg Oral Q0600   metoprolol succinate  25 mg Oral Daily   mycophenolate  250 mg Oral BID   predniSONE  5 mg Oral Q breakfast   tacrolimus ER  2 mg Oral QAC breakfast   Continuous Infusions:  PRN Meds: acetaminophen **OR** acetaminophen, morphine injection, ondansetron **OR** ondansetron (ZOFRAN) IV   Vital Signs    Vitals:   11/12/22 0517 11/12/22 1253 11/12/22 2003 11/13/22 0452  BP: 100/62 111/62 133/74 113/75  Pulse: 60 60 64 (!) 58  Resp: '16 16 19 18  '$ Temp: 98.2 F (36.8 C) 97.9 F (36.6 C) 98.1 F (36.7 C) 98.2 F (36.8 C)  TempSrc: Oral Oral Oral Oral  SpO2: 100% 100% 100% 98%  Weight:      Height:        Intake/Output Summary (Last 24 hours) at 11/13/2022 0754 Last data filed at 11/13/2022 0500 Gross per 24 hour  Intake 1320 ml  Output 1600 ml  Net -280 ml      11/10/2022   12:07 PM 11/10/2022    1:21 AM 04/18/2022    1:57 PM  Last 3 Weights  Weight (lbs) 164 lb 164 lb 167 lb 9.6 oz  Weight (kg) 74.39 kg 74.39 kg 76.023 kg      Telemetry    NSR - Personally Reviewed  ECG    No new tracings since 3/9 - Personally Reviewed  Physical Exam   GEN: No acute distress.  Laying flat in the bed, asleep but aroused easily to voice  Neck: No JVD Cardiac: RRR, no murmurs,  rubs, or gallops. Radial pulses 2+ bilaterally Respiratory: Clear to auscultation bilaterally. Normal WOB on room air  GI: Soft, nontender, non-distended  MS: No edema in BLE; No deformity. Neuro:  Nonfocal  Psych: Normal affect   Labs    High Sensitivity Troponin:   Recent Labs  Lab 11/10/22 0128 11/10/22 0330  TROPONINIHS 4 5     Chemistry Recent Labs  Lab 11/10/22 0128 11/11/22 0543  NA 140 134*  K 4.1 4.2  CL 108 105  CO2 22 23  GLUCOSE 83 97  BUN 23 18  CREATININE 1.40* 1.27*  CALCIUM 10.4* 9.4  PROT  --  7.0  ALBUMIN  --  3.8  AST  --  14*  ALT  --  10  ALKPHOS  --  84  BILITOT  --  0.6  GFRNONAA 41* 47*  ANIONGAP 10 6    Lipids No results for input(s): "CHOL", "TRIG", "HDL", "LABVLDL", "LDLCALC", "CHOLHDL" in the last 168 hours.  Hematology Recent Labs  Lab 11/10/22 0128 11/11/22 0543  WBC 10.2 7.6  RBC 4.43 4.51  HGB 12.1 12.3  HCT 37.3 38.6  MCV 84.2  85.6  MCH 27.3 27.3  MCHC 32.4 31.9  RDW 14.7 14.8  PLT 330 308   Thyroid No results for input(s): "TSH", "FREET4" in the last 168 hours.  BNPNo results for input(s): "BNP", "PROBNP" in the last 168 hours.  DDimer No results for input(s): "DDIMER" in the last 168 hours.   Radiology    ECHOCARDIOGRAM COMPLETE  Result Date: 11/11/2022    ECHOCARDIOGRAM REPORT   Patient Name:   Yvette Jenkins Date of Exam: 11/11/2022 Medical Rec #:  [id number]            Height:       64.0 in Accession #:    [id number]           Weight:       164.0 lb Date of Birth:  19-Mar-1956           BSA:          1.798 m Patient Age:    67 years             BP:           105/63 mmHg Patient Gender: F                    HR:           80 bpm. Exam Location:  Inpatient Procedure: 2D Echo Indications:    chest pain  History:        Patient has prior history of Echocardiogram examinations, most                 recent 10/15/2015. Chronic kidney disease; Risk                 Factors:Hypertension and Dyslipidemia.  Sonographer:    Johny Chess RDCS Referring Phys: [phone] [place]  Sonographer Comments: Technically difficult study due to poor echo windows. IMPRESSIONS  1. Left ventricular ejection fraction, by estimation, is 50 to 55%. The left ventricle has low normal function. The left ventricle has no regional wall motion abnormalities. Left ventricular diastolic parameters are consistent with Grade I diastolic dysfunction (impaired relaxation).  2. Right ventricular systolic function is normal. The right ventricular size is normal.  3. The mitral valve is normal in structure. No evidence of mitral valve regurgitation. No evidence of mitral stenosis.  4. The aortic valve has an indeterminant number of cusps. There is mild calcification of the aortic valve. There is mild thickening of the aortic valve. Aortic valve regurgitation is not visualized. No aortic stenosis is present.  5. The inferior vena cava is normal in size with greater than 50% respiratory variability, suggesting right atrial pressure of 3 mmHg. FINDINGS  Left Ventricle: Left ventricular ejection fraction, by estimation, is 50 to 55%. The left ventricle has low normal function. The left ventricle has no regional wall motion abnormalities. The left ventricular internal cavity size was normal in size. There is no left ventricular hypertrophy. Left ventricular diastolic parameters are consistent with Grade I diastolic dysfunction (impaired relaxation). Right Ventricle: The right ventricular size is normal. Right vetricular wall thickness was not well visualized. Right ventricular systolic function is normal. Left Atrium: Left atrial size was normal in size. Right Atrium: Right atrial size was normal in size. Pericardium: There is no evidence of pericardial effusion. Mitral Valve: The mitral valve is normal in structure. No evidence of mitral valve regurgitation. No evidence of mitral valve stenosis. Tricuspid Valve: The tricuspid valve is normal in structure.  Tricuspid  valve regurgitation is not demonstrated. No evidence of tricuspid stenosis. Aortic Valve: The aortic valve has an indeterminant number of cusps. There is mild calcification of the aortic valve. There is mild thickening of the aortic valve. There is mild aortic valve annular calcification. Aortic valve regurgitation is not visualized. No aortic stenosis is present. Pulmonic Valve: The pulmonic valve was not well visualized. Pulmonic valve regurgitation is not visualized. No evidence of pulmonic stenosis. Aorta: The aortic root is normal in size and structure. Venous: The inferior vena cava is normal in size with greater than 50% respiratory variability, suggesting right atrial pressure of 3 mmHg. IAS/Shunts: No atrial level shunt detected by color flow Doppler.  LEFT VENTRICLE PLAX 2D LVIDd:         3.80 cm   Diastology LVIDs:         2.80 cm   LV e' medial:    5.44 cm/s LV PW:         1.00 cm   LV E/e' medial:  11.9 LV IVS:        1.00 cm   LV e' lateral:   8.38 cm/s LVOT diam:     2.00 cm   LV E/e' lateral: 7.7 LV SV:         47 LV SV Index:   26 LVOT Area:     3.14 cm  IVC IVC diam: 1.10 cm LEFT ATRIUM             Index LA diam:        2.90 cm 1.61 cm/m LA Vol (A2C):   35.2 ml 19.58 ml/m LA Vol (A4C):   40.6 ml 22.58 ml/m LA Biplane Vol: 40.4 ml 22.47 ml/m  AORTIC VALVE LVOT Vmax:   91.70 cm/s LVOT Vmean:  60.800 cm/s LVOT VTI:    0.151 m  AORTA Ao Root diam: 2.80 cm Ao Asc diam:  3.00 cm MITRAL VALVE MV Area (PHT): 3.27 cm    SHUNTS MV Decel Time: 232 msec    Systemic VTI:  0.15 m MV E velocity: 64.70 cm/s  Systemic Diam: 2.00 cm MV A velocity: 97.30 cm/s MV E/A ratio:  0.66 Carlyle Dolly MD Electronically signed by Carlyle Dolly MD Signature Date/Time: 11/11/2022/2:16:41 PM    Final     Cardiac Studies   Echocardiogram 11/11/22  1. Left ventricular ejection fraction, by estimation, is 50 to 55%. The  left ventricle has low normal function. The left ventricle has no regional  wall motion  abnormalities. Left ventricular diastolic parameters are  consistent with Grade I diastolic  dysfunction (impaired relaxation).   2. Right ventricular systolic function is normal. The right ventricular  size is normal.   3. The mitral valve is normal in structure. No evidence of mitral valve  regurgitation. No evidence of mitral stenosis.   4. The aortic valve has an indeterminant number of cusps. There is mild  calcification of the aortic valve. There is mild thickening of the aortic  valve. Aortic valve regurgitation is not visualized. No aortic stenosis is  present.   5. The inferior vena cava is normal in size with greater than 50%  respiratory variability, suggesting right atrial pressure of 3 mmHg.   Patient Profile     67 y.o. female with a hx of HTN,HL, nonobstructrve CAD, right renal cyst kidney, Graves disease, history of hypothyroidism, history of ESRD s/p renal transplant, history of secondary hyperparathyroidism. Patient is being seen for the evaluation of chest pain  Assessment & Plan    Chest Pain  History of nonobstructive CAD  - Patient previously had a cardiac catheterization at [facility] in 2017 that showed single vessel nonobstructive disease (care everywhere does not have the complete report). She later had a stress echo in 2019 at [facility] that was normal  - She had CT cardiac scoring in 07/2022 that showed a coronary calcium score of 529 (96th percentile). Recommended consideration of perfusion study  - Patient presented to [facility] ED complaining of left sided chest pressure. First episode occurred about 1 month ago, came on with activity. Had a second episode last week, and 2 additional episodes on the day of presentation  - hsTn 4>5 - Echocardiogram this admission showed EF 50-55%, no regional wall motion abnormalities, grade I diastolic dysfunction, normal RV systolic function  - Given patient's history of elevated coronary calcium scoring and development of left sided chest  pressure, would recommend ischemic evaluation.  - Considered coronary CT, but patient is concerned about receiving contrast dye with her transplanted kidney. Instead, she is scheduled for a nuclear stress test today  - Continue medical therapy with plavix (allergic to ASA), toprol-XL 25 mg daily  - Patient is intolerant to statins - Continue amlodipine 5 mg daily  HLD  - Patient is intolerant of statins (developed leg pain). 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  Has had prior episodes of chest pain, troponins are normal, EKG without ischemic changes.  Echocardiogram with a EF of 50 to 55%, prior renal transplant.  Over at [facility] currently getting a nuclear stress test prior cardiac catheterization in 2017 showed single-vessel nonobstructive disease.  Coronary calcium score in November 2023 showed calcium score 529.  Perfusion study was recommended.  She has been intolerant to statins.  Prior LDL 157.  I would like for her to consider PCSK9 inhibitor.  Awaiting results of stress test.  If overall low risk, okay for discharge.  Candee Furbish, MD

## 2022-11-15 ENCOUNTER — Telehealth: Payer: Self-pay | Admitting: Cardiovascular Disease

## 2022-11-15 NOTE — Telephone Encounter (Signed)
Called patient, LVM to call back to discuss concerns.   Left call back number.

## 2022-11-15 NOTE — Telephone Encounter (Signed)
Pt called in frustrated and confused about some of her test results and overall findings from when she saw Dr. Gwenlyn Found months ago and her recent hospital visit. She is requesting to speak  to RN or Dr. Gwenlyn Found

## 2022-11-15 NOTE — Telephone Encounter (Signed)
Left message for the patient to call the clinic.

## 2022-11-17 NOTE — Telephone Encounter (Signed)
Patient ask regarding her echo and stress test.  She never received results and would like to know what they are.  She is upset because she went to the ER for chest pain and was admitted for 5 days.  She was told she had "narrowing of the coronary artery" and wants to know why no one from our office told her. She is upset that this wasn't conveyed to her.  I advised I would have the provider look at results of test and we can contact her with results. This may have been a new diagnosis from hospital but will defer to the provider for answers.  She does states she is not taking the metoprolol as prescribed after the hospital visit as "delayed at pharmacy and I haven't picked it up yet".  She is still taking the Amlodipine 5mg  daily.  She also ask why she isn't seeing Dr. Gwenlyn Found and has an appt with a NP.  I explained that the doctors schedules are full but that the NP is very qualified and Dr Gwenlyn Found will be aware of her care and OV visits. She states understanding

## 2022-11-17 NOTE — Telephone Encounter (Signed)
Call to patient LVM to call on Monday.  Did note that "no worries" concerning her questions but could give no further information till we speak.  Did not want her to worry over the weekend.

## 2022-11-20 NOTE — Telephone Encounter (Signed)
Yvette Harp, MD  Cv Div Nl Triage3 days ago    Her 2D echo was normal.  Her coronary calcium score done in November was 529.  Her recent Myoview stress test was normal as well.  There is no evidence that she has obstructive CAD.   Left message for pt to call back.

## 2022-12-06 ENCOUNTER — Encounter: Payer: Self-pay | Admitting: Nurse Practitioner

## 2022-12-06 ENCOUNTER — Ambulatory Visit: Payer: MEDICARE | Attending: Nurse Practitioner | Admitting: Nurse Practitioner

## 2022-12-06 VITALS — BP 112/74 | HR 78 | Ht 64.0 in | Wt 165.4 lb

## 2022-12-06 DIAGNOSIS — I25118 Atherosclerotic heart disease of native coronary artery with other forms of angina pectoris: Secondary | ICD-10-CM

## 2022-12-06 DIAGNOSIS — Z940 Kidney transplant status: Secondary | ICD-10-CM | POA: Diagnosis not present

## 2022-12-06 DIAGNOSIS — N1832 Chronic kidney disease, stage 3b: Secondary | ICD-10-CM | POA: Diagnosis not present

## 2022-12-06 DIAGNOSIS — I739 Peripheral vascular disease, unspecified: Secondary | ICD-10-CM | POA: Diagnosis not present

## 2022-12-06 DIAGNOSIS — I10 Essential (primary) hypertension: Secondary | ICD-10-CM

## 2022-12-06 DIAGNOSIS — E039 Hypothyroidism, unspecified: Secondary | ICD-10-CM | POA: Diagnosis not present

## 2022-12-06 DIAGNOSIS — E785 Hyperlipidemia, unspecified: Secondary | ICD-10-CM | POA: Diagnosis not present

## 2022-12-06 MED ORDER — NITROGLYCERIN 0.4 MG SL SUBL: 0.4000 mg | SUBLINGUAL_TABLET | SUBLINGUAL | 3 refills | Status: AC | PRN

## 2022-12-06 NOTE — Progress Notes (Signed)
Office Visit    Patient Name: Yvette Jenkins Date of Encounter: 12/06/2022  Primary Care Provider:  Harlan Stains, MD Primary Cardiologist:  Quay Burow, MD  Chief Complaint    67 year old female with a history of nonobstructive CAD, hypertension, hyperlipidemia, right renal cyst, hypothyroidism, Graves' disease, ESRD s/p renal transplant, and secondary hyperparathyroidism who presents for hospital follow-up related to CAD and chest pain.  Past Medical History    Past Medical History:  Diagnosis Date   Acquired renal cyst of right kidney    Anemia associated with chronic renal failure    IV Iron infusion    Arthritis    Coronary artery disease    per pt cardiac cath at [facility] 10-26-2015  non-obstructive cad of one vessel   End-stage renal disease on peritoneal dialysis    nephrologist-  dr Justin Mend [facility])  daily dialysis start 7-8pm to 7-8am-- currently on kidney transplant list at [facility]   Gross hematuria    History of Graves' disease    Hypertension    Hypothyroidism, postradioiodine therapy    Kidney transplant candidate    on waiting list at [facility]   Left nephrolithiasis    non-obstructive per pt    PONV (postoperative nausea and vomiting)    Secondary hyperparathyroidism of renal origin    Past Surgical History:  Procedure Laterality Date   CARDIAC CATHETERIZATION  11-17-2015   [facility]- dr Marcello Moores povsic   abnormal stress test w/ ischemia (received from nephrologist dr Justin Mend office) non-obstrucitve CAD- diffuse mid to distal LAD 60% and insignificant RCA,  stress test does not really match up with this lesion and this lesion is likely to remain insignificant   CARDIOVASCULAR STRESS TEST  10/15/2015   Intermediate risk nuclear study w/ moderate size and intensity, basal to mid inferior wall reversible perfusion defect suggestive of ischemia/  ef 57%,  inferior and inferoseptal hypokinesis to dyskinesis   CESAREAN SECTION  1987  and 1976   CYSTOSCOPY W/  RETROGRADES Bilateral 07/11/2016   Procedure: CYSTOSCOPY WITH RETROGRADE PYELOGRAM;  Surgeon: Nickie Retort, MD;  Location: [facility];  Service: Urology;  Laterality: Bilateral;   CYSTOSCOPY WITH BIOPSY Left 07/11/2016   Procedure: CYSTOSCOPY WITH  LEFT RENAL PELVIS BIOPSY;  Surgeon: Nickie Retort, MD;  Location: [facility];  Service: Urology;  Laterality: Left;   CYSTOSCOPY WITH STENT PLACEMENT Left 07/11/2016   Procedure: CYSTOSCOPY WITH STENT PLACEMENT;  Surgeon: Nickie Retort, MD;  Location: [facility];  Service: Urology;  Laterality: Left;   CYSTOSCOPY WITH URETEROSCOPY Left 07/11/2016   Procedure: CYSTOSCOPY WITH URETEROSCOPY;  Surgeon: Nickie Retort, MD;  Location: [facility];  Service: Urology;  Laterality: Left;   INSERTION OF DIALYSIS CATHETER  06-23-2015  at [facility]   Tenckhoff coiled peritoneal catheter   KIDNEY TRANSPLANT N/A 05/13/2018   Added a kidney to the bladder   NEPHRECTOMY TRANSPLANTED ORGAN     TRANSTHORACIC ECHOCARDIOGRAM  10/15/2015   poor acoustic windows limit study-  ef 50% with akinesis of the basal posterior, inferior , and lateral walls,  grade 1 diastolic dysfunction/;   WRIST ARTHROSCOPY W/ TRIANGULAR FIBROCARTILAGE REPAIR Right 03/10/2010    Allergies  Allergies  Allergen Reactions   Amoxicillin-Pot Clavulanate Nausea And Vomiting   Other     S/P Renal Transplant    Aspirin Hives   Tetracyclines & Related Hives, Nausea Only and Other (See Comments)    GI intolerance -"Messes with  stomach"      Statins Other (See Comments)    Leg pain      Labs/Other Studies Reviewed    The following studies were reviewed today: Lexiscan myoview 11/2022:   The study is normal. The study is low risk.   No ST deviation was noted.   Left ventricular function is normal. The left ventricular ejection fraction is normal (55-65%). End diastolic cavity size is normal.    Echocardiogram 11/11/22  1. Left ventricular ejection fraction, by estimation, is 50 to 55%. The  left ventricle has low normal function. The left ventricle has no regional  wall motion abnormalities. Left ventricular diastolic parameters are  consistent with Grade I diastolic  dysfunction (impaired relaxation).   2. Right ventricular systolic function is normal. The right ventricular  size is normal.   3. The mitral valve is normal in structure. No evidence of mitral valve  regurgitation. No evidence of mitral stenosis.   4. The aortic valve has an indeterminant number of cusps. There is mild  calcification of the aortic valve. There is mild thickening of the aortic  valve. Aortic valve regurgitation is not visualized. No aortic stenosis is  present.   5. The inferior vena cava is normal in size with greater than 50%  respiratory variability, suggesting right atrial pressure of 3 mmHg.    Recent Labs: 11/11/2022: ALT 10; BUN 18; Creatinine, Ser 1.27; Hemoglobin 12.3; Platelets 308; Potassium 4.2; Sodium 134  Recent Lipid Panel    Component Value Date/Time   CHOL 203 (H) 06/25/2019 1539   TRIG 119 06/25/2019 1539   HDL 50 06/25/2019 1539   CHOLHDL 4.1 06/25/2019 1539   [place] 132 (H) 06/25/2019 1539    History of Present Illness    67 year old female with the above past medical history including nonobstructive CAD, hypertension, hyperlipidemia, right renal cyst, hypothyroidism, Graves' disease, ESRD s/p renal transplant, and secondary hyperparathyroidism.  Cardiac catheterization at [facility] in 2017 showed single-vessel nonobstructive disease.  Stress echo in 2019 at [place] was normal.  She was referred to Dr. Gwenlyn Found in the setting of claudication. She was last seen in the office on 04/18/2022.  CT cardiac scoring in 07/2022 showed a coronary calcium score of 529 (96 percentile).  Perfusion study was recommended.  Lower extremity arterial duplex studies in 05/2022 in the setting of claudication  showed 50 to 74% stenosis in the superficial right femoral artery, 30 to 49% stenosis in the right peroneal artery, 30 to 49% stenosis in the superficial left femoral artery, and total occlusion of the left posterior tibial artery.  Follow-up study was recommended in 12 months. She presented to [facility] long ED on 11/10/2022 with left-sided chest pressure.  Troponin was negative.  Cardiology was consulted.  Echocardiogram showed EF 50 to 55%, no RWMA, G1 DD, normal RV systolic function, no significant valvular abnormalities.  Coronary CT angiogram was deferred in the setting of prior renal transplant.  Nuclear stress test was normal, no evidence of ischemia.  Of note, she has a history of statin intolerance she was previously referred to lipid clinic Pharm.D.  Outpatient follow-up with lipid clinic Pharm.D. was again recommended.  She was discharged home in stable condition on 11/13/2022  She presents today for follow-up.  Since her hospitalization she has been stable from a cardiac standpoint.  She denies any recurrent chest pain, denies palpitations, dyspnea.  She stopped taking her amlodipine due to concern for side effects.  She plans to discuss this with her doctor at  [facility].  She is no longer taking metoprolol due to concern for low blood pressure/HR.  She notes stable intermittent claudication.   Home Medications    Current Outpatient Medications  Medication Sig Dispense Refill   acetaminophen (TYLENOL) 650 MG CR tablet Take 1,300 mg by mouth every 8 (eight) hours as needed for pain.     amLODipine (NORVASC) 5 MG tablet Take 5 mg by mouth daily.     Cyanocobalamin (VITAMIN B-12 PO) Take 1 capsule by mouth 2 (two) times a week.     levothyroxine (SYNTHROID) 100 MCG tablet Take 100 mcg by mouth daily before breakfast.     MAGNESIUM PO Take 1 tablet by mouth 2 (two) times a week.     mycophenolate (CELLCEPT) 250 MG capsule Take 250 mg by mouth 2 (two) times daily.     nitroGLYCERIN (NITROSTAT) 0.4 MG SL  tablet Place 1 tablet (0.4 mg total) under the tongue every 5 (five) minutes as needed for chest pain. 45 tablet 3   predniSONE (DELTASONE) 5 MG tablet Take 5 mg by mouth daily with breakfast.     tacrolimus ER (ENVARSUS XR) 1 MG TB24 Take 2 mg by mouth daily.     VITAMIN D PO Take 1 capsule by mouth 2 (two) times a week.     No current facility-administered medications for this visit.     Review of Systems    She denies chest pain, palpitations, dyspnea, pnd, orthopnea, n, v, dizziness, syncope, edema, weight gain, or early satiety. All other systems reviewed and are otherwise negative except as noted above.   Physical Exam    VS:  BP 112/74 (BP Location: Left Arm, Patient Position: Sitting, Cuff Size: Large)   Pulse 78   Ht 5\' 4"  (1.626 m)   Wt 165 lb 6.4 oz (75 kg)   SpO2 95%   BMI 28.39 kg/m   GEN: Well nourished, well developed, in no acute distress. HEENT: normal. Neck: Supple, no JVD, carotid bruits, or masses. Cardiac: RRR, no murmurs, rubs, or gallops. No clubbing, cyanosis, edema.  Radials 2+ and equal bilaterally. DP/PT 1+.  Respiratory:  Respirations regular and unlabored, clear to auscultation bilaterally. GI: Soft, nontender, nondistended, BS + x 4. MS: no deformity or atrophy. Skin: warm and dry, no rash. Neuro:  Strength and sensation are intact. Psych: Normal affect.  Accessory Clinical Findings    ECG personally reviewed by me today -NSR, 78 bpm- no acute changes.   Lab Results  Component Value Date   WBC 7.6 11/11/2022   HGB 12.3 11/11/2022   HCT 38.6 11/11/2022   MCV 85.6 11/11/2022   PLT 308 11/11/2022   Lab Results  Component Value Date   CREATININE 1.27 (H) 11/11/2022   BUN 18 11/11/2022   NA 134 (L) 11/11/2022   K 4.2 11/11/2022   CL 105 11/11/2022   CO2 23 11/11/2022   Lab Results  Component Value Date   ALT 10 11/11/2022   AST 14 (L) 11/11/2022   ALKPHOS 84 11/11/2022   BILITOT 0.6 11/11/2022   Lab Results  Component Value Date    CHOL 203 (H) 06/25/2019   HDL 50 06/25/2019   LDLCALC 132 (H) 06/25/2019   TRIG 119 06/25/2019   CHOLHDL 4.1 06/25/2019    Lab Results  Component Value Date   HGBA1C 5.5 05/29/2017    Assessment & Plan    1. Nonobstructive CAD/chest pain: Cardiac catheterization at [facility] in 2017 showed single-vessel nonobstructive disease.  Stress echo  in 2019 at [place] was normal.  CT cardiac scoring in 07/2022 showed a coronary calcium score of 529 (96 percentile).  Recent hospital visit for chest pain. Nuclear stress test was normal, no evidence of ischemia, troponin was negative. She denies any recurrent chest pain. She stopped taking amlodipine de to concern for side effects. We discussed possible addition of Imdur, she declines today.  Will give prescription for prn nitroglycerin.  Discussed ED precautions.  Pending referral to lipid clinic pharmD as below.   2. Hypertension: BP well controlled. She is no longer taking her antihypertensive medications due to concern for side effects/hypotension and bradycardia.   3. Hyperlipidemia: LDL was 213 in 10/2022. Statin intolerant. Will refer to lipid clinic pharmD to discuss alternative therapies.   4. PAD: PV studies (LE arterial duplex) in 05/2022 showed mild bilateral SFA stenosis, tibial vessel disease, repeat study recommended in 12 months. Stable claudication.   5. Hypothyroidism: TSH was 0.410 in 10/2022. Monitored and managed per PCP. Continue levothyroxine.   6. CKD stage IIIb: S/p renal transplant. Creatinine was stable at 1.27 in 11/2022.   7. Disposition: Follow-up in 6 months with Dr. Gwenlyn Found.     Lenna Sciara, NP 12/06/2022, 4:23 PM

## 2022-12-06 NOTE — Patient Instructions (Addendum)
Medication Instructions:  Niroglyercin 0.4 mg SL- take as needed only.   *If you need a refill on your cardiac medications before your next appointment, please call your pharmacy*   Lab Work: NONE ordered at this time of appointment   If you have labs (blood work) drawn today and your tests are completely normal, you will receive your results only by: Reader (if you have MyChart) OR A paper copy in the mail If you have any lab test that is abnormal or we need to change your treatment, we will call you to review the results.   Testing/Procedures: NONE ordered at this time of appointment     Follow-Up: At [facility], you and your health needs are our priority.  As part of our continuing mission to provide you with exceptional heart care, we have created designated Provider Care Teams.  These Care Teams include your primary Cardiologist (physician) and Advanced Practice Providers (APPs -  Physician Assistants and Nurse Practitioners) who all work together to provide you with the care you need, when you need it.  We recommend signing up for the patient portal called "MyChart".  Sign up information is provided on this After Visit Summary.  MyChart is used to connect with patients for Virtual Visits (Telemedicine).  Patients are able to view lab/test results, encounter notes, upcoming appointments, etc.  Non-urgent messages can be sent to your provider as well.   To learn more about what you can do with MyChart, go to [email or web address].    Your next appointment:   6-7 month(s)  Provider:   Quay Burow, MD     Other Instructions Referral sent to Pharm-D

## 2023-01-15 ENCOUNTER — Emergency Department (HOSPITAL_COMMUNITY)
Admission: EM | Admit: 2023-01-15 | Discharge: 2023-01-15 | Disposition: A | Discharge status: Home / Self Care | Payer: MEDICARE | Attending: Emergency Medicine | Admitting: Emergency Medicine

## 2023-01-15 ENCOUNTER — Emergency Department (HOSPITAL_COMMUNITY): Payer: MEDICARE

## 2023-01-15 ENCOUNTER — Encounter (HOSPITAL_COMMUNITY): Payer: Self-pay

## 2023-01-15 DIAGNOSIS — R0689 Other abnormalities of breathing: Secondary | ICD-10-CM | POA: Diagnosis not present

## 2023-01-15 DIAGNOSIS — S161XXA Strain of muscle, fascia and tendon at neck level, initial encounter: Secondary | ICD-10-CM | POA: Diagnosis not present

## 2023-01-15 DIAGNOSIS — S060X1A Concussion with loss of consciousness of 30 minutes or less, initial encounter: Secondary | ICD-10-CM | POA: Diagnosis not present

## 2023-01-15 DIAGNOSIS — Z041 Encounter for examination and observation following transport accident: Secondary | ICD-10-CM | POA: Diagnosis not present

## 2023-01-15 DIAGNOSIS — R457 State of emotional shock and stress, unspecified: Secondary | ICD-10-CM | POA: Diagnosis not present

## 2023-01-15 DIAGNOSIS — R000 Tachycardia, unspecified: Secondary | ICD-10-CM | POA: Insufficient documentation

## 2023-01-15 DIAGNOSIS — S0990XA Unspecified injury of head, initial encounter: Secondary | ICD-10-CM | POA: Diagnosis present

## 2023-01-15 DIAGNOSIS — N200 Calculus of kidney: Secondary | ICD-10-CM | POA: Diagnosis not present

## 2023-01-15 DIAGNOSIS — I10 Essential (primary) hypertension: Secondary | ICD-10-CM | POA: Diagnosis not present

## 2023-01-15 DIAGNOSIS — S060X9A Concussion with loss of consciousness of unspecified duration, initial encounter: Secondary | ICD-10-CM | POA: Diagnosis not present

## 2023-01-15 DIAGNOSIS — J479 Bronchiectasis, uncomplicated: Secondary | ICD-10-CM | POA: Diagnosis not present

## 2023-01-15 LAB — CBC
HCT: 42.4 % (ref 36.0–46.0)
Hemoglobin: 13.3 g/dL (ref 12.0–15.0)
MCH: 27.1 pg (ref 26.0–34.0)
MCHC: 31.4 g/dL (ref 30.0–36.0)
MCV: 86.5 fL (ref 80.0–100.0)
Platelets: 339 10*3/uL (ref 150–400)
RBC: 4.9 MIL/uL (ref 3.87–5.11)
RDW: 13.8 % (ref 11.5–15.5)
WBC: 9.8 10*3/uL (ref 4.0–10.5)
nRBC: 0 % (ref 0.0–0.2)

## 2023-01-15 LAB — COMPREHENSIVE METABOLIC PANEL
ALT: 11 U/L (ref 0–44)
AST: 19 U/L (ref 15–41)
Albumin: 4.2 g/dL (ref 3.5–5.0)
Alkaline Phosphatase: 98 U/L (ref 38–126)
Anion gap: 12 (ref 5–15)
BUN: 13 mg/dL (ref 8–23)
CO2: 20 mmol/L — ABNORMAL LOW (ref 22–32)
Calcium: 10.1 mg/dL (ref 8.9–10.3)
Chloride: 106 mmol/L (ref 98–111)
Creatinine, Ser: 1.37 mg/dL — ABNORMAL HIGH (ref 0.44–1.00)
GFR, Estimated: 43 mL/min — ABNORMAL LOW (ref >60)
Glucose, Bld: 92 mg/dL (ref 70–99)
Potassium: 4.4 mmol/L (ref 3.5–5.1)
Sodium: 138 mmol/L (ref 135–145)
Total Bilirubin: 0.3 mg/dL (ref 0.3–1.2)
Total Protein: 7.8 g/dL (ref 6.5–8.1)

## 2023-01-15 LAB — I-STAT CHEM 8, ED
BUN: 15 mg/dL (ref 8–23)
Calcium, Ion: 1.18 mmol/L (ref 1.15–1.40)
Chloride: 109 mmol/L (ref 98–111)
Creatinine, Ser: 1.3 mg/dL — ABNORMAL HIGH (ref 0.44–1.00)
Glucose, Bld: 91 mg/dL (ref 70–99)
HCT: 42 % (ref 36.0–46.0)
Hemoglobin: 14.3 g/dL (ref 12.0–15.0)
Potassium: 4.4 mmol/L (ref 3.5–5.1)
Sodium: 139 mmol/L (ref 135–145)
TCO2: 20 mmol/L — ABNORMAL LOW (ref 22–32)

## 2023-01-15 LAB — ETHANOL: Alcohol, Ethyl (B): <10 mg/dL (ref <10)

## 2023-01-15 MED ORDER — ACETAMINOPHEN 325 MG PO TABS
650.0000 mg | ORAL_TABLET | Freq: Once | ORAL | Status: AC
  Administered 2023-01-15: 650 mg via ORAL
  Filled 2023-01-15: qty 2
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
## 2023-01-15 NOTE — Discharge Instructions (Addendum)
If you develop new or worsening headache, neck pain, numbness or weakness, speech difficulties, vision abnormalities, problems, chest pain, trouble breathing, or any other new/concerning symptoms and return to the ER or call 911.
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
## 2023-01-15 NOTE — ED Notes (Signed)
Patient transported to CT 

## 2023-01-17 DIAGNOSIS — H40013 Open angle with borderline findings, low risk, bilateral: Secondary | ICD-10-CM | POA: Diagnosis not present

## 2023-01-22 ENCOUNTER — Ambulatory Visit
Payer: MEDICARE | Attending: Cardiovascular Disease | Admitting: Pharmacist Clinician (PhC)/ Clinical Pharmacy Specialist

## 2023-01-22 ENCOUNTER — Encounter: Payer: Self-pay | Admitting: Pharmacist Clinician (PhC)/ Clinical Pharmacy Specialist

## 2023-01-22 DIAGNOSIS — E782 Mixed hyperlipidemia: Secondary | ICD-10-CM | POA: Diagnosis not present

## 2023-01-22 DIAGNOSIS — G720 Drug-induced myopathy: Secondary | ICD-10-CM | POA: Diagnosis not present

## 2023-01-22 DIAGNOSIS — T466X5A Adverse effect of antihyperlipidemic and antiarteriosclerotic drugs, initial encounter: Secondary | ICD-10-CM

## 2023-01-22 NOTE — Patient Instructions (Signed)
Your Results:             Your most recent labs Goal  Total Cholesterol 275 < 200  Triglycerides 131 < 150  HDL (happy/good cholesterol) 39 > 40  LDL (lousy/bad cholesterol 213 < 70   Medication changes:  Please reach out to me when you decide which medication you would like to try.  We can do the Praluent injections every 2 weeks or try the Leqvio injections (every 3 months x 2 doses, then every 6 months).  Lab orders:  We want to repeat labs after 2-3 months.  We will send you a lab order to remind you once we get closer to that time.    Patient Assistance:  The [employer] foundation offers assistance to help pay for medication copays.  They will cover copays for all cholesterol lowering meds, including statins, fibrates, omega-3 oils, ezetimibe, Repatha, Praluent, Nexletol, Nexlizet.  The cards are usually good for $2,500 or 12 months, whichever comes first. Go to healthwellfoundation.org Click on "Apply Now" Answer questions as to whom is applying (patient or representative) Your disease fund will be "hypercholesterolemia - Medicare access" Select the cholesterol medication you need assistance with (Repatha, Praluent, Nexlizet...) They will ask question about qualifying diagnosis - you can mark "yes"; and do you have insurance coverage.   When they ask what type of assistance you are interested in - "copay assistance" When you submit, the approval is usually within minutes.  You will need to print the card information from the site You will need to show this information to your pharmacy, they will bill your Medicare Part D plan first -then bill [employer] --for the copay.   You can also call them at [phone], although the hold times can be quite long.   Thank you for choosing [facility]

## 2023-01-22 NOTE — Progress Notes (Signed)
Office Visit    Patient Name: Yvette Jenkins Date of Encounter: 01/22/2023  Primary Care Provider:  Laurann Montana, MD Primary Cardiologist:  Nanetta Batty, MD  Chief Complaint    Hyperlipidemia - familial  Significant Past Medical History   CKD S/P renal transplant, creatinine stable, on mycophenolate 250 mg bid, tacrolimus ER 2 mg qd  CAD Calcium score 529 (96th percientile)   Vit D deficiency On supplementation  hypothyroidism On levothyroxine     Allergies  Allergen Reactions   Amoxicillin-Pot Clavulanate Nausea And Vomiting   Other     S/P Renal Transplant    Aspirin Hives   Tetracyclines & Related Hives, Nausea Only and Other (See Comments)    GI intolerance -"Messes with stomach"      Statins Other (See Comments)    Leg pain     History of Present Illness    Yvette Jenkins is a 67 y.o. female patient of Dr Allyson Sabal, in the office today to discuss options for cholesterol management.  She was previously seen in [facility] last September by Laural Golden PharmD.  She was given prescription for Praluent 75 mg, and admitted reluctance to taking injections.  She has a history of renal transplant and notes that she is tired of all the needles.  Since her lab work in February, which showed LDL to be at 213, she has cut out red meat and started eating more salads and fruit smoothies.    Insurance Carrier:  [employer]  LDL Cholesterol goal:  LDL < 70  Current Medications:  none   Previously tried:  atorvastatin, pravastatin  Family Hx:   father unknown; mother had kidney disease, has 5 children, none with known familial hyperlipidemia  Social Hx: Tobacco: no Alcohol:  occasional wine  Diet:    mostly home cooked meals; no added salt, chicken and fish; vegetables fresh or frozen  Exercise: stretching and bicycle/treadmill twice weekly  Adherence Assessment  Do you ever forget to take your medication? [] Yes [x] No  Do you ever  skip doses due to side effects? [] Yes [x] No  Do you have trouble affording your medicines? [] Yes [x] No  Are you ever unable to pick up your medication due to transportation difficulties? [] Yes [x] No  Do you ever stop taking your medications because you don't believe they are helping? [] Yes [x] No   Adherence strategy: 7 day pill minder   Accessory Clinical Findings  10/2022 Labs from [facility] - TC 275, TG 131, HDL 39, LDL 213  Lab Results  Component Value Date   CHOL 203 (H) 06/25/2019   HDL 50 06/25/2019   LDLCALC 132 (H) 06/25/2019   TRIG 119 06/25/2019   CHOLHDL 4.1 06/25/2019    Lab Results  Component Value Date   ALT 11 01/15/2023   AST 19 01/15/2023   ALKPHOS 98 01/15/2023   BILITOT 0.3 01/15/2023   Lab Results  Component Value Date   CREATININE 1.30 (H) 01/15/2023   BUN 15 01/15/2023   NA 139 01/15/2023   K 4.4 01/15/2023   CL 109 01/15/2023   CO2 20 (L) 01/15/2023   Lab Results  Component Value Date   HGBA1C 5.5 05/29/2017    Home Medications    Current Outpatient Medications  Medication Sig Dispense Refill   amLODipine (NORVASC) 5 MG tablet Take 5 mg by mouth daily.     Cyanocobalamin (VITAMIN B-12 PO) Take 1 capsule by mouth 2 (two) times a week.  levothyroxine (SYNTHROID) 100 MCG tablet Take 100 mcg by mouth daily before breakfast.     MAGNESIUM PO Take 1 tablet by mouth 2 (two) times a week.     mycophenolate (CELLCEPT) 250 MG capsule Take 250 mg by mouth 2 (two) times daily.     nitroGLYCERIN (NITROSTAT) 0.4 MG SL tablet Place 1 tablet (0.4 mg total) under the tongue every 5 (five) minutes as needed for chest pain. 45 tablet 3   tacrolimus ER (ENVARSUS XR) 1 MG TB24 Take 2 mg by mouth daily.     VITAMIN D PO Take 1 capsule by mouth 2 (two) times a week.     No current facility-administered medications for this visit.     Assessment & Plan    Hyperlipidemia Assessment: Patient with ASCVD and familial hyperlipidemia not at LDL goal  of < 70 Most recent LDL 213 in February 2024 Not able to tolerate statins secondary to myalgias Received 3 month supply of Praluent 05/2022, but has fear of needles and has never used Reviewed options for lowering LDL cholesterol, including PCSK-9 inhibitors, bempedoic acid and inclisiran.  Discussed mechanisms of action, dosing, side effects, potential decreases in LDL cholesterol and costs.  Also reviewed potential options for patient assistance. Not interested in Nexlizet 2/2 prior history of gout. Plan: Patient would like to think about options of alirocumab vs inclisiran Will reach out to me once she has made a decision Repeat labs after:  3 months (alirocumab) or 4 months (inclisiran) Lipid Liver function Patient was given information on [employer] grant/signed up for [employer] while in office today. She signed [facility] service center form should she choose that route.    Phillips Hay, PharmD CPP [facility] [address]  [place], [place] [address] [phone]  01/22/2023, 4:40 PM

## 2023-01-22 NOTE — Assessment & Plan Note (Addendum)
Assessment: Patient with ASCVD and familial hyperlipidemia not at LDL goal of < 70 Most recent LDL 213 in February 2024 Not able to tolerate statins secondary to myalgias Received 3 month supply of Praluent 05/2022, but has fear of needles and has never used Reviewed options for lowering LDL cholesterol, including PCSK-9 inhibitors, bempedoic acid and inclisiran.  Discussed mechanisms of action, dosing, side effects, potential decreases in LDL cholesterol and costs.  Also reviewed potential options for patient assistance. Not interested in Nexlizet 2/2 prior history of gout. Plan: Patient would like to think about options of alirocumab vs inclisiran Will reach out to me once she has made a decision Repeat labs after:  3 months (alirocumab) or 4 months (inclisiran) Lipid Liver function Patient was given information on [employer] grant/signed up for [employer] while in office today. She signed [facility] service center form should she choose that route.

## 2023-01-24 ENCOUNTER — Encounter (HOSPITAL_COMMUNITY): Payer: Self-pay

## 2023-01-26 DIAGNOSIS — Z940 Kidney transplant status: Secondary | ICD-10-CM | POA: Diagnosis not present

## 2023-01-26 DIAGNOSIS — Z5181 Encounter for therapeutic drug level monitoring: Secondary | ICD-10-CM | POA: Diagnosis not present

## 2023-01-30 DIAGNOSIS — R1032 Left lower quadrant pain: Secondary | ICD-10-CM | POA: Diagnosis not present

## 2023-02-09 DIAGNOSIS — E663 Overweight: Secondary | ICD-10-CM | POA: Diagnosis not present

## 2023-02-09 DIAGNOSIS — R011 Cardiac murmur, unspecified: Secondary | ICD-10-CM | POA: Diagnosis not present

## 2023-02-09 DIAGNOSIS — I10 Essential (primary) hypertension: Secondary | ICD-10-CM | POA: Diagnosis not present

## 2023-02-09 DIAGNOSIS — E785 Hyperlipidemia, unspecified: Secondary | ICD-10-CM | POA: Diagnosis not present

## 2023-02-09 DIAGNOSIS — N186 End stage renal disease: Secondary | ICD-10-CM | POA: Diagnosis not present

## 2023-02-09 DIAGNOSIS — Z6828 Body mass index (BMI) 28.0-28.9, adult: Secondary | ICD-10-CM | POA: Diagnosis not present

## 2023-02-09 DIAGNOSIS — E0590 Thyrotoxicosis, unspecified without thyrotoxic crisis or storm: Secondary | ICD-10-CM | POA: Diagnosis not present

## 2023-04-23 DIAGNOSIS — I10 Essential (primary) hypertension: Secondary | ICD-10-CM | POA: Diagnosis not present

## 2023-04-23 DIAGNOSIS — D849 Immunodeficiency, unspecified: Secondary | ICD-10-CM | POA: Diagnosis not present

## 2023-04-23 DIAGNOSIS — Z940 Kidney transplant status: Secondary | ICD-10-CM | POA: Diagnosis not present

## 2023-04-23 DIAGNOSIS — E039 Hypothyroidism, unspecified: Secondary | ICD-10-CM | POA: Diagnosis not present

## 2023-04-23 DIAGNOSIS — I739 Peripheral vascular disease, unspecified: Secondary | ICD-10-CM | POA: Diagnosis not present

## 2023-05-18 DIAGNOSIS — Z5181 Encounter for therapeutic drug level monitoring: Secondary | ICD-10-CM | POA: Diagnosis not present

## 2023-05-18 DIAGNOSIS — Z940 Kidney transplant status: Secondary | ICD-10-CM | POA: Diagnosis not present

## 2023-05-29 DIAGNOSIS — Z940 Kidney transplant status: Secondary | ICD-10-CM | POA: Diagnosis not present

## 2023-05-29 DIAGNOSIS — D849 Immunodeficiency, unspecified: Secondary | ICD-10-CM | POA: Diagnosis not present

## 2023-05-29 DIAGNOSIS — R413 Other amnesia: Secondary | ICD-10-CM | POA: Diagnosis not present

## 2023-05-29 DIAGNOSIS — E039 Hypothyroidism, unspecified: Secondary | ICD-10-CM | POA: Diagnosis not present

## 2023-05-29 DIAGNOSIS — F419 Anxiety disorder, unspecified: Secondary | ICD-10-CM | POA: Diagnosis not present

## 2023-05-29 DIAGNOSIS — I2510 Atherosclerotic heart disease of native coronary artery without angina pectoris: Secondary | ICD-10-CM | POA: Diagnosis not present

## 2023-05-29 DIAGNOSIS — I10 Essential (primary) hypertension: Secondary | ICD-10-CM | POA: Diagnosis not present

## 2023-05-29 DIAGNOSIS — E785 Hyperlipidemia, unspecified: Secondary | ICD-10-CM | POA: Diagnosis not present

## 2023-05-29 DIAGNOSIS — I700 Atherosclerosis of aorta: Secondary | ICD-10-CM | POA: Diagnosis not present

## 2023-06-02 ENCOUNTER — Other Ambulatory Visit: Payer: Self-pay | Admitting: Family Medicine

## 2023-06-02 DIAGNOSIS — Z1231 Encounter for screening mammogram for malignant neoplasm of breast: Secondary | ICD-10-CM

## 2023-06-07 DIAGNOSIS — I25118 Atherosclerotic heart disease of native coronary artery with other forms of angina pectoris: Secondary | ICD-10-CM | POA: Diagnosis not present

## 2023-06-21 DIAGNOSIS — I129 Hypertensive chronic kidney disease with stage 1 through stage 4 chronic kidney disease, or unspecified chronic kidney disease: Secondary | ICD-10-CM | POA: Diagnosis not present

## 2023-06-21 DIAGNOSIS — N1832 Chronic kidney disease, stage 3b: Secondary | ICD-10-CM | POA: Diagnosis not present

## 2023-06-21 DIAGNOSIS — Z881 Allergy status to other antibiotic agents status: Secondary | ICD-10-CM | POA: Diagnosis not present

## 2023-06-21 DIAGNOSIS — I2510 Atherosclerotic heart disease of native coronary artery without angina pectoris: Secondary | ICD-10-CM | POA: Diagnosis not present

## 2023-06-21 DIAGNOSIS — D631 Anemia in chronic kidney disease: Secondary | ICD-10-CM | POA: Diagnosis not present

## 2023-06-21 DIAGNOSIS — E039 Hypothyroidism, unspecified: Secondary | ICD-10-CM | POA: Diagnosis not present

## 2023-06-21 DIAGNOSIS — K5730 Diverticulosis of large intestine without perforation or abscess without bleeding: Secondary | ICD-10-CM | POA: Diagnosis not present

## 2023-06-21 DIAGNOSIS — Z886 Allergy status to analgesic agent status: Secondary | ICD-10-CM | POA: Diagnosis not present

## 2023-06-21 DIAGNOSIS — D849 Immunodeficiency, unspecified: Secondary | ICD-10-CM | POA: Diagnosis not present

## 2023-06-21 DIAGNOSIS — Z888 Allergy status to other drugs, medicaments and biological substances status: Secondary | ICD-10-CM | POA: Diagnosis not present

## 2023-06-21 DIAGNOSIS — Z940 Kidney transplant status: Secondary | ICD-10-CM | POA: Diagnosis not present

## 2023-06-21 DIAGNOSIS — Z1211 Encounter for screening for malignant neoplasm of colon: Secondary | ICD-10-CM | POA: Diagnosis not present

## 2023-07-05 DIAGNOSIS — F4323 Adjustment disorder with mixed anxiety and depressed mood: Secondary | ICD-10-CM | POA: Diagnosis not present

## 2023-07-19 ENCOUNTER — Inpatient Hospital Stay: Admission: RE | Admit: 2023-07-19 | Payer: MEDICARE | Source: Ambulatory Visit

## 2023-08-04 DIAGNOSIS — F4323 Adjustment disorder with mixed anxiety and depressed mood: Secondary | ICD-10-CM | POA: Diagnosis not present

## 2023-08-31 DIAGNOSIS — M47812 Spondylosis without myelopathy or radiculopathy, cervical region: Secondary | ICD-10-CM | POA: Diagnosis not present

## 2023-08-31 DIAGNOSIS — F419 Anxiety disorder, unspecified: Secondary | ICD-10-CM | POA: Diagnosis not present

## 2023-08-31 DIAGNOSIS — M542 Cervicalgia: Secondary | ICD-10-CM | POA: Diagnosis not present

## 2023-08-31 DIAGNOSIS — F0781 Postconcussional syndrome: Secondary | ICD-10-CM | POA: Diagnosis not present

## 2023-08-31 DIAGNOSIS — R413 Other amnesia: Secondary | ICD-10-CM | POA: Diagnosis not present

## 2023-08-31 DIAGNOSIS — Z87828 Personal history of other (healed) physical injury and trauma: Secondary | ICD-10-CM | POA: Diagnosis not present

## 2023-09-03 ENCOUNTER — Encounter (HOSPITAL_COMMUNITY): Payer: Self-pay

## 2023-09-03 ENCOUNTER — Other Ambulatory Visit: Payer: Self-pay | Admitting: Family Medicine

## 2023-09-03 DIAGNOSIS — F0781 Postconcussional syndrome: Secondary | ICD-10-CM

## 2023-09-03 DIAGNOSIS — R413 Other amnesia: Secondary | ICD-10-CM

## 2023-09-04 ENCOUNTER — Ambulatory Visit
Admission: RE | Admit: 2023-09-04 | Discharge: 2023-09-04 | Disposition: A | Discharge status: Home / Self Care | Payer: MEDICARE | Source: Ambulatory Visit | Attending: Family Medicine | Admitting: Family Medicine

## 2023-09-04 DIAGNOSIS — F0781 Postconcussional syndrome: Secondary | ICD-10-CM

## 2023-09-04 DIAGNOSIS — F4323 Adjustment disorder with mixed anxiety and depressed mood: Secondary | ICD-10-CM | POA: Diagnosis not present

## 2023-09-04 DIAGNOSIS — R410 Disorientation, unspecified: Secondary | ICD-10-CM | POA: Diagnosis not present

## 2023-09-04 DIAGNOSIS — R413 Other amnesia: Secondary | ICD-10-CM

## 2023-09-04 DIAGNOSIS — R519 Headache, unspecified: Secondary | ICD-10-CM | POA: Diagnosis not present

## 2023-09-12 ENCOUNTER — Other Ambulatory Visit: Payer: Self-pay | Admitting: Obstetrics & Gynecology

## 2023-09-12 DIAGNOSIS — R928 Other abnormal and inconclusive findings on diagnostic imaging of breast: Secondary | ICD-10-CM

## 2023-09-25 ENCOUNTER — Encounter (HOSPITAL_COMMUNITY): Payer: Self-pay

## 2023-10-02 ENCOUNTER — Ambulatory Visit
Admission: RE | Admit: 2023-10-02 | Discharge: 2023-10-02 | Disposition: A | Discharge status: Home / Self Care | Payer: Medicare (Managed Care) | Source: Ambulatory Visit | Attending: Obstetrics & Gynecology | Admitting: Obstetrics & Gynecology

## 2023-10-02 ENCOUNTER — Encounter (HOSPITAL_COMMUNITY): Payer: Self-pay

## 2023-10-02 ENCOUNTER — Ambulatory Visit: Admission: RE | Admit: 2023-10-02 | Payer: MEDICARE | Source: Ambulatory Visit

## 2023-10-02 DIAGNOSIS — R928 Other abnormal and inconclusive findings on diagnostic imaging of breast: Secondary | ICD-10-CM
